# Patient Record
Sex: Female | Born: 1985 | Race: White | Hispanic: No | Marital: Married | Smoking: Never smoker
Health system: Southern US, Community
[De-identification: ages and names within clinical notes are randomized; demographics above are authoritative.]

## PROBLEM LIST (undated history)

## (undated) DIAGNOSIS — R112 Nausea with vomiting, unspecified: Secondary | ICD-10-CM

## (undated) DIAGNOSIS — C439 Malignant melanoma of skin, unspecified: Secondary | ICD-10-CM

## (undated) DIAGNOSIS — Z800 Family history of malignant neoplasm of digestive organs: Secondary | ICD-10-CM

## (undated) DIAGNOSIS — Z801 Family history of malignant neoplasm of trachea, bronchus and lung: Secondary | ICD-10-CM

## (undated) DIAGNOSIS — C50919 Malignant neoplasm of unspecified site of unspecified female breast: Secondary | ICD-10-CM

## (undated) DIAGNOSIS — Z1371 Encounter for nonprocreative screening for genetic disease carrier status: Secondary | ICD-10-CM

## (undated) DIAGNOSIS — Z9889 Other specified postprocedural states: Secondary | ICD-10-CM

## (undated) DIAGNOSIS — Z803 Family history of malignant neoplasm of breast: Secondary | ICD-10-CM

## (undated) HISTORY — PX: WISDOM TOOTH EXTRACTION: SHX21

## (undated) HISTORY — DX: Family history of malignant neoplasm of breast: Z80.3

## (undated) HISTORY — DX: Family history of malignant neoplasm of trachea, bronchus and lung: Z80.1

## (undated) HISTORY — DX: Family history of malignant neoplasm of digestive organs: Z80.0

---
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
## 2006-02-25 ENCOUNTER — Other Ambulatory Visit: Admission: RE | Admit: 2006-02-25 | Discharge: 2006-02-25 | Payer: Self-pay | Admitting: Family Medicine

## 2006-03-11 ENCOUNTER — Ambulatory Visit (HOSPITAL_COMMUNITY): Admission: RE | Admit: 2006-03-11 | Discharge: 2006-03-11 | Payer: Self-pay | Admitting: Family Medicine

## 2007-01-13 HISTORY — PX: OTHER SURGICAL HISTORY: SHX169

## 2007-10-14 DIAGNOSIS — C439 Malignant melanoma of skin, unspecified: Secondary | ICD-10-CM

## 2007-10-14 HISTORY — DX: Malignant melanoma of skin, unspecified: C43.9

## 2010-01-30 ENCOUNTER — Ambulatory Visit
Admission: RE | Admit: 2010-01-30 | Discharge: 2010-01-30 | Payer: Self-pay | Source: Home / Self Care | Attending: Sports Medicine | Admitting: Sports Medicine

## 2010-01-30 DIAGNOSIS — M629 Disorder of muscle, unspecified: Secondary | ICD-10-CM | POA: Insufficient documentation

## 2010-01-30 DIAGNOSIS — M25569 Pain in unspecified knee: Secondary | ICD-10-CM | POA: Insufficient documentation

## 2010-01-30 DIAGNOSIS — M2170 Unequal limb length (acquired), unspecified site: Secondary | ICD-10-CM | POA: Insufficient documentation

## 2010-01-30 DIAGNOSIS — R269 Unspecified abnormalities of gait and mobility: Secondary | ICD-10-CM | POA: Insufficient documentation

## 2010-02-13 NOTE — Assessment & Plan Note (Signed)
Summary: 3:15APP,NP,KNEE/NECK PAIN,MC   Vital Signs:  Patient profile:   25 year old female Height:      66 inches Weight:      133 pounds BMI:     21.54 Pulse rate:   64 / minute BP sitting:   100 / 68  (left arm)  Vitals Entered By: Rochele Pages RN (January 30, 2010 3:30 PM) CC: lt lateral knee pain, rt side neck pain   CC:  lt lateral knee pain and rt side neck pain.  History of Present Illness: R knee pain started during a run on Dec 23. Pain started after normal 3 miles. Ran a total of 4.6 miles. Pain for a few days afterwards. Rested for about 3 1/2 weeks. Tried to run again this past Sat but pain returned.   Normally runs 2-3 times/week, about 3 miles each time. Been a runner for about 2 years. No previous injuries.  Pain starts when she gets further into runs.  Able to get through Zumba class without significant pain. Weight bearing on R side causes much pain.  Meds: Relafen, Voltoren gel--No difference X-Rays: Normal  Preventive Screening-Counseling & Management  Alcohol-Tobacco     Smoking Status: quit  Social History: Smoking Status:  quit  Physical Exam  General:  Well-developed,well-nourished,in no acute distress; alert,appropriate and cooperative throughout examination Msk:  No gross abnormalities L Leg length=88.2cm R Leg length=90cm Mild TTP of R lateral knee joint R Leg Anterior Drawer test with laxity Mild R lateral knee joint effusion Negative patellar grind Negative patellofemoral tenderness Additional Exam:  When assessing patient while walking and jogging, mild L sided pelvic tilt was appreciated.   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) consistent with her findings and we can actually feel some edema under distal ITB today over femoral prominence  will use stretches and  exercises however big change seems to be leg length  Problem # 3:  UNEQUAL LEG LENGTH (ICD-736.81) will add lift to left this does improve and eliminate some of trendelenburg during gait since she gets RT neck and TRAP spasm I think she needs to use lift in reg shoes I did not see scoliosis or abnorm neck exam  Problem # 4:  ABNORMALITY OF GAIT (ICD-781.2) overall she has good runnign form however, leg length difference is enough to throw off gait to lead to trendelenburg and prob causing ITB sxs  may be candidate for custom orthotics going forward  reck 4 wks  Patient Instructions: 1)  Perform at least 2 of the stretching exercises 3 reps of 30 seconds each at least once a day. 2)  Perform the leg exercises 3 sets of 15 reps at least once a day. 3)  Return to running by starting off with 2 mile walk/runs, then gradually increase after 2 weeks. 4)  Briefly apply ice to affected area if experiencing pain after running or other exercises. 5)  Follow up in 1 month for re-evaluation.   Orders Added: 1)  New Patient Level III [36644]

## 2010-03-03 ENCOUNTER — Ambulatory Visit: Payer: Self-pay | Admitting: Sports Medicine

## 2010-03-19 ENCOUNTER — Encounter: Payer: Self-pay | Admitting: Sports Medicine

## 2010-03-19 ENCOUNTER — Ambulatory Visit (INDEPENDENT_AMBULATORY_CARE_PROVIDER_SITE_OTHER): Payer: 59 | Admitting: Sports Medicine

## 2010-03-19 DIAGNOSIS — M2170 Unequal limb length (acquired), unspecified site: Secondary | ICD-10-CM

## 2010-03-19 DIAGNOSIS — R269 Unspecified abnormalities of gait and mobility: Secondary | ICD-10-CM

## 2010-03-19 DIAGNOSIS — M629 Disorder of muscle, unspecified: Secondary | ICD-10-CM

## 2010-03-25 NOTE — Assessment & Plan Note (Signed)
Summary: F/U [facility]   Vital Signs:  Patient profile:   25 year old female Pulse rate:   67 / minute BP sitting:   119 / 82  (left arm)  Vitals Entered By: Rochele Pages RN (March 19, 2010 8:56 AM)  CC:  R knee pain.  History of Present Illness: [by Christin Gethers MSIV]  Ms. Name is here today for a F/U appointment for her R knee pain caused by ITBS and leg length discrepancy .  She reports that the shoe insert she recieved last visit has helped significantly. She has slowly started running again. Last week ran 3 miles and pushed herself past the point of pain and her ITBS flared up again. Patient denies experiencing pain during Zumba. She started taking fish oil and thinks it's been helping with her knee pain. The patient has been following the exercises and stretches that she received the last visit. Patient has decreased running to only 2 times a week. On occasion she uses ice after running, she does not take any antinflammatory medicine for the pain.  Problems Prior to Update: 1)  Abnormality of Gait  (ICD-781.2) 2)  Unequal Leg Length  (ICD-736.81) 3)  Itbs, Right Knee  (ICD-728.89) 4)  Knee Pain, Right  (ICD-719.46)  Review of Systems       per HPI  Physical Exam  General:  alert, normal appearance, healthy-appearing, and cooperative to examination.  alert, normal appearance, healthy-appearing, and cooperative to examination.   Msk:  Right Knee: Normal to inspection with no erythema or effusion or obvious bony abnormalities. Palpation normal with no warmth or joint line tenderness. ROM normal in flexion and extension. Ligaments with solid consistent endpoints including ACL & LCL. Negative Mcmurray's and provocative meniscal tests. Hip abduction 5/5. Leg length discrepancy: left leg is shorter than right leg.  note the edema noted at ITB on last visit has resolved  Extremities:  running gait shows overall good form when we correct for leg length.  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 Custom orthotics are being made today to help correct leg length discrepancy and should also help reduce her IT band pain Advised to continue with exercise and stretching program.  Orders: Garment,belt,sleeve or other covering ,elastic or similar stretch (Z6109) [employer], each unit (U0454)  Problem # 2:  UNEQUAL LEG LENGTH (ICD-736.81)  Reports significant improvement since her last visit. She is having custom orthotics made today to help correct for unequal leg length Observed running form with lift: form was good, with appropriate shifting of hips. Patient was fitted for a : standard, cushioned, semi-rigid orthotic. The orthotic was heated and afterward the patient stood on the orthotic blank positioned on the orthotic stand. The patient was positioned in subtalar neutral position and 10 degrees of ankle dorsiflexion in a weight bearing stance. After completion of molding, a stable base was applied to the orthotic blank. The blank was ground to a stable position for weight bearing. Size: 8 blue swirl Base: EVA on rt, 2 layers of EVA on lt to correct leg length difference Posting: none Additional orthotic padding: none  note that gait after orthotic prep looks very neutral and the inserts fell comfortable to patient  reck 2 to 3 months pending response  Orders: [employer], each unit (U9811)  Problem # 3:  ABNORMALITY OF  GAIT (ICD-781.2)  Orders: [employer], each unit [phone])   corrects well with lift for leg length difference with no trendelenburg change noted after this  Patient Instructions: 1)  Continue performing at least 2 of the stretching exercises 3 reps of 30 seconds each at least once a day. 2)  Continue performing the leg exercises 3 sets of 15 reps at least once a  day. 3)  Briefly apply ice to affected area if experiencing pain after running or other exercises. 4)  Wear orthotics in running shoes when exercising.   Orders Added: 1)  Garment,belt,sleeve or other covering ,elastic or similar stretch [A4466] 2)  Est. Patient Level IV [98119] 3)  Or[employer]otic [employer], each unit [L3002]

## 2011-08-10 ENCOUNTER — Ambulatory Visit (HOSPITAL_COMMUNITY)
Admission: RE | Admit: 2011-08-10 | Discharge: 2011-08-10 | Disposition: A | Discharge status: Home / Self Care | Payer: 59 | Source: Ambulatory Visit | Attending: Family Medicine | Admitting: Family Medicine

## 2011-08-10 DIAGNOSIS — M542 Cervicalgia: Secondary | ICD-10-CM | POA: Insufficient documentation

## 2011-08-10 DIAGNOSIS — IMO0001 Reserved for inherently not codable concepts without codable children: Secondary | ICD-10-CM | POA: Insufficient documentation

## 2011-08-10 DIAGNOSIS — M6281 Muscle weakness (generalized): Secondary | ICD-10-CM | POA: Insufficient documentation

## 2011-08-10 DIAGNOSIS — M7918 Myalgia, other site: Secondary | ICD-10-CM | POA: Insufficient documentation

## 2011-08-10 NOTE — Evaluation (Signed)
Physical Therapy Evaluation  Patient Details  Name: Stephanie [name] MRN: [id number] Date of Birth: [birth year]  Today's Date: 08/10/2011 Time: 1705-1750 PT Time Calculation (min): 45 min Charges: 1 eval, 10' Manual  Visit#: 1  of 8   Re-eval: 09/09/11 Assessment Diagnosis: Neck Pain Next MD Visit: Dr. Neita Carp - unscheduled Prior Therapy: None - chiropractic    Past Medical History: R shoulder melanoma (2009) Past Surgical History: R shoulder melanoma removal (2009)  Subjective Symptoms/Limitations Symptoms: Neck Pain, melenoma to L shoulder 2009 Pertinent History: Pt is referred to PT for chronic neck pain. She reports that when she had her melenoma removed in 2009 and was favoring her RUE and holding her R shoulder close to her body in order to keep stiches from ripping.  She has recieved help from a chiropractor without relief and has had 3 x-rays w/o significant findings. Her c/co's: difficulty turning her neck to talk to people, has multiple flare ups that makes her nausea, attacks last 1-2 days.  She reports increased anxiety over the past few years and has had increased headaches over the last month. She has recently changed jobs in her company which she reports has also increase Special Tests: - compression, - distraction.  Pt reports decreased pain with distraction technique, but does not report any radicular symptoms.  Pain Assessment Currently in Pain?: Yes Pain Score:   3 (Range: 3-10/10.  Worse with attacks.) Pain Type: Chronic pain Pain Onset: More than a month ago  Prior Functioning  Prior Function Driving: Yes Vocation: Full time employment Vocation Requirements: Full time working at the [employer] on the side.  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  Enjoys being outside.   Cognition/Observation Observation/Other Assessments Observations: Is aware of  posture during exam and continue to self correct Other Assessments: Decreaed levator scapula and UT muscle length  Sensation/Coordination/Flexibility/Functional Tests Coordination Gross Motor Movements are Fluid and Coordinated: No Coordination and Movement Description: Impaired coordination to R MT and LT region w/increased activiation of R UT  Assessment Cervical AROM Cervical Flexion: WNL Cervical Extension: WNL - no pain today, has increased pain during flare ups Cervical - Right Side Bend: 15 cm Cervical - Left Side Bend: 18 cm - increased stiffness Cervical - Right Rotation: WNL - no pain Cervical - Left Rotation: WNL - no pain Cervical Strength Overall Cervical Strength Comments: capital flexion 3+/5 Cervical Flexion: 3+/5 Cervical Extension: 4/5 Cervical - Right Side Bend: 4/5 Cervical - Left Side Bend: 3+/5 Cervical - Right Rotation: 4/5 Cervical - Left Rotation: 4/5 Palpation Palpation: Decreased PA mobility to C2, C4-C5 SP.  Increased fascial restrcitions to R and L suboccipital region, R scalenes and cervical paraspinal musculature.      Posture: No significant limitations   Exercise/Treatments Stretches Upper Trapezius Stretch: 1 rep;30 seconds Levator Stretch: 1 rep;30 seconds Seated Exercises Neck Retraction: 5 reps Supine Exercises Other Supine Exercise: Diaphragmatic breathing with manual techniques.   Manual Therapy Manual Therapy: Other (comment) Joint Mobilization: Grade II PA to C2, C4-5 Spinous process to decrease pain and improve mobility.  Myofascial Release: suboccipital release; R levator scapula and insertion of SCM.  Other Manual Therapy: Cervical traction   Physical Therapy Assessment and Plan PT Assessment and Plan Clinical Impression Statement: Pt is a 26 year old female referred to PT for chronic neck pain.  Pt reports decreased pain at end of session today.  Demonstrate independence and  appropriate coordination with chin tucks.  Pt will  benefit from skilled therapeutic intervention in order to improve on the following deficits: Pain;Increased muscle spasms;Increased fascial restricitons;Decreased strength;Decreased range of motion;Impaired flexibility;Decreased coordination Rehab Potential: Good PT Frequency: Min 2X/week PT Duration: 4 weeks PT Treatment/Interventions: Therapeutic activities;Modalities;Therapeutic exercise;Neuromuscular re-education;Patient/family education;Manual techniques (Traction modalities only.  Hx of cancer) PT Plan: Hx of cancer ( no electrical modalities).  Start with UBE, prone shoulder exercises, x-v, w backs, theraband, manual techniques,  attempt cervical traction if pt responded well today.     Goals Home Exercise Program Pt will Perform Home Exercise Program: Independently PT Goal: Perform Home Exercise Program - Progress: Goal set today PT Short Term Goals Time to Complete Short Term Goals: 4 weeks PT Short Term Goal 1: Pt will report pain less than 2/10 for 75% of her day for improved QOL. PT Short Term Goal 2: Pt will improve cervical and shoulder strength to [facility] in order to continue with hunting and work out activities without an increase in pain.  PT Short Term Goal 3: Pt will improve cervical SP PA mobility to decrease risk of pain.  PT Short Term Goal 4: Pt will improve cervical musculature muscle length/flexibility in order to have decreased pain with cervical rotation and L lateral side bending.  PT Short Term Goal 5: Pt will report headaches less than 1x/week.   Problem List Patient Active Problem List  Diagnosis  . Neck pain  . Musculoskeletal pain   PT - End of Session Activity Tolerance: Patient tolerated treatment well PT Plan of Care PT Home Exercise Plan: see scanned document.  PT Patient Instructions: Discess importance of HEP and diphragmatic breathing to decrease anxiety. Discussed changing computer position to have a slight downward gaze.  Consulted and Agree with  Plan of Care: Patient  Annett Fabian, PT 08/10/2011, 6:17 PM  Physician Documentation Your signature is required to indicate approval of the treatment plan as stated above.  Please sign and either send electronically or make a copy of this report for your files and return this physician signed original.   Please mark one 1.__approve of plan  2. ___approve of plan with the following conditions.   ______________________________                                                          _____________________ Physician Signature                                                                                                             Date

## 2011-08-12 ENCOUNTER — Inpatient Hospital Stay (HOSPITAL_COMMUNITY)
Admission: RE | Admit: 2011-08-12 | Discharge: 2011-08-12 | Payer: 59 | Source: Ambulatory Visit | Attending: Physical Therapy | Admitting: Physical Therapy

## 2011-08-12 NOTE — Progress Notes (Signed)
Physical Therapy Treatment Patient Details  Name: Stephanie [name] MRN: [id number] Date of Birth: [birth year]  Today's Date: 08/12/2011 Time: 0865-7846 PT Time Calculation (min): 38 min Visit#: 2  of 8   Re-eval: 09/09/11 Charges:  There ex 20', traction 12', MHP X 1    Subjective: Symptoms/Limitations Symptoms: Pt. states last treatment helped.  States she has been compliant with her exercises.  Currently 3/10 pain today. Pain Assessment Currently in Pain?: Yes Pain Score:   3 Pain Location: Neck  Precautions/Restrictions  Precautions Precautions: Other (comment) Precaution Comments: History of melanoma  Exercise/Treatments Machines for Strengthening UBE (Upper Arm Bike): 4'@ 1.0 Theraband Exercises Scapula Retraction: 10 reps;Green Shoulder Extension: 10 reps;Green Rows: 10 reps;Green Seated Exercises Neck Retraction: 10 reps X to V: 10 reps W Back: 10 reps   Modalities Modalities: Moist Heat;Traction Moist Heat Therapy Number Minutes Moist Heat: 12 Minutes Moist Heat Location:  (cervical) Traction Type of Traction: Cervical Min (lbs): n/a Max (lbs): 15 Hold Time: static hold for 12' Rest Time: n/a Time: 91'  Physical Therapy Assessment and Plan PT Assessment and Plan Clinical Impression Statement: Added UBE, postural tband exercises, W-backs and X-V all with good form/stabilization.  Began cervical traction today due to good results from manual last visit.  Pt. reported decreased pain at end of session today.   PT Plan: Progress to prone exercises; Assess pain next visit. No electrical modalities due to hx of cancer.     Problem List Patient Active Problem List  Diagnosis  . KNEE PAIN, RIGHT  . ITBS, RIGHT KNEE  . UNEQUAL LEG LENGTH  . ABNORMALITY OF GAIT  . Neck pain  . Musculoskeletal pain    PT - End of Session Activity Tolerance: Patient tolerated treatment well General Behavior During Session: [facility] for tasks performed Cognition: [facility] for tasks  performed   Lurena Nida, PTA/CLT 08/12/2011, 5:49 PM

## 2011-08-18 ENCOUNTER — Ambulatory Visit (HOSPITAL_COMMUNITY)
Admission: RE | Admit: 2011-08-18 | Discharge: 2011-08-18 | Disposition: A | Discharge status: Home / Self Care | Payer: 59 | Source: Ambulatory Visit | Attending: Family Medicine | Admitting: Family Medicine

## 2011-08-18 DIAGNOSIS — M542 Cervicalgia: Secondary | ICD-10-CM | POA: Insufficient documentation

## 2011-08-18 DIAGNOSIS — M6281 Muscle weakness (generalized): Secondary | ICD-10-CM | POA: Insufficient documentation

## 2011-08-18 DIAGNOSIS — IMO0001 Reserved for inherently not codable concepts without codable children: Secondary | ICD-10-CM | POA: Insufficient documentation

## 2011-08-18 NOTE — Progress Notes (Signed)
Physical Therapy Treatment Patient Details  Name: Stephanie [name] MRN: [id number] Date of Birth: [birth year]  Today's Date: 08/18/2011 Time: 4098-1191 PT Time Calculation (min): 46 min Visit#: 3  of 8   Re-eval: 09/09/11 Charges:  therex 22', traction 15', MHP X 1   Subjective: Symptoms/Limitations Symptoms: Pt. states the traction helped.  Currently without pain.  States she has a little discomfort later in the day.  States she got up potatoes without increased pain and has continued her regular zumba classes.   Exercise/Treatments Machines for Strengthening UBE (Upper Arm Bike): 4'@ 1.0 Theraband Exercises Scapula Retraction: 10 reps;Green Shoulder Extension: 10 reps;Green Rows: 10 reps;Green Seated Exercises Neck Retraction: 10 reps X to V: 10 reps W Back: 10 reps Prone Exercises Shoulder Extension: 10 reps Rows: 10 reps Upper Extremity Flexion with Stabilization: 10 reps;Limitations UE Flexion with Stabilization Limitations: alternating   Modalities Modalities: Moist Heat;Traction Moist Heat Therapy Number Minutes Moist Heat: 15 Minutes Traction Type of Traction: Cervical Min (lbs): n/a Max (lbs): 18 Hold Time: static hold for 15' Rest Time: n/a Time: 37'  Physical Therapy Assessment and Plan PT Assessment and Plan Clinical Impression Statement: Added prone stab exercises all with good form/stab.  Pt. continuing to improve with overall decreased pain.  Increased max pull of traction to 18# today with good results.  No pain at end of session. PT Plan: continue per POC.  No electrical modalities due to hx of cancer.     Problem List Patient Active Problem List  Diagnosis  . KNEE PAIN, RIGHT  . ITBS, RIGHT KNEE  . UNEQUAL LEG LENGTH  . ABNORMALITY OF GAIT  . Neck pain  . Musculoskeletal pain    Lurena Nida, PTA/CLT 08/18/2011, 4:56 PM

## 2011-08-20 ENCOUNTER — Ambulatory Visit (HOSPITAL_COMMUNITY)
Admission: RE | Admit: 2011-08-20 | Discharge: 2011-08-20 | Disposition: A | Discharge status: Home / Self Care | Payer: 59 | Source: Ambulatory Visit | Attending: Physical Therapy | Admitting: Physical Therapy

## 2011-08-20 NOTE — Progress Notes (Signed)
Physical Therapy Treatment Patient Details  Name: Stephanie [name] MRN: [id number] Date of Birth: [birth year]  Today's Date: 08/20/2011 Time: 4098-1191 PT Time Calculation (min): 55 min Visit#: 4  of 8   Re-eval: 09/09/11 Charges:  therex 14', massage 10', cervical traction with MHP X 1     Subjective: Symptoms/Limitations Symptoms: Pt. states her neck is hurting today, states she took a longer run (3.5 miles) and has been doing alot of exercise past couple of days.  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  Pain Assessment Currently in Pain?: Yes Pain Score:   4 Pain Location: Neck Pain Orientation: Right   Exercise/Treatments Machines for Strengthening UBE (Upper Arm Bike): 4'@ 1.0 Theraband Exercises Scapula Retraction: 15 reps;Green Shoulder Extension: 15 reps;Green Rows: 15 reps;Green Prone Exercises Shoulder Extension: 10 reps Rows: 10 reps Upper Extremity Flexion with Stabilization: 10 reps;Limitations UE Flexion with Stabilization Limitations: alternating   Modalities Manual Therapy: Massage Massage: To R upper trap, seated  Moist Heat Therapy Number Minutes Moist Heat: 15 Minutes Moist Heat Location:  (cervical with traction) Traction Type of Traction: Cervical Min (lbs): n/a Max (lbs): 18 Hold Time: static hold for 15' Rest Time: n/a Time: 15  Physical Therapy Assessment and Plan PT Assessment and Plan Clinical Impression Statement: Pt. with large spasm palpated/resolved with STM in R upper trap mm.  Trigger point radiated pain into anterior chest area.  No pain at end of session.  PT Plan: Continue per POC. Progress to planks/weights with prone.  No electrical modalities due to hx of cancer     Problem List Patient Active Problem List  Diagnosis  . KNEE PAIN, RIGHT  . ITBS, RIGHT KNEE  . UNEQUAL LEG LENGTH  . ABNORMALITY OF GAIT  . Neck pain  . Musculoskeletal pain    PT - End of Session Activity Tolerance: Patient tolerated treatment  well General Behavior During Session: [facility] for tasks performed Cognition: [facility] for tasks performed   Stephanie [name], PTA/CLT 08/20/2011, 5:35 PM

## 2011-08-24 ENCOUNTER — Ambulatory Visit (HOSPITAL_COMMUNITY)
Admission: RE | Admit: 2011-08-24 | Discharge: 2011-08-24 | Disposition: A | Discharge status: Home / Self Care | Payer: 59 | Source: Ambulatory Visit | Attending: Family Medicine | Admitting: Family Medicine

## 2011-08-24 NOTE — Progress Notes (Signed)
Physical Therapy Treatment Patient Details  Name: Stephanie [name] MRN: [id number] Date of Birth: 1986/01/02  Today's Date: 08/24/2011 Time: 1478-2956 PT Time Calculation (min): 44 min Charges: 10' Manual, 10' Self Care, 24' TE Visit#: 5  of 8   Re-eval: 09/09/11  Subjective: Symptoms/Limitations Symptoms: I am feeling much better than the other day.  She worked it out well.  I have been to work a little this morning and I am feeling good.  My biggest problem is holding my neck to the R and talking for awhile.  Pain Assessment Currently in Pain?: No/denies  Exercise/Treatments Machines for Strengthening UBE (Upper Arm Bike): 6' @ 3.0 Theraband Exercises Scapula Retraction: 15 reps;Blue Shoulder Extension: 20 reps;Blue Rows: 20 reps;Blue Shoulder External Rotation: 10 reps;Blue (B UE) Horizontal ADduction: 10 reps;Blue (B UE) Horizontal ABduction: 10 reps;Blue (B UE) Standing Exercises Thumb Tacks: 10x  Other Standing Exercises: 4 way scap stabalization x10 Seated Exercises X to V: 15 reps W Back: 15 reps  Manual Therapy Manual Therapy: Joint mobilization Joint Mobilization: Grade II-III PA to C4-5 Spinous process to decrease pain and improve mobility Other Manual Therapy: MET to correct C4 R rotation w/100% correction w/STM after to decrease pain  Physical Therapy Assessment and Plan PT Assessment and Plan Clinical Impression Statement: Added exercises to improve shoulder/scapula stabilzation while working on proper posture.  Educated pt on 2 different towel roll techniques to help with her posture.  Pt had improved spinal alignment after manual techniques today.  PT Plan: Continue per POC. Progress to planks/weights with prone.  No electrical modalities due to hx of cancer    Goals    Problem List Patient Active Problem List  Diagnosis  . KNEE PAIN, RIGHT  . ITBS, RIGHT KNEE  . UNEQUAL LEG LENGTH  . ABNORMALITY OF GAIT  . Neck pain  . Musculoskeletal pain    PT -  End of Session Activity Tolerance: Patient tolerated treatment well General Behavior During Session: [facility] for tasks performed Cognition: [facility] for tasks performed PT Plan of Care PT Home Exercise Plan: See updated report PT Patient Instructions: Educated on towel rolls to improve posture by the end of the day.  Consulted and Agree with Plan of Care: Patient  GP    Stephanie [name] 08/24/2011, 9:51 AM

## 2011-08-26 ENCOUNTER — Ambulatory Visit (HOSPITAL_COMMUNITY)
Admission: RE | Admit: 2011-08-26 | Discharge: 2011-08-26 | Disposition: A | Discharge status: Home / Self Care | Payer: 59 | Source: Ambulatory Visit | Attending: Family Medicine | Admitting: Family Medicine

## 2011-08-26 NOTE — Progress Notes (Signed)
Physical Therapy Treatment Patient Details  Name: Stephanie [name] MRN: [id number] Date of Birth: 11/04/1985  Today's Date: 08/26/2011 Time: 4098-1191 PT Time Calculation (min): 32 min Charges: 98' TE Visit#: 6  of 8   Re-eval: 09/09/11    Subjective: Symptoms/Limitations Symptoms: I can tell I am getting better.  I was not as achy after zumba as I have been.  I can tell my neck and shoulders are getting stronger.  Pain Assessment Currently in Pain?: No/denies  Precautions/Restrictions   Hx of Cancer  Exercise/Treatments Machines for Strengthening UBE (Upper Arm Bike): 6' @ 3.0 Standing Exercises Thumb Tacks: 15x Other Standing Exercises: 4 way scap stabalization x12 Sidelying Exercises Other Sidelying Exercise: Planks 2x10 sec holds w/VC's and TC's for proper cervical alignment Prone Exercises Shoulder Extension: 15 reps (palms up) Rows: 15 reps;Weights Rows Weights (lbs): 3 Upper Extremity Flexion with Stabilization: 10 reps UE Flexion with Stabilization Limitations: bilateral Plank: w/alteration arm lifts (elbow remained bent) 2x10 Other Prone Exercise: POE: R and L rotation x10; D1 pattern bilateral x10, SA x10    Physical Therapy Assessment and Plan PT Assessment and Plan Clinical Impression Statement: Pt able to tolerate all new exercises without increased pain and with requiring min-mod cueing for proper cervical posture.  Did not require manual treatment.   PT Plan: Continue to progress strength.  No electrical modalities due to hx of cancer    Goals    Problem List Patient Active Problem List  Diagnosis  . KNEE PAIN, RIGHT  . ITBS, RIGHT KNEE  . UNEQUAL LEG LENGTH  . ABNORMALITY OF GAIT  . Neck pain  . Musculoskeletal pain    PT - End of Session Activity Tolerance: Patient tolerated treatment well General Behavior During Session: [facility] for tasks performed Cognition: [facility] for tasks performed PT Plan of Care PT Home Exercise Plan: See updated report PT  Patient Instructions: Educated on towel rolls to improve posture by the end of the day.  Consulted and Agree with Plan of Care: Patient  Annett Fabian, PT 08/26/2011, 5:48 PM

## 2011-08-31 ENCOUNTER — Ambulatory Visit (HOSPITAL_COMMUNITY)
Admission: RE | Admit: 2011-08-31 | Discharge: 2011-08-31 | Disposition: A | Discharge status: Home / Self Care | Payer: 59 | Source: Ambulatory Visit | Attending: Family Medicine | Admitting: Family Medicine

## 2011-08-31 NOTE — Evaluation (Addendum)
Physical Therapy RE-Evaluation  Patient Details  Name: Stephanie [name] MRN: [id number] Date of Birth: [birth year]  Today's Date: 08/31/2011 Time: 4098-1191 PT Time Calculation (min): 40 min Charges: 1 ROM, 1 MMT, 25' TE, 10' Manual Visit#: 7  of 8   Re-eval: 09/30/11 Assessment Diagnosis: Neck Pain Next MD Visit: Dr. Neita Carp - unscheduled  Subjective Symptoms/Limitations Symptoms: This is the longest I have gone between flare ups and I am not having as much achiness as I used to.  I can tell it is gettting stronger.   Precautions/Restrictions  Precautions Precaution Comments: History of melanoma  Assessment Cervical AROM Cervical Flexion: WNL Cervical Extension: WNL - no pain today, has increased pain during flare ups Cervical - Right Side Bend: WNL - no pain Cervical - Left Side Bend: WNL - no pain Cervical - Right Rotation: WNL - no pain Cervical - Left Rotation: WNL - no pain Cervical Strength Overall Cervical Strength Comments: capital flexion 5/5 Cervical Flexion: 5/5 (3+/5) Cervical Extension: 5/5 (4/5) Cervical - Right Side Bend: 5/5 (4/5) Cervical - Left Side Bend: 5/5 (3+/5) Cervical - Right Rotation: 5/5 (4/5) Cervical - Left Rotation: 5/5 (4/5) Palpation Palpation: Continues to have moderate spasms to B UT and middle scalenes   Exercise/Treatments Machines for Strengthening UBE (Upper Arm Bike): 6' @ 3.0 Theraband Exercises Other Theraband Exercises: D1 and D2 patterns blue t-band BUE x10 each Prone Exercises Shoulder Extension: 10 reps (on grn ball) Rows: 10 reps (on grn ball) Upper Extremity Flexion with Stabilization: 10 reps (on grn ball) UE Flexion with Stabilization Limitations: shoulde abduction x10 on grn ball Plank: Planks1x5 w/ elbow elevation onto airex Other Prone Exercise: Push up on grn ball x10  Manual Therapy Manual Therapy: Joint mobilization Joint Mobilization: Grade II-III PA to C4-5 Spinous process to decrease pain and improve mobility    Myofascial Release: to R and L UT and scalene region to decrease spasms  Physical Therapy Assessment and Plan PT Assessment and Plan Clinical Impression Statement: Re-eval complete.  Will keep pt chart open until 09/30/10.  Stephanie [name] has attended 7 OP PT visits for chronic neck pain with the following findings: improved percieved functional abiliyt, decreased soreness and achyness (by report), increased cervical strength, improved posture, recieved education on proper posture and corrections during work and activiites.   PT Frequency: Min 2X/week PT Duration: 4 weeks PT Plan: Pt will continue on her own and will call back if she needs to continue with therapy.      Goals Home Exercise Program Pt will Perform Home Exercise Program: Independently PT Goal: Perform Home Exercise Program - Progress: Met PT Short Term Goals Time to Complete Short Term Goals: 4 weeks PT Short Term Goal 1: Pt will report pain less than 2/10 for 75% of her day for improved QOL. PT Short Term Goal 1 - Progress: Met PT Short Term Goal 2: Pt will improve cervical and shoulder strength to [facility] in order to continue with hunting and work out activities without an increase in pain.  PT Short Term Goal 2 - Progress: Met PT Short Term Goal 3: Pt will improve cervical SP PA mobility to decrease risk of pain.  PT Short Term Goal 3 - Progress: Met PT Short Term Goal 4: Pt will improve cervical musculature muscle length/flexibility in order to have decreased pain with cervical rotation and L lateral side bending.  PT Short Term Goal 4 - Progress: Met PT Short Term Goal 5: Pt will report headaches less than 1x/week.  PT Short Term Goal 5 - Progress: Met  Problem List Patient Active Problem List  Diagnosis  . KNEE PAIN, RIGHT  . ITBS, RIGHT KNEE  . UNEQUAL LEG LENGTH  . ABNORMALITY OF GAIT  . Neck pain  . Musculoskeletal pain    PT Plan of Care PT Patient Instructions: Educated on importance of posture with all  activties.  Consulted and Agree with Plan of Care: Patient  Annett Fabian, PT 08/31/2011, 12:22 PM  Physician Documentation Your signature is required to indicate approval of the treatment plan as stated above.  Please sign and either send electronically or make a copy of this report for your files and return this physician signed original.   Please mark one 1.__approve of plan  2. ___approve of plan with the following conditions.   ______________________________                                                          _____________________ Physician Signature                                                                                                             Date

## 2014-03-26 ENCOUNTER — Ambulatory Visit (HOSPITAL_COMMUNITY): Payer: BLUE CROSS/BLUE SHIELD | Attending: Family Medicine | Admitting: Physical Therapy

## 2014-03-26 ENCOUNTER — Encounter (HOSPITAL_COMMUNITY): Payer: Self-pay | Admitting: Physical Therapy

## 2014-03-26 DIAGNOSIS — R29898 Other symptoms and signs involving the musculoskeletal system: Secondary | ICD-10-CM

## 2014-03-26 DIAGNOSIS — M5382 Other specified dorsopathies, cervical region: Secondary | ICD-10-CM

## 2014-03-26 DIAGNOSIS — M542 Cervicalgia: Secondary | ICD-10-CM

## 2014-03-26 DIAGNOSIS — M6281 Muscle weakness (generalized): Secondary | ICD-10-CM | POA: Diagnosis not present

## 2014-03-26 NOTE — Patient Instructions (Signed)
SCAPULA: Retraction   Hold cane with both hands. Pinch shoulder blades together. Do not shrug shoulders. Hold _3__ seconds. Use__0_ lb weight on cane.  AROM: Neck Flexion   Bend head forward. Hold __0__ seconds. Repeat __10__ times per set. Do __1__ sets per session. Do __2AROM: Lateral Neck Flexion   Slowly tilt head toward one shoulder, then the other. Hold each position __0__ seconds. Repeat _10___ times per set. Do __1__ sets per session. Do _2AROM: Neck Rotation   Turn head slowly to look over one shoulder, then the other. Hold each position _0___ seconds. Repeat __10__ times per set. Do __1__ sets per session. Do __2__ sessions per day.  Chin Protraction / Retraction   Slide head forward keeping chin level. Slide head back, pulling chin in. Hold each position _0__ seconds. Repeat _10__ times. Do _2__ sessions per day.  Copyright  VHI. All rights reserved.

## 2014-03-26 NOTE — Therapy (Signed)
[facility] [address] [place], [place], [address] Phone: [phone]   Fax:  [phone]  Physical Therapy Evaluation  Patient Details  Name: Stephanie [name] MRN: [id number] Date of Birth: 27-Nov-1985 Referring Provider:  Caryl Bis, MD  Encounter Date: 03/26/2014      PT End of Session - 03/26/14 1604    Visit Number 1   Number of Visits 12   Date for PT Re-Evaluation 04/16/14   Authorization Type BCBS   Authorization Time Period 03/26/14 to 05/26/14   Activity Tolerance Patient tolerated treatment well   Behavior During Therapy [facility] for tasks assessed/performed      No past medical history on file.  No past surgical history on file.  There were no vitals filed for this visit.  Visit Diagnosis:  Neck pain - Plan: PT plan of care cert/re-cert  Weakness of shoulder - Plan: PT plan of care cert/re-cert  Weakness of neck - Plan: PT plan of care cert/re-cert      Subjective Assessment - 03/26/14 1528    Symptoms Patient usually has 5/10; anything overhead cuases neck strain; past deep tissue has caused problems. Aches on a regular day; when patient has a bad day, much more intense. Has to almost "lift head up" to relieve pain.    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 The day the stitches were taken out, the pain started that evening and patient reported to ER.  Patient has had therapy for neck before with this clinic; has also tried chiropractors but did NOT enjoy their treatment. During flare-ups, patient would go and try to find good chiro.    Currently in Pain? Yes   Pain Score 3   best as it gets   Pain Location Neck   Aggravating Factors  being in awkward position, overhead work   Pain Relieving Factors putting traction on neck; deep tissue massage            [facility] PT Assessment - 03/26/14 0001     Assessment   Medical Diagnosis Neck pain   Onset Date 11/26/07   Prior Therapy Has recieved therapy for this problem at this clinic before   Balance Screen   Has the patient fallen in the past 6 months No   Has the patient had a decrease in activity level because of a fear of falling?  No  but more mindful of certain activities   Is the patient reluctant to leave their home because of a fear of falling?  No   Observation/Other Assessments   Focus on Therapeutic Outcomes (FOTO)  37% limited   Sensation   Additional Comments No numbness/tingling in bilateral upper extremities   Posture/Postural Control   Posture Comments Good posture during eval, however patient states that she does have a tendency for poor posture at work and at gym   AROM   Right Shoulder Flexion 160 Degrees   Right Shoulder ABduction 170 Degrees   Left Shoulder Flexion 163 Degrees   Left Shoulder ABduction 175 Degrees   Cervical Flexion 68   Cervical Extension 50   Cervical - Right Side Bend 34   Cervical - Left Side Bend 35   Cervical - Right Rotation 62   Cervical - Left Rotation 50   Strength   Right Shoulder Flexion 4/5   Right Shoulder ABduction 4/5   Right  Shoulder Internal Rotation 4/5   Right Shoulder External Rotation 4/5   Left Shoulder Flexion 4/5   Left Shoulder ABduction 4/5   Left Shoulder Internal Rotation 4/5   Left Shoulder External Rotation 4/5   Palpation   Palpation C3-C5 tender to palpation; overall cervical spine feels very rigid with P-A mobilizations and feels to have reduced curve. Significant muscle tightness noted in upper trap muscles and to some extent cervical extensors.                            PT Education - 03/26/14 1532    Education provided Yes   Education Details Prognosis, HEP, PT plan of care moving forward, demonstrated and corrected for good form at gym; also cautioned patient to reduce reps/weight at gym if increased pain during or after  exercises even with corrected form   Person(s) Educated Patient   Methods Explanation   Comprehension Verbalized understanding;Returned demonstration          PT Short Term Goals - 03/26/14 1606    PT SHORT TERM GOAL #1   Title Patient will demonstrate good postural awareness at work and during functional exercises in order to reduce postural dysfunction and related neck pain   Time 3   Period Weeks   Status New   PT SHORT TERM GOAL #2   Title Patient will demonstrate full range of motion through all cervical planes in order to reduce pain and improve function during tasks such as driving   Time 3   Period Weeks   Status New   PT SHORT TERM GOAL #3   Title Patient will demonstrate reduced muscle spasms as evidenced by reduced need for manual mobilization of muscle spasms in cervical spine and upper traps   Time 3   Period Weeks   Status New           PT Long Term Goals - 03/26/14 1607    PT LONG TERM GOAL #1   Title Patient will consistently and correctly perform appropriate HEP in order to assist in managing condition and reducing overall pain   Time 6   Period Weeks   Status New   PT LONG TERM GOAL #2   Title Patient will demonstrate at least 4+/5 strengh in cervical and shoulder musculature as well as shoulder girdle stabilizers in order to promote stablity within general region   Time 6   Period Weeks   Status New   PT LONG TERM GOAL #3   Title Patient will be able to perform full workout at the gym without neck pain and with good form throughout in order to reduce stress and strain on cervical area during functional tasks   Time 6   Period Weeks   Status New   PT LONG TERM GOAL #4   Title Patient will report the ability to perform a full day of functional work tasks with good posture and neck pain 0/10 throughout, good self-monitoring strategies for postural adjustments   Time 6   Period Weeks   Status New               Plan - 03/26/14 1605     Clinical Impression Statement Patient presents with ongoing neck pain with origin in November of 2009, which appeared overnight after suture removal from her shoulder. Patient has received therapy for this condition in the past as well as chiropractic treatments; however patient reports that she was fairly  unhappy with chiropractic treatments. Patient presents on this date with reduced cervical range of motion, increased cervical pain, reduced joint mobility in cervical region, impaired posture, and reduced ability to participate in functional tasks. She will benefit from skilled PT services in order to address these deficits and also so taht she may reach an optimal level of function.    Pt will benefit from skilled therapeutic intervention in order to improve on the following deficits Increased muscle spasms;Improper body mechanics;Decreased strength;Postural dysfunction;Decreased range of motion;Pain;Decreased coordination;Decreased safety awareness   Rehab Potential Good   PT Frequency 2x / week   PT Duration 6 weeks   PT Treatment/Interventions ADLs/Self Care Home Management;Traction;Neuromuscular re-education;Ultrasound;Functional mobility training;Therapeutic activities;Electrical Stimulation;Therapeutic exercise;Manual techniques   PT Next Visit Plan Shoulder and cervical stabilization; manual therapy as appropriate; work on form for exercises in gym   PT [place] given   Consulted and Agree with Plan of Care Patient         Problem List Patient Active Problem List   Diagnosis Date Noted  . Neck pain 08/10/2011  . Musculoskeletal pain 08/10/2011  . KNEE PAIN, RIGHT 01/30/2010  . 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 UNEQUAL LEG LENGTH 01/30/2010  . ABNORMALITY OF GAIT 01/30/2010   Deniece Ree PT, DPT [facility] [address] [place], [place], [address] Phone: [phone]   Fax:  [phone]

## 2014-03-28 ENCOUNTER — Ambulatory Visit (HOSPITAL_COMMUNITY): Payer: BLUE CROSS/BLUE SHIELD

## 2014-03-28 DIAGNOSIS — M542 Cervicalgia: Secondary | ICD-10-CM

## 2014-03-28 DIAGNOSIS — M5382 Other specified dorsopathies, cervical region: Secondary | ICD-10-CM

## 2014-03-28 DIAGNOSIS — R29898 Other symptoms and signs involving the musculoskeletal system: Secondary | ICD-10-CM

## 2014-03-28 NOTE — Patient Instructions (Signed)
Flexibility: Upper Trapezius Stretch   Gently grasp right side of head while reaching behind back with other hand. Tilt head away until a gentle stretch is felt. Hold 30 seconds. Repeat 3 times per set. Do 3  sets per session. Do 1-2 sessions per day.  http://orth.exer.us/341   Copyright  VHI. All rights reserved.    Levator Scapula Stretch, Sitting   Sit, one hand tucked under hip on side to be stretched, other hand over top of head. Turn head toward other side and look down. Use hand on head to gently stretch neck in that position. Hold 30 seconds. Repeat 3  times per session. Do 1-2  sessions per day.  Copyright  VHI. All rights reserved.

## 2014-03-28 NOTE — Therapy (Signed)
[facility] [place], [place], [address] Phone: [phone]   Fax:  [phone]  Physical Therapy Treatment  Patient Details  Name: Stephanie [name] MRN: [id number] Date of Birth: [birth year] Referring Provider:  Caryl Bis, MD  Encounter Date: 03/28/2014      PT End of Session - 03/28/14 1107    Visit Number 2   Number of Visits 12   Date for PT Re-Evaluation 04/16/14   Authorization Type BCBS   Authorization Time Period 03/26/14 to 05/26/14   PT Start Time 1102   PT Stop Time 1153   PT Time Calculation (min) 51 min   Activity Tolerance Patient tolerated treatment well   Behavior During Therapy [facility] for tasks assessed/performed      No past medical history on file.  No past surgical history on file.  There were no vitals filed for this visit.  Visit Diagnosis:  Neck pain  Weakness of shoulder  Weakness of neck      Subjective Assessment - 03/28/14 1105    Symptoms Pt stated compliance with HEP without questions.  No reports of pain today just stiffness   Currently in Pain? No/denies            [facility] PT Assessment - 03/28/14 0001    Assessment   Medical Diagnosis Neck pain   Onset Date 11/26/07   Next MD Visit [facility] Masse Kamariah 2016   Prior Therapy Has recieved therapy for this problem at this clinic before                   [facility] Adult PT Treatment/Exercise - 03/28/14 0001    Exercises   Exercises Neck   Neck Exercises: Theraband   Scapula Retraction 10 reps;Green   Shoulder Extension 10 reps;Green   Rows 10 reps;Green   Neck Exercises: Standing   Other Standing Exercises 3D dowel rod pendelum   Neck Exercises: Seated   Neck Retraction 10 reps;5 secs   Other Seated Exercise 3D cervical and thoracic excursion 10x each   Manual Therapy   Manual Therapy Massage   Massage In supine: Manual position release for Bil Upper traps f/b STM to posterior cervical region, upper traps and  levator scapula, gentle suboccipital release and gentre manual traction   Neck Exercises: Stretches   Upper Trapezius Stretch 3 reps;30 seconds   Upper Trapezius Stretch Limitations Bil   Levator Stretch 3 reps;30 seconds                PT Education - 03/28/14 1208    Education provided Yes   Education Details Importance of proper posture; proper desk alignment with desk and keyboard, encouraged to drink water to reduce headache following manual   Person(s) Educated Patient   Methods Explanation   Comprehension Verbalized understanding;Returned demonstration          PT Short Term Goals - 03/28/14 1254    PT SHORT TERM GOAL #1   Title Patient will demonstrate good postural awareness at work and during functional exercises in order to reduce postural dysfunction and related neck pain   Status On-going   PT SHORT TERM GOAL #2   Title Patient will demonstrate full range of motion through all cervical planes in order to reduce pain and improve function during tasks such as driving   Status On-going   PT SHORT TERM GOAL #3   Title Patient will demonstrate reduced muscle spasms as evidenced by reduced need for  manual mobilization of muscle spasms in cervical spine and upper traps   Status On-going           PT Long Term Goals - 03/28/14 1255    PT LONG TERM GOAL #1   Title Patient will consistently and correctly perform appropriate HEP in order to assist in managing condition and reducing overall pain   PT LONG TERM GOAL #2   Title Patient will demonstrate at least 4+/5 strengh in cervical and shoulder musculature as well as shoulder girdle stabilizers in order to promote stablity within general region   PT LONG TERM GOAL #3   Title Patient will be able to perform full workout at the gym without neck pain and with good form throughout in order to reduce stress and strain on cervical area during functional tasks   PT LONG TERM GOAL #4   Title Patient will report the  ability to perform a full day of functional work tasks with good posture and neck pain 0/10 throughout, good self-monitoring strategies for postural adjustments               Plan - 03/28/14 1224    Clinical Impression Statement Began PT session with cerival and thoracic mobilty exercises to reduce stiffness.  Progressed to postural strengthening exercises with min cueing for form and to reduce forward head.  Ended session with manual position release to Bil Upper Trapezius to reduce spasms and improve cervical AROM as well as manual STM to upper traps, levator scapua and posterior cervical region.  Pt encouiraged to stay hydrated to reduce risk of headache following manual.   PT Next Visit Plan Shoulder and cervical stabilization; manual therapy as appropriate; work on form for exercises in gym        Problem List Patient Active Problem List   Diagnosis Date Noted  . Neck pain 08/10/2011  . Musculoskeletal pain 08/10/2011  . KNEE PAIN, RIGHT 01/30/2010  . 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 UNEQUAL LEG LENGTH 01/30/2010  . ABNORMALITY OF GAIT 01/30/2010   Ihor Austin, [place]  Aldona Lento 03/28/2014, 12:56 PM  [facility] [address] [place], [place], [address] Phone: [phone]   Fax:  [phone]

## 2014-04-04 ENCOUNTER — Ambulatory Visit (HOSPITAL_COMMUNITY): Payer: BLUE CROSS/BLUE SHIELD

## 2014-04-04 DIAGNOSIS — M5382 Other specified dorsopathies, cervical region: Secondary | ICD-10-CM

## 2014-04-04 DIAGNOSIS — M542 Cervicalgia: Secondary | ICD-10-CM | POA: Diagnosis not present

## 2014-04-04 DIAGNOSIS — R29898 Other symptoms and signs involving the musculoskeletal system: Secondary | ICD-10-CM

## 2014-04-04 NOTE — Therapy (Signed)
[facility] [address] [place], [place], [address] Phone: [phone]   Fax:  [phone]  Physical Therapy Treatment  Patient Details  Name: Stephanie [name] MRN: [id number] Date of Birth: 07/30/1985 Referring Provider:  Caryl Bis, MD  Encounter Date: 04/04/2014      PT End of Session - 04/04/14 1101    Visit Number 3   Number of Visits 12   Date for PT Re-Evaluation 04/16/14   Authorization Type BCBS   Authorization Time Period 03/26/14 to 05/26/14   PT Start Time 1057   PT Stop Time 1153   PT Time Calculation (min) 56 min   Activity Tolerance Patient tolerated treatment well   Behavior During Therapy [facility] for tasks assessed/performed      No past medical history on file.  No past surgical history on file.  There were no vitals filed for this visit.  Visit Diagnosis:  Neck pain  Weakness of shoulder  Weakness of neck      Subjective Assessment - 04/04/14 1058    Symptoms Pt stated she is pain free today, no longer c/o dull acheness.  Has adjusted desk and reports more awareness with posture during stressful days at work.     Currently in Pain? No/denies                       [facility] Adult PT Treatment/Exercise - 04/04/14 1109    Exercises   Exercises Neck   Neck Exercises: Theraband   Scapula Retraction 15 reps;Green   Scapula Retraction Limitations HEP   Shoulder Extension 15 reps;Green   Shoulder Extension Limitations HEP   Rows 15 reps;Green   Rows Limitations HEP   Neck Exercises: Seated   Neck Retraction 10 reps;5 secs   W Back 10 reps   Other Seated Exercise 3D cervical and thoracic excursion 10x each   Manual Therapy   Manual Therapy Massage   Massage In supine: Manual position release for Bil Upper traps and Scalenes f/b STM to posterior cervical region, upper traps and levator scapula, gentle suboccipital release and gentre manual traction   Neck Exercises: Stretches   Upper Trapezius  Stretch 3 reps;30 seconds   Upper Trapezius Stretch Limitations Bil   Levator Stretch 3 reps;30 seconds                  PT Short Term Goals - 04/04/14 1101    PT SHORT TERM GOAL #1   Title Patient will demonstrate good postural awareness at work and during functional exercises in order to reduce postural dysfunction and related neck pain   Status On-going   PT SHORT TERM GOAL #2   Title Patient will demonstrate full range of motion through all cervical planes in order to reduce pain and improve function during tasks such as driving   Status On-going   PT SHORT TERM GOAL #3   Title Patient will demonstrate reduced muscle spasms as evidenced by reduced need for manual mobilization of muscle spasms in cervical spine and upper traps   Status On-going           PT Long Term Goals - 04/04/14 1103    PT LONG TERM GOAL #1   Title Patient will consistently and correctly perform appropriate HEP in order to assist in managing condition and reducing overall pain   Status On-going   PT LONG TERM GOAL #2   Title Patient will demonstrate at least 4+/5 strengh  in cervical and shoulder musculature as well as shoulder girdle stabilizers in order to promote stablity within general region   Status On-going   PT LONG TERM GOAL #3   Title Patient will be able to perform full workout at the gym without neck pain and with good form throughout in order to reduce stress and strain on cervical area during functional tasks   PT LONG TERM GOAL #4   Title Patient will report the ability to perform a full day of functional work tasks with good posture and neck pain 0/10 throughout, good self-monitoring strategies for postural adjustments               Plan - 04/04/14 1127    Clinical Impression Statement Pt demonstrated improved awareness of proper posture with no cueing required to relax upper traps through session and able to demonstrate appropriate form and technique with all exercises.   Pt given neck stretches and theraband with printout to add to HEP.  Added wback for scapular strengthening and standing posture exercises with noted visible UE fatigue due to weakness.  Ended session with manual position release to BIl Upper trap and scalenes (Lt tighter than Rt) to reduce spasms and improvie AROM.  No reports of pain through session.  Pt encouraged to drink extra water to reduce risk of headaches following manual.     PT Next Visit Plan Shoulder and cervical stabilization; manual therapy as appropriate; work on form for exercises in gym;  Next session continue with cervical mobiltuiy, stretches with reduced reps and progress UE strengthening with UBE, cybex rows, and prone exercises.          Problem List Patient Active Problem List   Diagnosis Date Noted  . Neck pain 08/10/2011  . Musculoskeletal pain 08/10/2011  . KNEE PAIN, RIGHT 01/30/2010  . 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 UNEQUAL LEG LENGTH 01/30/2010  . ABNORMALITY OF GAIT 01/30/2010   Ihor Austin, [place]  Aldona Lento 04/04/2014, 12:09 PM  [employer] [place], [place], [address] Phone: [phone]   Fax:  [phone]

## 2014-04-06 ENCOUNTER — Ambulatory Visit (HOSPITAL_COMMUNITY): Payer: BLUE CROSS/BLUE SHIELD | Admitting: Physical Therapy

## 2014-04-06 DIAGNOSIS — M542 Cervicalgia: Secondary | ICD-10-CM | POA: Diagnosis not present

## 2014-04-06 DIAGNOSIS — M5382 Other specified dorsopathies, cervical region: Secondary | ICD-10-CM

## 2014-04-06 DIAGNOSIS — R29898 Other symptoms and signs involving the musculoskeletal system: Secondary | ICD-10-CM

## 2014-04-06 NOTE — Therapy (Signed)
[facility] [address] [place], [place], [address] Phone: [phone]   Fax:  [phone]  Physical Therapy Treatment  Patient Details  Name: Stephanie [name] MRN: [id number] Date of Birth: 03/15/1985 Referring Provider:  Caryl Bis, MD  Encounter Date: 04/06/2014      PT End of Session - 04/06/14 1152    Visit Number 4   Number of Visits 12   Date for PT Re-Evaluation 04/16/14   Authorization Type BCBS   Authorization Time Period 03/26/14 to 05/26/14   PT Start Time 1100   PT Stop Time 1145   PT Time Calculation (min) 45 min   Activity Tolerance Patient tolerated treatment well   Behavior During Therapy [facility] for tasks assessed/performed      No past medical history on file.  No past surgical history on file.  There were no vitals filed for this visit.  Visit Diagnosis:  Neck pain  Weakness of shoulder  Weakness of neck      Subjective Assessment - 04/06/14 1115    Symptoms Pain free today, doing very well overall. States she is doing much better with holding good posture at work. Surprised that therapy has been able to reduce pain so quickly.    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 The day the stitches were taken out, the pain started that evening and patient reported to ER.  Patient has had therapy for neck before with this clinic; has also tried chiropractors but did NOT enjoy their treatment. During flare-ups, patient would go and try to find good chiro.                        [facility] Adult PT Treatment/Exercise - 04/06/14 0001    Neck Exercises: Theraband   Shoulder Extension 10 reps   Shoulder Extension Limitations 2# against gravity, good neck mechanics   Rows 10 reps   Rows Limitations 2# against gravity   Neck Exercises: Standing   Wall Push Ups 10 reps   Wall Push Ups  Limitations cues for form to maintain neutral neck   Other Standing Exercises 3D thoracic and cervical excursions 1x10   Other Standing Exercises Shoulder flexion/protraction (in supine)/ ABD with neck retractions during exercise to improve biomechanics/neck stability during active exercise   Neck Exercises: Seated   Other Seated Exercise Shoulder retractions 1x20 with green TB   Manual Therapy   Manual Therapy Myofascial release   Myofascial Release To bilateral upper trap; suboccipital release to cervical extensors   Neck Exercises: Stretches   Upper Trapezius Stretch 4 reps;30 seconds   Upper Trapezius Stretch Limitations bilateral   Corner Stretch 3 reps;30 seconds   Other Neck Stretches Attempted Lat stretch however patient experienced numbness and tingling in hands so activity terminated                PT Education - 04/06/14 1152    Education provided Yes   Education Details Education regarding importance of proper posture during dynamic activities; patient gave great return demonstration   Person(s) Educated Patient   Methods Explanation   Comprehension Verbalized understanding;Returned demonstration          PT Short Term Goals - 04/04/14 1101    PT SHORT TERM GOAL #1   Title Patient will demonstrate good postural awareness at work and during functional exercises  in order to reduce postural dysfunction and related neck pain   Status On-going   PT SHORT TERM GOAL #2   Title Patient will demonstrate full range of motion through all cervical planes in order to reduce pain and improve function during tasks such as driving   Status On-going   PT SHORT TERM GOAL #3   Title Patient will demonstrate reduced muscle spasms as evidenced by reduced need for manual mobilization of muscle spasms in cervical spine and upper traps   Status On-going           PT Long Term Goals - 04/04/14 1103    PT LONG TERM GOAL #1   Title Patient will consistently and correctly perform  appropriate HEP in order to assist in managing condition and reducing overall pain   Status On-going   PT LONG TERM GOAL #2   Title Patient will demonstrate at least 4+/5 strengh in cervical and shoulder musculature as well as shoulder girdle stabilizers in order to promote stablity within general region   Status On-going   PT LONG TERM GOAL #3   Title Patient will be able to perform full workout at the gym without neck pain and with good form throughout in order to reduce stress and strain on cervical area during functional tasks   PT LONG TERM GOAL #4   Title Patient will report the ability to perform a full day of functional work tasks with good posture and neck pain 0/10 throughout, good self-monitoring strategies for postural adjustments               Plan - 04/06/14 1153    Clinical Impression Statement Patient demonstrated the ability to perform dynamic upper extremity and shoulder stabilizations exercises with weights while maintaining good alignment of cervical spine and posture. Introduced dynamic exercise today in combination with cervical retraction/protraction with good results. Responded well to myofascial release to bilateral upper traps and suboccipital release. Patient progressing very well overall.    Pt will benefit from skilled therapeutic intervention in order to improve on the following deficits Increased muscle spasms;Improper body mechanics;Decreased strength;Postural dysfunction;Decreased range of motion;Pain;Decreased coordination;Decreased safety awareness   Rehab Potential Good   PT Frequency 2x / week   PT Duration 6 weeks   PT Treatment/Interventions ADLs/Self Care Home Management;Traction;Neuromuscular re-education;Ultrasound;Functional mobility training;Therapeutic activities;Electrical Stimulation;Therapeutic exercise;Manual techniques   PT Next Visit Plan Shoulder and cervical stabilization; manual therapy as appropriate; work on form for exercises in gym;   Next session continue with cervical mobiltuiy, stretches with reduced reps and progress UE strengthening with UBE, cybex rows, and prone exercises.     PT Home Exercise Plan given   Consulted and Agree with Plan of Care Patient        Problem List Patient Active Problem List   Diagnosis Date Noted  . Neck pain 08/10/2011  . Musculoskeletal pain 08/10/2011  . KNEE PAIN, RIGHT 01/30/2010  . 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 UNEQUAL LEG LENGTH 01/30/2010  . ABNORMALITY OF GAIT 01/30/2010    Deniece Ree PT, DPT [facility] [address] [place], [place], [address] Phone: [phone]   Fax:  [phone]

## 2014-04-09 NOTE — Addendum Note (Signed)
Addended by: Hunt Oris on: 04/09/2014 08:14 AM   Modules accepted: Orders

## 2014-04-10 ENCOUNTER — Ambulatory Visit (HOSPITAL_COMMUNITY): Payer: BLUE CROSS/BLUE SHIELD | Admitting: Physical Therapy

## 2014-04-10 DIAGNOSIS — M542 Cervicalgia: Secondary | ICD-10-CM

## 2014-04-10 DIAGNOSIS — M5382 Other specified dorsopathies, cervical region: Secondary | ICD-10-CM

## 2014-04-10 DIAGNOSIS — R29898 Other symptoms and signs involving the musculoskeletal system: Secondary | ICD-10-CM

## 2014-04-10 NOTE — Therapy (Signed)
[facility] [place], [place], [address] Phone: [phone]   Fax:  [phone]  Physical Therapy Treatment  Patient Details  Name: Stephanie [name] MRN: [id number] Date of Birth: 08/23/1985 Referring Provider:  Caryl Bis, MD  Encounter Date: 04/10/2014      PT End of Session - 04/10/14 1543    Visit Number 5   Number of Visits 12   Date for PT Re-Evaluation 04/16/14   Authorization Type BCBS   Authorization Time Period 03/26/14 to 05/26/14   PT Start Time 1432   PT Stop Time 1518   PT Time Calculation (min) 46 min   Activity Tolerance Patient tolerated treatment well   Behavior During Therapy [facility] for tasks assessed/performed      No past medical history on file.  No past surgical history on file.  There were no vitals filed for this visit.  Visit Diagnosis:  Neck pain  Weakness of shoulder  Weakness of neck      Subjective Assessment - 04/10/14 1433    Symptoms Patient remains pain free today, continues to do well overall; states she is continuing to do well with postural awareness. Patient says that she did hear/feel some crunching in her neck during her workout yesterday but no pain with the noises.    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 The day the stitches were taken out, the pain started that evening and patient reported to ER.  Patient has had therapy for neck before with this clinic; has also tried chiropractors but did NOT enjoy their treatment. During flare-ups, patient would go and try to find good chiro.    Currently in Pain? No/denies            [facility] PT Assessment - 04/10/14 0001    Posture/Postural Control   Posture Comments ROOS Test negative                    [facility] Adult PT Treatment/Exercise - 04/10/14 0001    Neck Exercises: Theraband   Other Theraband  Exercises Overhead press/shoulder flexion/abduction/shrugs/rows with 3# weight 1x15, focus on good neurtral neck positioning, cervical retraction/protrustions    Neck Exercises: Standing   Other Standing Exercises 3D thoracic and cervical excursions 1x15   Other Standing Exercises Forward planks on elbows 3x30 seconds/side planks 3x20 seconds each side for improved shoulder girdle stability. All planks done off of low mat table.    Neck Exercises: Stretches   Upper Trapezius Stretch 3 reps;30 seconds   Upper Trapezius Stretch Limitations bilateral   Corner Stretch 3 reps;30 seconds   Other Neck Stretches Attempted lat stretch again                 PT Education - 04/10/14 1543    Education provided Yes   Education Details Education regarding progress towards PT goals; proper posturing during sitting on couch and finding proper pillow to avoid neck dysfunction in sleep   Person(s) Educated Patient   Methods Explanation   Comprehension Verbalized understanding          PT Short Term Goals - 04/10/14 1503    PT SHORT TERM GOAL #1   Title Patient will demonstrate good postural awareness at work and during functional exercises in order to reduce postural dysfunction and related neck pain   Baseline 3/29- patient states she is  able to much more quickly notice and adjust her posture at work/during driving/exercising; states she still has difficulty aligning neck during homework and finding good position during sleep   Time 3   Period Weeks   Status Partially Met   PT SHORT TERM GOAL #2   Title Patient will demonstrate full range of motion through all cervical planes in order to reduce pain and improve function during tasks such as driving   Time 3   Period Weeks   Status On-going   PT SHORT TERM GOAL #3   Title Patient will demonstrate reduced muscle spasms as evidenced by reduced need for manual mobilization of muscle spasms in cervical spine and upper traps   Baseline 3/29-  continuing to treat muscle tightness during PT sessions; however patient reports reduced spasms throughout her day   Time 3   Period Weeks   Status Achieved           PT Long Term Goals - 04/10/14 1506    PT LONG TERM GOAL #1   Title Patient will consistently and correctly perform appropriate HEP in order to assist in managing condition and reducing overall pain   Time 6   Period Weeks   Status On-going   PT LONG TERM GOAL #2   Title Patient will demonstrate at least 4+/5 strengh in cervical and shoulder musculature as well as shoulder girdle stabilizers in order to promote stablity within general region   Time 6   Period Weeks   Status On-going   PT LONG TERM GOAL #3   Title Patient will be able to perform full workout at the gym without neck pain and with good form throughout in order to reduce stress and strain on cervical area during functional tasks   Baseline 3/29- patient states this is going well but still has discomfort with plyometrics (burpees, etc). Overall going much better, able to adjust weight during exercise to reduce strain on neck.    Time 6   Period Weeks   Status On-going   PT LONG TERM GOAL #4   Title Patient will report the ability to perform a full day of functional work tasks with good posture and neck pain 0/10 throughout, good self-monitoring strategies for postural adjustments   Time 6   Period Weeks   Status Achieved               Plan - 04/10/14 1545    Clinical Impression Statement Continued dynamic exercise progression for cervical spine and shoulder girdle. Introduced forward and side planks on elbows, which patient responded well to. However patient does continue to show continued weakness and quick fatigue of general shoulder musculature. Patient also states that she has difficulty in maintaining good neck posture during pylometric exercises as well as when doing homework while sitting on her couch.  Continues to progress well overall.     Pt will benefit from skilled therapeutic intervention in order to improve on the following deficits Increased muscle spasms;Improper body mechanics;Decreased strength;Postural dysfunction;Decreased range of motion;Pain;Decreased coordination;Decreased safety awareness   Rehab Potential Good   PT Frequency 2x / week   PT Duration 6 weeks   PT Treatment/Interventions ADLs/Self Care Home Management;Traction;Neuromuscular re-education;Ultrasound;Functional mobility training;Therapeutic activities;Electrical Stimulation;Therapeutic exercise;Manual techniques   PT Next Visit Plan Shoulder and cervical stabilization inluding planks; manual therapy as appropriate; work on form for exercises in gym;  Next session continue with cervical mobility, stretches with reduced reps and progress UE strengthening with UBE, cybex rows, and prone  exercises.     PT Home Exercise Plan given   Consulted and Agree with Plan of Care Patient        Problem List Patient Active Problem List   Diagnosis Date Noted  . Neck pain 08/10/2011  . Musculoskeletal pain 08/10/2011  . KNEE PAIN, RIGHT 01/30/2010  . 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 UNEQUAL LEG LENGTH 01/30/2010  . ABNORMALITY OF GAIT 01/30/2010    Deniece Ree PT, DPT [facility] [address] [place], [place], [address] Phone: [phone]   Fax:  [phone]

## 2014-04-13 ENCOUNTER — Ambulatory Visit (HOSPITAL_COMMUNITY): Payer: BLUE CROSS/BLUE SHIELD | Attending: Family Medicine | Admitting: Physical Therapy

## 2014-04-13 DIAGNOSIS — M542 Cervicalgia: Secondary | ICD-10-CM | POA: Diagnosis not present

## 2014-04-13 DIAGNOSIS — M5382 Other specified dorsopathies, cervical region: Secondary | ICD-10-CM | POA: Diagnosis not present

## 2014-04-13 DIAGNOSIS — R29898 Other symptoms and signs involving the musculoskeletal system: Secondary | ICD-10-CM

## 2014-04-13 DIAGNOSIS — M6281 Muscle weakness (generalized): Secondary | ICD-10-CM | POA: Diagnosis not present

## 2014-04-13 NOTE — Therapy (Signed)
[place], [place], [address] Phone: [phone]   Fax:  [phone]  Physical Therapy Treatment  Patient Details  Name: Stephanie [name] MRN: [id number] Date of Birth: 1985/05/16 Referring Provider:  Caryl Bis, MD  Encounter Date: 04/13/2014      PT End of Session - 04/13/14 1611    Visit Number 6   Number of Visits 12   Date for PT Re-Evaluation 04/16/14   Authorization Type BCBS   Authorization Time Period 03/26/14 to 05/26/14   PT Start Time 1101   PT Stop Time 1142   PT Time Calculation (min) 41 min   Activity Tolerance Patient tolerated treatment well   Behavior During Therapy [facility] for tasks assessed/performed      No past medical history on file.  No past surgical history on file.  There were no vitals filed for this visit.  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 The day the stitches were taken out, the pain started that evening and patient reported to ER.  Patient has had therapy for neck before with this clinic; has also tried chiropractors but did NOT enjoy their treatment. During flare-ups, patient would go and try to find good chiro.    Currently in Pain? No/denies                       [facility] Adult PT Treatment/Exercise - 04/13/14 0001    Neck Exercises: Theraband   Shoulder Extension 10 reps   Shoulder Extension Limitations Tricep pushups from 18 inch box   Other Theraband Exercises Pushups 1x10 on 18 inch box   Other Theraband Exercises Shoulder flexion/abduction/overhead press/rows 1x15 with 4#, cues for keeping neck in proper alignment. Punching drill including  straights, upper cuts, and hooks with 4# weights, cues for proper neck alignment 1x10 each   Neck Exercises: Standing   Other Standing Exercises 3D cervical excursions 1x15 each way   Neck Exercises: Prone   Other Prone Exercise Planks on elbows 4x45 seconds forward, 4x45 seconds each side, reverse plank 4x20 seconds   Neck Exercises: Stretches   Upper Trapezius Stretch 3 reps;30 seconds   Upper Trapezius Stretch Limitations bilateral                PT Education - 04/13/14 1609    Education provided Yes   Education Details Education regarding keeping neck in neutral position during more whole body/dynamic tasks such as reverse planks and tricep pushups; education regarding good progress with skilled PT services and possible upcoming discharge   Person(s) Educated Patient   Methods Explanation   Comprehension Verbalized understanding;Need further instruction          PT Short Term Goals - 04/10/14 1503    PT SHORT TERM GOAL #1   Title Patient will demonstrate good postural awareness at work and during functional exercises in order to reduce postural dysfunction and related neck pain   Baseline 3/29- patient states she is able to much more quickly notice and adjust her posture at work/during driving/exercising; states she still has difficulty aligning neck during homework and finding good  position during sleep   Time 3   Period Weeks   Status Partially Met   PT SHORT TERM GOAL #2   Title Patient will demonstrate full range of motion through all cervical planes in order to reduce pain and improve function during tasks such as driving   Time 3   Period Weeks   Status On-going   PT SHORT TERM GOAL #3   Title Patient will demonstrate reduced muscle spasms as evidenced by reduced need for manual mobilization of muscle spasms in cervical spine and upper traps   Baseline 3/29- continuing to treat muscle tightness during PT sessions; however patient reports reduced spasms throughout  her day   Time 3   Period Weeks   Status Achieved           PT Long Term Goals - 04/10/14 1506    PT LONG TERM GOAL #1   Title Patient will consistently and correctly perform appropriate HEP in order to assist in managing condition and reducing overall pain   Time 6   Period Weeks   Status On-going   PT LONG TERM GOAL #2   Title Patient will demonstrate at least 4+/5 strengh in cervical and shoulder musculature as well as shoulder girdle stabilizers in order to promote stablity within general region   Time 6   Period Weeks   Status On-going   PT LONG TERM GOAL #3   Title Patient will be able to perform full workout at the gym without neck pain and with good form throughout in order to reduce stress and strain on cervical area during functional tasks   Baseline 3/29- patient states this is going well but still has discomfort with plyometrics (burpees, etc). Overall going much better, able to adjust weight during exercise to reduce strain on neck.    Time 6   Period Weeks   Status On-going   PT LONG TERM GOAL #4   Title Patient will report the ability to perform a full day of functional work tasks with good posture and neck pain 0/10 throughout, good self-monitoring strategies for postural adjustments   Time 6   Period Weeks   Status Achieved               Plan - 04/13/14 1611    Clinical Impression Statement Patient continues to tolerate planks and overall advanced shoulder/cervical stabilization program very well; however she did demonstrate some difficulty in maintaining neutral positioning of cervical spine during tasks such as box pushups and tricep pushups. However patient making excellent progress overall and will likely be ready for discharge after 1-2 more sessions to continue addresssing shoulder girdle/cervical stabilization, maintaining correct cervical posture during challenging dynamic tasks, and for myofascial release techniques.    Pt will benefit from  skilled therapeutic intervention in order to improve on the following deficits Increased muscle spasms;Improper body mechanics;Decreased strength;Postural dysfunction;Decreased range of motion;Pain;Decreased coordination;Decreased safety awareness   Rehab Potential Good   PT Frequency 2x / week   PT Duration 6 weeks   PT Treatment/Interventions ADLs/Self Care Home Management;Traction;Neuromuscular re-education;Ultrasound;Functional mobility training;Therapeutic activities;Electrical Stimulation;Therapeutic exercise;Manual techniques   PT Next Visit Plan Shoulder and cervical stabilization inluding planks; manual therapy as appropriate; work on form for exercises in gym. Continue dynamic functional exercises including variations of plank, shoudler stabilization, introduce pylometrics that are focused on shoulder girdle.    PT Home Exercise Plan given   Consulted and Agree with Plan of Care Patient        Problem  List Patient Active Problem List   Diagnosis Date Noted  . Neck pain 08/10/2011  . Musculoskeletal pain 08/10/2011  . KNEE PAIN, RIGHT 01/30/2010  . 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 UNEQUAL LEG LENGTH 01/30/2010  . ABNORMALITY OF GAIT 01/30/2010   Deniece Ree PT, DPT [facility] [address] [place], [place], [address] Phone: [phone]   Fax:  [phone]

## 2014-04-17 ENCOUNTER — Encounter (HOSPITAL_COMMUNITY): Payer: Self-pay | Admitting: Physical Therapy

## 2014-04-18 ENCOUNTER — Ambulatory Visit (HOSPITAL_COMMUNITY): Payer: BLUE CROSS/BLUE SHIELD | Admitting: Physical Therapy

## 2014-04-18 DIAGNOSIS — R29898 Other symptoms and signs involving the musculoskeletal system: Secondary | ICD-10-CM

## 2014-04-18 DIAGNOSIS — M542 Cervicalgia: Secondary | ICD-10-CM

## 2014-04-18 DIAGNOSIS — M5382 Other specified dorsopathies, cervical region: Secondary | ICD-10-CM

## 2014-04-18 NOTE — Therapy (Signed)
[facility] [address] [place], [place], [address] Phone: [phone]   Fax:  [phone]  Physical Therapy Treatment  Patient Details  Name: Stephanie [name] MRN: [id number] Date of Birth: 02/25/1985 Referring Provider:  Caryl Bis, MD  Encounter Date: 04/18/2014      PT End of Session - 04/18/14 1918    Visit Number 7   Number of Visits 12   Date for PT Re-Evaluation 04/16/14   Authorization Type BCBS   Authorization Time Period 03/26/14 to 05/26/14   PT Start Time 1301   PT Stop Time 1345   PT Time Calculation (min) 44 min   Activity Tolerance Patient tolerated treatment well   Behavior During Therapy [facility] for tasks assessed/performed      No past medical history on file.  No past surgical history on file.  There were no vitals filed for this visit.  Visit Diagnosis:  Neck pain  Weakness of shoulder  Weakness of neck      Subjective Assessment - 04/18/14 1910    Subjective Patient states only mild neck soreness. no pain notes soreness attributed ot increased difficulty in exercises.    Currently in Pain? No/denies                       [facility] Adult PT Treatment/Exercise - 04/18/14 0001    Neck Exercises: Standing   Other Standing Exercises 3D cervical excursion 10x 3 seconds each way   Other Standing Exercises overhead dumbbell matrix 5x each with 3lb weight; 3 way ball slam with yellow ball 5x, reverse golfer squat yellow ball toss 10x.CHair dips 10x; 4 way pick up and reach 5lb dumbbel 10x each   Neck Exercises: Prone   Other Prone Exercise UE ground matrix on high table 5x each   Other Prone Exercise push up matrix 3x at low table.   Manual Therapy   Manual Therapy Myofascial release   Myofascial Release To bilateral upper trap; suboccipital release to cervical extensors   Neck Exercises: Stretches   Chest Stretch Limitations   Chest Stretch Limitations high Pec stretch 10x 3 seconds.                  PT Education - 04/18/14 1918    Education provided Yes   Education Details HEP progressed: added overhead dumbbell matrix and Upper extremity ground matrix to HEP four shoudler and neck stabilization.   Person(s) Educated Patient   Methods Explanation;Demonstration;Handout   Comprehension Verbalized understanding;Returned demonstration          PT Short Term Goals - 04/10/14 1503    PT SHORT TERM GOAL #1   Title Patient will demonstrate good postural awareness at work and during functional exercises in order to reduce postural dysfunction and related neck pain   Baseline 3/29- patient states she is able to much more quickly notice and adjust her posture at work/during driving/exercising; states she still has difficulty aligning neck during homework and finding good position during sleep   Time 3   Period Weeks   Status Partially Met   PT SHORT TERM GOAL #2   Title Patient will demonstrate full range of motion through all cervical planes in order to reduce pain and improve function during tasks such as driving   Time 3   Period Weeks   Status On-going   PT SHORT TERM GOAL #3   Title Patient will demonstrate reduced muscle spasms as evidenced by reduced  need for manual mobilization of muscle spasms in cervical spine and upper traps   Baseline 3/29- continuing to treat muscle tightness during PT sessions; however patient reports reduced spasms throughout her day   Time 3   Period Weeks   Status Achieved           PT Long Term Goals - 04/10/14 1506    PT LONG TERM GOAL #1   Title Patient will consistently and correctly perform appropriate HEP in order to assist in managing condition and reducing overall pain   Time 6   Period Weeks   Status On-going   PT LONG TERM GOAL #2   Title Patient will demonstrate at least 4+/5 strengh in cervical and shoulder musculature as well as shoulder girdle stabilizers in order to promote stablity within general region    Time 6   Period Weeks   Status On-going   PT LONG TERM GOAL #3   Title Patient will be able to perform full workout at the gym without neck pain and with good form throughout in order to reduce stress and strain on cervical area during functional tasks   Baseline 3/29- patient states this is going well but still has discomfort with plyometrics (burpees, etc). Overall going much better, able to adjust weight during exercise to reduce strain on neck.    Time 6   Period Weeks   Status On-going   PT LONG TERM GOAL #4   Title Patient will report the ability to perform a full day of functional work tasks with good posture and neck pain 0/10 throughout, good self-monitoring strategies for postural adjustments   Time 6   Period Weeks   Status Achieved               Plan - 04/18/14 1919    Clinical Impression Statement Patient displays improving UE, scapular, and neck stabilizationthough still has limited strength as indicatign by bilateral UE shaking during overhead lifting with patient noting all directions of overhead press and UE ground matrix felt weak. Patiet able to follwo directions well and is independent with HEP. recommend continuing PT 1x a week for 4 more weekswith focus on progressing HEP and strengtheing of bilateral shoulders.    PT Next Visit Plan Continue UE cound matrix, Prgress 4 way pick up and reach to 10lb dumbbells, progress overhead dumbbell matrix to 5lb, continue to progress scapular and neck stabilization, attempt cervical spine matrix with 5lb dowel.         Problem List Patient Active Problem List   Diagnosis Date Noted  . Neck pain 08/10/2011  . Musculoskeletal pain 08/10/2011  . KNEE PAIN, RIGHT 01/30/2010  . 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 UNEQUAL LEG LENGTH 01/30/2010  . ABNORMALITY OF GAIT 01/30/2010   Devona Konig PT DPT [place], [place], [address] Phone:  [phone]   Fax:  [phone]

## 2014-04-19 ENCOUNTER — Encounter (HOSPITAL_COMMUNITY): Payer: Self-pay | Admitting: Physical Therapy

## 2014-04-25 ENCOUNTER — Ambulatory Visit (HOSPITAL_COMMUNITY): Payer: BLUE CROSS/BLUE SHIELD | Admitting: Physical Therapy

## 2014-04-25 DIAGNOSIS — M5382 Other specified dorsopathies, cervical region: Secondary | ICD-10-CM

## 2014-04-25 DIAGNOSIS — M542 Cervicalgia: Secondary | ICD-10-CM | POA: Diagnosis not present

## 2014-04-25 DIAGNOSIS — R29898 Other symptoms and signs involving the musculoskeletal system: Secondary | ICD-10-CM

## 2014-04-25 NOTE — Therapy (Signed)
[facility] [address] [place], [place], [address] Phone: [phone]   Fax:  [phone]  Physical Therapy Treatment  Patient Details  Name: Stephanie [name] MRN: [id number] Date of Birth: [birth year] Referring Provider:  Caryl Bis, MD  Encounter Date: 04/25/2014      PT End of Session - 04/25/14 1606    Visit Number 8   Number of Visits 12   Date for PT Re-Evaluation 04/25/14   Authorization Type BCBS   PT Start Time 1522   PT Stop Time 1600   PT Time Calculation (min) 38 min   Activity Tolerance Patient tolerated treatment well   Behavior During Therapy [facility] for tasks assessed/performed      No past medical history on file.  No past surgical history on file.  There were no vitals filed for this visit.  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 The day the stitches were taken out, the pain started that evening and patient reported to ER.  Patient has had therapy for neck before with this clinic; has also tried chiropractors but did NOT enjoy their treatment. During flare-ups, patient would go and try to find good chiro.    Currently in Pain? No/denies            [facility] PT Assessment - 04/25/14 0001    Observation/Other Assessments   Focus on Therapeutic Outcomes (FOTO)  28% limited    AROM   Right Shoulder Flexion 172 Degrees   Right Shoulder ABduction 178 Degrees   Left Shoulder Flexion 170 Degrees   Left Shoulder ABduction 178 Degrees   Cervical Flexion 88   Cervical Extension 60   Cervical - Right Side Bend 48   Cervical - Left Side Bend 46   Cervical - Right Rotation 90   Cervical - Left Rotation 87   Strength   Right Shoulder Flexion 4+/5   Right Shoulder ABduction 4+/5   Right Shoulder Internal Rotation 5/5   Right Shoulder External Rotation 5/5   Left Shoulder Flexion 4+/5   Left Shoulder ABduction 4+/5   Left Shoulder Internal Rotation 5/5   Left Shoulder External Rotation 5/5                   [facility] Adult PT Treatment/Exercise - 04/25/14 0001    Neck Exercises: Prone   Other Prone Exercise Planks forward with bilateral upper extremities 60 seconds; unilateral side planks 60 seconds; unilateral forward planks 2x20 seconds    Other Prone Exercise Punching matrix with 4# weights 1x10                PT Education - 04/25/14 1606    Education provided Yes   Education Details DC today; postural awareness at home and in the workplace    Person(s) Educated Patient   Methods Explanation   Comprehension Verbalized understanding          PT Short Term Goals - 04/25/14 1610    PT SHORT TERM GOAL #1   Title Patient will demonstrate good postural awareness at work and during functional  exercises in order to reduce postural dysfunction and related neck pain   Baseline 4/13-  patient reports that she is much more aware of posture and how to fix it during static and dynamic situations    Time 3   Period Weeks   Status Achieved   PT SHORT TERM GOAL #2   Title Patient will demonstrate full range of motion through all cervical planes in order to reduce pain and improve function during tasks such as driving   Time 3   Period Weeks   Status Achieved   PT SHORT TERM GOAL #3   Title Patient will demonstrate reduced muscle spasms as evidenced by reduced need for manual mobilization of muscle spasms in cervical spine and upper traps   Time 3   Period Weeks   Status Achieved           PT Long Term Goals - 04/25/14 1610    PT LONG TERM GOAL #1   Title Patient will consistently and correctly perform  appropriate HEP in order to assist in managing condition and reducing overall pain   Time 6   Period Weeks   Status Achieved   PT LONG TERM GOAL #2   Title Patient will demonstrate at least 4+/5 strengh in cervical and shoulder musculature as well as shoulder girdle stabilizers in order to promote stablity within general region   Time 6   Period Weeks   Status Achieved   PT LONG TERM GOAL #3   Title Patient will be able to perform full workout at the gym without neck pain and with good form throughout in order to reduce stress and strain on cervical area during functional tasks   Baseline 4/13- patient able to find neutral position for challenging tasks and activities    Time 6   Period Weeks   Status Achieved   PT LONG TERM GOAL #4   Title Patient will report the ability to perform a full day of functional work tasks with good posture and neck pain 0/10 throughout, good self-monitoring strategies for postural adjustments   Time 6   Period Weeks   Status Achieved               Plan - 04/25/14 1607    Clinical Impression Statement Discharge assessment completed today. Patient demonstrates terrific improvements in cervical and shoulder range of motion as well as in shoulder strength and postural awareness/ability to correct posture at this time. Patient is aware of and able of performing all corrective tasks to adjust her posture as needed during static and dynamic activities; she is also able to correctly identify and perform exercises to continue to strengthen her shoulder and neck. Patient remains pain free and has met all functional goals at this time.    Pt will benefit from skilled therapeutic intervention in order to improve on the following deficits Increased muscle spasms;Improper body mechanics;Decreased strength;Postural dysfunction;Decreased range of motion;Pain;Decreased coordination;Decreased safety awareness   Rehab Potential Good   PT Frequency 2x / week   PT Duration  6 weeks   PT Treatment/Interventions ADLs/Self Care Home Management;Traction;Neuromuscular re-education;Ultrasound;Functional mobility training;Therapeutic activities;Electrical Stimulation;Therapeutic exercise;Manual techniques   PT Next Visit Plan Discharge today    PT Home Exercise Plan given   Consulted and Agree with Plan of Care Patient        Problem List Patient Active Problem List   Diagnosis Date Noted  . Neck pain 08/10/2011  . Musculoskeletal pain 08/10/2011  . KNEE PAIN, RIGHT 01/30/2010  .  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 UNEQUAL LEG LENGTH 01/30/2010  . ABNORMALITY OF GAIT 01/30/2010    Deniece Ree PT, DPT [facility] [address] [place], [place], [address] Phone: [phone]   Fax:  [phone]   PHYSICAL THERAPY DISCHARGE SUMMARY  Visits from Start of Care: 8  Current functional level related to goals / functional outcomes: Patient has met all functional goals at this time    Remaining deficits: Patient does continue to display some weakness and reduced muscle mass in shoulder and in cervical region; however she has been consistently pain free and is aware of correct exercises and postural adjustment techniques to assist her in managing her condition.    Education / Equipment: Education regarding upper extremity, core, and postural exercises.  Plan: Patient agrees to discharge.  Patient goals were met. Patient is being discharged due to meeting the stated rehab goals.  ?????        Deniece Ree PT, DPT [phone]

## 2015-04-18 DIAGNOSIS — M542 Cervicalgia: Secondary | ICD-10-CM | POA: Diagnosis not present

## 2015-06-20 DIAGNOSIS — Z6822 Body mass index (BMI) 22.0-22.9, adult: Secondary | ICD-10-CM | POA: Diagnosis not present

## 2015-06-20 DIAGNOSIS — J0101 Acute recurrent maxillary sinusitis: Secondary | ICD-10-CM | POA: Diagnosis not present

## 2015-09-06 DIAGNOSIS — L723 Sebaceous cyst: Secondary | ICD-10-CM | POA: Diagnosis not present

## 2015-09-06 DIAGNOSIS — D239 Other benign neoplasm of skin, unspecified: Secondary | ICD-10-CM | POA: Diagnosis not present

## 2015-09-23 DIAGNOSIS — Z1151 Encounter for screening for human papillomavirus (HPV): Secondary | ICD-10-CM | POA: Diagnosis not present

## 2015-09-23 DIAGNOSIS — Z6823 Body mass index (BMI) 23.0-23.9, adult: Secondary | ICD-10-CM | POA: Diagnosis not present

## 2015-09-23 DIAGNOSIS — Z01419 Encounter for gynecological examination (general) (routine) without abnormal findings: Secondary | ICD-10-CM | POA: Diagnosis not present

## 2015-12-03 DIAGNOSIS — L720 Epidermal cyst: Secondary | ICD-10-CM | POA: Diagnosis not present

## 2016-07-30 ENCOUNTER — Ambulatory Visit (INDEPENDENT_AMBULATORY_CARE_PROVIDER_SITE_OTHER): Payer: BLUE CROSS/BLUE SHIELD | Admitting: Sports Medicine

## 2016-07-30 VITALS — BP 100/66 | Ht 66.0 in | Wt 139.0 lb

## 2016-07-30 DIAGNOSIS — M25552 Pain in left hip: Secondary | ICD-10-CM

## 2016-07-30 NOTE — Progress Notes (Signed)
   Subjective:    Patient ID: Stephanie [name], female    DOB: Apr 24, 1985, 31 y.o.   MRN: [id number]  HPI cc: left hip pain  Patient presents today with left hip pain that started around March. She first noticed this when she exited the car with her knee bent and leg abducted. Since then it has continued intermittently, not every single day. She notices it with getting out of car and when doing butterfly stretches or sitting [place] style. She denies any injury. This is not interfering with her work outs.  PMH- leg length discrepancy, ITBS of right knee  Review of Systems- no numbness/tingling down leg or weakness     Objective:   Physical Exam  Gen: well nourished well developed in NAD BP 100/66   Ht 5\' 6"  (1.676 m)   Wt 139 lb (63 kg)   BMI 22.44 kg/m   Left hip: no swelling, non-tender to palpation.  Full ROM on rotation Full ROM  in flexion and extension of hip . + FABER test. To left with pain and limited motion  Weakness with hip abducted and extended.  Glut med testing was strong No TTP at grt trochanter Neurovascularly in tact.      Assessment & Plan:   Left hip pain- secondary to Gluteus maximus strain at intersection with gluteus medius -patient given stretches and exercises to do over next 4 weeks -instructed to follow up if pain persists or worsens, would consider US imaging at that time -patient verbalized agreement and understanding of plan  Lucila Maine, DO PGY-2, [facility] 07/30/2016 4:44 PM   I observed and examined the patient with the resident and agree with assessment and plan.  Note reviewed and modified by me. Stefanie Libel, MD

## 2016-07-30 NOTE — Patient Instructions (Signed)
It was great seeing you today! It looks like you've strained your gluteus medius and piriformis. Here are some exercises and stretches to do.  Do 15 of these for 3 sets: Leg abduction (laying on your side, keep legs even then move left behind the right) Leg extensions (laying on your side) Leg swings (standing- add ankle weights eventually) Backward step ups  Stretch hip muscles by: crossing leg while sitting up and leaning forward, crossing leg while laying on back and pulling up to chest with other leg, or swinging left leg over right while laying down and pulling hip over.   Piriformis Syndrome Rehab Ask your health care provider which exercises are safe for you. Do exercises exactly as told by your health care provider and adjust them as directed. It is normal to feel mild stretching, pulling, tightness, or discomfort as you do these exercises, but you should stop right away if you feel sudden pain or your pain gets worse.Do not begin these exercises until told by your health care provider. Stretching and range of motion exercises These exercises warm up your muscles and joints and improve the movement and flexibility of your hip and pelvis. These exercises also help to relieve pain, numbness, and tingling. Exercise A: Hip rotators  1. Lie on your back on a firm surface. 2. Pull your left / right knee toward your same shoulder with your left / right hand until your knee is pointing toward the ceiling. Hold your left / right ankle with your other hand. 3. Keeping your knee steady, gently pull your left / right ankle toward your other shoulder until you feel a stretch in your buttocks. 4. Hold this position for __________ seconds. Repeat __________ times. Complete this stretch __________ times a day. Exercise B: Hip extensors 1. Lie on your back on a firm surface. Both of your legs should be straight. 2. Pull your left / right knee to your chest. Hold your leg in this position by holding  onto the back of your thigh or the front of your knee. 3. Hold this position for __________ seconds. 4. Slowly return to the starting position. Repeat __________ times. Complete this stretch __________ times a day. Strengthening exercises These exercises build strength and endurance in your hip and thigh muscles. Endurance is the ability to use your muscles for a long time, even after they get tired. Exercise C: Straight leg raises ( hip abductors) 1. Lie on your side with your left / right leg in the top position. Lie so your head, shoulder, knee, and hip line up. Bend your bottom knee to help you balance. 2. Lift your top leg up 4-6 inches (10-15 cm), keeping your toes pointed straight ahead. 3. Hold this position for __________ seconds. 4. Slowly lower your leg to the starting position. Let your muscles relax completely. Repeat __________ times. Complete this exercise__________ times a day. Exercise D: Hip abductors and rotators, quadruped  1. Get on your hands and knees on a firm, lightly padded surface. Your hands should be directly below your shoulders, and your knees should be directly below your hips. 2. Lift your left / right knee out to the side. Keep your knee bent. Do not twist your body. 3. Hold this position for __________ seconds. 4. Slowly lower your leg. Repeat __________ times. Complete this exercise__________ times a day. Exercise E: Straight leg raises ( hip extensors) 1. Lie on your abdomen on a bed or a firm surface with a pillow under your hips.  2. Squeeze your buttock muscles and lift your left / right thigh off the bed. Do not let your back arch. 3. Hold this position for __________ seconds. 4. Slowly return to the starting position. Let your muscles relax completely before doing another repetition. Repeat __________ times. Complete this exercise__________ times a day. This information is not intended to replace advice given to you by your health care provider.  Make sure you discuss any questions you have with your health care provider. Document Released: 12/29/2004 Document Revised: 09/03/2015 Document Reviewed: 12/11/2014 Elsevier Interactive Patient Education  [employer].

## 2016-10-01 DIAGNOSIS — Z6823 Body mass index (BMI) 23.0-23.9, adult: Secondary | ICD-10-CM | POA: Diagnosis not present

## 2016-10-01 DIAGNOSIS — Z01419 Encounter for gynecological examination (general) (routine) without abnormal findings: Secondary | ICD-10-CM | POA: Diagnosis not present

## 2016-10-09 DIAGNOSIS — Z1329 Encounter for screening for other suspected endocrine disorder: Secondary | ICD-10-CM | POA: Diagnosis not present

## 2016-10-09 DIAGNOSIS — Z13220 Encounter for screening for lipoid disorders: Secondary | ICD-10-CM | POA: Diagnosis not present

## 2016-10-09 DIAGNOSIS — Z0000 Encounter for general adult medical examination without abnormal findings: Secondary | ICD-10-CM | POA: Diagnosis not present

## 2016-10-09 DIAGNOSIS — Z130 Encounter for screening for diseases of the blood and blood-forming organs and certain disorders involving the immune mechanism: Secondary | ICD-10-CM | POA: Diagnosis not present

## 2016-11-05 DIAGNOSIS — J019 Acute sinusitis, unspecified: Secondary | ICD-10-CM | POA: Diagnosis not present

## 2016-11-05 DIAGNOSIS — Z6823 Body mass index (BMI) 23.0-23.9, adult: Secondary | ICD-10-CM | POA: Diagnosis not present

## 2016-11-05 DIAGNOSIS — R05 Cough: Secondary | ICD-10-CM | POA: Diagnosis not present

## 2016-12-25 DIAGNOSIS — L709 Acne, unspecified: Secondary | ICD-10-CM | POA: Diagnosis not present

## 2016-12-25 DIAGNOSIS — D229 Melanocytic nevi, unspecified: Secondary | ICD-10-CM | POA: Diagnosis not present

## 2016-12-25 DIAGNOSIS — Z85820 Personal history of malignant melanoma of skin: Secondary | ICD-10-CM | POA: Diagnosis not present

## 2017-01-16 DIAGNOSIS — R221 Localized swelling, mass and lump, neck: Secondary | ICD-10-CM | POA: Diagnosis not present

## 2017-01-16 DIAGNOSIS — Z6822 Body mass index (BMI) 22.0-22.9, adult: Secondary | ICD-10-CM | POA: Diagnosis not present

## 2017-01-16 DIAGNOSIS — J00 Acute nasopharyngitis [common cold]: Secondary | ICD-10-CM | POA: Diagnosis not present

## 2017-01-22 DIAGNOSIS — Z85820 Personal history of malignant melanoma of skin: Secondary | ICD-10-CM | POA: Diagnosis not present

## 2017-01-22 DIAGNOSIS — R590 Localized enlarged lymph nodes: Secondary | ICD-10-CM | POA: Diagnosis not present

## 2017-01-22 DIAGNOSIS — R221 Localized swelling, mass and lump, neck: Secondary | ICD-10-CM | POA: Diagnosis not present

## 2017-01-26 DIAGNOSIS — L720 Epidermal cyst: Secondary | ICD-10-CM | POA: Diagnosis not present

## 2017-02-22 ENCOUNTER — Ambulatory Visit (INDEPENDENT_AMBULATORY_CARE_PROVIDER_SITE_OTHER): Payer: BLUE CROSS/BLUE SHIELD | Admitting: Otolaryngology

## 2017-02-22 DIAGNOSIS — R590 Localized enlarged lymph nodes: Secondary | ICD-10-CM

## 2017-03-22 ENCOUNTER — Ambulatory Visit (INDEPENDENT_AMBULATORY_CARE_PROVIDER_SITE_OTHER): Payer: BLUE CROSS/BLUE SHIELD | Admitting: Otolaryngology

## 2017-03-22 DIAGNOSIS — R590 Localized enlarged lymph nodes: Secondary | ICD-10-CM

## 2017-10-06 DIAGNOSIS — Z130 Encounter for screening for diseases of the blood and blood-forming organs and certain disorders involving the immune mechanism: Secondary | ICD-10-CM | POA: Diagnosis not present

## 2017-10-06 DIAGNOSIS — Z131 Encounter for screening for diabetes mellitus: Secondary | ICD-10-CM | POA: Diagnosis not present

## 2017-10-06 DIAGNOSIS — Z01419 Encounter for gynecological examination (general) (routine) without abnormal findings: Secondary | ICD-10-CM | POA: Diagnosis not present

## 2017-10-06 DIAGNOSIS — Z6823 Body mass index (BMI) 23.0-23.9, adult: Secondary | ICD-10-CM | POA: Diagnosis not present

## 2017-10-06 DIAGNOSIS — Z1329 Encounter for screening for other suspected endocrine disorder: Secondary | ICD-10-CM | POA: Diagnosis not present

## 2017-10-06 DIAGNOSIS — Z0000 Encounter for general adult medical examination without abnormal findings: Secondary | ICD-10-CM | POA: Diagnosis not present

## 2017-10-06 DIAGNOSIS — Z13220 Encounter for screening for lipoid disorders: Secondary | ICD-10-CM | POA: Diagnosis not present

## 2017-11-16 DIAGNOSIS — J0101 Acute recurrent maxillary sinusitis: Secondary | ICD-10-CM | POA: Diagnosis not present

## 2017-11-16 DIAGNOSIS — R05 Cough: Secondary | ICD-10-CM | POA: Diagnosis not present

## 2018-10-10 DIAGNOSIS — Z1231 Encounter for screening mammogram for malignant neoplasm of breast: Secondary | ICD-10-CM | POA: Diagnosis not present

## 2018-10-10 DIAGNOSIS — Z1329 Encounter for screening for other suspected endocrine disorder: Secondary | ICD-10-CM | POA: Diagnosis not present

## 2018-10-10 DIAGNOSIS — Z1151 Encounter for screening for human papillomavirus (HPV): Secondary | ICD-10-CM | POA: Diagnosis not present

## 2018-10-10 DIAGNOSIS — Z0000 Encounter for general adult medical examination without abnormal findings: Secondary | ICD-10-CM | POA: Diagnosis not present

## 2018-10-10 DIAGNOSIS — Z01419 Encounter for gynecological examination (general) (routine) without abnormal findings: Secondary | ICD-10-CM | POA: Diagnosis not present

## 2018-10-10 DIAGNOSIS — Z130 Encounter for screening for diseases of the blood and blood-forming organs and certain disorders involving the immune mechanism: Secondary | ICD-10-CM | POA: Diagnosis not present

## 2018-10-10 DIAGNOSIS — Z131 Encounter for screening for diabetes mellitus: Secondary | ICD-10-CM | POA: Diagnosis not present

## 2018-10-10 DIAGNOSIS — Z6824 Body mass index (BMI) 24.0-24.9, adult: Secondary | ICD-10-CM | POA: Diagnosis not present

## 2018-10-10 DIAGNOSIS — Z13220 Encounter for screening for lipoid disorders: Secondary | ICD-10-CM | POA: Diagnosis not present

## 2018-10-11 ENCOUNTER — Other Ambulatory Visit: Payer: Self-pay | Admitting: Obstetrics & Gynecology

## 2018-10-11 DIAGNOSIS — R921 Mammographic calcification found on diagnostic imaging of breast: Secondary | ICD-10-CM

## 2018-10-18 ENCOUNTER — Ambulatory Visit
Admission: RE | Admit: 2018-10-18 | Discharge: 2018-10-18 | Disposition: A | Discharge status: Home / Self Care | Payer: BLUE CROSS/BLUE SHIELD | Source: Ambulatory Visit | Attending: Obstetrics & Gynecology | Admitting: Obstetrics & Gynecology

## 2018-10-18 ENCOUNTER — Ambulatory Visit: Payer: BLUE CROSS/BLUE SHIELD

## 2018-10-18 ENCOUNTER — Other Ambulatory Visit: Payer: Self-pay

## 2018-10-18 ENCOUNTER — Other Ambulatory Visit: Payer: Self-pay | Admitting: Obstetrics & Gynecology

## 2018-10-18 DIAGNOSIS — R921 Mammographic calcification found on diagnostic imaging of breast: Secondary | ICD-10-CM

## 2018-10-18 DIAGNOSIS — R920 Mammographic microcalcification found on diagnostic imaging of breast: Secondary | ICD-10-CM | POA: Diagnosis not present

## 2018-10-20 ENCOUNTER — Ambulatory Visit
Admission: RE | Admit: 2018-10-20 | Discharge: 2018-10-20 | Disposition: A | Discharge status: Home / Self Care | Payer: BC Managed Care – PPO | Source: Ambulatory Visit | Attending: Obstetrics & Gynecology | Admitting: Obstetrics & Gynecology

## 2018-10-20 ENCOUNTER — Other Ambulatory Visit: Payer: Self-pay

## 2018-10-20 DIAGNOSIS — D0512 Intraductal carcinoma in situ of left breast: Secondary | ICD-10-CM | POA: Diagnosis not present

## 2018-10-20 DIAGNOSIS — C50412 Malignant neoplasm of upper-outer quadrant of left female breast: Secondary | ICD-10-CM | POA: Insufficient documentation

## 2018-10-20 DIAGNOSIS — Z170 Estrogen receptor positive status [ER+]: Secondary | ICD-10-CM | POA: Insufficient documentation

## 2018-10-20 DIAGNOSIS — R921 Mammographic calcification found on diagnostic imaging of breast: Secondary | ICD-10-CM

## 2018-10-20 DIAGNOSIS — C50419 Malignant neoplasm of upper-outer quadrant of unspecified female breast: Secondary | ICD-10-CM | POA: Insufficient documentation

## 2018-10-25 DIAGNOSIS — C50919 Malignant neoplasm of unspecified site of unspecified female breast: Secondary | ICD-10-CM | POA: Diagnosis not present

## 2018-10-26 ENCOUNTER — Encounter: Payer: Self-pay | Admitting: Adult Health

## 2018-10-31 DIAGNOSIS — Z803 Family history of malignant neoplasm of breast: Secondary | ICD-10-CM | POA: Diagnosis not present

## 2018-10-31 DIAGNOSIS — D0512 Intraductal carcinoma in situ of left breast: Secondary | ICD-10-CM | POA: Diagnosis not present

## 2018-11-02 ENCOUNTER — Other Ambulatory Visit: Payer: Self-pay | Admitting: Surgery

## 2018-11-02 ENCOUNTER — Ambulatory Visit
Admission: RE | Admit: 2018-11-02 | Discharge: 2018-11-02 | Disposition: A | Discharge status: Home / Self Care | Payer: BC Managed Care – PPO | Source: Ambulatory Visit | Attending: Surgery | Admitting: Surgery

## 2018-11-02 DIAGNOSIS — N6489 Other specified disorders of breast: Secondary | ICD-10-CM | POA: Diagnosis not present

## 2018-11-02 DIAGNOSIS — D493 Neoplasm of unspecified behavior of breast: Secondary | ICD-10-CM

## 2018-11-02 MED ORDER — GADOBUTROL 1 MMOL/ML IV SOLN
7.0000 mL | Freq: Once | INTRAVENOUS | Status: AC | PRN
  Administered 2018-11-02: 7 mL via INTRAVENOUS

## 2018-11-03 ENCOUNTER — Encounter: Payer: Self-pay | Admitting: Radiation Oncology

## 2018-11-03 DIAGNOSIS — Z1379 Encounter for other screening for genetic and chromosomal anomalies: Secondary | ICD-10-CM | POA: Insufficient documentation

## 2018-11-03 DIAGNOSIS — Z803 Family history of malignant neoplasm of breast: Secondary | ICD-10-CM | POA: Diagnosis not present

## 2018-11-03 DIAGNOSIS — D0512 Intraductal carcinoma in situ of left breast: Secondary | ICD-10-CM | POA: Diagnosis not present

## 2018-11-03 NOTE — Progress Notes (Signed)
Error, duplicate

## 2018-11-08 ENCOUNTER — Telehealth: Payer: Self-pay | Admitting: Oncology

## 2018-11-08 ENCOUNTER — Other Ambulatory Visit: Payer: Self-pay | Admitting: Oncology

## 2018-11-08 NOTE — Telephone Encounter (Signed)
Spoke with patient re 10/28 f/u with LC/GM. Date/time/LC per navigator. Per Navigator patient will see LC/GM 1st and then see Dr. Isidore Moos.

## 2018-11-09 ENCOUNTER — Inpatient Hospital Stay: Payer: BC Managed Care – PPO | Attending: Adult Health | Admitting: Adult Health

## 2018-11-09 ENCOUNTER — Encounter: Payer: Self-pay | Admitting: Radiation Oncology

## 2018-11-09 ENCOUNTER — Encounter: Payer: Self-pay | Admitting: Adult Health

## 2018-11-09 ENCOUNTER — Other Ambulatory Visit: Payer: Self-pay

## 2018-11-09 ENCOUNTER — Other Ambulatory Visit: Payer: Self-pay | Admitting: Radiation Oncology

## 2018-11-09 ENCOUNTER — Telehealth: Payer: Self-pay | Admitting: Genetic Counselor

## 2018-11-09 ENCOUNTER — Ambulatory Visit
Admission: RE | Admit: 2018-11-09 | Discharge: 2018-11-09 | Disposition: A | Discharge status: Home / Self Care | Payer: BC Managed Care – PPO | Source: Ambulatory Visit | Attending: Radiation Oncology | Admitting: Radiation Oncology

## 2018-11-09 VITALS — BP 102/68 | HR 73 | Temp 98.5°F | Resp 18 | Ht 66.0 in

## 2018-11-09 DIAGNOSIS — Z9013 Acquired absence of bilateral breasts and nipples: Secondary | ICD-10-CM | POA: Insufficient documentation

## 2018-11-09 DIAGNOSIS — Z800 Family history of malignant neoplasm of digestive organs: Secondary | ICD-10-CM | POA: Diagnosis not present

## 2018-11-09 DIAGNOSIS — C50412 Malignant neoplasm of upper-outer quadrant of left female breast: Secondary | ICD-10-CM

## 2018-11-09 DIAGNOSIS — D0512 Intraductal carcinoma in situ of left breast: Secondary | ICD-10-CM | POA: Diagnosis not present

## 2018-11-09 DIAGNOSIS — Z85820 Personal history of malignant melanoma of skin: Secondary | ICD-10-CM | POA: Insufficient documentation

## 2018-11-09 DIAGNOSIS — Z803 Family history of malignant neoplasm of breast: Secondary | ICD-10-CM | POA: Insufficient documentation

## 2018-11-09 DIAGNOSIS — Z170 Estrogen receptor positive status [ER+]: Secondary | ICD-10-CM

## 2018-11-09 NOTE — Progress Notes (Signed)
Location of Breast Cancer: Left Breast  Histology per Pathology Report:  10/20/18 Diagnosis Breast, left, needle core biopsy, tissue with calcifications - DUCTAL CARCINOMA IN SITU WITH CALCIFICATIONS AND NECROSIS.  Receptor Status: ER(60%), PR (NEG)  Did patient present with symptoms or was this found on screening mammography?: It was found on a screening mammogram.   Past/Anticipated interventions by surgeon, if any: 10/31/18 Dr. Malachi Paradise  Past/Anticipated interventions by medical oncology, if any:  11/09/18 Dr. Jana Hakim  Lymphedema issues, if any:  N/A  Pain issues, if any:  She denies.   SAFETY ISSUES:  Prior radiation? No  Pacemaker/ICD? No  Possible current pregnancy? She is taking a estrogen free birthcontrol (SLYND)  Is the patient on methotrexate? No  Current Complaints / other details:   She has a strong family history of breast cancer. 3 first degree relatives on both paternal and maternal sides having breast cancer in their 27's and 60's.   VS: 98.5 temp, BP 102/68, HR 73, Resp 18, O2 saturation 100% RA  Wt Readings from Last 3 Encounters:  11/09/18 148 lb 3.2 oz (67.2 kg)  07/30/16 139 lb (63 kg)  01/30/10 133 lb (60.3 kg)       Jaxx Huish, Stephani Police, RN 11/09/2018,3:42 PM

## 2018-11-09 NOTE — Progress Notes (Addendum)
[facility]  [phone] [phone]     ID: Stephanie [name] DOB: [birth year]  MR#: [id number]  MBB#:[id number]  Patient Care Team: Manon Hilding, MD as PCP - General (Family Medicine) Scot Dock, Stephanie [name] OTHER MD:  CHIEF COMPLAINT: new DCIS  CURRENT TREATMENT: pending surgery  HISTORY OF CURRENT ILLNESS:  Patient underwent routine baseline screening mammogram in early October that showed calcifications spanning 2cm in the upper outer left breast.  She was recommended stereotactic biopsy which was performed on 10/24/2018 and showed DCIS ER positive, PR negative.    The patient's subsequent history is as detailed below.  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  She is doing well today, and is here today for consultation and recommendations regarding her DCIS.  She has met with Dr. Brantley Stage along with Dr. Iran Planas.  She has completed a lot of research on her own about DCIS, and is now seeking medical advice on how best to proceed.    REVIEW OF SYSTEMS: Stephanie [name] is feeling very well today.  Though she is anxious about her new breast cancer diagnosis, she has no other health issues.  She is without fever or chills.  She has no cough, shortness of breath, chest pain, or palpitations.  She has normal bowel/bladder with no nausea, vomiting, or any other issues.  A detailed ROS was otherwise non contributory.  Stephanie [name] exercises regularly. She enjoys fitness classes and hunting and fishing with her husband.    PAST MEDICAL HISTORY: Past Medical History:  Diagnosis Date   Melanoma ([place])     PAST SURGICAL HISTORY: Past Surgical History:  Procedure Laterality Date   melanoma surgery     WISDOM TOOTH EXTRACTION      FAMILY HISTORY Family History  Problem Relation Age of Onset   Breast cancer Maternal Aunt    Breast cancer Maternal Grandmother    Breast cancer Paternal Grandmother    Lung  cancer Paternal Grandmother    Rectal cancer Paternal Grandmother    Hypertension Mother    Heart disease Father    Lung cancer Paternal Aunt    Lung cancer Paternal Grandfather     GYNECOLOGIC HISTORY:  Patient's last menstrual period was 10/11/2018. Menarche: 33 years old Age at first live birth: no children GX P 0 Contraceptive Loestrin, OCP for 12-13 years, now on Slynd Hysterectomy? no Salpingo-oophorectomy?no    SOCIAL HISTORY: Stephanie [name] is married and lives with her husband in [place], [place].  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 Stephanie [name] enjoys exercising, and running.  She also enjoys hunting and fishing with her husband along with spending time with her nieces and nephews.  Stephanie [name] does not plan on childbearing in the future.     ADVANCED DIRECTIVES:    HEALTH MAINTENANCE: Social History   Tobacco Use   Smoking status: Never Smoker   Smokeless tobacco: Never Used  Substance Use Topics   Alcohol use: Yes    Comment: occasional   Drug use: Never     Colonoscopy:never  PAP: 09/2018  Bone density: never   No Known Allergies  Current Outpatient Medications  Medication Sig Dispense Refill   MECLIZINE HCL PO Take by mouth as needed. Vertigo     SLYND 4 MG TABS      No current facility-administered medications for this visit.     OBJECTIVE:  Vitals:   11/09/18 1413  BP:  102/68  Pulse: 73  Resp: 18  Temp: 98.5 F (36.9 C)  SpO2: 100%     Body mass index is 22.44 kg/m.   Wt Readings from Last 3 Encounters:  07/30/16 139 lb (63 kg)  01/30/10 133 lb (60.3 kg)  ECOG FS:0 - Asymptomatic GENERAL: Patient is a well appearing female in no acute distress HEENT:  Sclerae anicteric.  Oropharynx clear and moist. No ulcerations or evidence of oropharyngeal candidiasis. Neck is supple.  NODES:  No cervical, supraclavicular, or axillary lymphadenopathy palpated.  BREAST EXAM:  Unable to palpate left  breast nodule/mass, left breast without any other abnormality, right breast is benign LUNGS:  Clear to auscultation bilaterally.  No wheezes or rhonchi. HEART:  Regular rate and rhythm. No murmur appreciated. ABDOMEN:  Soft, nontender.  Positive, normoactive bowel sounds. No organomegaly palpated. MSK:  No focal spinal tenderness to palpation. Full range of motion bilaterally in the upper extremities. EXTREMITIES:  No peripheral edema.   SKIN:  Clear with no obvious rashes or skin changes. No nail dyscrasia. NEURO:  Nonfocal. Well oriented.  Appropriate affect.     LAB RESULTS:  CMP  No results found for: NA, K, CL, CO2, GLUCOSE, BUN, CREATININE, CALCIUM, PROT, ALBUMIN, AST, ALT, ALKPHOS, BILITOT, GFRNONAA, GFRAA  No results found for: TOTALPROTELP, ALBUMINELP, A1GS, A2GS, BETS, BETA2SER, GAMS, MSPIKE, SPEI  No results found for: KPAFRELGTCHN, LAMBDASER, KAPLAMBRATIO  No results found for: WBC, NEUTROABS, HGB, HCT, MCV, PLT    Chemistry   No results found for: NA, K, CL, CO2, BUN, CREATININE, GLU No results found for: CALCIUM, ALKPHOS, AST, ALT, BILITOT     No results found for: LABCA2  No components found for: [id number]  No results for input(s): INR in the last 168 hours.  No results found for: LABCA2  No results found for: IDH686  No results found for: HUO372  No results found for: BMS111  No results found for: CA2729  No components found for: HGQUANT  No results found for: CEA1 / No results found for: CEA1   No results found for: AFPTUMOR  No results found for: CHROMOGRNA  No results found for: PSA1  No visits with results within 3 Day(s) from this visit.  Latest known visit with results is:  No results found for any previous visit.    (this displays the last labs from the last 3 days)  No results found for: TOTALPROTELP, ALBUMINELP, A1GS, A2GS, BETS, BETA2SER, GAMS, MSPIKE, SPEI (this displays SPEP labs)  No results found for: KPAFRELGTCHN,  LAMBDASER, KAPLAMBRATIO (kappa/lambda light chains)  No results found for: HGBA, HGBA2QUANT, HGBFQUANT, HGBSQUAN (Hemoglobinopathy evaluation)   No results found for: LDH  No results found for: IRON, TIBC, IRONPCTSAT (Iron and TIBC)  No results found for: FERRITIN  Urinalysis No results found for: COLORURINE, APPEARANCEUR, LABSPEC, PHURINE, GLUCOSEU, HGBUR, BILIRUBINUR, KETONESUR, PROTEINUR, UROBILINOGEN, NITRITE, LEUKOCYTESUR   STUDIES: Mr Breast Bilateral W [place] Cad  Result Date: 11/03/2018 CLINICAL DATA:  New dx dcis on lt, recent bx, seen on mammo, fam hx mat grandmother age 16, Paternal gm And maternal aunt age 35's, Hx of estrogen for 13 years LABS:  No labs drawn at time of imaging. EXAM: BILATERAL BREAST MRI WITH AND WITHOUT CONTRAST TECHNIQUE: Multiplanar, multisequence MR images of both breasts were obtained prior to and following the intravenous administration of 7 ml of Gadavist Three-dimensional MR images were rendered by post-processing of the original MR data on an independent workstation. The three-dimensional MR images were interpreted,  and findings are reported in the following complete MRI report for this study. Three dimensional images were evaluated at the independent DynaCad workstation COMPARISON:  Previous exam(s). FINDINGS: Breast composition: c. Heterogeneous fibroglandular tissue. Background parenchymal enhancement: Moderate. Right breast: No mass or abnormal enhancement. Left breast: Clumped non mass enhancement is seen in the left breast, upper outer quadrant, surrounding the post biopsy clip artifact, demonstrating mixed, predominantly wash-in and plateau, but some washout trauma type kinetics. The area measures 2.6 x 2.3 x 2.5 cm. There are no other areas of abnormal left breast enhancement. Lymph nodes: No abnormal appearing lymph nodes. Ancillary findings:  None. IMPRESSION: 1. Biopsy-proven left breast DCIS reflected by clumped non mass enhancement in  the upper outer left breast surrounding the post biopsy marker clip artifact, spanning 2.6 x 2.3 x 2.5 cm. 2. No other evidence of left breast malignancy. 3. No evidence of right breast malignancy. RECOMMENDATION: 1. Treatment as planned for the known left breast carcinoma. BI-RADS CATEGORY  6: Known biopsy-proven malignancy. Electronically Signed   By: Lajean Manes M.D.   On: 11/03/2018 10:20   Mm Digital Diagnostic Unilat L  Result Date: 10/18/2018 CLINICAL DATA:  Recall from screening mammogram for calcifications in the left breast. EXAM: DIGITAL DIAGNOSTIC LEFT MAMMOGRAM COMPARISON:  Previous exam(s). ACR Breast Density Category c: The breast tissue is heterogeneously dense, which may obscure small masses. FINDINGS: Grouped fine pleomorphic microcalcifications in the upper outer left breast at middle to posterior depth span 2.0 cm. IMPRESSION: Grouped microcalcifications in the left breast are suspicious for malignancy. RECOMMENDATION: Recommend stereotactic guided biopsy. I have discussed the findings and recommendations with the patient. If applicable, a reminder letter will be sent to the patient regarding the next appointment. BI-RADS CATEGORY  4: Suspicious. Electronically Signed   By: Zerita Boers M.D.   On: 10/18/2018 10:10   Mm Clip Placement Left  Result Date: 10/20/2018 CLINICAL DATA:  Post stereotactic core needle biopsy of left breast calcifications. EXAM: DIAGNOSTIC LEFT MAMMOGRAM POST STEREOTACTIC BIOPSY COMPARISON:  Previous exam(s). FINDINGS: Mammographic images were obtained following stereotactic guided biopsy of the left breast. The coil shaped biopsy marking clip is 2 cm medial to the biopsy cavity and residual calcifications. IMPRESSION: 2 cm medial displacement of the coil shaped biopsy marking clip post stereotactic core needle biopsy of left breast upper outer quadrant calcifications. Final Assessment: Post Procedure Mammograms for Marker Placement Electronically Signed   By:  Fidela Salisbury M.D.   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 This was found to be concordant by Dr. Fidela Salisbury. Pathology results were discussed with the patient by telephone. The patient reported doing well after the biopsy with tenderness at the site. Post biopsy instructions and care were reviewed and questions were answered. The patient was encouraged to call [facility] for any additional concerns. Per patient's request, surgical consultation has been arranged with Dr. Erroll Luna at [facility] on October 31, 2018. Pathology results reported by Stacie Acres, RN on 10/21/2018. Electronically Signed   By: Fidela Salisbury M.D.   On: 10/21/2018 15:43   Result Date: 10/21/2018 CLINICAL DATA:  Left breast upper outer quadrant calcifications. EXAM: LEFT BREAST STEREOTACTIC CORE NEEDLE BIOPSY COMPARISON:  Previous exams. FINDINGS:  The patient and I discussed the procedure of stereotactic-guided biopsy including benefits and alternatives. We discussed the high likelihood of a successful procedure. We discussed the risks of the procedure including infection, bleeding, tissue injury, clip migration, and inadequate sampling. Informed written consent was given. The usual time out protocol was performed immediately prior to the procedure. Using sterile technique and 1% Lidocaine as local anesthetic, under stereotactic guidance, a 9 gauge vacuum assisted device was used to perform core needle biopsy of calcifications in the upper-outer quadrant of the left breast. Initially, superior approach was chosen, however during positioning the breast slipped posteriorly and no successful sample was able to be  obtained. Subsequently, the group of calcifications was approached from lateral approach, and sampled successfully. Specimen radiograph was performed showing calcifications. Specimens with calcifications are identified for pathology. Lesion quadrant: Upper outer quadrant At the conclusion of the procedure, coil shaped tissue marker clip was deployed into the biopsy cavity. Follow-up 2-view mammogram was performed and dictated separately. IMPRESSION: Stereotactic-guided biopsy of left breast. No apparent complications. Given the extent of calcifications of approximately 2 cm., MRI of the breast without and with contrast may be considered to determine the true extent of disease. Electronically Signed: By: Fidela Salisbury M.D. On: 10/20/2018 12:41    ASSESSMENT: 33 y.o. woman with newly diagnosed left breast DCIS, ER positive biopsied on 10/24/2018   (1) Bilateral Breast MRI on 11/02/2018 shows biopsy proven left breast DCIS spanning 2.6 x 2.3 x 2.5 cm, no other left breast malignancy, no evidence of right breast malignancy  (2) Genetic testing reportedly negative (awaiting copy from [employer])  (3) Surgery   (4) Adjuvant radiation if appropriate  (4) Anti-estrogen therapy with Tamoxifen if appropriate  PLAN:    Stephanie [name] met with Dr. Jana Hakim who reviewed Astra's DCIS and her options for treatment of the DCIS.  He discussed her risk of recurrence, and the difference between DCIS and invasive breast cancer.  He reviewed the different options of surgery, when radiation is and isn't indicated, and also when Tamoxifen is and isn't indicated.  He also discussed with her a general overview of reconstruction options.    Stephanie [name] will consider our discussion as she moves forward with her options.  I have reached out to Dr. Brantley Stage and Dr. Para Skeans offices to help facilitate get her surgical codes as suggested by our breast navigators.    We will see Stephanie [name] back in 04/2019 after she has her surgery, or  sooner if needed.  She was recommended to continue with the appropriate pandemic precautions. Stephanie [name] has a good understanding of the overall plan. She agrees with it. She knows the goal of treatment in her case is cure. She will call with any problems that may develop before her next visit here.  Stephanie Bihari, Stephanie [name]   11/09/2018 2:35 PM Medical Oncology and Hematology [facility] [address] [place], [place] [address] Tel. [phone]    Fax. [phone]  ADDENDUM: Stephanie [name] is a 49 y/o [place], [place] woman with newly diagnosed DCIS. Stephanie [name] understands that in noninvasive ductal carcinoma, also called ductal carcinoma in situ ("DCIS") the breast cancer cells remain trapped in the ducts were they started. They cannot travel to a vital organ. For that reason these cancers in themselves are not life-threatening.  If the whole breast is removed then all the ducts are removed and since the cancer cells are trapped in the ducts, the cure rate with mastectomy for noninvasive breast cancer is approximately 99%. Nevertheless  we recommend lumpectomy, because there is no survival advantage to mastectomy and because the cosmetic result is generally superior with breast conservation.  In estrogen receptor positive cancers like Stephanie [name], anti-estrogens can also be considered. They will further reduce the risk of recurrence by one half. In addition anti-estrogens will lower the risk of a new breast cancer developing in either breast, also by one half. That risk approaches 1% per year. Anti-estrogens reduce it to 1/2%.  Stephanie [name] is also scheduled for genetics testing 11/15/2018. In patients who carry a deleterious mutation [for example in a  BRCA gene], the risk of a new breast cancer developing in the future may be sufficiently great that the patient may choose bilateral mastectomies. However if she wishes to keep her breasts in that situation it is safe to do so. That would require intensified screening,  which generally means not only yearly mammography but a yearly breast MRI as well. Of course, if there is a deleterious mutation bilateral oophorectomy would be necessary as there is no standard screening protocol for ovarian cancer.  Stephanie [name] is exploring the possibility of bilateral mastectomies with a clear understanding there is no difference in survival. The idea of intensified screening for many decades is unattractive to her, quite aside from the expense. We discussed the different types of mastectomy, including nipple-sparing surgery, and the different types of reconstruction. We also discussed insurance issues she continues to explore.  Tentatively she will return to see me in a few months by which time she should be done with local treatment and can consider anti-estrogens if appropriate. I will be glad to meet with her before then if that would be helpful to her decision-making.

## 2018-11-09 NOTE — Telephone Encounter (Signed)
R/s appt per 10/28 sch message - pt is aware of appt date and time.   

## 2018-11-09 NOTE — Progress Notes (Signed)
Radiation Oncology         [phone]) [phone] ________________________________  Initial outpatient Consultation  Name: Stephanie [name] MRN: [id number]  Date: 11/09/2018  DOB: 11-10-1985  EQ:ASTMHD, Silvestre Moment, MD  Erroll Luna, MD   REFERRING PHYSICIAN: Erroll Luna, MD  DIAGNOSIS:    ICD-10-CM   1. Ductal carcinoma in situ (DCIS) of left breast  D05.12    Stage 0 Left Breast UOQ DCIS, ER+ / PR-, Grade 2  CHIEF COMPLAINT: Here to discuss management of left breast DCIS  HISTORY OF PRESENT ILLNESS::Stephanie [name] is a 33 y.o. female who presented with breast abnormality on the following imaging: screening mammogram on the date of 10/10/2018.  Symptoms, if any, at that time, were: none.   Diagnostic mammogram on 10/18/2018 revealed grouped microcalcifications spanning 2 cm in the upper-outer left breast.   Biopsy on date of 10/20/2018 showed DCIS with calcifications and necrosis.  ER status: positive; PR status negative; Grade 2.  MRI confirms 2.6cm area of  abnormality in this area of the left breast only, amenable to breast conservation.  No abnormal nodes or signs of contralateral cancer.  Family history is significant, hence early screening performed - see FH below. She does have many aunts, however, that did not have breast cancer. She reports OB GYN obtained genetic testing and it was negative. She'd like to discuss future risk of cancer w/ counselor in genetics.  She has a h/o melanoma. She does not desire to bear children.  She has met with plastic surgery and med onc. Deciding between lumpectomy and b/l mastectomy.  She is in her USOH.  PREVIOUS RADIATION THERAPY: No  PAST MEDICAL HISTORY:  has a past medical history of Melanoma ([place]).    PAST SURGICAL HISTORY: Past Surgical History:  Procedure Laterality Date   melanoma surgery Right 2009   upper right arm   WISDOM TOOTH EXTRACTION      FAMILY HISTORY: family history includes Breast cancer in her maternal aunt, maternal  grandmother, and paternal grandmother; Heart disease in her father; Hypertension in her mother; Lung cancer in her paternal aunt, paternal grandfather, and paternal grandmother; Rectal cancer in her paternal grandmother.  SOCIAL HISTORY:  reports that she has never smoked. She has never used smokeless tobacco. She reports current alcohol use. She reports that she does not use drugs.  ALLERGIES: Patient has no known allergies.  MEDICATIONS:  Current Outpatient Medications  Medication Sig Dispense Refill   MECLIZINE HCL PO Take by mouth as needed. Vertigo     SLYND 4 MG TABS      No current facility-administered medications for this encounter.     REVIEW OF SYSTEMS: As above   PHYSICAL EXAM:  weight is 148 lb 3.2 oz (67.2 kg).   General: Alert and oriented, in no acute distress  Breast exam deferred  LABORATORY DATA:  No results found for: WBC, HGB, HCT, MCV, PLT CMP  No results found for: NA, K, CL, CO2, GLUCOSE, BUN, CREATININE, CALCIUM, PROT, ALBUMIN, AST, ALT, ALKPHOS, BILITOT, GFRNONAA, GFRAA       RADIOGRAPHY: Mr Breast Bilateral W Wo Contrast Inc Cad  Result Date: 11/03/2018 CLINICAL DATA:  New dx dcis on lt, recent bx, seen on mammo, fam hx mat grandmother age 19, Paternal gm And maternal aunt age 13's, Hx of estrogen for 13 years LABS:  No labs drawn at time of imaging. EXAM: BILATERAL BREAST MRI WITH AND WITHOUT CONTRAST TECHNIQUE: Multiplanar, multisequence MR images of both breasts were obtained  prior to and following the intravenous administration of 7 ml of Gadavist Three-dimensional MR images were rendered by post-processing of the original MR data on an independent workstation. The three-dimensional MR images were interpreted, and findings are reported in the following complete MRI report for this study. Three dimensional images were evaluated at the independent DynaCad workstation COMPARISON:  Previous exam(s). FINDINGS: Breast composition: c. Heterogeneous  fibroglandular tissue. Background parenchymal enhancement: Moderate. Right breast: No mass or abnormal enhancement. Left breast: Clumped non mass enhancement is seen in the left breast, upper outer quadrant, surrounding the post biopsy clip artifact, demonstrating mixed, predominantly wash-in and plateau, but some washout trauma type kinetics. The area measures 2.6 x 2.3 x 2.5 cm. There are no other areas of abnormal left breast enhancement. Lymph nodes: No abnormal appearing lymph nodes. Ancillary findings:  None. IMPRESSION: 1. Biopsy-proven left breast DCIS reflected by clumped non mass enhancement in the upper outer left breast surrounding the post biopsy marker clip artifact, spanning 2.6 x 2.3 x 2.5 cm. 2. No other evidence of left breast malignancy. 3. No evidence of right breast malignancy. RECOMMENDATION: 1. Treatment as planned for the known left breast carcinoma. BI-RADS CATEGORY  6: Known biopsy-proven malignancy. Electronically Signed   By: Lajean Manes M.D.   On: 11/03/2018 10:20   Mm Digital Diagnostic Unilat L  Result Date: 10/18/2018 CLINICAL DATA:  Recall from screening mammogram for calcifications in the left breast. EXAM: DIGITAL DIAGNOSTIC LEFT MAMMOGRAM COMPARISON:  Previous exam(s). ACR Breast Density Category c: The breast tissue is heterogeneously dense, which may obscure small masses. FINDINGS: Grouped fine pleomorphic microcalcifications in the upper outer left breast at middle to posterior depth span 2.0 cm. IMPRESSION: Grouped microcalcifications in the left breast are suspicious for malignancy. RECOMMENDATION: Recommend stereotactic guided biopsy. I have discussed the findings and recommendations with the patient. If applicable, a reminder letter will be sent to the patient regarding the next appointment. BI-RADS CATEGORY  4: Suspicious. Electronically Signed   By: Zerita Boers M.D.   On: 10/18/2018 10:10   Mm Clip Placement Left  Result Date: 10/20/2018 CLINICAL DATA:  Post  stereotactic core needle biopsy of left breast calcifications. EXAM: DIAGNOSTIC LEFT MAMMOGRAM POST STEREOTACTIC BIOPSY COMPARISON:  Previous exam(s). FINDINGS: Mammographic images were obtained following stereotactic guided biopsy of the left breast. The coil shaped biopsy marking clip is 2 cm medial to the biopsy cavity and residual calcifications. IMPRESSION: 2 cm medial displacement of the coil shaped biopsy marking clip post stereotactic core needle biopsy of left breast upper outer quadrant calcifications. Final Assessment: Post Procedure Mammograms for Marker Placement Electronically Signed   By: Fidela Salisbury M.D.   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 This was found to be concordant by Dr. Fidela Salisbury. Pathology results were discussed with the patient by telephone. The patient reported doing well after the biopsy with tenderness at the site. Post biopsy instructions and care were reviewed and questions were answered. The patient was encouraged to call [facility] for any additional concerns. Per patient's request, surgical consultation has been arranged with Dr. Erroll Luna at [facility] on October 31, 2018. Pathology results reported by Stacie Acres, RN on 10/21/2018. Electronically Signed   By: Thomas Hoff  Dimitrova M.D.   On: 10/21/2018 15:43   Result Date: 10/21/2018 CLINICAL DATA:  Left breast upper outer quadrant calcifications. EXAM: LEFT BREAST STEREOTACTIC CORE NEEDLE BIOPSY COMPARISON:  Previous exams. FINDINGS: The patient and I discussed the procedure of stereotactic-guided biopsy including benefits and alternatives. We discussed the high likelihood of a successful procedure.  We discussed the risks of the procedure including infection, bleeding, tissue injury, clip migration, and inadequate sampling. Informed written consent was given. The usual time out protocol was performed immediately prior to the procedure. Using sterile technique and 1% Lidocaine as local anesthetic, under stereotactic guidance, a 9 gauge vacuum assisted device was used to perform core needle biopsy of calcifications in the upper-outer quadrant of the left breast. Initially, superior approach was chosen, however during positioning the breast slipped posteriorly and no successful sample was able to be obtained. Subsequently, the group of calcifications was approached from lateral approach, and sampled successfully. Specimen radiograph was performed showing calcifications. Specimens with calcifications are identified for pathology. Lesion quadrant: Upper outer quadrant At the conclusion of the procedure, coil shaped tissue marker clip was deployed into the biopsy cavity. Follow-up 2-view mammogram was performed and dictated separately. IMPRESSION: Stereotactic-guided biopsy of left breast. No apparent complications. Given the extent of calcifications of approximately 2 cm., MRI of the breast without and with contrast may be considered to determine the true extent of disease. Electronically Signed: By: Fidela Salisbury M.D. On: 10/20/2018 12:41      IMPRESSION/PLAN: Left Breast DCIS   I will refer her to genetic counseling as she would like to understand her risk of other breast cancers/ DCIS later in life and also screening recommendations based on her history and family history. Note outside testing is complete, and negative per patient.  She understands it is very unlikely she will need RT if she undergoes mastectomy.  She understands that antiestrogens and about 6 weeks of RT would be recommended if she pursues breast conserving surgery.  We spoke about acute effects including skin irritation and  fatigue as well as much less common late effects including internal organ injury or irritation. Secondary malignancy is very rare. We spoke about the latest technology that is used to minimize the risk of late effects for patients undergoing radiotherapy to the breast or chest wall. No guarantees of treatment were given.   I will await her referral back to me for postoperative follow-up and eventual CT simulation/treatment planning if indicated.  She appreciated our discussion and I wished her the very best.  I spent 45 minutes  face to face with the patient and more than 50% of that time was spent in counseling and/or coordination of care.   __________________________________________   Eppie Gibson, MD   This document serves as a record of services personally performed by Eppie Gibson, MD. It was created on her behalf by Wilburn Mylar, a trained medical scribe. The creation of this record is based on the scribe's personal observations and the provider's statements to them. This document has been checked and approved by the attending provider.

## 2018-11-10 ENCOUNTER — Telehealth: Payer: Self-pay | Admitting: Oncology

## 2018-11-10 ENCOUNTER — Encounter: Payer: Self-pay | Admitting: Radiation Oncology

## 2018-11-10 ENCOUNTER — Telehealth: Payer: Self-pay | Admitting: *Deleted

## 2018-11-10 DIAGNOSIS — D0512 Intraductal carcinoma in situ of left breast: Secondary | ICD-10-CM | POA: Insufficient documentation

## 2018-11-10 NOTE — Telephone Encounter (Signed)
I left a message regarding 4/5

## 2018-11-10 NOTE — Telephone Encounter (Signed)
Called patient to inform of genetics appt. for 11-15-18 @ 9 am with genetics rep. Clint Guy, lvm for a return call

## 2018-11-11 ENCOUNTER — Ambulatory Visit: Payer: Self-pay | Admitting: Surgery

## 2018-11-11 DIAGNOSIS — D0512 Intraductal carcinoma in situ of left breast: Secondary | ICD-10-CM | POA: Diagnosis not present

## 2018-11-11 DIAGNOSIS — Z803 Family history of malignant neoplasm of breast: Secondary | ICD-10-CM | POA: Diagnosis not present

## 2018-11-11 NOTE — H&P (Signed)
Stephanie [name] Documented: 11/11/2018 12:01 PM Location: [facility] Patient #: [id number] DOB: [birth year] Married / Language: English / Race: White Female  History of Present Illness Marcello Moores A. Keagan Brislin MD; 11/11/2018 12:41 PM) Patient words: Patient returns for follow-up of her left breast DCIS. Magnetic resonance imaging showed the area to be about 2-1/2 cm in maximal diameter. There is no other abnormality noted. She has been thinking extensively about bilateral nipple preserving mastectomy with reconstruction. She still not decided that she is leaning that way. We had extensive discussion of the pros and cons of this to include nipple loss, flap loss, and long-term expectations about being insensate and other potential complications. We discussed breast conserving treatment as well in the long-term implications of tamoxifen use and she is chemoprevention and radiation therapy. She is still undecided but is leaning toward bilateral mastectomy with the right being prophylactic given her elevated risk of breast cancer being well over 25-30% if not higher given her young age first-degree relative with breast cancer. Her genetics are not yet back.  The patient is a 33 year old female.   Allergies (Tanisha A. Owens Shark, [place]; 11/11/2018 12:01 PM) No Known Allergies [10/31/2018]: Allergies Reconciled  Medication History (Tanisha A. Owens Shark, [place]; 11/11/2018 12:01 PM) Slynd (4MG  Tablet, Oral) Active. Multiple Vitamins/Iron (1 (one) Oral) Active. Antivert (12.5MG  Tablet, Oral) Active. Medications Reconciled    Vitals (Tanisha A. Brown RMA; 11/11/2018 12:01 PM) 11/11/2018 12:01 PM Weight: 149.4 lb Height: 66in Body Surface Area: 1.77 m Body Mass Index: 24.11 kg/m  Temp.: 97.70F  Pulse: 93 (Regular)  BP: 128/84 (Sitting, Left Arm, Standard)        Physical Exam (Taheera Thomann A. Anthonio Mizzell MD; 11/11/2018 12:42 PM)  General Note: Atraumatic. Pleasant  Breast Note:  Not reexamined today  Neurologic Neurologic evaluation reveals -alert and oriented x 3 with no impairment of recent or remote memory. Mental Status-Normal.    Assessment & Plan (Reed Dady A. Dayna Alia MD; 11/11/2018 12:43 PM)  BREAST NEOPLASM, TIS (DCIS), LEFT (D05.12) Impression: Spent 30 minutes discussing treatment options in counseling today. She is still undecided but leaning toward bilateral nipple preserving mastectomy. With a long conversation about this and compared this to breast conserving treatment options as well. She will let us know her where she wants to do but is leaning toward bilateral nipple preserving mastectomy with the right being prophylactic to reduce her lifetime risk of up to 40% of developing breast cancer. Discussed treatment options for breast cancer to include breast conservation vs mastectomy with reconstruction. Pt has decided on mastectomy. Risk include bleeding, infection, flap necrosis, pain, numbness, recurrence, hematoma, other surgery needs. Pt understands and agrees to proceed. Risk of sentinel lymph node mapping include bleeding, infection, lymphedema, shoulder pain. stiffness, dye allergy. cosmetic deformity , blood clots, death, need for more surgery. Pt agres to proceed.  Current Plans Pt Education - [facility] Mastectomy [facility]CI Pt Education - ABC (After Breast Cancer) Class Info: discussed with patient and provided information. Pt Education - [facility] Breast Cancer Information Given - [employer] "Breast Journey" Package  FAMILY HISTORY OF BREAST CANCER IN FIRST DEGREE RELATIVE (Z80.3)

## 2018-11-14 ENCOUNTER — Telehealth: Payer: Self-pay | Admitting: Genetic Counselor

## 2018-11-14 ENCOUNTER — Other Ambulatory Visit: Payer: Self-pay | Admitting: Oncology

## 2018-11-14 NOTE — Telephone Encounter (Signed)
Opened by accident

## 2018-11-14 NOTE — Telephone Encounter (Signed)
Called patient regarding upcoming Webex appointment, per patient's request will be a virtual visit. E-mail has been sent.

## 2018-11-15 ENCOUNTER — Inpatient Hospital Stay: Payer: BC Managed Care – PPO | Attending: Genetic Counselor | Admitting: Genetic Counselor

## 2018-11-15 ENCOUNTER — Other Ambulatory Visit: Payer: BC Managed Care – PPO

## 2018-11-15 ENCOUNTER — Encounter: Payer: Self-pay | Admitting: Genetic Counselor

## 2018-11-15 DIAGNOSIS — Z801 Family history of malignant neoplasm of trachea, bronchus and lung: Secondary | ICD-10-CM | POA: Insufficient documentation

## 2018-11-15 DIAGNOSIS — Z800 Family history of malignant neoplasm of digestive organs: Secondary | ICD-10-CM | POA: Insufficient documentation

## 2018-11-15 DIAGNOSIS — C50412 Malignant neoplasm of upper-outer quadrant of left female breast: Secondary | ICD-10-CM

## 2018-11-15 DIAGNOSIS — Z1379 Encounter for other screening for genetic and chromosomal anomalies: Secondary | ICD-10-CM

## 2018-11-15 DIAGNOSIS — Z803 Family history of malignant neoplasm of breast: Secondary | ICD-10-CM

## 2018-11-15 DIAGNOSIS — Z170 Estrogen receptor positive status [ER+]: Secondary | ICD-10-CM

## 2018-11-15 NOTE — Progress Notes (Signed)
REFERRING PROVIDER: Eppie Gibson, MD [address],  [place] [address]  PRIMARY PROVIDER:  Manon Hilding, MD  PRIMARY REASON FOR VISIT:  1. Genetic testing   2. Malignant neoplasm of upper-outer quadrant of left breast in female, estrogen receptor positive ([place])   3. Family history of breast cancer   4. Family history of rectal cancer   5. Family history of lung cancer     I connected with Stephanie [name] on 11/15/2018 at 9:00 am EDT by [employer] video conference and verified that I am speaking with the correct person using two identifiers.   Patient location: home Provider location: office  HISTORY OF PRESENT ILLNESS:   Stephanie [name], a 33 y.o. female, was seen for a [facility] cancer genetics consultation at the request of Dr. Isidore Moos due to a personal history of negative genetic testing.  Stephanie [name] presents to clinic today to discuss the possibility of a hereditary predisposition to cancer, genetic testing, and to further clarify her future cancer risks, as well as potential cancer risks for family members.   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 The treatment plan includes surgery. Stephanie [name] was also diagnosed with melanoma on her upper arm in 2009 that was surgically removed.  Stephanie [name] recently had genetic testing completed that was ordered by Dr. Benjie Karvonen at [facility]. These results and recommendations are discussed in more detail below.   CANCER HISTORY:  Oncology History  Malignant neoplasm of upper-outer quadrant of left breast in female, estrogen receptor positive ([place])  10/20/2018 Initial Diagnosis   Malignant neoplasm of upper-outer quadrant of left breast in female, estrogen receptor positive ([place])   10/20/2018 Cancer Staging   Staging form: Breast, AJCC 8th Edition - Clinical stage from 10/20/2018: Stage 0 (cTis (DCIS), cN0, cM0, ER+, PR-) - Signed by Gardenia Phlegm, NP on 10/26/2018   11/03/2018 Genetic Testing   Negative genetic testing:  No pathogenic variants detected on the [facility] Multi-Cancer panel, ordered by Dr. Benjie Karvonen at [facility]. The report date is 11/03/2018.  The Multi-Cancer Panel offered by [facility] includes sequencing and/or deletion duplication testing of the following 84 genes: AIP, ALK, APC, ATM, AXIN2,BAP1,  BARD1, BLM, BMPR1A, BRCA1, BRCA2, BRIP1, CASR, CDC73, CDH1, CDK4, CDKN1B, CDKN1C, CDKN2A (p14ARF), CDKN2A (p16INK4a), CEBPA, CHEK2, CTNNA1, DICER1, DIS3L2, EGFR (c.2369C>T, p.Thr790Met variant only), EPCAM (Deletion/duplication testing only), FH, FLCN, GATA2, GPC3, GREM1 (Promoter region deletion/duplication testing only), HOXB13 (c.251G>A, p.Gly84Glu), HRAS, KIT, MAX, MEN1, MET, MITF (c.952G>A, p.Glu318Lys variant only), MLH1, MSH2, MSH3, MSH6, MUTYH, NBN, NF1, NF2, NTHL1, PALB2, PDGFRA, PHOX2B, PMS2, POLD1, POLE, POT1, PRKAR1A, PTCH1, PTEN, RAD50, RAD51C, RAD51D, RB1, RECQL4, RET, RUNX1, SDHAF2, SDHA (sequence changes only), SDHB, SDHC, SDHD, SMAD4, SMARCA4, SMARCB1, SMARCE1, STK11, SUFU, TERC, TERT, TMEM127, TP53, TSC1, TSC2, VHL, WRN and WT1.      RISK FACTORS:  Menarche was at age 13.  No live births.  OCP use for approximately 12-13 years.  Ovaries intact: yes.  Hysterectomy: no.  Menopausal status: premenopausal.  HRT use: 0 years. Colonoscopy: no. Mammogram within the last year: yes. Number of breast biopsies: 1. Up to date with pelvic exams: yes. Any excessive radiation exposure in the past: no  Past Medical History:  Diagnosis Date   Family history of breast cancer    Family history of lung cancer    Family history of rectal cancer    Melanoma ([place])     Past  Surgical History:  Procedure Laterality Date   melanoma surgery Right 2009   upper right arm   WISDOM TOOTH EXTRACTION      Social History   Socioeconomic History   Marital status: Married    Spouse name: Not on file   Number of children: Not on file   Years of education:  Not on file   Highest education level: Not on file  Occupational History   Not on file  Social Needs   Financial resource strain: Not on file   Food insecurity    Worry: Not on file    Inability: Not on file   Transportation needs    Medical: No    Non-medical: No  Tobacco Use   Smoking status: Never Smoker   Smokeless tobacco: Never Used  Substance and Sexual Activity   Alcohol use: Yes    Comment: occasional   Drug use: Never   Sexual activity: Yes  Lifestyle   Physical activity    Days per week: Not on file    Minutes per session: Not on file   Stress: Not on file  Relationships   Social connections    Talks on phone: Not on file    Gets together: Not on file    Attends religious service: Not on file    Active member of club or organization: Not on file    Attends meetings of clubs or organizations: Not on file    Relationship status: Not on file  Other Topics Concern   Not on file  Social History Narrative   Not on file     FAMILY HISTORY:  We obtained a detailed, 4-generation family history.  Significant diagnoses are listed below: Family History  Problem Relation Age of Onset   Breast cancer Maternal Aunt        diagnosed 68s   Breast cancer Maternal Grandmother        diagnosed 56s   Breast cancer Paternal Grandmother        diagnosed 53s   Lung cancer Paternal Grandmother        thought to be breast cancer mets   Rectal cancer Paternal Grandmother        diagnosed 42s   Hypertension Mother    Heart disease Father    Lung cancer Paternal Aunt        diagnosed late 75s   Lung cancer Paternal Grandfather        diagnosed 57s    Stephanie [name] does not have children. She has two full sisters and one half brother, as well as nieces and nephews, although none of these individuals have had cancer.   Stephanie [name]'s mother is currently 92 and has not had cancer. She has five maternal aunts and five maternal uncles. One aunt had breast cancer  diagnosed in her 67s. Her maternal grandmother also had breast cancer diagnosed in her 105s. There are no other known diagnoses of cancer on the maternal side of the family.  Stephanie [name]'s father is currently 3 and has not had cancer. She has three paternal aunts and three paternal uncles, some of whom are half-siblings to her father. One aunt was diagnosed with lung cancer in her late 24s and was a smoker on and off. Her paternal grandmother had breast cancer diagnosed in her 2s and rectal cancer diagnosed in her 5s, with identified metastasis to the lungs that is believed to be from the breast cancer. Her paternal grandfather had lung cancer diagnosed in  his 44s. There are no other known diagnoses of cancer on the paternal side of the family.  Stephanie [name] is aware of previous family history of genetic testing for hereditary cancer risks. Her mother and sisters are also having genetic testing completed, although they have not received their results yet. Stephanie [name] is unsure of her ancestry. There is no reported Ashkenazi Jewish ancestry. There is no known consanguinity.  DISCUSSION:  In general, most cancer is not inherited, but instead is sporadic or familial. Sporadic cancers occur by chance and typically happen at older ages (>50 years) as this type of cancer is caused by genetic changes acquired during an individuals lifetime. Some families have more cancers than would be expected by chance; however, the ages or types of cancer are not consistent with a known genetic mutation or known genetic mutations have been ruled out. This type of familial cancer is thought to be due to a combination of multiple genetic, environmental, hormonal, and lifestyle factors. While this combination of factors likely increases the risk of cancer, the exact source of this risk is not currently identifiable or testable.   Approximately 5-10% of breast cancer is hereditary, meaning that it is due to a pathogenic variant in a single  gene that is passed down from generation to generation.  Most cases of hereditary breast cancer are associated with the BRCA1 and BRCA2 genes.  There are other genes that can be associated with hereditary breast cancer syndromes.  These include ATM, CHEK2, PALB2, etc.  We discussed that identifying a hereditary cancer syndrome is beneficial for several reasons including knowing about other cancer risks, identifying potential screening and risk-reduction options that may be appropriate, and to understand if other family members could be at risk for cancer and allow them to undergo genetic testing.   GENETIC TEST RESULTS: Genetic testing reported out on 11/03/2018 through the [facility] Multi-Cancer panel found no pathogenic variants. The Multi-Cancer Panel offered by [facility] includes sequencing and/or deletion duplication testing of the following 84 genes: AIP, ALK, APC, ATM, AXIN2,BAP1,  BARD1, BLM, BMPR1A, BRCA1, BRCA2, BRIP1, CASR, CDC73, CDH1, CDK4, CDKN1B, CDKN1C, CDKN2A (p14ARF), CDKN2A (p16INK4a), CEBPA, CHEK2, CTNNA1, DICER1, DIS3L2, EGFR (c.2369C>T, p.Thr790Met variant only), EPCAM (Deletion/duplication testing only), FH, FLCN, GATA2, GPC3, GREM1 (Promoter region deletion/duplication testing only), HOXB13 (c.251G>A, p.Gly84Glu), HRAS, KIT, MAX, MEN1, MET, MITF (c.952G>A, p.Glu318Lys variant only), MLH1, MSH2, MSH3, MSH6, MUTYH, NBN, NF1, NF2, NTHL1, PALB2, PDGFRA, PHOX2B, PMS2, POLD1, POLE, POT1, PRKAR1A, PTCH1, PTEN, RAD50, RAD51C, RAD51D, RB1, RECQL4, RET, RUNX1, SDHAF2, SDHA (sequence changes only), SDHB, SDHC, SDHD, SMAD4, SMARCA4, SMARCB1, SMARCE1, STK11, SUFU, TERC, TERT, TMEM127, TP53, TSC1, TSC2, VHL, WRN and WT1.   A portion of the result report is included below for reference.    We discussed with Stephanie [name] that because current genetic testing is not perfect, it is possible there may be a gene mutation in one of these genes that current testing cannot detect, but that chance is small.  We also  discussed, that there could be another gene that has not yet been discovered, or that we have not yet tested, that is responsible for the cancer diagnoses in the family. It is also possible there is a hereditary cause for the cancer in the family that Stephanie [name] did not inherit and therefore was not identified in her testing.  Therefore, it is important to remain in touch with cancer genetics in the future so that we can continue to offer Stephanie [name] the most up to date  genetic testing.   ADDITIONAL GENETIC TESTING:  We discussed with Stephanie [name] that her genetic testing was fairly extensive.  If there are genes identified to increase cancer risk that can be analyzed in the future, we would be happy to discuss and coordinate this testing at that time.    CANCER MANAGEMENT & SCREENING RECOMMENDATIONS: Stephanie [name]'s test result is considered negative (normal).  This means that we have not identified a hereditary cause for her personal and family history of cancer at this time.  While reassuring, this does not definitively rule out a hereditary predisposition to cancer.  It is still possible that there could be genetic mutations that are undetectable by current technology.  There could be genetic mutations in genes that have not been tested or identified to increase cancer risk.  Additionally, because of her young age of diagnosis and because there is a family history of breast cancer, she may still be at an increased risk for a second breast cancer, despite her negative results.  Therefore, it is recommended she continue to follow the cancer management and screening guidelines provided by her oncology and primary healthcare providers.    RECOMMENDATIONS FOR FAMILY MEMBERS:  Individuals in this family might be at some increased risk of developing cancer, over the general population risk, simply due to the family history of cancer.  We recommended women in this family have a yearly mammogram beginning at age 58, or 78 years  younger than the earliest onset of cancer, an annual clinical breast exam, and perform monthly breast self-exams. Women in this family should also have a gynecological exam as recommended by their primary provider. All family members should have a colonoscopy by age 38.  It is also possible there is a hereditary cause for the cancer in Stephanie [name]'s family that she did not inherit and therefore was not identified in her.  Her sisters and mother are already in the process of genetic testing.  We discussed that, given Ms. Kjos's negative results, there is a lower likelihood that a hereditary breast cancer syndrome is identified in these individuals, but that it is still possible.   FOLLOW-UP: Lastly, we discussed with Ms. Wierzba that cancer genetics is a rapidly advancing field and it is possible that new genetic tests will be appropriate for her and/or her family members in the future. We encouraged her to remain in contact with cancer genetics on an annual basis so we can update her personal and family histories and let her know of advances in cancer genetics that may benefit this family.   Our contact number was provided. Ms. Wauters's questions were answered to her satisfaction, and she knows she is welcome to call us at anytime with additional questions or concerns.    Clint Guy, MS, [facility] Certified Genetic Counselor [email or web address] Phone: [phone]  The patient was seen for a total of 50 minutes in face-to-face genetic counseling.  This patient was discussed with Drs. Magrinat, Lindi Adie and/or Burr Medico who agrees with the above.    _______________________________________________________________________ For Office Staff:  Number of people involved in session: 1 Was an Intern/ student involved with case: no

## 2018-11-16 ENCOUNTER — Ambulatory Visit: Payer: BC Managed Care – PPO | Admitting: Oncology

## 2018-11-16 ENCOUNTER — Encounter: Payer: Self-pay | Admitting: Genetic Counselor

## 2018-11-16 ENCOUNTER — Other Ambulatory Visit: Payer: BC Managed Care – PPO

## 2018-11-16 ENCOUNTER — Encounter: Payer: Self-pay | Admitting: *Deleted

## 2018-11-23 ENCOUNTER — Other Ambulatory Visit: Payer: BC Managed Care – PPO

## 2018-11-25 ENCOUNTER — Telehealth: Payer: Self-pay | Admitting: Oncology

## 2018-11-25 NOTE — Telephone Encounter (Signed)
Scheduled apt per 11/12 sch message - unable to reach pt . Left message with new appt date and time

## 2019-01-02 DIAGNOSIS — Z803 Family history of malignant neoplasm of breast: Secondary | ICD-10-CM | POA: Diagnosis not present

## 2019-01-02 DIAGNOSIS — D0512 Intraductal carcinoma in situ of left breast: Secondary | ICD-10-CM | POA: Diagnosis not present

## 2019-01-02 NOTE — H&P (Signed)
Subjective:     Patient ID: Stephanie [name] is a 33 y.o. female.  HPI  Here for follow up discussion breast reconstruction prior to planned bilateral mastectomies.. Presented following screening MMG with calcifications left breast. Diagnostic MG showed left breast UOQ calcifications spanning 2 cm. Biopsy demonstrated intermediate grade DCIS, ER+/PR-.  MRI showed biopsy-proven left breast DCIS reflected by clumped NME in the upper outer left breast spanning 2.6 x 2.3 x 2.5 cm.  Genetics negative.  Multiple members of family with breast ca and desires bilateral mastectomies.  Current  36 B, happy with this. Wt up 5-7 lb over last year, states current is her highest weight.  PMH significant for melanoma- right arm, followed by [facility] yearly for 10 years.  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 Lives with husband, no children.  Review of Systems Remainder 12 point review negative    Objective:   Physical Exam  Cardiovascular: Normal rate, regular rhythm and normal heart sounds.  Pulmonary/Chest: Effort normal and breath sounds normal.  Lymphadenopathy:    She has no axillary adenopathy.  Skin:  + Fitzpatrick 2  Physical Exam  Cardiovascular: Normal rate, regular rhythm and normal heart sounds.   Pulmonary/Chest Effort normal and breath sounds normal.   Skin  + Fitzpatrick 2  no palpable masses No ptosis bilateral SN to nipple R 25 L 25 cm BW R 17 L 17 cm CW 12 cm Nipple to IMF R 7 L 7 cm    Assessment:     Left breast ca DCIS ER+ Family history breast ca    Plan:     Plan bilateral nipple sparing mastectomies with immediate expander ADM reconstruction.  Discussed use of acellular dermis in reconstruction, cadaveric source, incorporation over several weeks, risk that if has seroma or infection can act as additional nidus for infection if not incorporated.Reviewed this is an off label use of acellular  dermis.  Reviewed incisions, drains, OR length, hospital stay and recovery/post op limitations. Discussed process of expansion and implant based risks including rupture, MRI surveillance for silicone implants, infection requiring surgery or removal, contracture. Reviewedmy preference forstaged reconstruction with expander. This does mean additional surgery for implant placement.  Reviewedreconstructionwill be asensate and not stimulate. Reviewed risks mastectomy flap necrosis requiring additional surgery.   Reviewedprepectoral vs sub pectoral reconstruction. Discussed with patient and benefit of this is no animation deformity, may be less pain. Risk may be more visible rippling over upper poles, greater need of ADM. Reviewed pre pectoral would require larger amount acellular dermis, more drains. Discussed any type reconstruction also risks long term displacement implant and visible rippling. If prepectoral counseled I would recommend she be comfortable with silicone implants as more options that have less rippling. Sheagrees to prepectoral placement.  Discussed risk COVID infectionthrough this elective surgery. Patient will receive COVID testing prior to surgery. Discussed even if patient receivesa negative test result, the tests in some cases may fail to detect the virus or patient maycontract COVID after the test.COVID 19 infectionbefore/during/aftersurgery may result in lead to a higher chance of complication and death.  Additional risks seroma, hematoma, damage to adjacent structures, asymmetry, DVT/PE, cardiopulmonary complications, unacceptable cosmetic result reviewed.  Rx for [facility] [place] given.

## 2019-01-03 ENCOUNTER — Other Ambulatory Visit: Payer: Self-pay

## 2019-01-03 ENCOUNTER — Encounter (HOSPITAL_BASED_OUTPATIENT_CLINIC_OR_DEPARTMENT_OTHER): Payer: Self-pay | Admitting: Surgery

## 2019-01-12 ENCOUNTER — Encounter (HOSPITAL_BASED_OUTPATIENT_CLINIC_OR_DEPARTMENT_OTHER)
Admission: RE | Admit: 2019-01-12 | Discharge: 2019-01-12 | Disposition: A | Discharge status: Home / Self Care | Payer: BC Managed Care – PPO | Source: Ambulatory Visit | Attending: Surgery | Admitting: Surgery

## 2019-01-12 ENCOUNTER — Other Ambulatory Visit: Payer: Self-pay

## 2019-01-12 DIAGNOSIS — D0512 Intraductal carcinoma in situ of left breast: Secondary | ICD-10-CM | POA: Diagnosis not present

## 2019-01-12 LAB — COMPREHENSIVE METABOLIC PANEL
ALT: 24 U/L (ref 0–44)
AST: 23 U/L (ref 15–41)
Albumin: 4.1 g/dL (ref 3.5–5.0)
Alkaline Phosphatase: 98 U/L (ref 38–126)
Anion gap: 6 (ref 5–15)
BUN: 12 mg/dL (ref 6–20)
CO2: 25 mmol/L (ref 22–32)
Calcium: 9.2 mg/dL (ref 8.9–10.3)
Chloride: 109 mmol/L (ref 98–111)
Creatinine, Ser: 1.09 mg/dL — ABNORMAL HIGH (ref 0.44–1.00)
GFR calc Af Amer: >60 mL/min (ref >60)
GFR calc non Af Amer: >60 mL/min (ref >60)
Glucose, Bld: 85 mg/dL (ref 70–99)
Potassium: 4.8 mmol/L (ref 3.5–5.1)
Sodium: 140 mmol/L (ref 135–145)
Total Bilirubin: 0.6 mg/dL (ref 0.3–1.2)
Total Protein: 6.8 g/dL (ref 6.5–8.1)

## 2019-01-12 LAB — CBC WITH DIFFERENTIAL/PLATELET
Abs Immature Granulocytes: 0.03 10*3/uL (ref 0.00–0.07)
Basophils Absolute: 0.1 10*3/uL (ref 0.0–0.1)
Basophils Relative: 1 %
Eosinophils Absolute: 0.1 10*3/uL (ref 0.0–0.5)
Eosinophils Relative: 1 %
HCT: 41.2 % (ref 36.0–46.0)
Hemoglobin: 13 g/dL (ref 12.0–15.0)
Immature Granulocytes: 0 %
Lymphocytes Relative: 25 %
Lymphs Abs: 2.1 10*3/uL (ref 0.7–4.0)
MCH: 25.7 pg — ABNORMAL LOW (ref 26.0–34.0)
MCHC: 31.6 g/dL (ref 30.0–36.0)
MCV: 81.6 fL (ref 80.0–100.0)
Monocytes Absolute: 0.4 10*3/uL (ref 0.1–1.0)
Monocytes Relative: 5 %
Neutro Abs: 5.7 10*3/uL (ref 1.7–7.7)
Neutrophils Relative %: 68 %
Platelets: 263 10*3/uL (ref 150–400)
RBC: 5.05 MIL/uL (ref 3.87–5.11)
RDW: 13.6 % (ref 11.5–15.5)
WBC: 8.5 10*3/uL (ref 4.0–10.5)
nRBC: 0 % (ref 0.0–0.2)

## 2019-01-12 LAB — POCT PREGNANCY, URINE: Preg Test, Ur: NEGATIVE

## 2019-01-12 NOTE — Progress Notes (Signed)

## 2019-01-14 ENCOUNTER — Other Ambulatory Visit (HOSPITAL_COMMUNITY)
Admission: RE | Admit: 2019-01-14 | Discharge: 2019-01-14 | Disposition: A | Discharge status: Home / Self Care | Payer: BC Managed Care – PPO | Source: Ambulatory Visit | Attending: Surgery | Admitting: Surgery

## 2019-01-14 DIAGNOSIS — Z01812 Encounter for preprocedural laboratory examination: Secondary | ICD-10-CM | POA: Insufficient documentation

## 2019-01-14 DIAGNOSIS — Z20822 Contact with and (suspected) exposure to covid-19: Secondary | ICD-10-CM | POA: Insufficient documentation

## 2019-01-14 LAB — SARS CORONAVIRUS 2 (TAT 6-24 HRS): SARS Coronavirus 2: NEGATIVE

## 2019-01-16 ENCOUNTER — Encounter (HOSPITAL_BASED_OUTPATIENT_CLINIC_OR_DEPARTMENT_OTHER): Payer: Self-pay | Admitting: Surgery

## 2019-01-16 NOTE — Anesthesia Preprocedure Evaluation (Addendum)
Anesthesia Evaluation  Patient identified by MRN, date of birth, ID band Patient awake    Reviewed: Allergy & Precautions, NPO status , Patient's Chart, lab work & pertinent test results  History of Anesthesia Complications (+) PONV and history of anesthetic complications  Airway Mallampati: I       Dental no notable dental hx. (+) Teeth Intact   Pulmonary neg pulmonary ROS,    Pulmonary exam normal breath sounds clear to auscultation       Cardiovascular Normal cardiovascular exam Rhythm:Regular Rate:Normal     Neuro/Psych negative neurological ROS  negative psych ROS   GI/Hepatic negative GI ROS, Neg liver ROS,   Endo/Other  negative endocrine ROS  Renal/GU negative Renal ROS  negative genitourinary   Musculoskeletal negative musculoskeletal ROS (+)   Abdominal Normal abdominal exam  (+)   Peds  Hematology negative hematology ROS (+)   Anesthesia Other Findings   Reproductive/Obstetrics negative OB ROS                            Anesthesia Physical Anesthesia Plan  ASA: II  Anesthesia Plan: General   Post-op Pain Management:  Regional for Post-op pain   Induction: Intravenous  PONV Risk Score and Plan: 4 or greater and Ondansetron, Dexamethasone, Midazolam, Promethazine and Scopolamine patch - Pre-op  Airway Management Planned: LMA and Oral ETT  Additional Equipment: None  Intra-op Plan:   Post-operative Plan: Extubation in OR  Informed Consent: I have reviewed the patients History and Physical, chart, labs and discussed the procedure including the risks, benefits and alternatives for the proposed anesthesia with the patient or authorized representative who has indicated his/her understanding and acceptance.     Dental advisory given  Plan Discussed with: CRNA  Anesthesia Plan Comments:        Anesthesia Quick Evaluation

## 2019-01-17 ENCOUNTER — Ambulatory Visit (HOSPITAL_BASED_OUTPATIENT_CLINIC_OR_DEPARTMENT_OTHER)
Admission: RE | Admit: 2019-01-17 | Discharge: 2019-01-18 | Disposition: A | Discharge status: Home / Self Care | Payer: BC Managed Care – PPO | Attending: Plastic Surgery | Admitting: Plastic Surgery

## 2019-01-17 ENCOUNTER — Encounter (HOSPITAL_BASED_OUTPATIENT_CLINIC_OR_DEPARTMENT_OTHER)
Admission: RE | Disposition: A | Discharge status: Home / Self Care | Payer: Self-pay | Source: Home / Self Care | Attending: Plastic Surgery

## 2019-01-17 ENCOUNTER — Ambulatory Visit (HOSPITAL_BASED_OUTPATIENT_CLINIC_OR_DEPARTMENT_OTHER): Payer: BC Managed Care – PPO | Admitting: Anesthesiology

## 2019-01-17 ENCOUNTER — Encounter (HOSPITAL_BASED_OUTPATIENT_CLINIC_OR_DEPARTMENT_OTHER): Payer: Self-pay | Admitting: Surgery

## 2019-01-17 ENCOUNTER — Encounter (HOSPITAL_COMMUNITY)
Admission: RE | Admit: 2019-01-17 | Discharge: 2019-01-17 | Disposition: A | Discharge status: Home / Self Care | Payer: BC Managed Care – PPO | Source: Ambulatory Visit | Attending: Surgery | Admitting: Surgery

## 2019-01-17 ENCOUNTER — Other Ambulatory Visit: Payer: Self-pay

## 2019-01-17 DIAGNOSIS — Z79899 Other long term (current) drug therapy: Secondary | ICD-10-CM | POA: Diagnosis not present

## 2019-01-17 DIAGNOSIS — Z793 Long term (current) use of hormonal contraceptives: Secondary | ICD-10-CM | POA: Insufficient documentation

## 2019-01-17 DIAGNOSIS — Z803 Family history of malignant neoplasm of breast: Secondary | ICD-10-CM | POA: Insufficient documentation

## 2019-01-17 DIAGNOSIS — D0512 Intraductal carcinoma in situ of left breast: Secondary | ICD-10-CM

## 2019-01-17 DIAGNOSIS — Z85820 Personal history of malignant melanoma of skin: Secondary | ICD-10-CM | POA: Diagnosis not present

## 2019-01-17 DIAGNOSIS — C50412 Malignant neoplasm of upper-outer quadrant of left female breast: Secondary | ICD-10-CM | POA: Diagnosis not present

## 2019-01-17 DIAGNOSIS — C50912 Malignant neoplasm of unspecified site of left female breast: Secondary | ICD-10-CM | POA: Diagnosis not present

## 2019-01-17 DIAGNOSIS — Z170 Estrogen receptor positive status [ER+]: Secondary | ICD-10-CM | POA: Insufficient documentation

## 2019-01-17 DIAGNOSIS — Z853 Personal history of malignant neoplasm of breast: Secondary | ICD-10-CM | POA: Diagnosis not present

## 2019-01-17 DIAGNOSIS — G8918 Other acute postprocedural pain: Secondary | ICD-10-CM | POA: Diagnosis not present

## 2019-01-17 DIAGNOSIS — N6081 Other benign mammary dysplasias of right breast: Secondary | ICD-10-CM | POA: Diagnosis not present

## 2019-01-17 HISTORY — PX: BREAST RECONSTRUCTION WITH PLACEMENT OF TISSUE EXPANDER AND ALLODERM: SHX6805

## 2019-01-17 HISTORY — PX: NIPPLE SPARING MASTECTOMY WITH SENTINEL LYMPH NODE BIOPSY: SHX6826

## 2019-01-17 HISTORY — DX: Nausea with vomiting, unspecified: R11.2

## 2019-01-17 HISTORY — DX: Malignant neoplasm of unspecified site of unspecified female breast: C50.919

## 2019-01-17 HISTORY — DX: Other specified postprocedural states: Z98.890

## 2019-01-17 SURGERY — NIPPLE SPARING MASTECTOMY WITH SENTINEL LYMPH NODE BIOPSY
Anesthesia: General | Site: Breast | Laterality: Bilateral

## 2019-01-17 MED ORDER — CLONIDINE HCL (ANALGESIA) 100 MCG/ML EP SOLN: EPIDURAL | Status: DC | PRN

## 2019-01-17 MED ORDER — DEXAMETHASONE SODIUM PHOSPHATE 4 MG/ML IJ SOLN
INTRAMUSCULAR | Status: DC | PRN
  Administered 2019-01-17: 10 mg via INTRAVENOUS

## 2019-01-17 MED ORDER — DEXTROSE 5 % IV SOLN
3.0000 g | INTRAVENOUS | Status: AC
  Administered 2019-01-17: 2 g via INTRAVENOUS

## 2019-01-17 MED ORDER — SODIUM CHLORIDE 0.9 % IV SOLN
INTRAVENOUS | Status: DC | PRN
  Administered 2019-01-17: 1000 mL

## 2019-01-17 MED ORDER — FENTANYL CITRATE (PF) 100 MCG/2ML IJ SOLN
INTRAMUSCULAR | Status: AC
  Filled 2019-01-17: qty 2

## 2019-01-17 MED ORDER — ONDANSETRON HCL 4 MG/2ML IJ SOLN
INTRAMUSCULAR | Status: DC | PRN
  Administered 2019-01-17: 4 mg via INTRAVENOUS

## 2019-01-17 MED ORDER — KCL IN DEXTROSE-NACL 20-5-0.45 MEQ/L-%-% IV SOLN
INTRAVENOUS | Status: DC
  Filled 2019-01-17: qty 1000

## 2019-01-17 MED ORDER — GABAPENTIN 300 MG PO CAPS
300.0000 mg | ORAL_CAPSULE | ORAL | Status: AC
  Administered 2019-01-17: 07:00:00 300 mg via ORAL

## 2019-01-17 MED ORDER — ROCURONIUM BROMIDE 100 MG/10ML IV SOLN
INTRAVENOUS | Status: DC | PRN
  Administered 2019-01-17: 60 mg via INTRAVENOUS

## 2019-01-17 MED ORDER — OXYCODONE HCL 5 MG PO TABS
5.0000 mg | ORAL_TABLET | ORAL | Status: DC | PRN
  Administered 2019-01-17: 15:00:00 5 mg via ORAL
  Filled 2019-01-17: qty 1

## 2019-01-17 MED ORDER — CLONIDINE HCL (ANALGESIA) 100 MCG/ML EP SOLN
EPIDURAL | Status: DC | PRN
  Administered 2019-01-17 (×2): 100 ug

## 2019-01-17 MED ORDER — SULFAMETHOXAZOLE-TRIMETHOPRIM 800-160 MG PO TABS: 1.0000 | ORAL_TABLET | Freq: Two times a day (BID) | ORAL | 0 refills | Status: DC

## 2019-01-17 MED ORDER — LACTATED RINGERS IV SOLN: INTRAVENOUS | Status: DC

## 2019-01-17 MED ORDER — BUPIVACAINE-EPINEPHRINE (PF) 0.25% -1:200000 IJ SOLN
INTRAMUSCULAR | Status: DC | PRN
  Administered 2019-01-17 (×12): 5 mL via PERINEURAL

## 2019-01-17 MED ORDER — LIDOCAINE 2% (20 MG/ML) 5 ML SYRINGE
INTRAMUSCULAR | Status: AC
  Filled 2019-01-17: qty 5

## 2019-01-17 MED ORDER — BUPIVACAINE HCL (PF) 0.25 % IJ SOLN
INTRAMUSCULAR | Status: AC
  Filled 2019-01-17: qty 30

## 2019-01-17 MED ORDER — ONDANSETRON HCL 4 MG/2ML IJ SOLN
INTRAMUSCULAR | Status: AC
  Filled 2019-01-17: qty 2

## 2019-01-17 MED ORDER — SCOPOLAMINE 1 MG/3DAYS TD PT72
MEDICATED_PATCH | TRANSDERMAL | Status: DC | PRN
  Administered 2019-01-17: 1 via TRANSDERMAL

## 2019-01-17 MED ORDER — ESCITALOPRAM OXALATE 5 MG PO TABS
5.0000 mg | ORAL_TABLET | Freq: Every day | ORAL | Status: DC
  Administered 2019-01-17: 05:00:00 5 mg via ORAL

## 2019-01-17 MED ORDER — PROPOFOL 10 MG/ML IV BOLUS
INTRAVENOUS | Status: DC | PRN
  Administered 2019-01-17: 200 mg via INTRAVENOUS

## 2019-01-17 MED ORDER — MIDAZOLAM HCL 2 MG/2ML IJ SOLN
INTRAMUSCULAR | Status: AC
  Filled 2019-01-17: qty 2

## 2019-01-17 MED ORDER — LIDOCAINE HCL (CARDIAC) PF 100 MG/5ML IV SOSY
PREFILLED_SYRINGE | INTRAVENOUS | Status: DC | PRN
  Administered 2019-01-17: 50 mg via INTRAVENOUS

## 2019-01-17 MED ORDER — KETOROLAC TROMETHAMINE 30 MG/ML IJ SOLN
30.0000 mg | Freq: Once | INTRAMUSCULAR | Status: AC | PRN
  Administered 2019-01-17: 13:00:00 30 mg via INTRAVENOUS

## 2019-01-17 MED ORDER — HYDROMORPHONE HCL 1 MG/ML IJ SOLN
0.2500 mg | INTRAMUSCULAR | Status: DC | PRN
  Administered 2019-01-17: 13:00:00 0.25 mg via INTRAVENOUS

## 2019-01-17 MED ORDER — SODIUM CHLORIDE (PF) 0.9 % IJ SOLN
INTRAMUSCULAR | Status: AC
  Filled 2019-01-17: qty 10

## 2019-01-17 MED ORDER — OXYCODONE HCL 5 MG PO TABS: 5.0000 mg | ORAL_TABLET | ORAL | 0 refills | Status: DC | PRN

## 2019-01-17 MED ORDER — METHOCARBAMOL 500 MG PO TABS: 500.0000 mg | ORAL_TABLET | Freq: Three times a day (TID) | ORAL | 0 refills | Status: DC | PRN

## 2019-01-17 MED ORDER — TECHNETIUM TC 99M SULFUR COLLOID FILTERED
1.0000 | Freq: Once | INTRAVENOUS | Status: AC | PRN
  Administered 2019-01-17: 1 via INTRADERMAL

## 2019-01-17 MED ORDER — METHYLENE BLUE 0.5 % INJ SOLN
INTRAVENOUS | Status: AC
  Filled 2019-01-17: qty 10

## 2019-01-17 MED ORDER — PHENYLEPHRINE HCL-NACL 10-0.9 MG/250ML-% IV SOLN
INTRAVENOUS | Status: DC | PRN
  Administered 2019-01-17: 50 ug/min via INTRAVENOUS

## 2019-01-17 MED ORDER — SCOPOLAMINE 1 MG/3DAYS TD PT72
MEDICATED_PATCH | TRANSDERMAL | Status: AC
  Filled 2019-01-17: qty 1

## 2019-01-17 MED ORDER — CHLORHEXIDINE GLUCONATE CLOTH 2 % EX PADS: 6.0000 | MEDICATED_PAD | Freq: Once | CUTANEOUS | Status: DC

## 2019-01-17 MED ORDER — MIDAZOLAM HCL 2 MG/2ML IJ SOLN
1.0000 mg | INTRAMUSCULAR | Status: DC | PRN
  Administered 2019-01-17: 08:00:00 4 mg via INTRAVENOUS

## 2019-01-17 MED ORDER — HYDROMORPHONE HCL 1 MG/ML IJ SOLN
INTRAMUSCULAR | Status: AC
  Filled 2019-01-17: qty 0.5

## 2019-01-17 MED ORDER — HYDROMORPHONE HCL 1 MG/ML IJ SOLN: 0.5000 mg | INTRAMUSCULAR | Status: DC | PRN

## 2019-01-17 MED ORDER — KETOROLAC TROMETHAMINE 30 MG/ML IJ SOLN
INTRAMUSCULAR | Status: AC
  Filled 2019-01-17: qty 1

## 2019-01-17 MED ORDER — CEFAZOLIN SODIUM-DEXTROSE 2-4 GM/100ML-% IV SOLN
INTRAVENOUS | Status: AC
  Filled 2019-01-17: qty 100

## 2019-01-17 MED ORDER — KETOROLAC TROMETHAMINE 30 MG/ML IJ SOLN
30.0000 mg | Freq: Three times a day (TID) | INTRAMUSCULAR | Status: DC
  Administered 2019-01-17 – 2019-01-18 (×2): 30 mg via INTRAVENOUS
  Filled 2019-01-17 (×2): qty 1

## 2019-01-17 MED ORDER — ONDANSETRON 4 MG PO TBDP: 4.0000 mg | ORAL_TABLET | Freq: Four times a day (QID) | ORAL | Status: DC | PRN

## 2019-01-17 MED ORDER — METHOCARBAMOL 500 MG PO TABS
500.0000 mg | ORAL_TABLET | Freq: Three times a day (TID) | ORAL | Status: DC | PRN
  Administered 2019-01-17 – 2019-01-18 (×3): 500 mg via ORAL
  Filled 2019-01-17 (×3): qty 1

## 2019-01-17 MED ORDER — CEFAZOLIN SODIUM-DEXTROSE 1-4 GM/50ML-% IV SOLN
1.0000 g | Freq: Three times a day (TID) | INTRAVENOUS | Status: AC
  Administered 2019-01-17 – 2019-01-18 (×3): 1 g via INTRAVENOUS
  Filled 2019-01-17 (×4): qty 50

## 2019-01-17 MED ORDER — PROMETHAZINE HCL 25 MG/ML IJ SOLN: 6.2500 mg | INTRAMUSCULAR | Status: DC | PRN

## 2019-01-17 MED ORDER — MECLIZINE HCL 12.5 MG PO TABS: 12.5000 mg | ORAL_TABLET | Freq: Three times a day (TID) | ORAL | Status: DC | PRN

## 2019-01-17 MED ORDER — POVIDONE-IODINE 10 % EX SOLN
CUTANEOUS | Status: DC | PRN
  Administered 2019-01-17: 1 via TOPICAL

## 2019-01-17 MED ORDER — PHENYLEPHRINE HCL (PRESSORS) 10 MG/ML IV SOLN
INTRAVENOUS | Status: AC
  Filled 2019-01-17: qty 1

## 2019-01-17 MED ORDER — MEPERIDINE HCL 25 MG/ML IJ SOLN: 6.2500 mg | INTRAMUSCULAR | Status: DC | PRN

## 2019-01-17 MED ORDER — ACETAMINOPHEN 500 MG PO TABS
ORAL_TABLET | ORAL | Status: AC
  Filled 2019-01-17: qty 2

## 2019-01-17 MED ORDER — ENOXAPARIN SODIUM 40 MG/0.4ML ~~LOC~~ SOLN: 40.0000 mg | SUBCUTANEOUS | Status: DC

## 2019-01-17 MED ORDER — GABAPENTIN 300 MG PO CAPS
ORAL_CAPSULE | ORAL | Status: AC
  Filled 2019-01-17: qty 1

## 2019-01-17 MED ORDER — SUGAMMADEX SODIUM 200 MG/2ML IV SOLN
INTRAVENOUS | Status: DC | PRN
  Administered 2019-01-17: 200 mg via INTRAVENOUS

## 2019-01-17 MED ORDER — ACETAMINOPHEN 500 MG PO TABS
1000.0000 mg | ORAL_TABLET | ORAL | Status: AC
  Administered 2019-01-17: 07:00:00 1000 mg via ORAL

## 2019-01-17 MED ORDER — DEXAMETHASONE SODIUM PHOSPHATE 10 MG/ML IJ SOLN
INTRAMUSCULAR | Status: AC
  Filled 2019-01-17: qty 1

## 2019-01-17 MED ORDER — ONDANSETRON HCL 4 MG/2ML IJ SOLN: 4.0000 mg | Freq: Four times a day (QID) | INTRAMUSCULAR | Status: DC | PRN

## 2019-01-17 MED ORDER — FENTANYL CITRATE (PF) 100 MCG/2ML IJ SOLN
50.0000 ug | INTRAMUSCULAR | Status: AC | PRN
  Administered 2019-01-17 (×2): 50 ug via INTRAVENOUS
  Administered 2019-01-17: 08:00:00 200 ug via INTRAVENOUS
  Administered 2019-01-17: 25 ug via INTRAVENOUS

## 2019-01-17 MED ORDER — PROPOFOL 500 MG/50ML IV EMUL
INTRAVENOUS | Status: AC
  Filled 2019-01-17: qty 50

## 2019-01-17 SURGICAL SUPPLY — 82 items
ALLOGRAFT PERF 16X20 1.6+/-0.4 (Tissue) ×2 IMPLANT
APPLIER CLIP 9.375 MED OPEN (MISCELLANEOUS) ×2
BAG DECANTER FOR FLEXI CONT (MISCELLANEOUS) ×2 IMPLANT
BENZOIN TINCTURE PRP APPL 2/3 (GAUZE/BANDAGES/DRESSINGS) IMPLANT
BINDER BREAST LRG (GAUZE/BANDAGES/DRESSINGS) ×1 IMPLANT
BINDER BREAST MEDIUM (GAUZE/BANDAGES/DRESSINGS) IMPLANT
BINDER BREAST XLRG (GAUZE/BANDAGES/DRESSINGS) IMPLANT
BLADE SURG 10 STRL SS (BLADE) ×3 IMPLANT
BLADE SURG 15 STRL LF DISP TIS (BLADE) ×1 IMPLANT
BLADE SURG 15 STRL SS (BLADE) ×1
BNDG GAUZE ELAST 4 BULKY (GAUZE/BANDAGES/DRESSINGS) ×2 IMPLANT
CANISTER SUCT 1200ML W/VALVE (MISCELLANEOUS) ×3 IMPLANT
CATH ROBINSON RED A/P 12FR (CATHETERS) ×1 IMPLANT
CHLORAPREP W/TINT 26 (MISCELLANEOUS) ×3 IMPLANT
CLIP APPLIE 9.375 MED OPEN (MISCELLANEOUS) ×1 IMPLANT
COVER BACK TABLE REUSABLE LG (DRAPES) ×2 IMPLANT
COVER MAYO STAND REUSABLE (DRAPES) ×3 IMPLANT
COVER PROBE W GEL 5X96 (DRAPES) ×2 IMPLANT
DERMABOND ADVANCED (GAUZE/BANDAGES/DRESSINGS) ×2
DERMABOND ADVANCED .7 DNX12 (GAUZE/BANDAGES/DRESSINGS) ×2 IMPLANT
DRAIN CHANNEL 15F RND FF W/TCR (WOUND CARE) ×2 IMPLANT
DRAIN CHANNEL 19F RND (DRAIN) ×3 IMPLANT
DRAPE HALF SHEET 70X43 (DRAPES) ×3 IMPLANT
DRAPE TOP ARMCOVERS (MISCELLANEOUS) ×2 IMPLANT
DRAPE U-SHAPE 76X120 STRL (DRAPES) ×2 IMPLANT
DRAPE UTILITY XL STRL (DRAPES) ×3 IMPLANT
DRSG PAD ABDOMINAL 8X10 ST (GAUZE/BANDAGES/DRESSINGS) ×5 IMPLANT
DRSG TEGADERM 2-3/8X2-3/4 SM (GAUZE/BANDAGES/DRESSINGS) IMPLANT
DRSG TEGADERM 4X10 (GAUZE/BANDAGES/DRESSINGS) ×6 IMPLANT
ELECT BLADE 4.0 EZ CLEAN MEGAD (MISCELLANEOUS) ×2
ELECT BLADE 6.5 EXT (BLADE) ×1 IMPLANT
ELECT REM PT RETURN 9FT ADLT (ELECTROSURGICAL) ×2
ELECTRODE BLDE 4.0 EZ CLN MEGD (MISCELLANEOUS) ×1 IMPLANT
ELECTRODE REM PT RTRN 9FT ADLT (ELECTROSURGICAL) ×1 IMPLANT
EVACUATOR SILICONE 100CC (DRAIN) ×5 IMPLANT
EXPANDER TISSUE FORTE 300CC (Breast) IMPLANT
GAUZE SPONGE 4X4 12PLY STRL LF (GAUZE/BANDAGES/DRESSINGS) IMPLANT
GLOVE BIO SURGEON STRL SZ 6 (GLOVE) ×8 IMPLANT
GLOVE BIO SURGEON STRL SZ7 (GLOVE) ×6 IMPLANT
GLOVE BIOGEL PI IND STRL 7.5 (GLOVE) IMPLANT
GLOVE BIOGEL PI IND STRL 8 (GLOVE) ×1 IMPLANT
GLOVE BIOGEL PI INDICATOR 7.5 (GLOVE) ×2
GLOVE BIOGEL PI INDICATOR 8 (GLOVE) ×1
GLOVE ECLIPSE 8.0 STRL XLNG CF (GLOVE) ×2 IMPLANT
GOWN STRL REUS W/ TWL LRG LVL3 (GOWN DISPOSABLE) ×2 IMPLANT
GOWN STRL REUS W/TWL LRG LVL3 (GOWN DISPOSABLE) ×3
LIGHT WAVEGUIDE WIDE FLAT (MISCELLANEOUS) ×1 IMPLANT
MARKER SKIN DUAL TIP RULER LAB (MISCELLANEOUS) IMPLANT
NDL HYPO 25X1 1.5 SAFETY (NEEDLE) ×2 IMPLANT
NEEDLE HYPO 25X1 1.5 SAFETY (NEEDLE) ×2 IMPLANT
NS IRRIG 1000ML POUR BTL (IV SOLUTION) ×1 IMPLANT
PACK BASIN DAY SURGERY FS (CUSTOM PROCEDURE TRAY) ×2 IMPLANT
PENCIL SMOKE EVACUATOR (MISCELLANEOUS) ×2 IMPLANT
PIN SAFETY STERILE (MISCELLANEOUS) ×2 IMPLANT
SLEEVE SCD COMPRESS KNEE MED (MISCELLANEOUS) ×2 IMPLANT
SPONGE LAP 18X18 RF (DISPOSABLE) ×5 IMPLANT
STAPLER VISISTAT 35W (STAPLE) ×2 IMPLANT
STRIP CLOSURE SKIN 1/2X4 (GAUZE/BANDAGES/DRESSINGS) IMPLANT
SUT CHROMIC 4 0 PS 2 18 (SUTURE) ×5 IMPLANT
SUT ETHILON 2 0 FS 18 (SUTURE) ×3 IMPLANT
SUT MNCRL AB 4-0 PS2 18 (SUTURE) ×4 IMPLANT
SUT PDS AB 2-0 CT2 27 (SUTURE) IMPLANT
SUT SILK 2 0 SH (SUTURE) ×1 IMPLANT
SUT VIC AB 3-0 54X BRD REEL (SUTURE) ×1 IMPLANT
SUT VIC AB 3-0 BRD 54 (SUTURE)
SUT VIC AB 3-0 SH 27 (SUTURE) ×2
SUT VIC AB 3-0 SH 27X BRD (SUTURE) IMPLANT
SUT VICRYL 0 CT-2 (SUTURE) ×4 IMPLANT
SUT VICRYL 3-0 CR8 SH (SUTURE) ×3 IMPLANT
SUT VICRYL 4-0 PS2 18IN ABS (SUTURE) ×2 IMPLANT
SUT VLOC 180 0 24IN GS25 (SUTURE) ×2 IMPLANT
SYR 10ML LL (SYRINGE) ×2 IMPLANT
SYR 50ML LL SCALE MARK (SYRINGE) ×2 IMPLANT
SYR BULB IRRIGATION 50ML (SYRINGE) ×3 IMPLANT
SYR CONTROL 10ML LL (SYRINGE) ×3 IMPLANT
TAPE MEASURE VINYL STERILE (MISCELLANEOUS) IMPLANT
TISSUE EXPANDER FORTE 300CC (Breast) ×4 IMPLANT
TOWEL GREEN STERILE FF (TOWEL DISPOSABLE) ×4 IMPLANT
TRAY DSU PREP LF (CUSTOM PROCEDURE TRAY) ×2 IMPLANT
TUBE CONNECTING 20X1/4 (TUBING) ×2 IMPLANT
UNDERPAD 30X36 HEAVY ABSORB (UNDERPADS AND DIAPERS) ×4 IMPLANT
YANKAUER SUCT BULB TIP NO VENT (SUCTIONS) ×2 IMPLANT

## 2019-01-17 NOTE — Anesthesia Postprocedure Evaluation (Signed)
Anesthesia Post Note  Patient: Stephanie [name]  Procedure(s) Performed: BILATERAL NIPPLE SPARING MASTECTOMIES WITH LEFT SENTINEL LYMPH NODE MAPPING (Bilateral Breast) BILATERAL BREAST RECONSTRUCTION WITH PLACEMENT OF TISSUE EXPANDER AND ALLODERM (Bilateral Breast)     Patient location during evaluation: PACU Anesthesia Type: General Level of consciousness: sedated and awake Pain management: pain level controlled Vital Signs Assessment: post-procedure vital signs reviewed and stable Respiratory status: spontaneous breathing Cardiovascular status: stable Postop Assessment: no apparent nausea or vomiting Anesthetic complications: no    Last Vitals:  Vitals:   01/17/19 1230 01/17/19 1245  BP: (!) 97/53 (!) 97/54  Pulse: 78 86  Resp: 16 17  Temp:    SpO2: 99% 99%    Last Pain:  Vitals:   01/17/19 1245  TempSrc:   PainSc: 4    Pain Goal: Patients Stated Pain Goal: 4 (01/17/19 1245)                 Huston Foley

## 2019-01-17 NOTE — Anesthesia Procedure Notes (Signed)
Anesthesia Regional Block: Pectoralis block   Pre-Anesthetic Checklist: ,, timeout performed, Correct Patient, Correct Site, Correct Laterality, Correct Procedure, Correct Position, site marked, Risks and benefits discussed,  Surgical consent,  Pre-op evaluation,  At surgeon's request and post-op pain management  Laterality: Right and N/A  Prep: chloraprep       Needles:  Injection technique: Single-shot  Needle Type: Echogenic Stimulator Needle     Needle Length: 10cm  Needle Gauge: 21   Needle insertion depth: 3.5 cm   Additional Needles:   Procedures:,,,, ultrasound used (permanent image in chart),,,,  Narrative:  Start time: 01/17/2019 7:15 AM End time: 01/17/2019 7:30 AM Injection made incrementally with aspirations every 5 mL.  Performed by: Personally  Anesthesiologist: Lyn Hollingshead, MD

## 2019-01-17 NOTE — Progress Notes (Signed)
Assisted Dr. Jillyn Hidden with right, left, pectoralis block. Side rails up, monitors on throughout procedure. See vital signs in flow sheet. Tolerated Procedure well.

## 2019-01-17 NOTE — H&P (Signed)
Stephanie [name] Documented: 11/11/2018 12:01 PM Location: [facility] Patient #: [id number] DOB: Aug 20, 1985 Married / Language: English / Race: White Female  History of Present Illness Stephanie Moores A. 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 Magnetic resonance imaging showed the area to be about 2-1/2 cm in maximal diameter. There is no other abnormality noted. She has been thinking extensively about bilateral nipple preserving mastectomy with reconstruction. She still not decided that she is leaning that way. We had extensive discussion of the pros and cons of this to include nipple loss, flap loss, and long-term expectations about being insensate and other potential complications. We discussed breast conserving treatment as well in the long-term implications of tamoxifen use and she is chemoprevention and radiation therapy. She is still undecided but is leaning toward bilateral mastectomy with the right being prophylactic given her elevated risk of breast cancer being well over 25-30% if not higher given her young age first-degree relative with breast cancer. Her genetics are not yet back.  The patient is a 34 year old female.   Allergies (Stephanie [name], Stephanie [name]; 11/11/2018 12:01 PM) No Known Allergies [10/31/2018]: Allergies Reconciled  Medication History (Stephanie [name], [place]; 11/11/2018 12:01 PM) Slynd (4MG  Tablet, Oral) Active. Multiple Vitamins/Iron (1 (one) Oral) Active. Antivert (12.5MG  Tablet, Oral) Active. Medications Reconciled    Vitals (Stephanie [name] RMA; 11/11/2018 12:01 PM) 11/11/2018 12:01 PM Weight: 149.4 lb Height: 66in Body Surface Area: 1.77 m Body Mass Index: 24.11 kg/m  Temp.: 97.53F  Pulse: 93 (Regular)  BP: 128/84 (Sitting, Left Arm, Standard)        Physical Exam (Stephanie Orona A. Crystol Walpole MD; 11/11/2018 12:42 PM)  General Note: Atraumatic.  Pleasant  Breast Note: Not reexamined today  Neurologic Neurologic evaluation reveals -alert and oriented x 3 with no impairment of recent or remote memory. Mental Status-Normal.    Assessment & Plan (Stephanie Schue A. Khani Paino MD; 11/11/2018 12:43 PM)  BREAST NEOPLASM, TIS (DCIS), LEFT (D05.12) Impression: Spent 30 minutes discussing treatment options in counseling today. Pt opted for  bilateral nipple preserving mastectomy. With a long conversation about this and compared this to breast conserving treatment options as well. She will let us know her where she wants to do but is leaning toward bilateral nipple preserving mastectomy with the right being prophylactic to reduce her lifetime risk of up to 40% of developing breast cancer. Discussed treatment options for breast cancer to include breast conservation vs mastectomy with reconstruction. Pt has decided on mastectomy. Risk include bleeding, nipple loss/ loss of sensation  infection, flap necrosis, pain, numbness, recurrence, hematoma, other surgery needs. Pt understands and agrees to proceed. Risk of sentinel lymph node mapping include bleeding, infection, lymphedema, shoulder pain. stiffness, dye allergy. cosmetic deformity , blood clots, death, need for more surgery. Pt agres to proceed.  Current Plans Pt Education - [facility] Mastectomy HCI Pt Education - ABC (After Breast Cancer) Class Info: discussed with patient and provided information. Pt Education - [facility] Breast Cancer Information Given - [facility] "Breast Journey" Package  FAMILY HISTORY OF BREAST CANCER IN FIRST DEGREE RELATIVE (Z80.3)

## 2019-01-17 NOTE — Transfer of Care (Signed)
Immediate Anesthesia Transfer of Care Note  Patient: Stephanie [name]  Procedure(s) Performed: BILATERAL NIPPLE SPARING MASTECTOMIES WITH LEFT SENTINEL LYMPH NODE MAPPING (Bilateral Breast) BILATERAL BREAST RECONSTRUCTION WITH PLACEMENT OF TISSUE EXPANDER AND ALLODERM (Bilateral Breast)  Patient Location: PACU  Anesthesia Type:GA combined with regional for post-op pain  Level of Consciousness: drowsy and patient cooperative  Airway & Oxygen Therapy: Patient Spontanous Breathing and Patient connected to face mask oxygen  Post-op Assessment: Report given to RN and Post -op Vital signs reviewed and stable  Post vital signs: Reviewed and stable  Last Vitals:  Vitals Value Taken Time  BP    Temp    Pulse 69 01/17/19 1146  Resp 20 01/17/19 1145  SpO2 100 % 01/17/19 1146  Vitals shown include unvalidated device data.  Last Pain:  Vitals:   01/17/19 0643  TempSrc: Oral  PainSc: 0-No pain         Complications: No apparent anesthesia complications

## 2019-01-17 NOTE — Interval H&P Note (Signed)
History and Physical Interval Note:  01/17/2019 7:17 AM  Stephanie [name]  has presented today for surgery, with the diagnosis of LEFT DCIS, FAMILY HISTORY BREAST CANCER.  The various methods of treatment have been discussed with the patient and family. After consideration of risks, benefits and other options for treatment, the patient has consented to  Procedure(s): BILATERAL NIPPLE SPARING MASTECTOMIES WITH LEFT SENTINEL LYMPH NODE MAPPING (Bilateral) BREAST RECONSTRUCTION WITH PLACEMENT OF TISSUE EXPANDER AND ALLODERM (Bilateral) as a surgical intervention.  The patient's history has been reviewed, patient examined, no change in status, stable for surgery.  I have reviewed the patient's chart and labs.  Questions were answered to the patient's satisfaction.     [place]

## 2019-01-17 NOTE — OR Nursing (Signed)
In and Out cath performed using sterile technique prior to extubation. 258ml clear yellow urine collected.

## 2019-01-17 NOTE — Anesthesia Procedure Notes (Signed)
Anesthesia Regional Block: Pectoralis block   Pre-Anesthetic Checklist: ,, timeout performed, Correct Patient, Correct Site, Correct Laterality, Correct Procedure, Correct Position, site marked, Risks and benefits discussed,  Surgical consent,  Pre-op evaluation,  At surgeon's request and post-op pain management  Laterality: Left and N/A  Prep: chloraprep       Needles:  Injection technique: Single-shot  Needle Type: Echogenic Stimulator Needle     Needle Length: 10cm  Needle Gauge: 21   Needle insertion depth: 3 cm   Additional Needles:   Procedures:,,,, ultrasound used (permanent image in chart),,,,  Narrative:  Start time: 01/17/2019 7:00 AM End time: 01/17/2019 7:10 AM Injection made incrementally with aspirations every 5 mL.  Performed by: Personally  Anesthesiologist: Lyn Hollingshead, MD

## 2019-01-17 NOTE — Interval H&P Note (Signed)
History and Physical Interval Note:  01/17/2019 7:03 AM  Stephanie [name]  has presented today for surgery, with the diagnosis of LEFT DCIS, FAMILY HISTORY BREAST CANCER.  The various methods of treatment have been discussed with the patient and family. After consideration of risks, benefits and other options for treatment, the patient has consented to  Procedure(s): BILATERAL NIPPLE SPARING MASTECTOMIES WITH LEFT SENTINEL LYMPH NODE MAPPING (Bilateral) BREAST RECONSTRUCTION WITH PLACEMENT OF TISSUE EXPANDER AND ALLODERM (Bilateral) as a surgical intervention.  The patient's history has been reviewed, patient examined, no change in status, stable for surgery.  I have reviewed the patient's chart and labs.  Questions were answered to the patient's satisfaction.     Arnoldo Hooker Najmo Pardue

## 2019-01-17 NOTE — Progress Notes (Signed)
Nuc med injections completed. Patient tolerated well.   

## 2019-01-17 NOTE — Op Note (Signed)
Preoperative diagnosis: Left breast DCIS with high risk of developing breast cancer over her lifetime over 20%  Postoperative diagnosis: Same  Procedure: Bilateral nipple sparing mastectomy with left axillary sentinel lymph node mapping  Surgeon: Erroll Luna, MD  Assistant:Dr Thimmappa MD     Anesthesia: General with bilateral pectoral block  EBL: Minimal  Specimen: Bilateral breast tissue oriented to pathology and bilateral nipple biopsy frozen section sent to pathology and 1 left axillary sentinel node  Drains: Per plastic surgery  Indications for procedure: The patient is a 34 year old female found to have left breast DCIS.  She opted for bilateral mastectomy with nipple preservation due to high risk of developing breast cancer over her lifetime of over 20% and left breast DCIS.  She opted for bilateral reconstructions and has been seen by plastic surgery.The surgical and non surgical options have been discussed with the patient.  Risks of surgery include bleeding,  Infection,  Flap necrosis,  Tissue loss,  Chronic pain, death, Numbness,  And the need for additional procedures.  Reconstruction options also have been discussed with the patient as well.  The patient agrees to proceed.   Description of procedure: The patient was met in the holding area.  She underwent bilateral pectoral block and injection of technetium sulfur colloid into her left breast.  Questions were answered.  She was then taken back to the operating.  She is placed supine upon the OR table.  After induction of general anesthesia, both breasts were prepped and draped in a sterile fashion and timeout was performed.  The left side was performed first.  A small incision was made left axilla using help the neoprobe.  Hotspot identified in a single hot sentinel node identified as a level 1 axillary lymph node and this was removed.  Background counts approached 0.  This was closed with 3-0 Vicryl and 4-0 Monocryl.  An  inframammary incision was then made.  History is done in the inframammary fold.  Dissection was carried down until the pectoralis major fascia was encountered.  I then dissected posterior to the breast tissue to elevate the breast off the pectoralis major muscle up to the clavicle, to the lateral attachments and then to the midline.  We then mobilized the skin off the anterior portion of the breast using cautery and sharp dissection.  A nipple biopsy was removed the backside nipple and sent off.  We then continued our dissection along the superior part of the flap using cautery and sharp dissection with care taken not to injure the skin.  Once all breast tissue was mobilized it was removed from the superior attachments and removed.  It was oriented with ink and sent to pathology.  A dry dressing was placed and hemostasis was achieved.  Next, the right side was done.  In a similar fashion inframammary fold incision was made.  Dissection was carried down to the fascia of the pectoralis major muscles identified.  The posterior part of the breast was dissected off that up to the clavicle, to the lateral attachments and then medial to the sternum.  The skin was dissected in a similar fashion off the anterior portion of the breast with care taken not to injure the skin.  A right nipple biopsy was performed in a similar fashion and passed off the field.  This was mobilized completely from the undersurface of the skin.  The breast was then amputated from the superior attachments.  It was oriented with ink and passed off the field.  Hemostasis was achieved with cautery.  All counts were correct.  At this portion of the case, plastic surgery took over for reconstruction.  Please see their note for details.  EBL at this point was under 20 cc.  All counts were correct.

## 2019-01-17 NOTE — Anesthesia Procedure Notes (Signed)
Procedure Name: Intubation Performed by: Terrance Mass, CRNA Pre-anesthesia Checklist: Patient identified, Emergency Drugs available, Suction available and Patient being monitored Patient Re-evaluated:Patient Re-evaluated prior to induction Oxygen Delivery Method: Circle system utilized Preoxygenation: Pre-oxygenation with 100% oxygen Induction Type: IV induction Ventilation: Mask ventilation without difficulty Laryngoscope Size: Miller and 2 Tube type: Oral Tube size: 7.0 mm Number of attempts: 1 Airway Equipment and Method: Stylet Placement Confirmation: ETT inserted through vocal cords under direct vision,  positive ETCO2 and breath sounds checked- equal and bilateral Secured at: 22 cm Tube secured with: Tape Dental Injury: Teeth and Oropharynx as per pre-operative assessment

## 2019-01-17 NOTE — Op Note (Signed)
Operative Note   DATE OF OPERATION: 1.5.21  LOCATION: [facility]-outpatient  SURGICAL DIVISION: Plastic Surgery  PREOPERATIVE DIAGNOSES:  1. DCIS left breast  POSTOPERATIVE DIAGNOSES:  same  PROCEDURE:  1. Bilateral breast reconstruction with tissue expanders 2. Acellular dermis (Alloderm) for breast reconstruction 600 cm2  SURGEON: Irene Limbo MD MBA  ASSISTANT: none  ANESTHESIA:  General.   EBL: 50 ml for entire procedure  COMPLICATIONS: None immediate.   INDICATIONS FOR PROCEDURE:  The patient, Stephanie [name], is a 34 y.o. female born on 1985-05-24, is here for bilateral immediate prepectoral expander based reconstruction following bilateral nipple sparing mastectomies.   FINDINGS: Natrelle 133S FV-11 -T 300 ml tissue expanders placed bilateral, initial fill volume 220 ml air bilateral. RIGHT SN [id number] LEFT SN [id number]  DESCRIPTION OF PROCEDURE:  The patient was marked with the patient in the preoperative area to mark sternal notch, chest midline, anterior axillary lines and inframammary folds.The patient's operative site was prepped and draped in a sterile fashion. A time out was performed and all information was confirmed to be correct. I assisted in mastectomies with exposure and retraction. Following completion of mastectomies, reconstruction began onleftside.  The cavity was irrigated with saline followed by saline solution containing Ancef, gentamicin, and bacitracin. Hemostasis was ensured.A 19 Fr drain was placed in subcutaneous position laterally and a 15 Fr drain placed along inframammary fold. Each secured to skin with 2-0 nylon. Cavity irrigated with Betadinesaline solution. The tissue expanders were prepared on back table prior in insertion. The expander was filled with air.Perforated acellular dermiswasdraped over anterior surface expander. The ADM was then secured to itself over posterior surface of expanderwith 4-0 chromic. Redundant folds  acellular dermis excised so that the ADM layflat without folds over air filled expander.The expander was secured tofascia over lateral sternal borderwith a 0 vicryl. Thelateral tab wasalso secured to pectoralis muscle with 0-vicryl. The ADM was secured to pectoralis muscle and chest wall along inferior border at inframammary foldwith 0 V-lock suture.Laterally the mastectomy flap over posterior axillary line was advanced anteriorly and the subcutaneous tissue and superficial fascia was secured to pectoralisand serratusmuscleswith 0-vicryl. Skin closure completedwith 3-0 vicryl in fascial layer and 4-0 vicryl in dermis. Skin closure completed with 4-0 monocryl subcuticular and tissue adhesive.  I then directed my attention to rightchest where similar irrigation and drain placement completed. The prepared expander with ADM secured over anterior surface was placed inleftchest and tabs secured to chest wall and pectoralis muscle with 0- vicryl suture. The acellular dermis at inframammary fold was secured to chest wall with 0 V-lock suture.Laterally the mastectomy flap over posterior axillary line was advanced anteriorly and the subcutaneous tissue and superficial fascia was secured to pectoralisand serratusmuscleswith 0-vicryl. Skin closure completedwith 3-0 vicryl in fascial layer and 4-0 vicryl in dermis. Skin closure completed with 4-0 monocryl subcuticular and tissue adhesive.Patient brought to sitting position.The mastectomy flaps were redrapedso that NAC was symmetric from sternal notch and chest midline. Patient returned to supine position.Tegaderm dressings applied followed bydry dressing,breast binder.  The patient was allowed to wake from anesthesia, extubated and taken to the recovery room in satisfactory condition.   SPECIMENS: none  DRAINS: 15 and 19 Fr JP in right and left breast reconstruction  Irene Limbo, MD [facility] Plastic & Reconstructive Surgery

## 2019-01-18 ENCOUNTER — Encounter: Payer: Self-pay | Admitting: *Deleted

## 2019-01-18 DIAGNOSIS — Z793 Long term (current) use of hormonal contraceptives: Secondary | ICD-10-CM | POA: Diagnosis not present

## 2019-01-18 DIAGNOSIS — Z170 Estrogen receptor positive status [ER+]: Secondary | ICD-10-CM | POA: Diagnosis not present

## 2019-01-18 DIAGNOSIS — Z803 Family history of malignant neoplasm of breast: Secondary | ICD-10-CM | POA: Diagnosis not present

## 2019-01-18 DIAGNOSIS — C50412 Malignant neoplasm of upper-outer quadrant of left female breast: Secondary | ICD-10-CM | POA: Diagnosis not present

## 2019-01-18 DIAGNOSIS — Z85820 Personal history of malignant melanoma of skin: Secondary | ICD-10-CM | POA: Diagnosis not present

## 2019-01-18 DIAGNOSIS — Z79899 Other long term (current) drug therapy: Secondary | ICD-10-CM | POA: Diagnosis not present

## 2019-01-18 DIAGNOSIS — C50912 Malignant neoplasm of unspecified site of left female breast: Secondary | ICD-10-CM | POA: Diagnosis not present

## 2019-01-18 DIAGNOSIS — D0512 Intraductal carcinoma in situ of left breast: Secondary | ICD-10-CM | POA: Diagnosis not present

## 2019-01-18 MED ORDER — TRAMADOL HCL 50 MG PO TABS: 50.0000 mg | ORAL_TABLET | Freq: Four times a day (QID) | ORAL | 0 refills | Status: DC | PRN

## 2019-01-18 NOTE — Discharge Instructions (Signed)

## 2019-01-18 NOTE — Discharge Summary (Signed)
Physician Discharge Summary  Patient ID: Stephanie [name] MRN: [id number] DOB/AGE: 36-Apr-1987 36 y.o.  Admit date: 01/17/2019 Discharge date: 01/18/2019  Admission Diagnoses: DCIS left breast  Discharge Diagnoses:  Active Problems:   Ductal carcinoma in situ (DCIS) of left breast   Discharged Condition: stable  Hospital Course: Post operatively patient did well and tolerated diet, ambulation with minimal assist. Pain controlled though oxycodone not effective and plan to try Tramadol for home use.  Treatments: surgery: bilateral nipple sparing mastectomies left SLN bilateral breast reconstruction with tissue expanders acellular dermis 1.5.21  Discharge Exam: Blood pressure (!) 89/52, pulse 68, temperature 98.6 F (37 C), resp. rate 18, height 5\' 6"  (1.676 m), weight 66.3 kg, last menstrual period 01/03/2019, SpO2 100 %. Incision/Wound: chest soft incisions dry intact Tegaderms in place drains serosanguinous  Disposition: Discharge disposition: 01-Home or Self Care       Discharge Instructions    Call MD for:  redness, tenderness, or signs of infection (pain, swelling, bleeding, redness, odor or green/yellow discharge around incision site)   Complete by: As directed    Call MD for:  temperature >100.5   Complete by: As directed    Discharge instructions   Complete by: As directed    Ok to remove dressings and shower am 1.7.20. Soap and water ok, pat Tegaderms dry. Do not remove Tegaderms. No creams or ointments over incisions. Do not let drains dangle in shower, attach to lanyard or similar.Strip and record drains twice daily and bring log to clinic visit.  Breast binder or soft compression bra all other times.  Ok to raise arms above shoulders for bathing and dressing.  No house yard work or exercise until cleared by MD.   Recommend ibuprofen with meals as directed to aid with pain control. Ice to chest for comfort only. Recommend Miralax or Dulcolax as needed for constipation.    Driving Restrictions   Complete by: As directed    No driving for 2 weeks then no driving if taking narcotics   Lifting restrictions   Complete by: As directed    No lifting > 5 lbs until cleared by MD   Resume previous diet   Complete by: As directed      Allergies as of 01/18/2019   No Known Allergies     Medication List    TAKE these medications   cetirizine 10 MG tablet Commonly known as: ZYRTEC Take 10 mg by mouth daily.   escitalopram 5 MG tablet Commonly known as: LEXAPRO Take 5 mg by mouth daily.   MECLIZINE HCL PO Take by mouth as needed. Vertigo   methocarbamol 500 MG tablet Commonly known as: Robaxin Take 1 tablet (500 mg total) by mouth every 8 (eight) hours as needed for muscle spasms.   multivitamin capsule Take 1 capsule by mouth daily.   Slynd 4 MG Tabs Generic drug: Drospirenone   sulfamethoxazole-trimethoprim 800-160 MG tablet Commonly known as: BACTRIM DS Take 1 tablet by mouth 2 (two) times daily.   traMADol 50 MG tablet Commonly known as: Ultram Take 1 tablet (50 mg total) by mouth every 6 (six) hours as needed.      Follow-up Information    Irene Limbo, MD In 1 week.   Specialty: Plastic Surgery Why: as scheduled Contact information: [address] [place] [address] [phone]        Erroll Luna, MD. Schedule an appointment as soon as possible for a visit in 2 weeks.   Specialty:  General Surgery Contact information: [address] [place] [address] [phone]           Signed: Irene Limbo 01/18/2019, 7:41 AM

## 2019-01-23 ENCOUNTER — Encounter: Payer: Self-pay | Admitting: *Deleted

## 2019-01-23 LAB — SURGICAL PATHOLOGY

## 2019-02-12 NOTE — Progress Notes (Signed)
[facility]  [phone] [phone]     ID: Stephanie [name] DOB: 08/03/1985  MR#: [id number]  TKZ#:[id number]  Patient Care Team: Manon Hilding, MD as PCP - General (Family Medicine) Erroll Luna, MD as Consulting Physician (General Surgery) Irene Limbo, MD as Consulting Physician (Plastic Surgery) Raedyn Wenke, Virgie Dad, MD as Consulting Physician (Oncology) Eppie Gibson, MD as Consulting Physician (Radiation Oncology) Rockwell Germany, RN as Oncology Nurse Navigator Mauro Kaufmann, RN as Oncology Nurse Navigator Azucena Fallen, MD as Consulting Physician (Obstetrics and Gynecology) Chauncey Cruel, MD OTHER MD:  CHIEF COMPLAINT: 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  CURRENT TREATMENT: Observation   INTERVAL HISTORY: Stephanie [name] returns today for follow up of her microinvasive breast cancer.   Since her last visit, she met with genetics via WebEx on 11/15/2018 to discuss the results of her prior testing on 10/25/2018 through [employer], which were negative.  She opted to proceed with bilateral mastectomies on 01/17/2019 under Dr. Brantley Stage with immediate reconstruction under Dr. Iran Planas. Pathology from the procedure ([id number]) showed:  1. Right Breast  - benign breast tissue 2. 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 Lymph Nodes, left axillary  - negative for carcinoma (0/4)   REVIEW OF SYSTEMS: Stephanie [name] did well with her surgery.  She cannot for drains for 1 week and then 2 drains an additional week.  She did not have problems with dehiscence erythema or unusual pain or fever.  She just went off the methocarbamol and she has been off the tramadol for some time.  She is walking for exercise.  She has not yet started physical therapy. She is on a progesterone only  contraceptive and she does not find it as comfortable or helpful as her prior estrogen/progesterone OCP.  She is concerned because of the microinvasive disease found in the pathology report and she had many questions regarding that.  Aside from these issues a detailed review of systems today was stable   HISTORY OF CURRENT ILLNESS: From the original intake note:  She underwent routine baseline screening mammogram in early October that showed calcifications spanning 2cm in the upper outer left breast.  She underwent stereotactic biopsy performed on 10/24/2018 and this showed ductal carcinoma in situ, estrogen receptor positive, progesterone receptor negative.  The patient's subsequent history is as detailed below.   PAST MEDICAL HISTORY: Past Medical History:  Diagnosis Date  . Breast cancer ([facility])   . Family history of breast cancer   . Family history of lung cancer   . Family history of rectal cancer   . Melanoma ([facility] [place])   . PONV (postoperative nausea and vomiting)     PAST SURGICAL HISTORY: Past Surgical History:  Procedure Laterality Date  . BREAST RECONSTRUCTION WITH PLACEMENT OF TISSUE EXPANDER AND ALLODERM Bilateral 01/17/2019   Procedure: BILATERAL BREAST RECONSTRUCTION WITH PLACEMENT OF TISSUE EXPANDER AND ALLODERM;  Surgeon: Irene Limbo, MD;  Location: [facility];  Service: Plastics;  Laterality: Bilateral;  . melanoma surgery Right 2009   upper right arm  . NIPPLE SPARING MASTECTOMY WITH SENTINEL LYMPH NODE BIOPSY Bilateral 01/17/2019   Procedure: BILATERAL NIPPLE SPARING MASTECTOMIES WITH LEFT SENTINEL LYMPH NODE MAPPING;  Surgeon: Erroll Luna, MD;  Location: [facility];  Service: General;  Laterality: Bilateral;  . WISDOM TOOTH EXTRACTION  FAMILY HISTORY Family History  Problem Relation Age of Onset  . Breast cancer Maternal Aunt        diagnosed 99s  . Breast cancer Maternal Grandmother        diagnosed 8s  . Breast cancer  Paternal Grandmother        diagnosed 27s  . Lung cancer Paternal Grandmother        thought to be breast cancer mets  . Rectal cancer Paternal Grandmother        diagnosed 21s  . Hypertension Mother   . Heart disease Father   . Lung cancer Paternal Aunt        diagnosed late 85s  . Lung cancer Paternal Grandfather        diagnosed 26s    GYNECOLOGIC HISTORY:  No LMP recorded. Menarche: 34 years old GX P 0 Contraceptive Loestrin, OCP for 12-13 years, now on Slynd Hysterectomy? no Salpingo-oophorectomy?no   SOCIAL HISTORY: (updated 10/2018) Stephanie [name] is married and lives with her husband Eland in [place], [place].  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 [place] enjoys exercising, and running.  She also enjoys hunting and fishing with her husband along with spending time with her nieces and nephews.  Stephanie [name] does not plan on childbearing in the future.    ADVANCED DIRECTIVES: In the absence of any documentation to the contrary, the patient's spouse is their HCPOA.    HEALTH MAINTENANCE: Social History   Tobacco Use  . Smoking status: Never Smoker  . Smokeless tobacco: Never Used  Substance Use Topics  . Alcohol use: Yes    Comment: occasional  . Drug use: Never     Colonoscopy: n/a (age)  PAP: 09/2018  Bone density: n/a (age)   No Known Allergies  Current Outpatient Medications  Medication Sig Dispense Refill  . cetirizine (ZYRTEC) 10 MG tablet Take 10 mg by mouth daily.     Marland Kitchen escitalopram (LEXAPRO) 5 MG tablet Take 5 mg by mouth daily.    . MECLIZINE HCL PO Take by mouth as needed. Vertigo    . methocarbamol (ROBAXIN) 500 MG tablet Take 1 tablet (500 mg total) by mouth every 8 (eight) hours as needed for muscle spasms. 30 tablet 0  . Multiple Vitamin (MULTIVITAMIN) capsule Take 1 capsule by mouth daily.    Marland Kitchen SLYND 4 MG TABS     . sulfamethoxazole-trimethoprim (BACTRIM DS) 800-160 MG tablet Take 1 tablet by mouth 2  (two) times daily. 12 tablet 0  . traMADol (ULTRAM) 50 MG tablet Take 1 tablet (50 mg total) by mouth every 6 (six) hours as needed. 20 tablet 0   No current facility-administered medications for this visit.    OBJECTIVE: Stephanie [name] in no acute distress  Vitals:   02/13/19 1135  BP: 111/63  Pulse: 68  Resp: 18  Temp: 98.4 F (36.9 C)  SpO2: 100%     Body mass index is 24.13 kg/m.   Wt Readings from Last 3 Encounters:  02/13/19 149 lb 8 oz (67.8 kg)  01/17/19 146 lb 2.6 oz (66.3 kg)  11/09/18 148 lb 3.2 oz (67.2 kg)  ECOG FS:1 - Symptomatic but completely ambulatory  Sclerae unicteric, EOMs intact Wearing a mask No cervical or supraclavicular adenopathy Lungs no rales or rhonchi Heart regular rate and rhythm Abd soft, nontender, positive bowel sounds MSK no focal spinal tenderness, no upper extremity lymphedema Neuro: nonfocal, well oriented, appropriate affect Breasts: Status post bilateral  mastectomies with bilateral expanders in place.  There is good symmetry, the right breast being slightly smaller than the left at this point.  There is some rippling of the expanders.  There is no dehiscence, erythema, or swelling.  Both axillae are benign.   LAB RESULTS:  CMP     Component Value Date/Time   NA 140 01/12/2019 0930   K 4.8 01/12/2019 0930   CL 109 01/12/2019 0930   CO2 25 01/12/2019 0930   GLUCOSE 85 01/12/2019 0930   BUN 12 01/12/2019 0930   CREATININE 1.09 (H) 01/12/2019 0930   CALCIUM 9.2 01/12/2019 0930   PROT 6.8 01/12/2019 0930   ALBUMIN 4.1 01/12/2019 0930   AST 23 01/12/2019 0930   ALT 24 01/12/2019 0930   ALKPHOS 98 01/12/2019 0930   BILITOT 0.6 01/12/2019 0930   GFRNONAA >60 01/12/2019 0930   GFRAA >60 01/12/2019 0930    No results found for: TOTALPROTELP, ALBUMINELP, A1GS, A2GS, BETS, BETA2SER, GAMS, MSPIKE, SPEI  No results found for: Nils Pyle, [facility]  Lab Results  Component Value Date   WBC 8.5 01/12/2019    NEUTROABS 5.7 01/12/2019   HGB 13.0 01/12/2019   HCT 41.2 01/12/2019   MCV 81.6 01/12/2019   PLT 263 01/12/2019      Chemistry      Component Value Date/Time   NA 140 01/12/2019 0930   K 4.8 01/12/2019 0930   CL 109 01/12/2019 0930   CO2 25 01/12/2019 0930   BUN 12 01/12/2019 0930   CREATININE 1.09 (H) 01/12/2019 0930      Component Value Date/Time   CALCIUM 9.2 01/12/2019 0930   ALKPHOS 98 01/12/2019 0930   AST 23 01/12/2019 0930   ALT 24 01/12/2019 0930   BILITOT 0.6 01/12/2019 0930      No results found for: LABCA2  No components found for: [id number]  No results for input(s): INR in the last 168 hours.  No results found for: LABCA2  No results found for: YQM578  No results found for: ION629  No results found for: BMW413  No results found for: CA2729  No components found for: HGQUANT  No results found for: CEA1 / No results found for: CEA1   No results found for: AFPTUMOR  No results found for: CHROMOGRNA  No results found for: HGBA, HGBA2QUANT, HGBFQUANT, HGBSQUAN (Hemoglobinopathy evaluation)   No results found for: LDH  No results found for: IRON, TIBC, IRONPCTSAT (Iron and TIBC)  No results found for: FERRITIN  Urinalysis No results found for: COLORURINE, APPEARANCEUR, LABSPEC, PHURINE, GLUCOSEU, HGBUR, BILIRUBINUR, KETONESUR, PROTEINUR, UROBILINOGEN, NITRITE, LEUKOCYTESUR   STUDIES: NM Sentinel Node Inj-No Rpt (Breast)  Result Date: 01/17/2019 Sulfur colloid was injected by the nuclear medicine technologist for melanoma sentinel node.    ASSESSMENT: 34 y.o. [name] with newly diagnosed left breast DCIS, ER positive biopsied on 10/24/2018   (1) Bilateral Breast MRI on 11/02/2018 shows biopsy proven left breast DCIS spanning 2.6 x 2.3 x 2.5 cm, no other left breast malignancy, no evidence of right breast malignancy  (2) Genetic testing 11/03/2018 through the  Multi-Cancer Panel offered by [facility] found no deleterious mutations in AIP, ALK,  APC, ATM, AXIN2,BAP1,  BARD1, BLM, BMPR1A, BRCA1, BRCA2, BRIP1, CASR, CDC73, CDH1, CDK4, CDKN1B, CDKN1C, CDKN2A (p14ARF), CDKN2A (p16INK4a), CEBPA, CHEK2, CTNNA1, DICER1, DIS3L2, EGFR (c.2369C>T, p.Thr790Met variant only), EPCAM (Deletion/duplication testing only), FH, FLCN, GATA2, GPC3, GREM1 (Promoter region deletion/duplication testing only), HOXB13 (c.251G>A, p.Gly84Glu), HRAS, KIT, MAX, MEN1, MET, MITF (c.952G>A, p.Glu318Lys variant only),  MLH1, MSH2, MSH3, MSH6, MUTYH, NBN, NF1, NF2, NTHL1, PALB2, PDGFRA, PHOX2B, PMS2, POLD1, POLE, POT1, PRKAR1A, PTCH1, PTEN, RAD50, RAD51C, RAD51D, RB1, RECQL4, RET, RUNX1, SDHAF2, SDHA (sequence changes only), SDHB, SDHC, SDHD, SMAD4, SMARCA4, SMARCB1, SMARCE1, STK11, SUFU, TERC, TERT, TMEM127, TP53, TSC1, TSC2, VHL, WRN and WT1.    (3) status post bilateral nipple sparing mastectomies with left sentinel lymph node sampling, showing  (a) on the right, no evidence of malignancy  (b) on the left, pT66m pN0, stage IA invasive carcinoma, grade 2, with negative margins   (i) four left axillary sentinel nodes removed   (ii) ductal carcinoma in situ, grade 3, also present, with negative margins   (iii) insufficient invasive tissue for prognostic panel evaluation  (4) Adjuvant radiation not indicated  (4) Anti-estrogen optional: patient opted against   PLAN: Stephanie [name] did very well with her surgery and is continuing to recover uneventfully.  She hopes to get the definitive implants placed relatively soon and then be able to return to full activity without restrictions.  She has not yet been contacted by physical therapy and I have gone ahead and placed that referral again to make sure that happens.  We discussed the fact that she had 4 lymph nodes removed from the left armpit.  She should always use the right arm for blood draw, blood pressure, etc.  We then went into her pathology results in detail.  She understands the right breast was benign.  In the left breast  mostly what we found was ductal carcinoma in situ.  All the margins were clear and she understands the cure rate for ductal carcinoma in situ with mastectomy approaches 100%.  She did have a microinvasive focus which was less than a millimeter.  We reviewed NCCN guidelines for tumors 5 mm or less (this would be 5-10 times the size of her microinvasion).  In those cases the risk of spread outside the breast is so small that antiestrogens are optional.  I cannot believe her risk of spread outside the breast of this <1 mm deposit is greater than 1 or at most 2%.  If she took tamoxifen for 5 years then the benefit would be 1% risk reduction or less.  Incidentally this is similar to her risk of local recurrence.  She understands we do have some data for tamoxifen at 5 mg daily instead of 20.  We reviewed the possible side effects toxicities and complications of this agent.  She is very clear that she would prefer not to take tamoxifen unless it is absolutely necessary and it certainly is not necessary in her case.  Accordingly the plan is for observation only.  She is welcome to go back to her estrogen/progesterone contraceptives if she wishes.  She is also considering a levonorgestrel IUD.  I am comfortable with all those choices.  She understands also she and her husband decide to have children there is no contraindication to that from a breast cancer point of view  I am making her a return appointment in 1 year just to make sure there are no residual questions.  At that point very likely she will be discharged from follow-up  She knows to call for any other issue that may develop before then.  Total encounter time 35 minutes.*Sarajane JewsC. Balbina Depace, MD  02/13/2019 12:09 PM Medical Oncology and H[facility]ology [facility] [place] [id number]. [phone]   Fax. [phone]  IPat Kocher am  acting as scribe for Dr. Sarajane Jews C. Jaziel Bennett.  I, Lurline Del MD, have reviewed the above documentation for accuracy and completeness, and I agree with the above.    *Total Encounter Time as defined by the Centers for Medicare and Medicaid Services includes, in addition to the face-to-face time of a patient visit (documented in the note above) non-face-to-face time: obtaining and reviewing outside history, ordering and reviewing medications, tests or procedures, care coordination (communications with other health care professionals or caregivers) and documentation in the medical record.

## 2019-02-13 ENCOUNTER — Encounter: Payer: Self-pay | Admitting: *Deleted

## 2019-02-13 ENCOUNTER — Other Ambulatory Visit: Payer: Self-pay

## 2019-02-13 ENCOUNTER — Inpatient Hospital Stay: Payer: BC Managed Care – PPO | Attending: Genetic Counselor | Admitting: Oncology

## 2019-02-13 VITALS — BP 111/63 | HR 68 | Temp 98.4°F | Resp 18 | Ht 66.0 in | Wt 149.5 lb

## 2019-02-13 DIAGNOSIS — D0512 Intraductal carcinoma in situ of left breast: Secondary | ICD-10-CM | POA: Diagnosis not present

## 2019-02-13 DIAGNOSIS — Z801 Family history of malignant neoplasm of trachea, bronchus and lung: Secondary | ICD-10-CM | POA: Insufficient documentation

## 2019-02-13 DIAGNOSIS — C50412 Malignant neoplasm of upper-outer quadrant of left female breast: Secondary | ICD-10-CM | POA: Diagnosis not present

## 2019-02-13 DIAGNOSIS — Z170 Estrogen receptor positive status [ER+]: Secondary | ICD-10-CM | POA: Diagnosis not present

## 2019-02-13 DIAGNOSIS — Z803 Family history of malignant neoplasm of breast: Secondary | ICD-10-CM | POA: Diagnosis not present

## 2019-02-13 DIAGNOSIS — Z79899 Other long term (current) drug therapy: Secondary | ICD-10-CM | POA: Diagnosis not present

## 2019-02-13 DIAGNOSIS — Z8249 Family history of ischemic heart disease and other diseases of the circulatory system: Secondary | ICD-10-CM | POA: Insufficient documentation

## 2019-02-13 DIAGNOSIS — Z800 Family history of malignant neoplasm of digestive organs: Secondary | ICD-10-CM | POA: Diagnosis not present

## 2019-02-13 DIAGNOSIS — Z9013 Acquired absence of bilateral breasts and nipples: Secondary | ICD-10-CM | POA: Diagnosis not present

## 2019-02-13 DIAGNOSIS — Z85820 Personal history of malignant melanoma of skin: Secondary | ICD-10-CM | POA: Diagnosis not present

## 2019-02-14 ENCOUNTER — Encounter: Payer: Self-pay | Admitting: Physical Therapy

## 2019-02-14 ENCOUNTER — Ambulatory Visit: Payer: BC Managed Care – PPO | Attending: Oncology | Admitting: Physical Therapy

## 2019-02-14 ENCOUNTER — Telehealth: Payer: Self-pay | Admitting: Oncology

## 2019-02-14 DIAGNOSIS — M6281 Muscle weakness (generalized): Secondary | ICD-10-CM | POA: Diagnosis not present

## 2019-02-14 DIAGNOSIS — M25611 Stiffness of right shoulder, not elsewhere classified: Secondary | ICD-10-CM

## 2019-02-14 DIAGNOSIS — M25612 Stiffness of left shoulder, not elsewhere classified: Secondary | ICD-10-CM

## 2019-02-14 DIAGNOSIS — Z483 Aftercare following surgery for neoplasm: Secondary | ICD-10-CM | POA: Diagnosis not present

## 2019-02-14 NOTE — Therapy (Signed)
[place], [place], [address] Phone: [phone]   Fax:  [phone]  Physical Therapy Evaluation  Patient Details  Name: Stephanie [name] MRN: [id number] Date of Birth: February 16, 1985 Referring Provider (PT): Dr. Vinie Sill   Encounter Date: 02/14/2019  PT End of Session - 02/14/19 1544    Visit Number  1    Number of Visits  9    Date for PT Re-Evaluation  03/14/19    PT Start Time  1503    PT Stop Time  1545    PT Time Calculation (min)  42 min    Activity Tolerance  Patient tolerated treatment well    Behavior During Therapy  [facility] for tasks assessed/performed       Past Medical History:  Diagnosis Date  . Breast cancer ([place])   . Family history of breast cancer   . Family history of lung cancer   . Family history of rectal cancer   . Melanoma ([place])   . PONV (postoperative nausea and vomiting)     Past Surgical History:  Procedure Laterality Date  . BREAST RECONSTRUCTION WITH PLACEMENT OF TISSUE EXPANDER AND ALLODERM Bilateral 01/17/2019   Procedure: BILATERAL BREAST RECONSTRUCTION WITH PLACEMENT OF TISSUE EXPANDER AND ALLODERM;  Surgeon: Irene Limbo, MD;  Location: [facility];  Service: Plastics;  Laterality: Bilateral;  . melanoma surgery Right 2009   upper right arm  . NIPPLE SPARING MASTECTOMY WITH SENTINEL LYMPH NODE BIOPSY Bilateral 01/17/2019   Procedure: BILATERAL NIPPLE SPARING MASTECTOMIES WITH LEFT SENTINEL LYMPH NODE MAPPING;  Surgeon: Erroll Luna, MD;  Location: [facility];  Service: General;  Laterality: Bilateral;  . WISDOM TOOTH EXTRACTION      There were no vitals filed for this visit.   Subjective Assessment - 02/14/19 1504    Subjective  My left side is more tight than the right. The side is where they removed lymph nodes. It is hard to shave my armpits because it is hard to get my arm up.    Pertinent History  L breast cancer, DCIS and ER+,  bilateral mastectomy 01/17/19, immediate reconstruction, SLNB 4 nodes all negative, melanoma in upper right arm in 2009 stage 2    Patient Stated Goals  get range of motion back, ease back in to work outs and exercise and knowing what is ok to do    Currently in Pain?  No/denies         [facility] PT Assessment - 02/14/19 0001      Assessment   Medical Diagnosis  left breast cancer    Referring Provider (PT)  Dr. Vinie Sill    Onset Date/Surgical Date  01/17/19    Hand Dominance  Right    Prior Therapy  none      Precautions   Precautions  Other (comment)    Precaution Comments  at risk of lymphedema      Restrictions   Weight Bearing Restrictions  No      Balance Screen   Has the patient fallen in the past 6 months  No    Has the patient had a decrease in activity level because of a fear of falling?   No    Is the patient reluctant to leave their home because of a fear of falling?   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    Leisure  exercises 4x/wk - running, high intensity interval training, weight training, fitness classes- each day works out for about an hour      Cognition   Overall Cognitive Status  Within Functional Limits for tasks assessed      Observation/Other Assessments   Observations  scars intact over incisions- healing well      ROM / Strength   AROM / PROM / Strength  AROM      AROM   AROM Assessment Site  Shoulder    Right/Left Shoulder  Right;Left    Right Shoulder Flexion  145 Degrees    Right Shoulder ABduction  156 Degrees    Right Shoulder Internal Rotation  50 Degrees    Right Shoulder  External Rotation  90 Degrees    Left Shoulder Flexion  140 Degrees    Left Shoulder ABduction  140 Degrees    Left Shoulder Internal Rotation  64 Degrees    Left Shoulder External Rotation  90 Degrees        LYMPHEDEMA/ONCOLOGY QUESTIONNAIRE - 02/14/19 1519      Type   Cancer Type  left breast cancer      Surgeries   Mastectomy Date  01/17/19    Sentinel Lymph Node Biopsy Date  01/17/19    Number Lymph Nodes Removed  4   all negative     Treatment   Active Chemotherapy Treatment  No    Past Chemotherapy Treatment  No    Active Radiation Treatment  No    Past Radiation Treatment  No    Current Hormone Treatment  No    Past Hormone Therapy  No      What other symptoms do you have   Are you Having Heaviness or Tightness  Yes    Are you having Pain  Yes    Are you having pitting edema  No    Is it Hard or Difficult finding clothes that fit  No    Do you have infections  No    Is there Decreased scar mobility  Yes      Lymphedema Assessments   Lymphedema Assessments  Upper extremities      Right Upper Extremity Lymphedema   15 cm Proximal to Olecranon Process  31 cm    Olecranon Process  24.1 cm    15 cm Proximal to Ulnar Styloid Process  24 cm    Just Proximal to Ulnar Styloid Process  14.5 cm    Across Hand at [employer]  18.5 cm    At [place] of 2nd Digit  5.6 cm      Left Upper Extremity Lymphedema   15 cm Proximal to Olecranon Process  31.7 cm    Olecranon Process  25 cm    15 cm Proximal to Ulnar Styloid Process  23.5 cm    Just Proximal to Ulnar Styloid Process  14.5 cm    Across Hand at [employer]  17.9 cm    At [place] of 2nd Digit  5.5 cm          Quick Dash - 02/14/19  0001    Open a tight or new jar  Mild difficulty    Do heavy household chores (wash walls, wash floors)  Moderate difficulty    Carry a shopping bag or briefcase  No difficulty    Wash your back  Mild difficulty    Use a knife to cut food  No difficulty    Recreational  activities in which you take some force or impact through your arm, shoulder, or hand (golf, hammering, tennis)  Severe difficulty    During the past week, to what extent has your arm, shoulder or hand problem interfered with your normal social activities with family, friends, neighbors, or groups?  Slightly    During the past week, to what extent has your arm, shoulder or hand problem limited your work or other regular daily activities  Modererately    Arm, shoulder, or hand pain.  Mild    Tingling (pins and needles) in your arm, shoulder, or hand  None    Difficulty Sleeping  Mild difficulty    DASH Score  27.27 %        Objective measurements completed on examination: See above findings.      [facility] Adult PT Treatment/Exercise - 02/14/19 0001      Exercises   Exercises  Shoulder      Shoulder Exercises: Supine   Flexion  AAROM;Both;Other (comment)   2 reps with dowel with 5 sec holds- pt returned demo   ABduction  AAROM;Left   2 reps with dowel, 5 sec holds, pt returned demo            PT Education - 02/14/19 1544    Education Details  lymphedema risk reduction practices, breast post op exercises, supine dowel flexion and abduction    Person(s) Educated  Patient    Methods  Handout;Explanation;Demonstration    Comprehension  Verbalized understanding;Returned demonstration          PT Long Term Goals - 02/14/19 1550      PT LONG TERM GOAL #1   Title  Pt will demonstrate 160 degrees of bilateral shoulder flexion to allow her to reach up in to high cabinets at home.    Baseline  R 145, L 156    Time  4    Period  Weeks    Status  New    Target Date  03/14/19      PT LONG TERM GOAL #2   Title  Pt will demonstrate 160 degrees of bilateral shoulder abduction to allow her to reach out to the side.    Baseline  R 140, L 140    Time  4    Period  Weeks    Status  New    Target Date  03/14/19      PT LONG TERM GOAL #3   Title  Pt will be independent in a home  exercise program for continued strengtheing and stretching.    Time  4    Period  Weeks    Status  New    Target Date  03/14/19      PT LONG TERM GOAL #4   Title  Pt will receive appropriate prophylactic sleeve and gauntlet to wear when she flies in March to decrease risk of lymphedema.    Time  4    Period  Weeks    Status  New    Target Date  03/14/19      PT LONG TERM GOAL #5   Title  Pt will be able to sleep comfortably at night and be able to go from lying down to sitting without increase pain.    Time  4    Period  Weeks    Status  New    Target Date  03/14/19             Plan - 02/14/19 1545    Clinical Impression Statement  Pt presents to PT with decreased bilateral shoulder ROM after undergoing bilateral mastectomy with L SLNB for treatment of left breast cancer. She does not require chemo or radiation. She has expanders in place. She is having trouble reaching in high cabinets and also shaving under left arm due to limited range of motion. Her scars are healing and she does have spasms when she goes from lying down to sitting up. Pt would benefit from skilled PT services to improve bilateral shoulder ROM, improve scar mobility, and progress pt with a home exercise program so she can get back to exercising at home without increasing her risk of developing lymphedema.    Examination-Activity Limitations  Reach Overhead;Carry;Lift    Examination-Participation Restrictions  Cleaning;Meal Prep    Stability/Clinical Decision Making  Stable/Uncomplicated    Clinical Decision Making  Low    Rehab Potential  Excellent    PT Frequency  2x / week    PT Duration  4 weeks    PT Treatment/Interventions  ADLs/Self Care Home Management;Therapeutic exercise;Therapeutic activities;Manual techniques;Passive range of motion;Scar mobilization;Taping    PT Next Visit Plan  pulleys, ball, PROM to bilateral shoulders, can do scar mobs when they are healed, supine scap once ROM is full, check  for signed Rx and send to a special place    PT Home Exercise Plan  supine dowel shoulder, post op breast    Recommended Other Services  sent script to [facility] for prophylactic sleeve and glove    Consulted and Agree with Plan of Care  Patient       Patient will benefit from skilled therapeutic intervention in order to improve the following deficits and impairments:  Pain, Postural dysfunction, Impaired UE functional use, Increased fascial restricitons, Decreased strength, Decreased knowledge of precautions, Decreased range of motion, Increased muscle spasms  Visit Diagnosis: Stiffness of left shoulder, not elsewhere classified  Stiffness of right shoulder, not elsewhere classified  Aftercare following surgery for neoplasm  Muscle weakness (generalized)     Problem List Patient Active Problem List   Diagnosis Date Noted  . Family history of breast cancer   . Family history of rectal cancer   . Family history of lung cancer   . 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 Genetic testing 11/03/2018  . Malignant neoplasm of upper-outer quadrant of left breast in female, estrogen receptor positive ([place]) 10/20/2018  . Hip pain, acute, left 07/30/2016  . Neck pain 08/10/2011  . Musculoskeletal pain 08/10/2011  . KNEE PAIN, RIGHT 01/30/2010  . 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 UNEQUAL LEG LENGTH 01/30/2010  . ABNORMALITY OF GAIT 01/30/2010    Allyson Sabal [facility] 02/14/2019, 3:53 PM  [place], [place], [address] Phone: [phone]   Fax:  [phone]  Name: Stephanie [name] MRN: [id number] Date of Birth: 1985/05/08  Allyson Sabal Ballinger, PT 02/14/19 3:54 PM

## 2019-02-14 NOTE — Telephone Encounter (Signed)
I talk with patient regarding schedule  

## 2019-02-14 NOTE — Patient Instructions (Signed)
Shoulder: Flexion (Supine)    With hands shoulder width apart, slowly lower dowel to floor behind head. Do not let elbows bend. Keep back flat. Hold 5-30____ seconds. Repeat __10__ times. Do _2___ sessions per day. CAUTION: Stretch slowly and gently.  Copyright  VHI. All rights reserved.  Shoulder: Abduction (Supine)    With right arm flat on floor, hold dowel in palm. Slowly move arm up to side of head by pushing with opposite arm. Do not let elbow bend. Repeat on opposite side. Hold _5-30___ seconds. Repeat __10__ times. Do __2__ sessions per day. CAUTION: Stretch slowly and gently.  Copyright  VHI. All rights reserved.

## 2019-02-17 ENCOUNTER — Encounter: Payer: Self-pay | Admitting: Rehabilitation

## 2019-02-17 ENCOUNTER — Ambulatory Visit: Payer: BC Managed Care – PPO | Admitting: Rehabilitation

## 2019-02-17 ENCOUNTER — Other Ambulatory Visit: Payer: Self-pay

## 2019-02-17 DIAGNOSIS — M6281 Muscle weakness (generalized): Secondary | ICD-10-CM

## 2019-02-17 DIAGNOSIS — Z483 Aftercare following surgery for neoplasm: Secondary | ICD-10-CM | POA: Diagnosis not present

## 2019-02-17 DIAGNOSIS — M25612 Stiffness of left shoulder, not elsewhere classified: Secondary | ICD-10-CM

## 2019-02-17 DIAGNOSIS — M25611 Stiffness of right shoulder, not elsewhere classified: Secondary | ICD-10-CM | POA: Diagnosis not present

## 2019-02-17 NOTE — Therapy (Signed)
[place], [place], [address] Phone: [phone]   Fax:  [phone]  Physical Therapy Treatment  Patient Details  Name: Stephanie [name] MRN: [id number] Date of Birth: 1985-03-07 Referring Provider (PT): Dr. Vinie Sill   Encounter Date: 02/17/2019  PT End of Session - 02/17/19 1154    Visit Number  2    Number of Visits  9    Date for PT Re-Evaluation  03/14/19    PT Start Time  1102    PT Stop Time  1145    PT Time Calculation (min)  43 min    Activity Tolerance  Patient tolerated treatment well    Behavior During Therapy  [facility] for tasks assessed/performed       Past Medical History:  Diagnosis Date  . Breast cancer ([place])   . Family history of breast cancer   . Family history of lung cancer   . Family history of rectal cancer   . Melanoma ([place])   . PONV (postoperative nausea and vomiting)     Past Surgical History:  Procedure Laterality Date  . BREAST RECONSTRUCTION WITH PLACEMENT OF TISSUE EXPANDER AND ALLODERM Bilateral 01/17/2019   Procedure: BILATERAL BREAST RECONSTRUCTION WITH PLACEMENT OF TISSUE EXPANDER AND ALLODERM;  Surgeon: Irene Limbo, MD;  Location: [facility];  Service: Plastics;  Laterality: Bilateral;  . melanoma surgery Right 2009   upper right arm  . NIPPLE SPARING MASTECTOMY WITH SENTINEL LYMPH NODE BIOPSY Bilateral 01/17/2019   Procedure: BILATERAL NIPPLE SPARING MASTECTOMIES WITH LEFT SENTINEL LYMPH NODE MAPPING;  Surgeon: Erroll Luna, MD;  Location: [facility];  Service: General;  Laterality: Bilateral;  . WISDOM TOOTH EXTRACTION      There were no vitals filed for this visit.  Subjective Assessment - 02/17/19 1104    Subjective  I am doing ok. Stretches were fine    Pertinent History  L breast cancer, DCIS and ER+, bilateral mastectomy 01/17/19, immediate reconstruction, SLNB 4 nodes all negative, melanoma in upper right arm in 2009 stage  2    Patient Stated Goals  get range of motion back, ease back in to work outs and exercise and knowing what is ok to do    Currently in Pain?  No/denies                       [facility] Adult PT Treatment/Exercise - 02/17/19 0001      Exercises   Exercises  Other Exercises    Other Exercises   supine diaphragmatic breathing x 10 with pt exhibiting good form      Shoulder Exercises: Supine   Protraction  AROM;Both;10 reps    Protraction Limitations  pro/ret    Flexion  AAROM;Both;Other (comment)    Other Supine Exercises  behind the head stretch 5" x 5    Other Supine Exercises  attempted bil shoulder elevation and depression but with some increased spasm in the right chest near the incision      Shoulder Exercises: Pulleys   Flexion  2 minutes    Flexion Limitations  with instruction for initial performance     ABduction  2 minutes    ABduction Limitations  with instruction      Shoulder Exercises: Therapy Ball   Flexion  Both;5 reps    Flexion Limitations  instruction for initial performance      Manual Therapy   Manual Therapy  Passive ROM    Passive  ROM  to bil shoulders; flexion, abduction, ER, and D2 to tolerance                  PT Long Term Goals - 02/14/19 1550      PT LONG TERM GOAL #1   Title  Pt will demonstrate 160 degrees of bilateral shoulder flexion to allow her to reach up in to high cabinets at home.    Baseline  R 145, L 156    Time  4    Period  Weeks    Status  New    Target Date  03/14/19      PT LONG TERM GOAL #2   Title  Pt will demonstrate 160 degrees of bilateral shoulder abduction to allow her to reach out to the side.    Baseline  R 140, L 140    Time  4    Period  Weeks    Status  New    Target Date  03/14/19      PT LONG TERM GOAL #3   Title  Pt will be independent in a home exercise program for continued strengtheing and stretching.    Time  4    Period  Weeks    Status  New    Target Date  03/14/19       PT LONG TERM GOAL #4   Title  Pt will receive appropriate prophylactic sleeve and gauntlet to wear when she flies in March to decrease risk of lymphedema.    Time  4    Period  Weeks    Status  New    Target Date  03/14/19      PT LONG TERM GOAL #5   Title  Pt will be able to sleep comfortably at night and be able to go from lying down to sitting without increase pain.    Time  4    Period  Weeks    Status  New    Target Date  03/14/19            Plan - 02/17/19 1154    Clinical Impression Statement  Began treatment today with AAROM, postural, and PROM focus all tolerated well.  Pt with questions regarding scabbing at the left incision scab vs glue appearing to be scab and instructed not to pull it off.  Pt reports movement is already getting easier but still some sight apprehension with PROM on the left today.    PT Frequency  2x / week    PT Duration  4 weeks    PT Treatment/Interventions  ADLs/Self Care Home Management;Therapeutic exercise;Therapeutic activities;Manual techniques;Passive range of motion;Scar mobilization;Taping    PT Next Visit Plan  pulleys, ball, PROM to bilateral shoulders, can do scar mobs when they are healed, supine scap once ROM is full    PT Home Exercise Plan  supine dowel shoulder, post op breast    Recommended Other Services  pt given script and details for a special place       Patient will benefit from skilled therapeutic intervention in order to improve the following deficits and impairments:     Visit Diagnosis: Stiffness of left shoulder, not elsewhere classified  Stiffness of right shoulder, not elsewhere classified  Aftercare following surgery for neoplasm  Muscle weakness (generalized)     Problem List Patient Active Problem List   Diagnosis Date Noted  . Family history of breast cancer   . Family history of rectal cancer   .  Family history of lung cancer   . 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  Genetic testing 11/03/2018  . Malignant neoplasm of upper-outer quadrant of left breast in female, estrogen receptor positive ([place]) 10/20/2018  . Hip pain, acute, left 07/30/2016  . Neck pain 08/10/2011  . Musculoskeletal pain 08/10/2011  . KNEE PAIN, RIGHT 01/30/2010  . 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 UNEQUAL LEG LENGTH 01/30/2010  . ABNORMALITY OF GAIT 01/30/2010    Stark Bray 02/17/2019, 11:57 AM  [place], [place], [address] Phone: [phone]   Fax:  [phone]  Name: Stephanie [name] MRN: [id number] Date of Birth: Sep 17, 1985

## 2019-02-21 ENCOUNTER — Other Ambulatory Visit: Payer: Self-pay

## 2019-02-21 ENCOUNTER — Encounter: Payer: Self-pay | Admitting: Physical Therapy

## 2019-02-21 ENCOUNTER — Ambulatory Visit: Payer: BC Managed Care – PPO | Admitting: Physical Therapy

## 2019-02-21 DIAGNOSIS — Z483 Aftercare following surgery for neoplasm: Secondary | ICD-10-CM | POA: Diagnosis not present

## 2019-02-21 DIAGNOSIS — M25612 Stiffness of left shoulder, not elsewhere classified: Secondary | ICD-10-CM

## 2019-02-21 DIAGNOSIS — M6281 Muscle weakness (generalized): Secondary | ICD-10-CM

## 2019-02-21 DIAGNOSIS — M25611 Stiffness of right shoulder, not elsewhere classified: Secondary | ICD-10-CM | POA: Diagnosis not present

## 2019-02-21 NOTE — Therapy (Signed)
[place], [place], [address] Phone: [phone]   Fax:  [phone]  Physical Therapy Treatment  Patient Details  Name: Stephanie [name] MRN: [id number] Date of Birth: 05/02/1985 Referring Provider (PT): Dr. Vinie Sill   Encounter Date: 02/21/2019  PT End of Session - 02/21/19 1656    Visit Number  3    Number of Visits  9    Date for PT Re-Evaluation  03/14/19    PT Start Time  1602    PT Stop Time  1648    PT Time Calculation (min)  46 min    Activity Tolerance  Patient tolerated treatment well    Behavior During Therapy  [facility] for tasks assessed/performed       Past Medical History:  Diagnosis Date  . Breast cancer ([place])   . Family history of breast cancer   . Family history of lung cancer   . Family history of rectal cancer   . Melanoma ([place])   . PONV (postoperative nausea and vomiting)     Past Surgical History:  Procedure Laterality Date  . BREAST RECONSTRUCTION WITH PLACEMENT OF TISSUE EXPANDER AND ALLODERM Bilateral 01/17/2019   Procedure: BILATERAL BREAST RECONSTRUCTION WITH PLACEMENT OF TISSUE EXPANDER AND ALLODERM;  Surgeon: Irene Limbo, MD;  Location: [place];  Service: Plastics;  Laterality: Bilateral;  . melanoma surgery Right 2009   upper right arm  . NIPPLE SPARING MASTECTOMY WITH SENTINEL LYMPH NODE BIOPSY Bilateral 01/17/2019   Procedure: BILATERAL NIPPLE SPARING MASTECTOMIES WITH LEFT SENTINEL LYMPH NODE MAPPING;  Surgeon: Erroll Luna, MD;  Location: [place];  Service: General;  Laterality: Bilateral;  . WISDOM TOOTH EXTRACTION      There were no vitals filed for this visit.  Subjective Assessment - 02/21/19 1619    Subjective  I keep having that spasm that is in the middle of my chest and sightly towards the right.    Pertinent History  L breast cancer, DCIS and ER+, bilateral mastectomy 01/17/19, immediate reconstruction, SLNB 4 nodes  all negative, melanoma in upper right arm in 2009 stage 2    Patient Stated Goals  get range of motion back, ease back in to work outs and exercise and knowing what is ok to do    Currently in Pain?  Yes    Pain Score  6     Pain Location  Chest    Pain Orientation  Medial;Right    Pain Descriptors / Indicators  Cramping;Sharp    Pain Type  Surgical pain    Pain Onset  In the past 7 days         [facility] PT Assessment - 02/21/19 0001      AROM   Right Shoulder Flexion  155 Degrees    Right Shoulder ABduction  171 Degrees    Left Shoulder Flexion  159 Degrees    Left Shoulder ABduction  170 Degrees                   [facility] Adult PT Treatment/Exercise - 02/21/19 0001      Shoulder Exercises: Supine   Horizontal ABduction  Strengthening;Both;10 reps;Theraband   pt returned therapist demo   Theraband Level (Shoulder Horizontal ABduction)  Level 2 (Red)    External Rotation  Strengthening;Both;10 reps;Theraband   pt returned therapist demo   Theraband Level (Shoulder External Rotation)  Level 2 (Red)    Flexion  Strengthening;Both;10 reps;Theraband   narrow and  wide grip, pt returned therapist demo   Theraband Level (Shoulder Flexion)  Level 2 (Red)    Diagonals  Strengthening;Both;10 reps;Theraband   pt returned therapist demo   Theraband Level (Shoulder Diagonals)  Level 2 (Red)      Shoulder Exercises: Pulleys   Flexion  2 minutes    ABduction  2 minutes      Shoulder Exercises: Therapy Ball   Flexion  10 reps   with stretch at end range   ABduction  Both;10 reps   with stretch at end range     Manual Therapy   Passive ROM  to left shoulder mainly in to flexion where pt is still limited but also in to abduction                  PT Long Term Goals - 02/14/19 1550      PT LONG TERM GOAL #1   Title  Pt will demonstrate 160 degrees of bilateral shoulder flexion to allow her to reach up in to high cabinets at home.    Baseline  R 145, L 156     Time  4    Period  Weeks    Status  New    Target Date  03/14/19      PT LONG TERM GOAL #2   Title  Pt will demonstrate 160 degrees of bilateral shoulder abduction to allow her to reach out to the side.    Baseline  R 140, L 140    Time  4    Period  Weeks    Status  New    Target Date  03/14/19      PT LONG TERM GOAL #3   Title  Pt will be independent in a home exercise program for continued strengtheing and stretching.    Time  4    Period  Weeks    Status  New    Target Date  03/14/19      PT LONG TERM GOAL #4   Title  Pt will receive appropriate prophylactic sleeve and gauntlet to wear when she flies in March to decrease risk of lymphedema.    Time  4    Period  Weeks    Status  New    Target Date  03/14/19      PT LONG TERM GOAL #5   Title  Pt will be able to sleep comfortably at night and be able to go from lying down to sitting without increase pain.    Time  4    Period  Weeks    Status  New    Target Date  03/14/19            Plan - 02/21/19 1657    Clinical Impression Statement  Reassessed pt's ROM and her ROM has improved greatly since time of evaluation. She is still limited with left shoulder flexion but this has improved. Continued with AAROM exercises and added supine scapular strengthening exercises to pt's HEP and had her return demonstrate. Ended session with PROM to increase left shoulder flexion. Pt reports she has been having a spasm pain more often in her chest towards the middle slightly to the right that has worsened over the last 2 days. She did not have any pain with this while lying down but did when walking in to her appt. Encouraged pt to follow up with her doctor regarding this.    PT Frequency  2x / week  PT Duration  4 weeks    PT Treatment/Interventions  ADLs/Self Care Home Management;Therapeutic exercise;Therapeutic activities;Manual techniques;Passive range of motion;Scar mobilization;Taping    PT Next Visit Plan  assess indep with  supine scap and instruct in Strength ABC, scar mobs when healed    PT Home Exercise Plan  supine dowel shoulder, post op breast, supine scap    Consulted and Agree with Plan of Care  Patient       Patient will benefit from skilled therapeutic intervention in order to improve the following deficits and impairments:  Pain, Postural dysfunction, Impaired UE functional use, Increased fascial restricitons, Decreased strength, Decreased knowledge of precautions, Decreased range of motion, Increased muscle spasms  Visit Diagnosis: Stiffness of left shoulder, not elsewhere classified  Stiffness of right shoulder, not elsewhere classified  Aftercare following surgery for neoplasm  Muscle weakness (generalized)     Problem List Patient Active Problem List   Diagnosis Date Noted  . Family history of breast cancer   . Family history of rectal cancer   . Family history of lung cancer   . 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 Genetic testing 11/03/2018  . Malignant neoplasm of upper-outer quadrant of left breast in female, estrogen receptor positive ([place]) 10/20/2018  . Hip pain, acute, left 07/30/2016  . Neck pain 08/10/2011  . Musculoskeletal pain 08/10/2011  . KNEE PAIN, RIGHT 01/30/2010  . 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 UNEQUAL LEG LENGTH 01/30/2010  . ABNORMALITY OF GAIT 01/30/2010    Allyson Sabal [facility] 02/21/2019, 5:00 PM  [place], [place], [address] Phone: [phone]   Fax:  [phone]  Name: Stephanie [name] MRN: [id number] Date of Birth: [birth year]  Allyson Sabal [place], PT 02/21/19 5:00 PM

## 2019-02-21 NOTE — Patient Instructions (Signed)
Over Head Pull: Narrow and Wide Grip   Cancer Rehab [phone]   On back, knees bent, feet flat, band across thighs, elbows straight but relaxed. Pull hands apart (start). Keeping elbows straight, bring arms up and over head, hands toward floor. Keep pull steady on band. Hold momentarily. Return slowly, keeping pull steady, back to start. Then do same with a wider grip on the band (past shoulder width) Repeat _10__ times. Band color __red___   Side Pull: Double Arm   On back, knees bent, feet flat. Arms perpendicular to body, shoulder level, elbows straight but relaxed. Pull arms out to sides, elbows straight. Resistance band comes across collarbones, hands toward floor. Hold momentarily. Slowly return to starting position. Repeat _10__ times. Band color _red___   Sword   On back, knees bent, feet flat, left hand on left hip, right hand above left. Pull right arm DIAGONALLY (hip to shoulder) across chest. Bring right arm along head toward floor. Hold momentarily. Thumb is down near opposite hip and rotates upwards when by head. Slowly return to starting position. Repeat _10__ times. Do with left arm. Band color _red_____   Shoulder Rotation: Double Arm   On back, knees bent, feet flat, elbows tucked at sides, bent 90, hands palms up. Pull hands apart and down toward floor, keeping elbows near sides. Hold momentarily. Slowly return to starting position. Repeat _10__ times. Band color __red____

## 2019-02-23 ENCOUNTER — Ambulatory Visit: Payer: BC Managed Care – PPO | Admitting: Physical Therapy

## 2019-02-23 ENCOUNTER — Encounter: Payer: Self-pay | Admitting: Physical Therapy

## 2019-02-23 ENCOUNTER — Other Ambulatory Visit: Payer: Self-pay

## 2019-02-23 DIAGNOSIS — M6281 Muscle weakness (generalized): Secondary | ICD-10-CM

## 2019-02-23 DIAGNOSIS — M25611 Stiffness of right shoulder, not elsewhere classified: Secondary | ICD-10-CM | POA: Diagnosis not present

## 2019-02-23 DIAGNOSIS — Z483 Aftercare following surgery for neoplasm: Secondary | ICD-10-CM | POA: Diagnosis not present

## 2019-02-23 DIAGNOSIS — M25612 Stiffness of left shoulder, not elsewhere classified: Secondary | ICD-10-CM

## 2019-02-23 NOTE — Therapy (Signed)
[place], [place], [address] Phone: [phone]   Fax:  [phone]  Physical Therapy Treatment  Patient Details  Name: Stephanie [name] MRN: [id number] Date of Birth: 25-Apr-1985 Referring Provider (PT): Dr. Vinie Sill   Encounter Date: 02/23/2019  PT End of Session - 02/23/19 0947    Visit Number  4    Number of Visits  9    Date for PT Re-Evaluation  03/14/19    PT Start Time  0906    PT Stop Time  0945    PT Time Calculation (min)  39 min    Activity Tolerance  Patient tolerated treatment well    Behavior During Therapy  [facility] for tasks assessed/performed       Past Medical History:  Diagnosis Date  . Breast cancer ([place])   . Family history of breast cancer   . Family history of lung cancer   . Family history of rectal cancer   . Melanoma ([place])   . PONV (postoperative nausea and vomiting)     Past Surgical History:  Procedure Laterality Date  . BREAST RECONSTRUCTION WITH PLACEMENT OF TISSUE EXPANDER AND ALLODERM Bilateral 01/17/2019   Procedure: BILATERAL BREAST RECONSTRUCTION WITH PLACEMENT OF TISSUE EXPANDER AND ALLODERM;  Surgeon: Irene Limbo, MD;  Location: [place];  Service: Plastics;  Laterality: Bilateral;  . melanoma surgery Right 2009   upper right arm  . NIPPLE SPARING MASTECTOMY WITH SENTINEL LYMPH NODE BIOPSY Bilateral 01/17/2019   Procedure: BILATERAL NIPPLE SPARING MASTECTOMIES WITH LEFT SENTINEL LYMPH NODE MAPPING;  Surgeon: Erroll Luna, MD;  Location: [place];  Service: General;  Laterality: Bilateral;  . WISDOM TOOTH EXTRACTION      There were no vitals filed for this visit.  Subjective Assessment - 02/23/19 0908    Subjective  I had some soreness after last time from the stretching. It was not pain just soreness.    Pertinent History  L breast cancer, DCIS and ER+, bilateral mastectomy 01/17/19, immediate reconstruction, SLNB 4 nodes  all negative, melanoma in upper right arm in 2009 stage 2    Patient Stated Goals  get range of motion back, ease back in to work outs and exercise and knowing what is ok to do    Currently in Pain?  No/denies    Pain Score  0-No pain         [facility] PT Assessment - 02/23/19 0001      AROM   Right Shoulder Flexion  153 Degrees    Left Shoulder Flexion  158 Degrees                   [facility] Adult PT Treatment/Exercise - 02/23/19 0001      Exercises   Other Exercises   Instructed pt in entire Strength ABC program- had pt hold all stretches for 30 seconds and return demonstrate all exercises x 10 reps using 2lb dumbells. Educated pt on proper way to progress exercises. Pt required very little verbal cues to do exercises correctly.                   PT Long Term Goals - 02/14/19 1550      PT LONG TERM GOAL #1   Title  Pt will demonstrate 160 degrees of bilateral shoulder flexion to allow her to reach up in to high cabinets at home.    Baseline  R 145, L 156    Time  4    Period  Weeks    Status  New    Target Date  03/14/19      PT LONG TERM GOAL #2   Title  Pt will demonstrate 160 degrees of bilateral shoulder abduction to allow her to reach out to the side.    Baseline  R 140, L 140    Time  4    Period  Weeks    Status  New    Target Date  03/14/19      PT LONG TERM GOAL #3   Title  Pt will be independent in a home exercise program for continued strengtheing and stretching.    Time  4    Period  Weeks    Status  New    Target Date  03/14/19      PT LONG TERM GOAL #4   Title  Pt will receive appropriate prophylactic sleeve and gauntlet to wear when she flies in March to decrease risk of lymphedema.    Time  4    Period  Weeks    Status  New    Target Date  03/14/19      PT LONG TERM GOAL #5   Title  Pt will be able to sleep comfortably at night and be able to go from lying down to sitting without increase pain.    Time  4    Period  Weeks     Status  New    Target Date  03/14/19            Plan - 02/23/19 0947    Clinical Impression Statement  Instructed pt in entire Strength ABC program and had pt return demonstrate all stretches and exercises. Issued this as part of pt's HEP. She did have some soreness after last session in the left shoulder from stretching. Remeasured ROM at end of session today and it as similar to last visit. Did not do any PROM today due to soreness in left shoulder and pt had just completed entire STrength ABC program.    PT Frequency  2x / week    PT Duration  4 weeks    PT Treatment/Interventions  ADLs/Self Care Home Management;Therapeutic exercise;Therapeutic activities;Manual techniques;Passive range of motion;Scar mobilization;Taping    PT Next Visit Plan  assess indep with Strength ABC, scar mobs when healed, remeasure ROM and assess goals    PT Home Exercise Plan  supine dowel shoulder, post op breast, supine scap, Strength ABC    Consulted and Agree with Plan of Care  Patient       Patient will benefit from skilled therapeutic intervention in order to improve the following deficits and impairments:  Pain, Postural dysfunction, Impaired UE functional use, Increased fascial restricitons, Decreased strength, Decreased knowledge of precautions, Decreased range of motion, Increased muscle spasms  Visit Diagnosis: Stiffness of left shoulder, not elsewhere classified  Stiffness of right shoulder, not elsewhere classified  Aftercare following surgery for neoplasm  Muscle weakness (generalized)     Problem List Patient Active Problem List   Diagnosis Date Noted  . Family history of breast cancer   . Family history of rectal cancer   . Family history of lung cancer   . 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 Genetic testing 11/03/2018  . Malignant neoplasm of upper-outer quadrant of left breast in female, estrogen receptor positive ([place]) 10/20/2018  . Hip pain, acute,  left 07/30/2016  . Neck pain 08/10/2011  .  Musculoskeletal pain 08/10/2011  . KNEE PAIN, RIGHT 01/30/2010  . 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 UNEQUAL LEG LENGTH 01/30/2010  . ABNORMALITY OF GAIT 01/30/2010    Allyson Sabal [facility] 02/23/2019, 9:50 AM  [place], [place], [address] Phone: [phone]   Fax:  [phone]  Name: Stephanie [name] MRN: [id number] Date of Birth: May 29, 1985  Allyson Sabal [place], PT 02/23/19 9:50 AM

## 2019-02-28 ENCOUNTER — Encounter: Payer: Self-pay | Admitting: Physical Therapy

## 2019-02-28 ENCOUNTER — Other Ambulatory Visit: Payer: Self-pay

## 2019-02-28 ENCOUNTER — Ambulatory Visit: Payer: BC Managed Care – PPO | Admitting: Physical Therapy

## 2019-02-28 DIAGNOSIS — M6281 Muscle weakness (generalized): Secondary | ICD-10-CM | POA: Diagnosis not present

## 2019-02-28 DIAGNOSIS — Z483 Aftercare following surgery for neoplasm: Secondary | ICD-10-CM

## 2019-02-28 DIAGNOSIS — M25612 Stiffness of left shoulder, not elsewhere classified: Secondary | ICD-10-CM | POA: Diagnosis not present

## 2019-02-28 DIAGNOSIS — M25611 Stiffness of right shoulder, not elsewhere classified: Secondary | ICD-10-CM | POA: Diagnosis not present

## 2019-02-28 NOTE — Therapy (Signed)
[place], [place], [address] Phone: [phone]   Fax:  [phone]  Physical Therapy Treatment  Patient Details  Name: Stephanie [name] MRN: [id number] Date of Birth: Sep 13, 1985 Referring Provider (PT): Dr. Vinie Sill   Encounter Date: 02/28/2019  PT End of Session - 02/28/19 0852    Visit Number  5    Number of Visits  9    Date for PT Re-Evaluation  03/14/19    PT Start Time  0805    PT Stop Time  0845    PT Time Calculation (min)  40 min    Activity Tolerance  Patient tolerated treatment well    Behavior During Therapy  [facility] for tasks assessed/performed       Past Medical History:  Diagnosis Date  . Breast cancer ([place])   . Family history of breast cancer   . Family history of lung cancer   . Family history of rectal cancer   . Melanoma ([place])   . PONV (postoperative nausea and vomiting)     Past Surgical History:  Procedure Laterality Date  . BREAST RECONSTRUCTION WITH PLACEMENT OF TISSUE EXPANDER AND ALLODERM Bilateral 01/17/2019   Procedure: BILATERAL BREAST RECONSTRUCTION WITH PLACEMENT OF TISSUE EXPANDER AND ALLODERM;  Surgeon: Irene Limbo, MD;  Location: [place];  Service: Plastics;  Laterality: Bilateral;  . melanoma surgery Right 2009   upper right arm  . NIPPLE SPARING MASTECTOMY WITH SENTINEL LYMPH NODE BIOPSY Bilateral 01/17/2019   Procedure: BILATERAL NIPPLE SPARING MASTECTOMIES WITH LEFT SENTINEL LYMPH NODE MAPPING;  Surgeon: Erroll Luna, MD;  Location: [place];  Service: General;  Laterality: Bilateral;  . WISDOM TOOTH EXTRACTION      There were no vitals filed for this visit.  Subjective Assessment - 02/28/19 0806    Subjective  I did good with the new exercises. I have not really been very sore.    Pertinent History  L breast cancer, DCIS and ER+, bilateral mastectomy 01/17/19, immediate reconstruction, SLNB 4 nodes all negative,  melanoma in upper right arm in 2009 stage 2    Patient Stated Goals  get range of motion back, ease back in to work outs and exercise and knowing what is ok to do    Currently in Pain?  No/denies    Pain Score  0-No pain                       [facility] Adult PT Treatment/Exercise - 02/28/19 0001      Manual Therapy   Manual Therapy  Soft tissue mobilization;Myofascial release    Soft tissue mobilization  instructed pt throughout: along bilateral mastectomy scars and left SLNB scar- lateral edges of scars demonstrated more scar tissue but this eased with STM and pt felt less pulling in these areas at end range shoulder motion    Myofascial Release  to bilateral mastectomy scars                  PT Long Term Goals - 02/28/19 0806      PT LONG TERM GOAL #1   Title  Pt will demonstrate 160 degrees of bilateral shoulder flexion to allow her to reach up in to high cabinets at home.    Baseline  R 145, L 156, 02/28/19- R 166, L 165    Time  4    Period  Weeks    Status  Achieved  PT LONG TERM GOAL #2   Title  Pt will demonstrate 160 degrees of bilateral shoulder abduction to allow her to reach out to the side.    Baseline  R 140, L 140, 02/28/19- R 176 L 176    Time  4    Period  Weeks    Status  Achieved      PT LONG TERM GOAL #3   Title  Pt will be independent in a home exercise program for continued strengtheing and stretching.    Baseline  02/28/19- pt is independent in her home exercise program    Status  Achieved      PT LONG TERM GOAL #4   Title  Pt will receive appropriate prophylactic sleeve and gauntlet to wear when she flies in March to decrease risk of lymphedema.    Baseline  02/28/19- sleeve and glove has been ordered    Time  4    Period  Weeks    Status  Achieved      PT LONG TERM GOAL #5   Title  Pt will be able to sleep comfortably at night and be able to go from lying down to sitting without increase pain.    Baseline  02/28/19- pt is  able to do this without pain now, pt is able to sleep better now    Time  4    Period  Weeks    Status  Achieved            Plan - 02/28/19 0853    Clinical Impression Statement  Assessed pt's progress towards goals in therapy. Pt has now met all goals for therapy. Spent the session today working on scar mobilization and instructing pt in this so she can continue this at home. She reports independence at end of session. She is also independent with her home exercise program and has regained full bilateral shoulder ROM. She will be discharged from skilled PT services at this time.    PT Frequency  2x / week    PT Duration  4 weeks    PT Treatment/Interventions  ADLs/Self Care Home Management;Therapeutic exercise;Therapeutic activities;Manual techniques;Passive range of motion;Scar mobilization;Taping    PT Next Visit Plan  d/c this visit    PT Home Exercise Plan  supine dowel shoulder, post op breast, supine scap, Strength ABC    Consulted and Agree with Plan of Care  Patient       Patient will benefit from skilled therapeutic intervention in order to improve the following deficits and impairments:  Pain, Postural dysfunction, Impaired UE functional use, Increased fascial restricitons, Decreased strength, Decreased knowledge of precautions, Decreased range of motion, Increased muscle spasms  Visit Diagnosis: Aftercare following surgery for neoplasm     Problem List Patient Active Problem List   Diagnosis Date Noted  . Family history of breast cancer   . Family history of rectal cancer   . Family history of lung cancer   . 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 Genetic testing 11/03/2018  . Malignant neoplasm of upper-outer quadrant of left breast in female, estrogen receptor positive ([place]) 10/20/2018  . Hip pain, acute, left 07/30/2016  . Neck pain 08/10/2011  . Musculoskeletal pain 08/10/2011  . KNEE PAIN, RIGHT 01/30/2010  . ITBS, RIGHT KNEE  01/30/2010  . UNEQUAL LEG LENGTH 01/30/2010  . ABNORMALITY OF GAIT 01/30/2010    Allyson Sabal [facility] 02/28/2019, 8:57 AM  [place]  [place], [place], [address] Phone: [phone]   Fax:  [phone]  Name: Arisbeth Mahari Vankirk MRN: [id number] Date of Birth: Mar 25, 1985  Allyson Sabal [place], PT 02/28/19 8:57 AM  PHYSICAL THERAPY DISCHARGE SUMMARY  Visits from Start of Care: 5  Current functional level related to goals / functional outcomes: All goals met   Remaining deficits: None   Education / Equipment: HEP  Plan: Patient agrees to discharge.  Patient goals were met. Patient is being discharged due to meeting the stated rehab goals.  ?????    Allyson Sabal [place], [place] 02/28/19 8:57 AM

## 2019-03-01 ENCOUNTER — Ambulatory Visit: Payer: BC Managed Care – PPO

## 2019-03-02 ENCOUNTER — Encounter: Payer: BC Managed Care – PPO | Admitting: Physical Therapy

## 2019-03-07 ENCOUNTER — Other Ambulatory Visit: Payer: Self-pay

## 2019-03-07 ENCOUNTER — Ambulatory Visit: Payer: BC Managed Care – PPO | Attending: Internal Medicine

## 2019-03-07 DIAGNOSIS — Z20822 Contact with and (suspected) exposure to covid-19: Secondary | ICD-10-CM | POA: Diagnosis not present

## 2019-03-08 LAB — NOVEL CORONAVIRUS, NAA: SARS-CoV-2, NAA: NOT DETECTED

## 2019-03-14 ENCOUNTER — Other Ambulatory Visit: Payer: Self-pay | Admitting: Physician Assistant

## 2019-03-14 DIAGNOSIS — Z85820 Personal history of malignant melanoma of skin: Secondary | ICD-10-CM | POA: Diagnosis not present

## 2019-03-14 DIAGNOSIS — D229 Melanocytic nevi, unspecified: Secondary | ICD-10-CM

## 2019-03-14 DIAGNOSIS — D485 Neoplasm of uncertain behavior of skin: Secondary | ICD-10-CM | POA: Diagnosis not present

## 2019-03-14 HISTORY — DX: Melanocytic nevi, unspecified: D22.9

## 2019-03-22 DIAGNOSIS — C50919 Malignant neoplasm of unspecified site of unspecified female breast: Secondary | ICD-10-CM | POA: Diagnosis not present

## 2019-04-06 ENCOUNTER — Telehealth: Payer: Self-pay | Admitting: Physician Assistant

## 2019-04-06 NOTE — Telephone Encounter (Signed)
I called patient to R/S her W/S on June 3rd because Vida Roller will be out of office. She informed me that she just wanted to cancel the appointment because her plastic surgeon is going to do the w/s during a upcoming procedure. She seen her plastic surgeon on 3/10 [facility] plastics) their notes are in chart review. FYI

## 2019-04-17 ENCOUNTER — Ambulatory Visit: Payer: BC Managed Care – PPO | Admitting: Oncology

## 2019-05-10 DIAGNOSIS — D0512 Intraductal carcinoma in situ of left breast: Secondary | ICD-10-CM | POA: Diagnosis not present

## 2019-05-10 DIAGNOSIS — Z803 Family history of malignant neoplasm of breast: Secondary | ICD-10-CM | POA: Diagnosis not present

## 2019-05-10 DIAGNOSIS — Z9013 Acquired absence of bilateral breasts and nipples: Secondary | ICD-10-CM | POA: Diagnosis not present

## 2019-05-10 NOTE — H&P (Signed)
Subjective:     Patient ID: Stephanie [name] is a 34 y.o. female.  HPI  3.5 months post op. Scheduled for next stage breast reconstruction end May 2021. 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   Presented following screening MMG with calcifications left breast. Diagnostic MG showed left breast UOQ calcifications spanning 2 cm. Biopsy demonstrated intermediate grade DCIS, ER+/PR-.  MRI showed biopsy-proven left breast DCIS reflected by clumped NME in the upper outer left breast spanning 2.6 x 2.3 x 2.5 cm.  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  Genetics negative. Multiple members of family with breast ca.  Prior 36 B, happy with this. Right mastectomy 365 g Left 437 g  PMH significant for melanoma- right arm, followed by [facility] yearly for 10 years.  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 Lives with husband, no children.  Review of Systems     Objective:   Physical Exam  Cardiovascular: Normal rate, regular rhythm and normal heart sounds.  Pulmonary/Chest: Effort normal and breath sounds normal.  Abdominal: Soft.   Chest:  Expanded bilateral bilateral chest rippling superior pole SN to nipple R 24.5 L 24cm Nipple to IMF R 7 L 6 cm CW 14  Left chest lateral to IMF scar 5 mm   Assessment:     Left breast ca DCIS ER+ Family history breast ca S/p bilateral NSM, left SLN prepectoral TE/ADM (Alloderm) reconstruction    Plan:     Plan removal bilateral TE, placement implants, lipofilling chest, excision left chest skin lesion.  Reviewed saline vs silicone, shaped vs round. HCG or capacity filled silicone implants may offer reduced risk visible rippling. Reviewed MRI or USsurveillance for rupture with silicone implants. Reviewed  examples for 4th generation, capacity filled 4th generation, and HCG implants vs saline implants. Reviewed risks AP flipping that may be more noticeable with 5th generation implants, may require surgery to correct.Reviewed textured vs smooth, former associated with ALCL risk.Reviewed size in part guided by width chest, cannot assure her cup size. Plan silicone round, capacity filled.  Reviewed possible fat grafting. Reviewed purpose of this to thicken flaps limit visible ripplingand aid in contour. Reviewed donor site pain, need for compression, variable take graft, fat necrosis that presents as masses and may require work up. Desires to proceed with this. Plan abdomen donor site- plan flanks and possible supra and infraumbilical abdomen. Recommend she purchase Spanx type garment for post op use.  Additional risks including but not limited to bleeding hematoma seroma asymmetry damage to deeper structures need for additional surgery blood clots in legs or lungs reviewed.  Plan OP surgery, no drains anticipated.  Plans to receive COVID vaccine following surgery. Reviewed at this time she still will be scheduled for COVID test prior to surgery.  Completed ASPS implant placement consent with checklist.   Rx for Bactrim Robaxin and tramadol given.  Natrelle 133S FV-11 -T 300 ml tissue expanders placed bilateral,  fill volume 300 ml saline bilateral

## 2019-05-23 ENCOUNTER — Other Ambulatory Visit: Payer: Self-pay

## 2019-05-26 ENCOUNTER — Other Ambulatory Visit: Payer: Self-pay

## 2019-05-26 ENCOUNTER — Encounter (HOSPITAL_BASED_OUTPATIENT_CLINIC_OR_DEPARTMENT_OTHER): Payer: Self-pay | Admitting: Plastic Surgery

## 2019-05-30 ENCOUNTER — Other Ambulatory Visit (HOSPITAL_COMMUNITY)
Admission: RE | Admit: 2019-05-30 | Discharge: 2019-05-30 | Disposition: A | Discharge status: Home / Self Care | Payer: BC Managed Care – PPO | Source: Ambulatory Visit | Attending: Plastic Surgery | Admitting: Plastic Surgery

## 2019-05-30 DIAGNOSIS — Z20822 Contact with and (suspected) exposure to covid-19: Secondary | ICD-10-CM | POA: Insufficient documentation

## 2019-05-30 DIAGNOSIS — Z01812 Encounter for preprocedural laboratory examination: Secondary | ICD-10-CM | POA: Insufficient documentation

## 2019-05-30 LAB — SARS CORONAVIRUS 2 (TAT 6-24 HRS): SARS Coronavirus 2: NEGATIVE

## 2019-05-30 NOTE — Progress Notes (Signed)

## 2019-06-02 ENCOUNTER — Ambulatory Visit (HOSPITAL_BASED_OUTPATIENT_CLINIC_OR_DEPARTMENT_OTHER)
Admission: RE | Admit: 2019-06-02 | Discharge: 2019-06-02 | Disposition: A | Discharge status: Home / Self Care | Payer: BC Managed Care – PPO | Attending: Plastic Surgery | Admitting: Plastic Surgery

## 2019-06-02 ENCOUNTER — Encounter (HOSPITAL_BASED_OUTPATIENT_CLINIC_OR_DEPARTMENT_OTHER)
Admission: RE | Disposition: A | Discharge status: Home / Self Care | Payer: Self-pay | Source: Home / Self Care | Attending: Plastic Surgery

## 2019-06-02 ENCOUNTER — Encounter (HOSPITAL_BASED_OUTPATIENT_CLINIC_OR_DEPARTMENT_OTHER): Payer: Self-pay | Admitting: Plastic Surgery

## 2019-06-02 ENCOUNTER — Ambulatory Visit (HOSPITAL_BASED_OUTPATIENT_CLINIC_OR_DEPARTMENT_OTHER): Payer: BC Managed Care – PPO | Admitting: Anesthesiology

## 2019-06-02 ENCOUNTER — Other Ambulatory Visit: Payer: Self-pay

## 2019-06-02 DIAGNOSIS — Z803 Family history of malignant neoplasm of breast: Secondary | ICD-10-CM | POA: Insufficient documentation

## 2019-06-02 DIAGNOSIS — Z85820 Personal history of malignant melanoma of skin: Secondary | ICD-10-CM | POA: Insufficient documentation

## 2019-06-02 DIAGNOSIS — Z9011 Acquired absence of right breast and nipple: Secondary | ICD-10-CM | POA: Diagnosis not present

## 2019-06-02 DIAGNOSIS — D229 Melanocytic nevi, unspecified: Secondary | ICD-10-CM | POA: Insufficient documentation

## 2019-06-02 DIAGNOSIS — Z421 Encounter for breast reconstruction following mastectomy: Secondary | ICD-10-CM | POA: Insufficient documentation

## 2019-06-02 DIAGNOSIS — D225 Melanocytic nevi of trunk: Secondary | ICD-10-CM | POA: Diagnosis not present

## 2019-06-02 DIAGNOSIS — Z853 Personal history of malignant neoplasm of breast: Secondary | ICD-10-CM | POA: Diagnosis not present

## 2019-06-02 DIAGNOSIS — D235 Other benign neoplasm of skin of trunk: Secondary | ICD-10-CM | POA: Diagnosis not present

## 2019-06-02 DIAGNOSIS — Z9013 Acquired absence of bilateral breasts and nipples: Secondary | ICD-10-CM | POA: Diagnosis not present

## 2019-06-02 DIAGNOSIS — L905 Scar conditions and fibrosis of skin: Secondary | ICD-10-CM | POA: Diagnosis not present

## 2019-06-02 HISTORY — PX: LIPOSUCTION WITH LIPOFILLING: SHX6436

## 2019-06-02 HISTORY — PX: LESION REMOVAL: SHX5196

## 2019-06-02 HISTORY — PX: REMOVAL OF BILATERAL TISSUE EXPANDERS WITH PLACEMENT OF BILATERAL BREAST IMPLANTS: SHX6431

## 2019-06-02 LAB — POCT PREGNANCY, URINE: Preg Test, Ur: NEGATIVE

## 2019-06-02 SURGERY — REMOVAL, TISSUE EXPANDER, BREAST, BILATERAL, WITH BILATERAL IMPLANT IMPLANT INSERTION
Anesthesia: General | Site: Chest | Laterality: Left

## 2019-06-02 MED ORDER — MEPERIDINE HCL 25 MG/ML IJ SOLN: 6.2500 mg | INTRAMUSCULAR | Status: DC | PRN

## 2019-06-02 MED ORDER — PROPOFOL 500 MG/50ML IV EMUL
INTRAVENOUS | Status: AC
  Filled 2019-06-02: qty 50

## 2019-06-02 MED ORDER — ROCURONIUM BROMIDE 100 MG/10ML IV SOLN
INTRAVENOUS | Status: DC | PRN
Start: 2019-06-02 — End: 2019-06-02
  Administered 2019-06-02: 50 mg via INTRAVENOUS
  Administered 2019-06-02: 20 mg via INTRAVENOUS

## 2019-06-02 MED ORDER — FENTANYL CITRATE (PF) 100 MCG/2ML IJ SOLN
INTRAMUSCULAR | Status: AC
  Filled 2019-06-02: qty 2

## 2019-06-02 MED ORDER — SCOPOLAMINE 1 MG/3DAYS TD PT72
1.0000 | MEDICATED_PATCH | TRANSDERMAL | Status: DC
  Administered 2019-06-02: 1.5 mg via TRANSDERMAL

## 2019-06-02 MED ORDER — HYDROMORPHONE HCL 1 MG/ML IJ SOLN: 0.2500 mg | INTRAMUSCULAR | Status: DC | PRN

## 2019-06-02 MED ORDER — CEFAZOLIN SODIUM-DEXTROSE 2-4 GM/100ML-% IV SOLN
2.0000 g | INTRAVENOUS | Status: AC
  Administered 2019-06-02: 2 g via INTRAVENOUS

## 2019-06-02 MED ORDER — FENTANYL CITRATE (PF) 100 MCG/2ML IJ SOLN
INTRAMUSCULAR | Status: DC | PRN
  Administered 2019-06-02 (×3): 25 ug via INTRAVENOUS
  Administered 2019-06-02: 100 ug via INTRAVENOUS

## 2019-06-02 MED ORDER — DEXAMETHASONE SODIUM PHOSPHATE 4 MG/ML IJ SOLN
INTRAMUSCULAR | Status: DC | PRN
  Administered 2019-06-02: 10 mg via INTRAVENOUS

## 2019-06-02 MED ORDER — METOPROLOL TARTRATE 5 MG/5ML IV SOLN
INTRAVENOUS | Status: AC
  Filled 2019-06-02: qty 5

## 2019-06-02 MED ORDER — SODIUM BICARBONATE 4 % IV SOLN
INTRAVENOUS | Status: DC | PRN
  Administered 2019-06-02: 400 mL via INTRAMUSCULAR

## 2019-06-02 MED ORDER — EPHEDRINE 5 MG/ML INJ
INTRAVENOUS | Status: AC
  Filled 2019-06-02: qty 10

## 2019-06-02 MED ORDER — SODIUM CHLORIDE 0.9 % IV SOLN
INTRAVENOUS | Status: DC | PRN
  Administered 2019-06-02: 1000 mL

## 2019-06-02 MED ORDER — GLYCOPYRROLATE 0.2 MG/ML IJ SOLN
INTRAMUSCULAR | Status: DC | PRN
  Administered 2019-06-02: .1 mg via INTRAVENOUS

## 2019-06-02 MED ORDER — SCOPOLAMINE 1 MG/3DAYS TD PT72
MEDICATED_PATCH | TRANSDERMAL | Status: AC
  Filled 2019-06-02: qty 1

## 2019-06-02 MED ORDER — PHENYLEPHRINE HCL-NACL 10-0.9 MG/250ML-% IV SOLN
INTRAVENOUS | Status: DC | PRN
Start: 2019-06-02 — End: 2019-06-02
  Administered 2019-06-02: 25 ug/min via INTRAVENOUS

## 2019-06-02 MED ORDER — GABAPENTIN 300 MG PO CAPS
300.0000 mg | ORAL_CAPSULE | ORAL | Status: AC
  Administered 2019-06-02: 300 mg via ORAL

## 2019-06-02 MED ORDER — SCOPOLAMINE 1 MG/3DAYS TD PT72
MEDICATED_PATCH | TRANSDERMAL | Status: DC | PRN
  Administered 2019-06-02: 1 via TRANSDERMAL

## 2019-06-02 MED ORDER — EPHEDRINE SULFATE 50 MG/ML IJ SOLN
INTRAMUSCULAR | Status: DC | PRN
  Administered 2019-06-02: 5 mg via INTRAVENOUS

## 2019-06-02 MED ORDER — CELECOXIB 200 MG PO CAPS
ORAL_CAPSULE | ORAL | Status: AC
  Filled 2019-06-02: qty 1

## 2019-06-02 MED ORDER — PHENYLEPHRINE 40 MCG/ML (10ML) SYRINGE FOR IV PUSH (FOR BLOOD PRESSURE SUPPORT)
PREFILLED_SYRINGE | INTRAVENOUS | Status: AC
  Filled 2019-06-02: qty 10

## 2019-06-02 MED ORDER — LACTATED RINGERS IV SOLN: INTRAVENOUS | Status: DC

## 2019-06-02 MED ORDER — LIDOCAINE HCL (CARDIAC) PF 100 MG/5ML IV SOSY
PREFILLED_SYRINGE | INTRAVENOUS | Status: DC | PRN
  Administered 2019-06-02: 75 mg via INTRAVENOUS

## 2019-06-02 MED ORDER — MIDAZOLAM HCL 2 MG/2ML IJ SOLN
INTRAMUSCULAR | Status: AC
  Filled 2019-06-02: qty 2

## 2019-06-02 MED ORDER — MIDAZOLAM HCL 5 MG/5ML IJ SOLN
INTRAMUSCULAR | Status: DC | PRN
  Administered 2019-06-02: 2 mg via INTRAVENOUS

## 2019-06-02 MED ORDER — ACETAMINOPHEN 500 MG PO TABS
1000.0000 mg | ORAL_TABLET | ORAL | Status: AC
  Administered 2019-06-02: 1000 mg via ORAL

## 2019-06-02 MED ORDER — GABAPENTIN 300 MG PO CAPS
ORAL_CAPSULE | ORAL | Status: AC
  Filled 2019-06-02: qty 1

## 2019-06-02 MED ORDER — PHENYLEPHRINE HCL (PRESSORS) 10 MG/ML IV SOLN
INTRAVENOUS | Status: AC
  Filled 2019-06-02: qty 1

## 2019-06-02 MED ORDER — SUGAMMADEX SODIUM 200 MG/2ML IV SOLN
INTRAVENOUS | Status: DC | PRN
Start: 2019-06-02 — End: 2019-06-02
  Administered 2019-06-02: 140 mg via INTRAVENOUS

## 2019-06-02 MED ORDER — PROPOFOL 500 MG/50ML IV EMUL
INTRAVENOUS | Status: DC | PRN
  Administered 2019-06-02: 25 ug/kg/min via INTRAVENOUS

## 2019-06-02 MED ORDER — ACETAMINOPHEN 500 MG PO TABS
ORAL_TABLET | ORAL | Status: AC
  Filled 2019-06-02: qty 2

## 2019-06-02 MED ORDER — ONDANSETRON HCL 4 MG/2ML IJ SOLN
INTRAMUSCULAR | Status: DC | PRN
  Administered 2019-06-02: 4 mg via INTRAVENOUS

## 2019-06-02 MED ORDER — MIDAZOLAM HCL 2 MG/2ML IJ SOLN: 1.0000 mg | INTRAMUSCULAR | Status: DC | PRN

## 2019-06-02 MED ORDER — FENTANYL CITRATE (PF) 100 MCG/2ML IJ SOLN: 50.0000 ug | INTRAMUSCULAR | Status: DC | PRN

## 2019-06-02 MED ORDER — PHENYLEPHRINE HCL (PRESSORS) 10 MG/ML IV SOLN
INTRAVENOUS | Status: DC | PRN
Start: 2019-06-02 — End: 2019-06-02
  Administered 2019-06-02 (×4): 80 ug via INTRAVENOUS

## 2019-06-02 MED ORDER — ESMOLOL HCL 100 MG/10ML IV SOLN
INTRAVENOUS | Status: AC
  Filled 2019-06-02: qty 10

## 2019-06-02 MED ORDER — CHLORHEXIDINE GLUCONATE CLOTH 2 % EX PADS: 6.0000 | MEDICATED_PAD | Freq: Once | CUTANEOUS | Status: DC

## 2019-06-02 MED ORDER — PROPOFOL 10 MG/ML IV BOLUS
INTRAVENOUS | Status: DC | PRN
  Administered 2019-06-02: 150 mg via INTRAVENOUS

## 2019-06-02 MED ORDER — PROMETHAZINE HCL 25 MG/ML IJ SOLN: 6.2500 mg | INTRAMUSCULAR | Status: DC | PRN

## 2019-06-02 MED ORDER — CEFAZOLIN SODIUM-DEXTROSE 2-4 GM/100ML-% IV SOLN
INTRAVENOUS | Status: AC
  Filled 2019-06-02: qty 100

## 2019-06-02 MED ORDER — GLYCOPYRROLATE PF 0.2 MG/ML IJ SOSY
PREFILLED_SYRINGE | INTRAMUSCULAR | Status: AC
  Filled 2019-06-02: qty 1

## 2019-06-02 MED ORDER — CELECOXIB 200 MG PO CAPS
200.0000 mg | ORAL_CAPSULE | ORAL | Status: AC
  Administered 2019-06-02: 200 mg via ORAL

## 2019-06-02 SURGICAL SUPPLY — 84 items
BAG DECANTER FOR FLEXI CONT (MISCELLANEOUS) ×5 IMPLANT
BINDER ABDOMINAL 10 UNV 27-48 (MISCELLANEOUS) ×5 IMPLANT
BINDER ABDOMINAL 12 SM 30-45 (SOFTGOODS) IMPLANT
BINDER BREAST 3XL (GAUZE/BANDAGES/DRESSINGS) IMPLANT
BINDER BREAST LRG (GAUZE/BANDAGES/DRESSINGS) IMPLANT
BINDER BREAST MEDIUM (GAUZE/BANDAGES/DRESSINGS) IMPLANT
BINDER BREAST XLRG (GAUZE/BANDAGES/DRESSINGS) ×5 IMPLANT
BINDER BREAST XXLRG (GAUZE/BANDAGES/DRESSINGS) IMPLANT
BLADE SURG 10 STRL SS (BLADE) ×15 IMPLANT
BLADE SURG 11 STRL SS (BLADE) ×10 IMPLANT
BNDG GAUZE ELAST 4 BULKY (GAUZE/BANDAGES/DRESSINGS) ×10 IMPLANT
CANISTER LIPO FAT HARVEST (MISCELLANEOUS) ×5 IMPLANT
CANISTER SUCT 1200ML W/VALVE (MISCELLANEOUS) ×5 IMPLANT
CHLORAPREP W/TINT 26 (MISCELLANEOUS) ×10 IMPLANT
COVER BACK TABLE 60X90IN (DRAPES) ×5 IMPLANT
COVER MAYO STAND STRL (DRAPES) ×10 IMPLANT
COVER WAND RF STERILE (DRAPES) IMPLANT
DECANTER SPIKE VIAL GLASS SM (MISCELLANEOUS) IMPLANT
DERMABOND ADVANCED (GAUZE/BANDAGES/DRESSINGS) ×4
DERMABOND ADVANCED .7 DNX12 (GAUZE/BANDAGES/DRESSINGS) ×6 IMPLANT
DRAIN CHANNEL 15F RND FF W/TCR (WOUND CARE) IMPLANT
DRAPE SURG 17X23 STRL (DRAPES) ×10 IMPLANT
DRAPE TOP ARMCOVERS (MISCELLANEOUS) ×5 IMPLANT
DRAPE U-SHAPE 76X120 STRL (DRAPES) ×5 IMPLANT
DRAPE UTILITY XL STRL (DRAPES) ×5 IMPLANT
DRSG PAD ABDOMINAL 8X10 ST (GAUZE/BANDAGES/DRESSINGS) ×10 IMPLANT
ELECT BLADE 4.0 EZ CLEAN MEGAD (MISCELLANEOUS) ×5
ELECT COATED BLADE 2.86 ST (ELECTRODE) ×5 IMPLANT
ELECT REM PT RETURN 9FT ADLT (ELECTROSURGICAL) ×5
ELECTRODE BLDE 4.0 EZ CLN MEGD (MISCELLANEOUS) ×3 IMPLANT
ELECTRODE REM PT RTRN 9FT ADLT (ELECTROSURGICAL) ×3 IMPLANT
EVACUATOR SILICONE 100CC (DRAIN) IMPLANT
EXTRACTOR CANIST REVOLVE STRL (CANNISTER) IMPLANT
GLOVE BIO SURGEON STRL SZ 6 (GLOVE) ×25 IMPLANT
GOWN STRL REUS W/ TWL LRG LVL3 (GOWN DISPOSABLE) ×6 IMPLANT
GOWN STRL REUS W/TWL LRG LVL3 (GOWN DISPOSABLE) ×10
IMPL BREAST P6: XFULL RND 445 (Breast) ×6 IMPLANT
IMPL BRST P6: XFULL RND 445CC (Breast) ×6 IMPLANT
IMPLANT BREAST GEL 445CC (Breast) ×10 IMPLANT
IV NS 500ML (IV SOLUTION)
IV NS 500ML BAXH (IV SOLUTION) IMPLANT
KIT FILL SYSTEM UNIVERSAL (SET/KITS/TRAYS/PACK) IMPLANT
LINER CANISTER 1000CC FLEX (MISCELLANEOUS) ×5 IMPLANT
MARKER SKIN DUAL TIP RULER LAB (MISCELLANEOUS) IMPLANT
NDL SAFETY ECLIPSE 18X1.5 (NEEDLE) ×3 IMPLANT
NEEDLE FILTER BLUNT 18X 1/2SAF (NEEDLE) ×2
NEEDLE FILTER BLUNT 18X1 1/2 (NEEDLE) ×3 IMPLANT
NEEDLE HYPO 18GX1.5 SHARP (NEEDLE) ×5
NEEDLE HYPO 25X1 1.5 SAFETY (NEEDLE) IMPLANT
NS IRRIG 1000ML POUR BTL (IV SOLUTION) IMPLANT
PAD ALCOHOL SWAB (MISCELLANEOUS) ×5 IMPLANT
PENCIL SMOKE EVACUATOR (MISCELLANEOUS) ×5 IMPLANT
PIN SAFETY STERILE (MISCELLANEOUS) IMPLANT
PUNCH BIOPSY DERMAL 4MM (MISCELLANEOUS) IMPLANT
SET BASIN DAY SURGERY F.S. (CUSTOM PROCEDURE TRAY) ×5 IMPLANT
SHEET MEDIUM DRAPE 40X70 STRL (DRAPES) ×5 IMPLANT
SIZER BREAST REUSE 445CC (SIZER) ×5
SIZER BRST REUSE 445CC (SIZER) ×3 IMPLANT
SLEEVE SCD COMPRESS KNEE MED (MISCELLANEOUS) ×5 IMPLANT
SPONGE LAP 18X18 RF (DISPOSABLE) ×15 IMPLANT
STAPLER VISISTAT 35W (STAPLE) ×5 IMPLANT
SUT ETHILON 2 0 FS 18 (SUTURE) IMPLANT
SUT MNCRL AB 4-0 PS2 18 (SUTURE) ×10 IMPLANT
SUT PDS AB 2-0 CT2 27 (SUTURE) IMPLANT
SUT VIC AB 3-0 PS1 18 (SUTURE)
SUT VIC AB 3-0 PS1 18XBRD (SUTURE) IMPLANT
SUT VIC AB 3-0 SH 27 (SUTURE) ×10
SUT VIC AB 3-0 SH 27X BRD (SUTURE) ×6 IMPLANT
SUT VICRYL 4-0 PS2 18IN ABS (SUTURE) ×10 IMPLANT
SYR 10ML LL (SYRINGE) ×30 IMPLANT
SYR 20ML LL LF (SYRINGE) IMPLANT
SYR 50ML LL SCALE MARK (SYRINGE) ×25 IMPLANT
SYR BULB IRRIG 60ML STRL (SYRINGE) ×10 IMPLANT
SYR CONTROL 10ML LL (SYRINGE) IMPLANT
SYR TB 1ML LL NO SAFETY (SYRINGE) ×5 IMPLANT
SYR TOOMEY IRRIG 70ML (MISCELLANEOUS) ×5
SYRINGE TOOMEY IRRIG 70ML (MISCELLANEOUS) ×3 IMPLANT
TOWEL GREEN STERILE FF (TOWEL DISPOSABLE) ×10 IMPLANT
TUBE CONNECTING 20'X1/4 (TUBING) ×3
TUBE CONNECTING 20X1/4 (TUBING) ×12 IMPLANT
TUBING INFILTRATION IT-10001 (TUBING) ×5 IMPLANT
TUBING SET GRADUATE ASPIR 12FT (MISCELLANEOUS) ×5 IMPLANT
UNDERPAD 30X36 HEAVY ABSORB (UNDERPADS AND DIAPERS) ×10 IMPLANT
YANKAUER SUCT BULB TIP NO VENT (SUCTIONS) ×5 IMPLANT

## 2019-06-02 NOTE — Anesthesia Preprocedure Evaluation (Addendum)
Anesthesia Evaluation  Patient identified by MRN, date of birth, ID band Patient awake    Reviewed: Allergy & Precautions, NPO status , Patient's Chart, lab work & pertinent test results  History of Anesthesia Complications (+) PONV and history of anesthetic complications  Airway Mallampati: I       Dental no notable dental hx. (+) Teeth Intact   Pulmonary neg pulmonary ROS,    Pulmonary exam normal breath sounds clear to auscultation       Cardiovascular negative cardio ROS Normal cardiovascular exam Rhythm:Regular Rate:Normal     Neuro/Psych negative neurological ROS  negative psych ROS   GI/Hepatic negative GI ROS, Neg liver ROS,   Endo/Other  negative endocrine ROS  Renal/GU negative Renal ROS  negative genitourinary   Musculoskeletal negative musculoskeletal ROS (+)   Abdominal Normal abdominal exam  (+)   Peds  Hematology negative hematology ROS (+)   Anesthesia Other Findings   Reproductive/Obstetrics negative OB ROS                            Anesthesia Physical  Anesthesia Plan  ASA: II  Anesthesia Plan: General   Post-op Pain Management:    Induction: Intravenous  PONV Risk Score and Plan: 4 or greater and Ondansetron, Dexamethasone, Midazolam, Droperidol and Treatment may vary due to age or medical condition  Airway Management Planned: Oral ETT  Additional Equipment: None  Intra-op Plan:   Post-operative Plan: Extubation in OR  Informed Consent: I have reviewed the patients History and Physical, chart, labs and discussed the procedure including the risks, benefits and alternatives for the proposed anesthesia with the patient or authorized representative who has indicated his/her understanding and acceptance.     Dental advisory given  Plan Discussed with: CRNA  Anesthesia Plan Comments:        Anesthesia Quick Evaluation

## 2019-06-02 NOTE — Interval H&P Note (Signed)
History and Physical Interval Note:  06/02/2019 6:54 AM  Stephanie [name]  has presented today for surgery, with the diagnosis of history breast ca, acquired absence breasts.  The various methods of treatment have been discussed with the patient and family. After consideration of risks, benefits and other options for treatment, the patient has consented to  Procedure(s): REMOVAL OF BILATERAL TISSUE EXPANDERS WITH PLACEMENT OF BILATERAL SILICONE BREAST IMPLANTS (Bilateral) LIPOFILLING FROM ABDOMEN TO BILATERAL CHEST (Bilateral) MINOR EXICISION OF LESION,LAYERED CLOSURE 1 CM (Left) as a surgical intervention.  The patient's history has been reviewed, patient examined, no change in status, stable for surgery.  I have reviewed the patient's chart and labs.  Questions were answered to the patient's satisfaction.     Arnoldo Hooker Shakeda Pearse

## 2019-06-02 NOTE — Discharge Instructions (Signed)
  Post Anesthesia Home Care Instructions  Activity: Get plenty of rest for the remainder of the day. A responsible individual must stay with you for 24 hours following the procedure.  For the next 24 hours, DO NOT: -Drive a car -Paediatric nurse -Drink alcoholic beverages -Take any medication unless instructed by your physician -Make any legal decisions or sign important papers.  Meals: Start with liquid foods such as gelatin or soup. Progress to regular foods as tolerated. Avoid greasy, spicy, heavy foods. If nausea and/or vomiting occur, drink only clear liquids until the nausea and/or vomiting subsides. Call your physician if vomiting continues.  Special Instructions/Symptoms: Your throat may feel dry or sore from the anesthesia or the breathing tube placed in your throat during surgery. If this causes discomfort, gargle with warm salt water. The discomfort should disappear within 24 hours.  If you had a scopolamine patch placed behind your ear for the management of post- operative nausea and/or vomiting:  1. The medication in the patch is effective for 72 hours, after which it should be removed.  Wrap patch in a tissue and discard in the trash. Wash hands thoroughly with soap and water. 2. You may remove the patch earlier than 72 hours if you experience unpleasant side effects which may include dry mouth, dizziness or visual disturbances. 3. Avoid touching the patch. Wash your hands with soap and water after contact with the patch.    Call your surgeon if you experience:   1.  Fever over 101.0. 2.  Inability to urinate. 3.  Nausea and/or vomiting. 4.  Extreme swelling or bruising at the surgical site. 5.  Continued bleeding from the incision. 6.  Increased pain, redness or drainage from the incision. 7.  Problems related to your pain medication. 8.  Any problems and/or concerns  *May have Tylenol at 12:30pm today 06/02/19 *May have Ibuprofen at 2:30pm today 06/02/19

## 2019-06-02 NOTE — Transfer of Care (Signed)
Immediate Anesthesia Transfer of Care Note  Patient: Stephanie [name]  Procedure(s) Performed: REMOVAL OF BILATERAL TISSUE EXPANDERS WITH PLACEMENT OF BILATERAL SILICONE BREAST IMPLANTS (Bilateral Breast) LIPOFILLING FROM ABDOMEN TO BILATERAL CHEST (Bilateral Abdomen) MINOR EXICISION OF LESION,LAYERED CLOSURE 1 CM (Left Chest)  Patient Location: PACU  Anesthesia Type:General  Level of Consciousness: awake and patient cooperative  Airway & Oxygen Therapy: Patient Spontanous Breathing and Patient connected to face mask oxygen  Post-op Assessment: Report given to RN and Post -op Vital signs reviewed and stable  Post vital signs: Reviewed and stable  Last Vitals:  Vitals Value Taken Time  BP    Temp    Pulse    Resp    SpO2      Last Pain:  Vitals:   06/02/19 0636  TempSrc: Oral  PainSc: 0-No pain         Complications: No apparent anesthesia complications

## 2019-06-02 NOTE — Anesthesia Procedure Notes (Signed)
Procedure Name: Intubation Date/Time: 06/02/2019 7:43 AM Performed by: Marrianne Mood, CRNA Pre-anesthesia Checklist: Patient identified, Emergency Drugs available, Suction available, Patient being monitored and Timeout performed Patient Re-evaluated:Patient Re-evaluated prior to induction Oxygen Delivery Method: Circle system utilized Preoxygenation: Pre-oxygenation with 100% oxygen Induction Type: IV induction Ventilation: Mask ventilation without difficulty Laryngoscope Size: Miller and 3 Grade View: Grade I Tube type: Oral Tube size: 7.0 mm Number of attempts: 1 Airway Equipment and Method: Stylet and Oral airway Placement Confirmation: ETT inserted through vocal cords under direct vision,  positive ETCO2 and breath sounds checked- equal and bilateral Secured at: 22 cm Tube secured with: Tape Dental Injury: Teeth and Oropharynx as per pre-operative assessment

## 2019-06-02 NOTE — Op Note (Signed)
Operative Note   DATE OF OPERATION: 5.21.21  LOCATION: [facility]-outpatient  SURGICAL DIVISION: Plastic Surgery  PREOPERATIVE DIAGNOSES:  1. History breast cancer 2. Acquired absence breasts 3. Dysplastic nevus left chest   POSTOPERATIVE DIAGNOSES:  same  PROCEDURE:  1. Removal bilateral chest tissue expanders and placement silicone implants 2. Lipofilling to bilateral chest total 150 ml 3. Excision benign lesion left chest 1 cm 4. Layered closure left chest 1.5 cm  SURGEON: Irene Limbo MD MBA  ASSISTANT: none  ANESTHESIA:  General.   EBL: 50 ml  COMPLICATIONS: None immediate.   INDICATIONS FOR PROCEDURE:  The patient, Stephanie [name], is a 34 y.o. female born on 1985/05/23, is here for staged breast reconstruction following nipple sparing mastectomies with immediate prepectoral expander acellular dermis reconstruction. Patient has a history of melanoma and recent shave biopsy with dysplastic nevus left chest. Plan excision of this lesion.   FINDINGS: Complete incorporation ADM bilateral noted. Natrelle Inspira Smooth Round Extra Projection 445 ml implants placed bilateral. REF SRX 445 RIGHT SN [id number] SN [id number]. 70 ml fat infiltrated over right chest, 80 ml fat infiltrated over left chest  DESCRIPTION OF PROCEDURE:  The patient's operative site was marked with the patient in the preoperative area to mark sternal notch, chest midline, anterior axillary lines.Supra and infraumbilcal abdomen, bilateral flanks marked for liposuction.The patientwas taken to the operating room. SCDs were placed and IV antibiotics were given. The patient's operative site was prepped and draped in a sterile fashion. A time out was performed and all information was confirmed to be correct.I began onleftside. Incision made through prior inframammary fold scar and carried through superficial fascia to acellular demis. ADM incised. Expander removed. Capsulotomy performed superiorly. Sizer  placed.  I then directed attention torightchest and implant cavity entered in similar manner. Expander removed and well incorporated ADM noted.Sizer placed and patient brought to upright sitting position. Natrelle Smooth Round Extra Projection 445 ml implants selected for bilateral placement.The patient was returned to supine position.  Sharp excision left chest lesion completed with margins diameter 1 cm. Layered closure completed with 4-0 monocryl in dermis and 4-0 monocryl subcuticular skin closure.  Stab incision made over bilateralabdomenand tumescent fluid infiltrated over supra and infraumbilical abdomen,bilateral flanks, total468ml tumescent infiltrated. Power assisted liposuction performed to endpoint symmetric contour and soft tissue thickness, total lipoaspirate300 ml. The fat was then washed and prepared by gravity for infiltration. Harvested fat was then infiltrated in subcutaneous plane throughout total envelope mastectomy flaps.  Each cavity irrigated with bacitracin, Ancef, gentamicinsolution.Hemostasis ensured.Each cavity then irrigated with Betadine saline solution. The implant was placed inrightchest andimplant orientation ensured. Closure completed with 3-0 vicryl to close superficial fascia and ADM over implant. 4-0 vicryl used to close dermis followed by 4-0 monocryl subcuticular. Implant placed in left chest cavity.Closure completedin similar fashion.Abdomen incisions approximated with simple 4-0 monocryl stitch.Tissue adhesive applied to chest incision. Dry dressing, followed by breastbinder and abdominal compression garment applied.  The patient was allowed to wake from anesthesia, extubated and taken to the recovery room in satisfactory condition.   SPECIMENS: left chest lesion  DRAINS: none

## 2019-06-02 NOTE — Anesthesia Postprocedure Evaluation (Signed)
Anesthesia Post Note  Patient: Stephanie [name]  Procedure(s) Performed: REMOVAL OF BILATERAL TISSUE EXPANDERS WITH PLACEMENT OF BILATERAL SILICONE BREAST IMPLANTS (Bilateral Breast) LIPOFILLING FROM ABDOMEN TO BILATERAL CHEST (Bilateral Abdomen) MINOR EXICISION OF LESION,LAYERED CLOSURE 1 CM (Left Chest)     Patient location during evaluation: PACU Anesthesia Type: General Level of consciousness: awake and alert Pain management: pain level controlled Vital Signs Assessment: post-procedure vital signs reviewed and stable Respiratory status: spontaneous breathing, nonlabored ventilation and respiratory function stable Cardiovascular status: blood pressure returned to baseline and stable Postop Assessment: no apparent nausea or vomiting Anesthetic complications: no    Last Vitals:  Vitals:   06/02/19 1030 06/02/19 1045  BP: 103/69   Pulse: 91 90  Resp: (!) 22 16  Temp:    SpO2: 100% 99%    Last Pain:  Vitals:   06/02/19 1030  TempSrc:   PainSc: 3                  Catalina Gravel

## 2019-06-04 ENCOUNTER — Emergency Department (HOSPITAL_COMMUNITY)
Admission: EM | Admit: 2019-06-04 | Discharge: 2019-06-04 | Disposition: A | Discharge status: Home / Self Care | Payer: BC Managed Care – PPO | Attending: Emergency Medicine | Admitting: Emergency Medicine

## 2019-06-04 ENCOUNTER — Emergency Department (HOSPITAL_COMMUNITY): Payer: BC Managed Care – PPO

## 2019-06-04 ENCOUNTER — Encounter (HOSPITAL_COMMUNITY): Payer: Self-pay

## 2019-06-04 ENCOUNTER — Other Ambulatory Visit: Payer: Self-pay

## 2019-06-04 DIAGNOSIS — G43909 Migraine, unspecified, not intractable, without status migrainosus: Secondary | ICD-10-CM | POA: Diagnosis not present

## 2019-06-04 DIAGNOSIS — R110 Nausea: Secondary | ICD-10-CM | POA: Diagnosis not present

## 2019-06-04 DIAGNOSIS — R519 Headache, unspecified: Secondary | ICD-10-CM

## 2019-06-04 DIAGNOSIS — Z853 Personal history of malignant neoplasm of breast: Secondary | ICD-10-CM | POA: Diagnosis not present

## 2019-06-04 DIAGNOSIS — Z9889 Other specified postprocedural states: Secondary | ICD-10-CM | POA: Insufficient documentation

## 2019-06-04 DIAGNOSIS — H53149 Visual discomfort, unspecified: Secondary | ICD-10-CM | POA: Insufficient documentation

## 2019-06-04 LAB — DIFFERENTIAL
Abs Immature Granulocytes: 0.04 10*3/uL (ref 0.00–0.07)
Basophils Absolute: 0.1 10*3/uL (ref 0.0–0.1)
Basophils Relative: 1 %
Eosinophils Absolute: 0.1 10*3/uL (ref 0.0–0.5)
Eosinophils Relative: 1 %
Immature Granulocytes: 0 %
Lymphocytes Relative: 37 %
Lymphs Abs: 3.7 10*3/uL (ref 0.7–4.0)
Monocytes Absolute: 0.6 10*3/uL (ref 0.1–1.0)
Monocytes Relative: 6 %
Neutro Abs: 5.4 10*3/uL (ref 1.7–7.7)
Neutrophils Relative %: 55 %

## 2019-06-04 LAB — I-STAT CHEM 8, ED
BUN: 16 mg/dL (ref 6–20)
Calcium, Ion: 1.11 mmol/L — ABNORMAL LOW (ref 1.15–1.40)
Chloride: 108 mmol/L (ref 98–111)
Creatinine, Ser: 1 mg/dL (ref 0.44–1.00)
Glucose, Bld: 88 mg/dL (ref 70–99)
HCT: 36 % (ref 36.0–46.0)
Hemoglobin: 12.2 g/dL (ref 12.0–15.0)
Potassium: 4 mmol/L (ref 3.5–5.1)
Sodium: 141 mmol/L (ref 135–145)
TCO2: 24 mmol/L (ref 22–32)

## 2019-06-04 LAB — I-STAT BETA HCG BLOOD, ED (MC, WL, AP ONLY): I-stat hCG, quantitative: <5 m[IU]/mL (ref <5)

## 2019-06-04 LAB — CBC
HCT: 37.4 % (ref 36.0–46.0)
Hemoglobin: 11.6 g/dL — ABNORMAL LOW (ref 12.0–15.0)
MCH: 25.8 pg — ABNORMAL LOW (ref 26.0–34.0)
MCHC: 31 g/dL (ref 30.0–36.0)
MCV: 83.3 fL (ref 80.0–100.0)
Platelets: 248 10*3/uL (ref 150–400)
RBC: 4.49 MIL/uL (ref 3.87–5.11)
RDW: 14.2 % (ref 11.5–15.5)
WBC: 9.9 10*3/uL (ref 4.0–10.5)
nRBC: 0 % (ref 0.0–0.2)

## 2019-06-04 LAB — COMPREHENSIVE METABOLIC PANEL
ALT: 11 U/L (ref 0–44)
AST: 19 U/L (ref 15–41)
Albumin: 3.8 g/dL (ref 3.5–5.0)
Alkaline Phosphatase: 41 U/L (ref 38–126)
Anion gap: 8 (ref 5–15)
BUN: 15 mg/dL (ref 6–20)
CO2: 22 mmol/L (ref 22–32)
Calcium: 8.6 mg/dL — ABNORMAL LOW (ref 8.9–10.3)
Chloride: 108 mmol/L (ref 98–111)
Creatinine, Ser: 1.1 mg/dL — ABNORMAL HIGH (ref 0.44–1.00)
GFR calc Af Amer: >60 mL/min (ref >60)
GFR calc non Af Amer: >60 mL/min (ref >60)
Glucose, Bld: 96 mg/dL (ref 70–99)
Potassium: 4 mmol/L (ref 3.5–5.1)
Sodium: 138 mmol/L (ref 135–145)
Total Bilirubin: 0.4 mg/dL (ref 0.3–1.2)
Total Protein: 6.4 g/dL — ABNORMAL LOW (ref 6.5–8.1)

## 2019-06-04 LAB — APTT: aPTT: 27 seconds (ref 24–36)

## 2019-06-04 LAB — PROTIME-INR
INR: 1 (ref 0.8–1.2)
Prothrombin Time: 13 seconds (ref 11.4–15.2)

## 2019-06-04 MED ORDER — KETOROLAC TROMETHAMINE 30 MG/ML IJ SOLN
30.0000 mg | Freq: Once | INTRAMUSCULAR | Status: AC
  Administered 2019-06-04: 30 mg via INTRAVENOUS
  Filled 2019-06-04: qty 1

## 2019-06-04 MED ORDER — MAGNESIUM SULFATE 2 GM/50ML IV SOLN
2.0000 g | Freq: Once | INTRAVENOUS | Status: AC
  Administered 2019-06-04: 2 g via INTRAVENOUS
  Filled 2019-06-04: qty 50

## 2019-06-04 MED ORDER — SODIUM CHLORIDE 0.9% FLUSH: 3.0000 mL | Freq: Once | INTRAVENOUS | Status: DC

## 2019-06-04 MED ORDER — SODIUM CHLORIDE 0.9 % IV BOLUS
1000.0000 mL | Freq: Once | INTRAVENOUS | Status: AC
  Administered 2019-06-04: 1000 mL via INTRAVENOUS

## 2019-06-04 MED ORDER — DIPHENHYDRAMINE HCL 50 MG/ML IJ SOLN
25.0000 mg | Freq: Once | INTRAMUSCULAR | Status: AC
  Administered 2019-06-04: 25 mg via INTRAVENOUS
  Filled 2019-06-04: qty 1

## 2019-06-04 MED ORDER — PROCHLORPERAZINE EDISYLATE 10 MG/2ML IJ SOLN
10.0000 mg | Freq: Once | INTRAMUSCULAR | Status: AC
  Administered 2019-06-04: 10 mg via INTRAVENOUS
  Filled 2019-06-04: qty 2

## 2019-06-04 NOTE — ED Provider Notes (Signed)
Pt reports headache has resolved and she want to go home.  Pt given discharge instructions on migraine headache. Follow up with primary care   Sidney Ace 06/04/19 0840    Veryl Speak, MD 06/04/19 1447

## 2019-06-04 NOTE — Discharge Instructions (Addendum)
Return if any problems.

## 2019-06-04 NOTE — ED Triage Notes (Signed)
Pt reports having breast reconstruction surgery on Friday for breast cancer. Has been having a migraine since 0030 tonight that came on suddenly, worse headache of her life, c/o of neck stiffness as well, photophobia, has a hx of migraines. Neuro intact bilaterally.

## 2019-06-04 NOTE — ED Notes (Signed)
Pt last took 2 aspirin this morning around 0030.

## 2019-06-04 NOTE — ED Provider Notes (Signed)
[facility] EMERGENCY DEPARTMENT Provider Note   CSN: [id number] Arrival date & time: 06/04/19  0125     History Chief Complaint  Patient presents with  . Migraine    Stephanie [name] is a 34 y.o. female with a hx of breast cancer, migraine presents to the Emergency Department complaining of acute, persistent, improving headache onset around 12:30 AM.  Patient reports she had surgery on Friday for breast reconstruction and liposuction.  Patient reports no complications with the surgery and was discharged in good condition.  She reports that on Saturday she had intermittent mild headache but no migraine features.  She alternated Tylenol and trended with adequate relief.  Patient reports she had sudden onset severe, worst headache of her life around 12:30 AM.  She reports she had associated nausea but no vomiting.  Triage RN documents neck stiffness however patient reports that she has noticed a small amount of soreness at the base of her skull which was present after waking from surgery.  She suspects this was secondary to positioning.  She denies stiffness, fevers, neck pain, decreased range of motion.  Patient denies vision changes, chest pain, shortness of breath, abdominal pain, vomiting, diarrhea, weakness, dizziness, syncope, numbness, tingling.   The history is provided by the patient and medical records. No language interpreter was used.       Past Medical History:  Diagnosis Date  . Breast cancer ([place])   . Family history of breast cancer   . Family history of lung cancer   . Family history of rectal cancer   . Melanoma ([place])   . PONV (postoperative nausea and vomiting)     Patient Active Problem List   Diagnosis Date Noted  . Family history of breast cancer   . Family history of rectal cancer   . Family history of lung cancer   . 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 Genetic testing 11/03/2018  . Malignant neoplasm of upper-outer  quadrant of left breast in female, estrogen receptor positive ([place]) 10/20/2018  . Hip pain, acute, left 07/30/2016  . Neck pain 08/10/2011  . Musculoskeletal pain 08/10/2011  . KNEE PAIN, RIGHT 01/30/2010  . 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 UNEQUAL LEG LENGTH 01/30/2010  . ABNORMALITY OF GAIT 01/30/2010    Past Surgical History:  Procedure Laterality Date  . BREAST RECONSTRUCTION WITH PLACEMENT OF TISSUE EXPANDER AND ALLODERM Bilateral 01/17/2019   Procedure: BILATERAL BREAST RECONSTRUCTION WITH PLACEMENT OF TISSUE EXPANDER AND ALLODERM;  Surgeon: Irene Limbo, MD;  Location: [facility];  Service: Plastics;  Laterality: Bilateral;  . melanoma surgery Right 2009   upper right arm  . NIPPLE SPARING MASTECTOMY WITH SENTINEL LYMPH NODE BIOPSY Bilateral 01/17/2019   Procedure: BILATERAL NIPPLE SPARING MASTECTOMIES WITH LEFT SENTINEL LYMPH NODE MAPPING;  Surgeon: Erroll Luna, MD;  Location: [facility];  Service: General;  Laterality: Bilateral;  . WISDOM TOOTH EXTRACTION       OB History   No obstetric history on file.     Family History  Problem Relation Age of Onset  . Breast cancer Maternal Aunt        diagnosed 80s  . Breast cancer Maternal Grandmother        diagnosed 71s  . Breast cancer Paternal Grandmother        diagnosed 43s  . Lung cancer Paternal Grandmother        thought to be breast cancer mets  . Rectal  cancer Paternal Grandmother        diagnosed 79s  . Hypertension Mother   . Heart disease Father   . Lung cancer Paternal Aunt        diagnosed late 70s  . Lung cancer Paternal Grandfather        diagnosed 79s    Social History   Tobacco Use  . Smoking status: Never Smoker  . Smokeless tobacco: Never Used  Substance Use Topics  . Alcohol use: Yes    Comment: occasional  . Drug use: Never    Home Medications Prior to Admission medications   Medication Sig Start Date End Date Taking? Authorizing Provider  cetirizine  (ZYRTEC) 10 MG tablet Take 10 mg by mouth daily.     Joline Salt, RN  escitalopram (LEXAPRO) 5 MG tablet Take 5 mg by mouth daily.    Joline Salt, RN  MECLIZINE HCL PO Take by mouth as needed. Vertigo    [provider]  Multiple Vitamin (MULTIVITAMIN) capsule Take 1 capsule by mouth daily.    Joline Salt, RN  SLYND 4 MG TABS  10/28/18   [provider]    Allergies    Oxycodone  Review of Systems   Review of Systems  Constitutional: Negative for appetite change, diaphoresis, fatigue, fever and unexpected weight change.  HENT: Negative for mouth sores.   Eyes: Positive for photophobia. Negative for visual disturbance.  Respiratory: Negative for cough, chest tightness, shortness of breath and wheezing.   Cardiovascular: Negative for chest pain.  Gastrointestinal: Positive for nausea. Negative for abdominal pain, constipation, diarrhea and vomiting.  Endocrine: Negative for polydipsia, polyphagia and polyuria.  Genitourinary: Negative for dysuria, frequency, hematuria and urgency.  Musculoskeletal: Negative for back pain and neck stiffness.  Skin: Negative for rash.  Allergic/Immunologic: Negative for immunocompromised state.  Neurological: Positive for headaches. Negative for syncope and light-headedness.  Hematological: Does not bruise/bleed easily.  Psychiatric/Behavioral: Negative for sleep disturbance. The patient is not nervous/anxious.     Physical Exam Updated Vital Signs BP 118/80 (BP Location: Right Arm)   Pulse 70   Temp 97.8 F (36.6 C) (Oral)   Resp 18   Ht 5\' 6"  (1.676 m)   Wt 70 kg   LMP  (LMP Unknown) Comment: birth control stops periods  SpO2 99%   BMI 24.91 kg/m   Physical Exam Vitals and nursing note reviewed.  Constitutional:      General: She is not in acute distress.    Appearance: She is well-developed. She is not diaphoretic.  HENT:     Head: Normocephalic and atraumatic.  Eyes:     General: No scleral icterus.     Conjunctiva/sclera: Conjunctivae normal.     Pupils: Pupils are equal, round, and reactive to light.     Comments: No horizontal, vertical or rotational nystagmus  Neck:     Comments: Full active and passive ROM without pain No midline or paraspinal tenderness No nuchal rigidity or meningeal signs Cardiovascular:     Rate and Rhythm: Normal rate and regular rhythm.  Pulmonary:     Effort: Pulmonary effort is normal. No respiratory distress.     Breath sounds: Normal breath sounds. No wheezing or rales.  Abdominal:     General: Bowel sounds are normal.     Palpations: Abdomen is soft.     Tenderness: There is no abdominal tenderness. There is no guarding or rebound.  Musculoskeletal:        General: Normal  range of motion.     Cervical back: Normal range of motion and neck supple.  Lymphadenopathy:     Cervical: No cervical adenopathy.  Skin:    General: Skin is warm and dry.     Findings: No rash.  Neurological:     Mental Status: She is alert and oriented to person, place, and time.     Cranial Nerves: No cranial nerve deficit.     Motor: No abnormal muscle tone.     Coordination: Coordination normal.     Comments: Mental Status:  Alert, oriented, thought content appropriate. Speech fluent without evidence of aphasia. Able to follow 2 step commands without difficulty.  Cranial Nerves:  II:  Peripheral visual fields grossly normal, pupils equal, round, reactive to light III,IV, VI: ptosis not present, extra-ocular motions intact bilaterally  V,VII: smile symmetric, facial light touch sensation equal VIII: hearing grossly normal bilaterally  IX,X: midline uvula rise  XI: bilateral shoulder shrug equal and strong XII: midline tongue extension  Motor:  5/5 in upper and lower extremities bilaterally including strong and equal grip strength and dorsiflexion/plantar flexion Sensory: light touch normal in all extremities.  Cerebellar: normal finger-to-nose with bilateral upper  extremities Gait: normal gait and balance CV: distal pulses palpable throughout   Psychiatric:        Behavior: Behavior normal.        Thought Content: Thought content normal.        Judgment: Judgment normal.     ED Results / Procedures / Treatments   Labs (all labs ordered are listed, but only abnormal results are displayed) Labs Reviewed  CBC - Abnormal; Notable for the following components:      Result Value   Hemoglobin 11.6 (*)    MCH 25.8 (*)    All other components within normal limits  COMPREHENSIVE METABOLIC PANEL - Abnormal; Notable for the following components:   Creatinine, Ser 1.10 (*)    Calcium 8.6 (*)    Total Protein 6.4 (*)    All other components within normal limits  I-STAT CHEM 8, ED - Abnormal; Notable for the following components:   Calcium, Ion 1.11 (*)    All other components within normal limits  PROTIME-INR  APTT  DIFFERENTIAL  CBG MONITORING, ED  I-STAT BETA HCG BLOOD, ED ([facility], [facility], [facility] ONLY)    Radiology CT HEAD WO CONTRAST  Result Date: 06/04/2019 CLINICAL DATA:  Initial evaluation for acute headache. EXAM: CT HEAD WITHOUT CONTRAST TECHNIQUE: Contiguous axial images were obtained from the base of the skull through the vertex without intravenous contrast. COMPARISON:  None available. FINDINGS: Brain: Cerebral volume within normal limits for patient age. No evidence for acute intracranial hemorrhage. No findings to suggest acute large vessel territory infarct. No mass lesion, midline shift, or mass effect. Ventricles are normal in size without evidence for hydrocephalus. No extra-axial fluid collection identified. Vascular: No hyperdense vessel identified. Skull: Scalp soft tissues demonstrate no acute abnormality. Calvarium intact. Sinuses/Orbits: Globes and orbital soft tissues within normal limits. Visualized paranasal sinuses are clear. No mastoid effusion. IMPRESSION: Normal head CT. No acute intracranial abnormality identified. Electronically  Signed   By: Jeannine Boga M.D.   On: 06/04/2019 03:23    Procedures Procedures (including critical care time)  Medications Ordered in ED Medications  sodium chloride flush (NS) 0.9 % injection 3 mL (has no administration in time range)  ketorolac (TORADOL) 30 MG/ML injection 30 mg (30 mg Intravenous Given 06/04/19 0631)  prochlorperazine (COMPAZINE) injection 10  mg (10 mg Intravenous Given 06/04/19 0631)  diphenhydrAMINE (BENADRYL) injection 25 mg (25 mg Intravenous Given 06/04/19 0631)  sodium chloride 0.9 % bolus 1,000 mL (1,000 mLs Intravenous New Bag/Given 06/04/19 0629)  magnesium sulfate IVPB 2 g 50 mL (0 g Intravenous Stopped 06/04/19 0644)    ED Course  I have reviewed the triage vital signs and the nursing notes.  Pertinent labs & imaging results that were available during my care of the patient were reviewed by me and considered in my medical decision making (see chart for details).    MDM Rules/Calculators/A&P                       Pt presents with acute onset headache.  She reports worst headache of her life that woke her from sleep.  CT scan within 6 hours shows no evidence of intracranial hemorrhage or lesions.  Labs are reassuring.  Mild anemia is noted at 11.6 down from 13 preop.  This would be expected.  Creatinine 1.1 today but appears baseline for patient.  Patient is without fever, tachycardia or hypotension.  Doubt subarachnoid hemorrhage, meningitis, dural venous thrombosis.  Will give migraine cocktail and reassess.  6:55 AM At shift change care was transferred to [facility] who will re-evaulate and determine disposition.    Final Clinical Impression(s) / ED Diagnoses Final diagnoses:  Bad headache  Nausea    Rx / DC Orders ED Discharge Orders    None       Emanuele Mcwhirter, Gwenlyn Perking 06/04/19 [place], Tila, MD 06/04/19 0725

## 2019-06-05 ENCOUNTER — Encounter: Payer: Self-pay | Admitting: *Deleted

## 2019-06-06 LAB — SURGICAL PATHOLOGY

## 2019-06-15 ENCOUNTER — Ambulatory Visit: Payer: BC Managed Care – PPO | Admitting: Physician Assistant

## 2019-06-15 DIAGNOSIS — H04123 Dry eye syndrome of bilateral lacrimal glands: Secondary | ICD-10-CM | POA: Diagnosis not present

## 2019-06-22 DIAGNOSIS — Z23 Encounter for immunization: Secondary | ICD-10-CM | POA: Diagnosis not present

## 2019-07-14 DIAGNOSIS — Z23 Encounter for immunization: Secondary | ICD-10-CM | POA: Diagnosis not present

## 2019-07-31 DIAGNOSIS — D0512 Intraductal carcinoma in situ of left breast: Secondary | ICD-10-CM | POA: Diagnosis not present

## 2019-08-09 DIAGNOSIS — F4323 Adjustment disorder with mixed anxiety and depressed mood: Secondary | ICD-10-CM | POA: Diagnosis not present

## 2019-10-17 DIAGNOSIS — Z01419 Encounter for gynecological examination (general) (routine) without abnormal findings: Secondary | ICD-10-CM | POA: Diagnosis not present

## 2019-10-17 DIAGNOSIS — Z0000 Encounter for general adult medical examination without abnormal findings: Secondary | ICD-10-CM | POA: Diagnosis not present

## 2019-10-17 DIAGNOSIS — Z6825 Body mass index (BMI) 25.0-25.9, adult: Secondary | ICD-10-CM | POA: Diagnosis not present

## 2019-10-17 DIAGNOSIS — Z1329 Encounter for screening for other suspected endocrine disorder: Secondary | ICD-10-CM | POA: Diagnosis not present

## 2019-10-17 DIAGNOSIS — Z131 Encounter for screening for diabetes mellitus: Secondary | ICD-10-CM | POA: Diagnosis not present

## 2019-10-17 DIAGNOSIS — Z130 Encounter for screening for diseases of the blood and blood-forming organs and certain disorders involving the immune mechanism: Secondary | ICD-10-CM | POA: Diagnosis not present

## 2019-10-17 DIAGNOSIS — Z1389 Encounter for screening for other disorder: Secondary | ICD-10-CM | POA: Diagnosis not present

## 2019-11-29 ENCOUNTER — Ambulatory Visit: Payer: BC Managed Care – PPO | Admitting: Physician Assistant

## 2020-02-06 ENCOUNTER — Encounter: Payer: Self-pay | Admitting: Dermatology

## 2020-02-06 ENCOUNTER — Ambulatory Visit: Payer: BC Managed Care – PPO | Admitting: Dermatology

## 2020-02-06 ENCOUNTER — Other Ambulatory Visit: Payer: Self-pay

## 2020-02-06 DIAGNOSIS — Z1283 Encounter for screening for malignant neoplasm of skin: Secondary | ICD-10-CM

## 2020-02-06 DIAGNOSIS — Z170 Estrogen receptor positive status [ER+]: Secondary | ICD-10-CM

## 2020-02-06 DIAGNOSIS — D225 Melanocytic nevi of trunk: Secondary | ICD-10-CM | POA: Diagnosis not present

## 2020-02-06 DIAGNOSIS — D1801 Hemangioma of skin and subcutaneous tissue: Secondary | ICD-10-CM

## 2020-02-06 DIAGNOSIS — Z85820 Personal history of malignant melanoma of skin: Secondary | ICD-10-CM | POA: Diagnosis not present

## 2020-02-06 DIAGNOSIS — D229 Melanocytic nevi, unspecified: Secondary | ICD-10-CM

## 2020-02-06 DIAGNOSIS — C50412 Malignant neoplasm of upper-outer quadrant of left female breast: Secondary | ICD-10-CM

## 2020-02-07 ENCOUNTER — Encounter: Payer: Self-pay | Admitting: Dermatology

## 2020-02-07 NOTE — Progress Notes (Signed)
   Follow-Up Visit   Subjective  Stephanie [name] is a 35 y.o. female who presents for the following: Annual Exam.  General skin examination Location:  Duration:  Quality: Without known change in pigmented lesions Associated Signs/Symptoms: Modifying Factors:  Severity:  Timing: Context:   Objective  Well appearing patient in no apparent distress; mood and affect are within normal limits. Objective  Left Breast, Right Arm: Clear scars today  Objective  Head to Toe: Head to toe exam done today. No signs of atypical moles, melanoma or non mole skin cancer. No recurrent pigmentation at sites of dysplastic moles chest.  Objective  Left Abdomen (side) - Lower, Left Side Superior: Clear scars today  Objective  Chest - Medial [facility]): Multiple 1 to 2 mm red dots especially left upper breast. No palpability.   A full examination was performed including scalp, head, eyes, ears, nose, lips, neck, chest, axillae, abdomen, back, buttocks, bilateral upper extremities, bilateral lower extremities, hands, feet, fingers, toes, fingernails, and toenails. All findings within normal limits unless otherwise noted below.   Assessment & Plan    Personal history of malignant melanoma of skin (2) Right Arm; Left Breast  Yearly skin check  Skin exam for malignant neoplasm Head to Toe  Yearly skin check plus encouraged to self examine twice annually. Continued ultraviolet protection.  Atypical mole (2) Left Abdomen (side) - Lower; Left Side Superior  Yearly skin check  Hemangioma of skin Chest - Medial ([place])  Benign okay to leave.  Malignant neoplasm of upper-outer quadrant of left breast in female, estrogen receptor positive ([facility])     I, Lavonna Monarch, MD, have reviewed all documentation for this visit.  The documentation on 02/07/20 for the exam, diagnosis, procedures, and orders are all accurate and complete.

## 2020-02-12 NOTE — Progress Notes (Signed)
[facility]  [phone] [phone]     ID: Stephanie [name] DOB: 1985/03/28  MR#: [id number]  ONG#:[id number]  Patient Care Team: Manon Hilding, MD as PCP - General (Family Medicine) Erroll Luna, MD as Consulting Physician (General Surgery) Irene Limbo, MD as Consulting Physician (Plastic Surgery) Quinlee Sciarra, Virgie Dad, MD as Consulting Physician (Oncology) Eppie Gibson, MD as Consulting Physician (Radiation Oncology) Rockwell Germany, RN as Oncology Nurse Navigator Mauro Kaufmann, RN as Oncology Nurse Navigator Azucena Fallen, MD as Consulting Physician (Obstetrics and Gynecology) Lavonna Monarch, MD as Consulting Physician (Dermatology) Azucena Fallen, MD as Consulting Physician (Obstetrics and Gynecology) Chauncey Cruel, MD OTHER MD:  CHIEF COMPLAINT: 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  CURRENT TREATMENT: Observation   INTERVAL HISTORY: Stephanie [name] returns today for follow up of her microinvasive breast cancer. She continues under observation.   Since her last visit, she underwent breast implant placement on 06/02/2019 under Dr. Iran Planas. Pathology of the left chest wall skin [phone]) showed: junctional and lentiginous dysplastic nevus, mild atypia, with scar, margins free.   REVIEW OF SYSTEMS: Stephanie [name] did very well with her breast surgery and is very pleased with the cosmetic result.  She has a little bit of discomfort associated with the surgery but nothing extraordinary.  She is back to work full-time and also exercising regularly.   COVID 19 VACCINATION STATUS: Status post Pfizer x2, no booster as of 02/13/2020; patient also had COVID-31 January 2020   HISTORY OF CURRENT ILLNESS: From the original intake note:  She underwent routine baseline screening mammogram in early October that showed calcifications spanning 2cm in the upper outer left breast.  She underwent stereotactic biopsy  performed on 10/24/2018 and this showed ductal carcinoma in situ, estrogen receptor positive, progesterone receptor negative.  The patient's subsequent history is as detailed below.   PAST MEDICAL HISTORY: Past Medical History:  Diagnosis Date  . Atypical mole 03/14/2019   mod-left side superior  . Breast cancer ([place])   . Family history of breast cancer   . Family history of lung cancer   . Family history of rectal cancer   . 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 PONV (postoperative nausea and vomiting)     PAST SURGICAL HISTORY: Past Surgical History:  Procedure Laterality Date  . BREAST RECONSTRUCTION WITH PLACEMENT OF TISSUE EXPANDER AND ALLODERM Bilateral 01/17/2019   Procedure: BILATERAL BREAST RECONSTRUCTION WITH PLACEMENT OF TISSUE EXPANDER AND ALLODERM;  Surgeon: Irene Limbo, MD;  Location: [facility];  Service: Plastics;  Laterality: Bilateral;  . LESION REMOVAL Left 06/02/2019   Procedure: MINOR EXICISION OF LESION,LAYERED CLOSURE 1 CM;  Surgeon: Irene Limbo, MD;  Location: [facility];  Service: Plastics;  Laterality: Left;  . LIPOSUCTION WITH LIPOFILLING Bilateral 06/02/2019   Procedure: LIPOFILLING FROM ABDOMEN TO BILATERAL CHEST;  Surgeon: Irene Limbo, MD;  Location: [facility];  Service: Plastics;  Laterality: Bilateral;  . melanoma surgery Right 2009   upper right arm  . NIPPLE SPARING MASTECTOMY WITH SENTINEL LYMPH NODE BIOPSY Bilateral 01/17/2019   Procedure: BILATERAL NIPPLE SPARING MASTECTOMIES WITH LEFT SENTINEL LYMPH NODE MAPPING;  Surgeon: Erroll Luna, MD;  Location: [facility];  Service: General;  Laterality: Bilateral;  . REMOVAL OF BILATERAL TISSUE EXPANDERS WITH PLACEMENT OF BILATERAL BREAST IMPLANTS Bilateral 06/02/2019   Procedure: REMOVAL OF BILATERAL TISSUE EXPANDERS WITH PLACEMENT OF BILATERAL SILICONE BREAST IMPLANTS;  Surgeon: Irene Limbo, MD;  Location: [facility];  Service: Plastics;  Laterality: Bilateral;  . WISDOM TOOTH EXTRACTION      FAMILY HISTORY Family History  Problem Relation Age of Onset  . Breast cancer Maternal Aunt        diagnosed 63s  . Breast cancer Maternal Grandmother        diagnosed 90s  . Breast cancer Paternal Grandmother        diagnosed 19s  . Lung cancer Paternal Grandmother        thought to be breast cancer mets  . Rectal cancer Paternal Grandmother        diagnosed 45s  . Hypertension Mother   . Heart disease Father   . Lung cancer Paternal Aunt        diagnosed late 22s  . Lung cancer Paternal Grandfather        diagnosed 82s    GYNECOLOGIC HISTORY:  No LMP recorded. (Menstrual status: Irregular Periods). Menarche: 35 years old [place] P 0 Contraceptive Loestrin, OCP for 12-13 years, now on Slynd Hysterectomy? no Salpingo-oophorectomy? no   SOCIAL HISTORY: (updated 10/2018) Stephanie [name] is married and lives with her husband Mulberry in [place], [place].  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 Stephanie [name] enjoys exercising, and running.  She also enjoys hunting and fishing with her husband along with spending time with her nieces and nephews.  Stephanie [name] does not plan on childbearing in the future.    ADVANCED DIRECTIVES: In the absence of any documentation to the contrary, the patient's spouse is their HCPOA.    HEALTH MAINTENANCE: Social History   Tobacco Use  . Smoking status: Never Smoker  . Smokeless tobacco: Never Used  Vaping Use  . Vaping Use: Never used  Substance Use Topics  . Alcohol use: Yes    Comment: occasional  . Drug use: Never     Colonoscopy: n/a (age)  PAP: 09/2018  Bone density: n/a (age)   Allergies  Allergen Reactions  . Oxycodone Nausea Only    Severe nausea    Current Outpatient Medications  Medication Sig Dispense Refill  . cetirizine (ZYRTEC) 10 MG tablet Take 10 mg by mouth daily.     Marland Kitchen  escitalopram (LEXAPRO) 5 MG tablet Take 5 mg by mouth daily. (Patient not taking: Reported on 02/06/2020)    . MECLIZINE HCL PO Take by mouth as needed. Vertigo    . Multiple Vitamin (MULTIVITAMIN) capsule Take 1 capsule by mouth daily.    Marland Kitchen SLYND 4 MG TABS      No current facility-administered medications for this visit.    OBJECTIVE: Young white woman who appears well  Vitals:   02/13/20 1129  BP: 118/67  Pulse: 72  Resp: 17  Temp: 97.9 F (36.6 C)  SpO2: 100%     Body mass index is 24.87 kg/m.   Wt Readings from Last 3 Encounters:  02/13/20 154 lb 1.6 oz (69.9 kg)  06/04/19 154 lb 5.2 oz (70 kg)  06/02/19 153 lb 10.6 oz (69.7 kg)  ECOG FS:1 - Symptomatic but completely ambulatory  Sclerae unicteric, EOMs intact Wearing a mask No cervical or supraclavicular adenopathy Lungs no rales or rhonchi Heart regular rate and rhythm Abd soft, nontender, positive bowel sounds MSK no focal spinal tenderness, no upper extremity lymphedema Neuro: nonfocal, well oriented, appropriate affect Breasts: Status post bilateral skin sparing mastectomies with bilateral silicone implant replacement.  The cosmetic result is excellent.  The scars are not  apparent.  There is very good symmetry.  There is no evidence of local recurrence.  Both axillae are benign.   LAB RESULTS:  CMP     Component Value Date/Time   NA 141 06/04/2019 0219   K 4.0 06/04/2019 0219   CL 108 06/04/2019 0219   CO2 22 06/04/2019 0201   GLUCOSE 88 06/04/2019 0219   BUN 16 06/04/2019 0219   CREATININE 1.00 06/04/2019 0219   CALCIUM 8.6 (L) 06/04/2019 0201   PROT 6.4 (L) 06/04/2019 0201   ALBUMIN 3.8 06/04/2019 0201   AST 19 06/04/2019 0201   ALT 11 06/04/2019 0201   ALKPHOS 41 06/04/2019 0201   BILITOT 0.4 06/04/2019 0201   GFRNONAA >60 06/04/2019 0201   GFRAA >60 06/04/2019 0201    No results found for: TOTALPROTELP, ALBUMINELP, A1GS, A2GS, BETS, BETA2SER, GAMS, MSPIKE, SPEI  No results found for:  Nils Pyle, [facility]  Lab Results  Component Value Date   WBC 9.9 06/04/2019   NEUTROABS 5.4 06/04/2019   HGB 12.2 06/04/2019   HCT 36.0 06/04/2019   MCV 83.3 06/04/2019   PLT 248 06/04/2019      Chemistry      Component Value Date/Time   NA 141 06/04/2019 0219   K 4.0 06/04/2019 0219   CL 108 06/04/2019 0219   CO2 22 06/04/2019 0201   BUN 16 06/04/2019 0219   CREATININE 1.00 06/04/2019 0219      Component Value Date/Time   CALCIUM 8.6 (L) 06/04/2019 0201   ALKPHOS 41 06/04/2019 0201   AST 19 06/04/2019 0201   ALT 11 06/04/2019 0201   BILITOT 0.4 06/04/2019 0201      No results found for: LABCA2  No components found for: YCXKGY185  No results for input(s): INR in the last 168 hours.  No results found for: LABCA2  No results found for: UDJ497  No results found for: WYO378  No results found for: HYI502  No results found for: CA2729  No components found for: HGQUANT  No results found for: CEA1 / No results found for: CEA1   No results found for: AFPTUMOR  No results found for: CHROMOGRNA  No results found for: HGBA, HGBA2QUANT, HGBFQUANT, HGBSQUAN (Hemoglobinopathy evaluation)   No results found for: LDH  No results found for: IRON, TIBC, IRONPCTSAT (Iron and TIBC)  No results found for: FERRITIN  Urinalysis No results found for: COLORURINE, APPEARANCEUR, LABSPEC, PHURINE, GLUCOSEU, HGBUR, BILIRUBINUR, KETONESUR, PROTEINUR, UROBILINOGEN, NITRITE, LEUKOCYTESUR   STUDIES: No results found.   ASSESSMENT: 35 y.o. woman with left breast ductal carcinoma in situ, estrogen receptor positive, initially biopsied 10/24/2018   (1) Bilateral Breast MRI on 11/02/2018 shows biopsy proven left breast DCIS spanning 2.6 x 2.3 x 2.5 cm, no other left breast malignancy, no evidence of right breast malignancy  (2) Genetic testing 11/03/2018 through the  Multi-Cancer Panel offered by [facility] found no deleterious mutations in AIP, ALK, APC, ATM,  AXIN2,BAP1,  BARD1, BLM, BMPR1A, BRCA1, BRCA2, BRIP1, CASR, CDC73, CDH1, CDK4, CDKN1B, CDKN1C, CDKN2A (p14ARF), CDKN2A (p16INK4a), CEBPA, CHEK2, CTNNA1, DICER1, DIS3L2, EGFR (c.2369C>T, p.Thr790Met variant only), EPCAM (Deletion/duplication testing only), FH, FLCN, GATA2, GPC3, GREM1 (Promoter region deletion/duplication testing only), HOXB13 (c.251G>A, p.Gly84Glu), HRAS, KIT, MAX, MEN1, MET, MITF (c.952G>A, p.Glu318Lys variant only), MLH1, MSH2, MSH3, MSH6, MUTYH, NBN, NF1, NF2, NTHL1, PALB2, PDGFRA, PHOX2B, PMS2, POLD1, POLE, POT1, PRKAR1A, PTCH1, PTEN, RAD50, RAD51C, RAD51D, RB1, RECQL4, RET, RUNX1, SDHAF2, SDHA (sequence changes only), SDHB, SDHC, SDHD, SMAD4, SMARCA4, SMARCB1, SMARCE1, STK11, SUFU, TERC, TERT,  TMEM127, TP53, TSC1, TSC2, VHL, WRN and WT1.    (3) status post bilateral nipple sparing mastectomies with left sentinel lymph node sampling, showing  (a) on the right, no evidence of malignancy  (b) on the left, pT1(mic) pN0, stage IA invasive carcinoma, grade 2, with negative margins   (i) four left axillary sentinel nodes removed   (ii) ductal carcinoma in situ, grade 3, also present, with negative margins   (iii) insufficient invasive tissue for prognostic panel evaluation  (4) Adjuvant radiation not indicated  (5) Anti-estrogen therapy considered: patient opted against  (6) status post definitive silicone implant placement 06/02/2019  Natrelle Inspira Smooth Round Extra Projection 445 ml implants placed bilateral. REF SRX 445 RIGHT SN [id number] LEFT SN [id number]. 70 ml fat infiltrated over right chest, 80 ml fat infiltrated over left chest  PLAN: Stephanie [name] did very well with her surgery and all she needs now is follow-up.  She does have her primary care physician whom she sees yearly and also sees Dr. Genia Harold her gynecologist once a year.  She will also be following up with Dr. Iran Planas regularly.  Accordingly I do not think we need to see her on a routine basis.  I offered her  participation in our survivorship but at this point she opts against as she is very satisfied with her other physicians care.  I gave her a copy of her data and of course I will be glad to see her at any point in the future if the need arises but as of now are making no further routine appointments for her here  Total encounter time 20 minutes.  Virgie Dad. Stephanie Monforte, MD  02/13/2020 12:04 PM Medical Oncology and Hematology [facility] [place], [place] [address] Tel. [phone]    Fax. [phone]   I, Wilburn Mylar, am acting as scribe for Dr. Virgie Dad. Stephanie [name].  I, Lurline Del MD, have reviewed the above documentation for accuracy and completeness, and I agree with the above.   *Total Encounter Time as defined by the Centers for Medicare and Medicaid Services includes, in addition to the face-to-face time of a patient visit (documented in the note above) non-face-to-face time: obtaining and reviewing outside history, ordering and reviewing medications, tests or procedures, care coordination (communications with other health care professionals or caregivers) and documentation in the medical record.

## 2020-02-13 ENCOUNTER — Inpatient Hospital Stay: Payer: BC Managed Care – PPO | Attending: Oncology | Admitting: Oncology

## 2020-02-13 ENCOUNTER — Other Ambulatory Visit: Payer: Self-pay

## 2020-02-13 VITALS — BP 118/67 | HR 72 | Temp 97.9°F | Resp 17 | Ht 66.0 in | Wt 154.1 lb

## 2020-02-13 DIAGNOSIS — D0512 Intraductal carcinoma in situ of left breast: Secondary | ICD-10-CM

## 2020-02-13 DIAGNOSIS — Z170 Estrogen receptor positive status [ER+]: Secondary | ICD-10-CM

## 2020-02-13 DIAGNOSIS — C50412 Malignant neoplasm of upper-outer quadrant of left female breast: Secondary | ICD-10-CM | POA: Diagnosis present

## 2020-02-15 ENCOUNTER — Telehealth: Payer: Self-pay | Admitting: Oncology

## 2020-02-15 NOTE — Telephone Encounter (Signed)
No 2/1 los. No changes made to pt's schedule  

## 2020-08-06 ENCOUNTER — Ambulatory Visit: Payer: BC Managed Care – PPO | Admitting: Physician Assistant

## 2020-08-06 ENCOUNTER — Other Ambulatory Visit: Payer: Self-pay

## 2020-08-06 ENCOUNTER — Encounter: Payer: Self-pay | Admitting: Physician Assistant

## 2020-08-06 DIAGNOSIS — Z86018 Personal history of other benign neoplasm: Secondary | ICD-10-CM

## 2020-08-06 DIAGNOSIS — L700 Acne vulgaris: Secondary | ICD-10-CM

## 2020-08-06 DIAGNOSIS — Z1283 Encounter for screening for malignant neoplasm of skin: Secondary | ICD-10-CM

## 2020-08-06 DIAGNOSIS — Z85820 Personal history of malignant melanoma of skin: Secondary | ICD-10-CM | POA: Diagnosis not present

## 2020-08-06 DIAGNOSIS — L821 Other seborrheic keratosis: Secondary | ICD-10-CM | POA: Diagnosis not present

## 2020-08-06 MED ORDER — WINLEVI 1 % EX CREA
1.0000 | TOPICAL_CREAM | Freq: Every evening | CUTANEOUS | 6 refills | Status: DC
Start: 2020-08-06 — End: 2021-11-19

## 2020-08-06 NOTE — Progress Notes (Signed)
   Follow-Up Visit   Subjective  Stephanie [name] is a 35 y.o. female who presents for the following: Follow-up (6 month check history of MM and atypical moles, left upper thigh new raised to touch lesion x months patient not worried about it but wants it checked ).   The following portions of the chart were reviewed this encounter and updated as appropriate:  Tobacco  Allergies  Meds  Problems  Med Hx  Surg Hx  Fam Hx      Objective  Well appearing patient in no apparent distress; mood and affect are within normal limits.  A full examination was performed including scalp, head, eyes, ears, nose, lips, neck, chest, axillae, abdomen, back, buttocks, bilateral upper extremities, bilateral lower extremities, hands, feet, fingers, toes, fingernails, and toenails. All findings within normal limits unless otherwise noted below.  Left Thigh - lateral Light brown plaque.   Head - Anterior (Face) Erythematous papules  with post inflammatory hyperpigmentatioin.    Assessment & Plan  Seborrheic keratosis Left Thigh - lateral  observe  Acne vulgaris Head - Anterior (Face)  Spironolactone potentially may be helpful. She will speak with her OB/Gyn.   Clascoterone (WINLEVI) 1 % CREA - Head - Anterior (Face) Apply 1 application topically at bedtime.    I, Rendy Lazard, PA-C, have reviewed all documentation's for this visit.  The documentation on 08/06/20 for the exam, diagnosis, procedures and orders are all accurate and complete.

## 2020-08-06 NOTE — Patient Instructions (Addendum)
Seborrheic Keratosis A seborrheic keratosis is a common, noncancerous (benign) skin growth. These growths are velvety, waxy, rough, tan, brown, or black spots that appear on the skin. These skin growths can be flat or raised, andscaly. What are the causes? The cause of this condition is not known. What increases the risk? You are more likely to develop this condition if you: Have a family history of seborrheic keratosis. Are 50 or older. Are pregnant. Have had estrogen replacement therapy. What are the signs or symptoms? Symptoms of this condition include growths on the face, chest, shoulders, back, or other areas. These growths: Are usually painless, but may become irritated and itchy. Can be yellow, brown, black, or other colors. Are slightly raised or have a flat surface. Are sometimes rough or wart-like in texture. Are often velvety or waxy on the surface. Are round or oval-shaped. Often occur in groups, but may occur as a single growth. How is this diagnosed? This condition is diagnosed with a medical history and physical exam. A sample of the growth may be tested (skin biopsy). You may need to see a skin specialist (dermatologist). How is this treated? Treatment is not usually needed for this condition, unless the growths are irritated or bleed often. You may also choose to have the growths removed if you do not like their appearance. Most commonly, these growths are treated with a procedure in which liquid nitrogen is applied to "freeze" off the growth (cryosurgery). They may also be burned off with electricity (electrocautery) or removed by scraping (curettage). Follow these instructions at home: Watch your growth for any changes. Keep all follow-up visits as told by your health care provider. This is important. Do not scratch or pick at the growth or growths. This can cause them to become irritated or infected. Contact a health care provider if: You suddenly have many new  growths. Your growth bleeds, itches, or hurts. Your growth suddenly becomes larger or changes color. Summary A seborrheic keratosis is a common, noncancerous (benign) skin growth. Treatment is not usually needed for this condition, unless the growths are irritated or bleed often. Watch your growth for any changes. Contact a health care provider if you suddenly have many new growths or your growth suddenly becomes larger or changes color. Keep all follow-up visits as told by your health care provider. This is important. This information is not intended to replace advice given to you by your health care provider. Make sure you discuss any questions you have with your healthcare provider. Document Revised: 05/13/2017 Document Reviewed: 05/13/2017 Elsevier Patient Education  [place].      Isotretinoin (Accutane)  You and your dermatologist have decided that Accutane (isotretinoin) is warranted for the treatment of your acne.  Accutane (isotretinoin) is an oral medication that is derived from vitamin A.  It was approved by the FDA in 1983 for the treatment of severe acne that has the potential to cause permanent scarring if untreated.  It has produced dramatic results in most patients with acne with few serious side effects.  Amnesteem, Claravis, and Sotret are generic versions of isotretinoin and your dermatologist recommends that you use the same brand each month.  A typical course of Accutane is approximately 20 weeks.  The following information is designed to give you a better understanding of your treatment with Accutane.      You will need to see your dermatologist on a monthly basis while on Accutane so that your dermatologist can monitor your progress, adjust  your dose if necessary, and evaluate your blood tests with you.  Blood tests will be done monthly during therapy, which usually lasts 20 weeks.  The blood tests check for any elevations in cholesterol and triglycerides.   Please note the following regarding side effects:  The most common side effect is excess dryness of the skin and occasionally the scalp.  The lips are especially affected.  Frequent moisturizing of the skin is necessary to prevent rashes and itching.  Your dermatologist will give you separate instructions on what moisturizers to use during your treatment with Accutane.  Dryness of the eyes can also occur and is especially noticeable if you wear contact lens.   More frequent lubrication of the eyes with a saline eye wetting solution is necessary alleviate the symptom of dry eyes.    Nail changes such as ingrown nails are occasionally reported effects.    Occasionally, while on Accutane, patients will experience headaches or muscle aches, usually at the onset of treatment.  The occasional headaches or muscle aches are temporary and can usually be relieved by taking Tylenol as per package directions.    Due to Accutane's tendency to sometimes elevate lipids such as cholesterol or triglycerides, you should be careful to avoid excess consumption of foods or alcoholic beverages that can raise your lipids, such as red meats, eggs, whole milk, dairy products, and alcohol.  If your lipids become elevated, you will be notified and given diet instructions or your dose of Accutane will be lowered, altered, or temporarily discontinued.   Occasionally patients may have an elevation of liver enzymes noted on monthly blood tests.  There have been no reports of hepatic failure or death to liver disease with Accutane.   Discontinuing Accutane may be necessary if the enzymes are found to be elevated.    There is the potential that Accutane can cause alterations in your mood and even depression.   If changes in mood are noted, please notify your dermatologist immediately.     For female patients, you must use two forms of birth control while on Accutane because it can cause birth defects if pregnancy occurs while you  are on the drug.  Your dermatologist recommends that you wait 3 months after completing your Accutane to attempt to become pregnant.  You must adhere to the [facility] program, which has been instituted to prevent the possibility of a patient receiving Accutane if pregnant.  Rare reports of inflammatory bowel disease arising after Accutane treatment can be found in the dermatological literature.  The association is very controversial and the risk is minimal.     Your dermatologist needs to know about any problems that you experience while on this medication.  Please feel free to call at any time if you are in doubt about something, have a problem, or a question.    IPLEDGE is the program that monitors who receives Accutane.  You must register with IPLEDGE in order to receive Accutane.  Your dermatologist will perform the initial registration and a give you your IPLEDGE number.  Your pharmacist will need this number in order to dispense the medication to you.  IPLEDGE requires that you have two negative blood tests for pregnancy before you may be dispensed Accutane.  These two tests must be 30 days apart.  Every month you are taking Accutane, you will have to be seen by your doctor and have another blood test.  After this blood test, you will have seven days to pick up  your prescription.  If you do not pick up your prescription at this time, you will not be able to have Accutane for another month.  IPLEDGE requires that you answer a set of questions each month before you fill your prescription.  Your pharmacist will not be able to fill your prescription unless I[employer] confirms that you have answered your questions.  This may be done by phone, or by the [facility] website.  IPLEDGE will ask for your two forms of birth control.  Make sure that you tell them the same two forms that your doctor has entered into IPLEDGE.  You may choose abstinence as your form of birth control.  If you make this choice, the correct  way to fill out the form on the website is:  Method 1:  Abstinence Method 2:  None  If you have any questions regarding IPLEDGE, please call.  If the [facility] requirements are not satisfied correctly, there is no way that we can override it, and you will be without your medicine.    [employer]  [phone]

## 2020-12-23 IMAGING — MR MR BREAST BILAT WO/W CM
12 of 13 series · 33 of 48 positions shown · IV contrast (Multihance)
Comparison: Previous exam(s).

CLINICAL DATA: New dx dcis on lt, recent bx, seen on mammo, fam hx
mat grandmother age 50, Paternal gm And maternal aunt age 40's, Hx
of estrogen for 13 years

LABS:  No labs drawn at time of imaging.
EXAM:
BILATERAL BREAST MRI WITH AND WITHOUT CONTRAST
TECHNIQUE: Multiplanar, multisequence MR images of both breasts were obtained
prior to and following the intravenous administration of 7 ml of
Gadavist

[Series 2: t2_tirm_tra ipat (a-p) · axial · 3.0mm · 0.70mm/px · 1 of 55 slices shown]
[im 1/55]
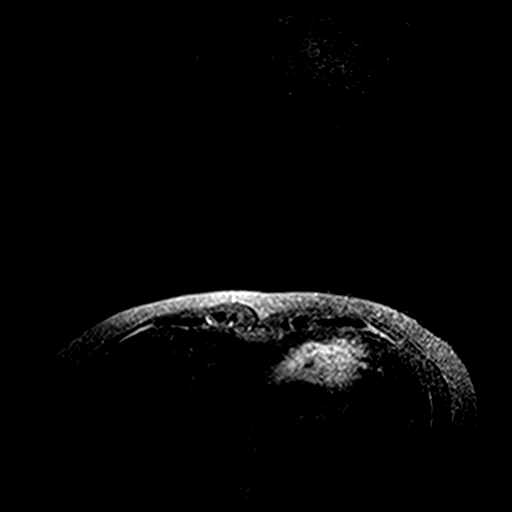

[Series 3: fl3d pre-cm no · axial · non-contrast · 1.2mm · 0.94mm/px · z∈[-92,+80]mm · 3 of 144 slices shown]
[im 1/144]
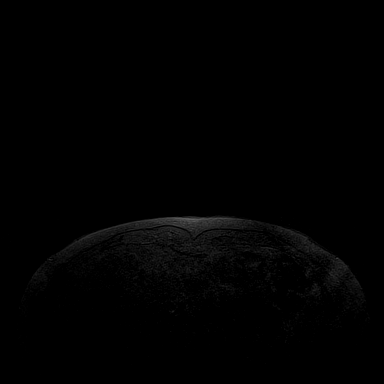
[im 72/144]
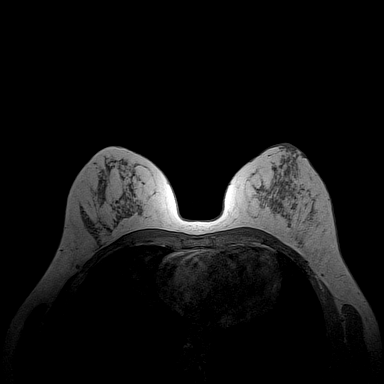
[im 144/144]
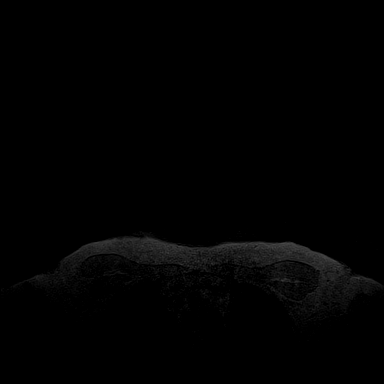

[Series 4: fl3d pre-cm · axial · non-contrast · 1.2mm · 0.94mm/px · z∈[-92,+80]mm · 3 of 144 slices shown]
[im 1/144]
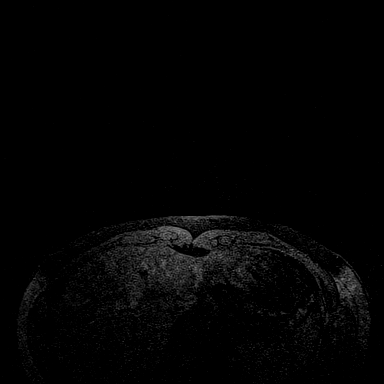
[im 72/144]
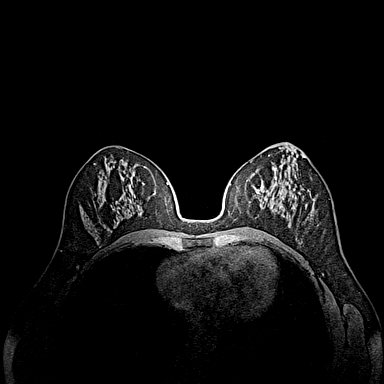
[im 144/144]
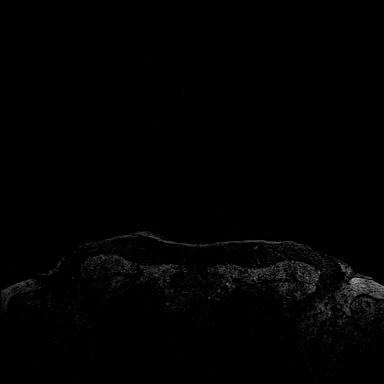

[Series 5: fl3d post-cm 20 · axial · 1.2mm · 0.94mm/px · z∈[-92,+80]mm · 3 of 144 slices shown (1 of 3)]
[im 1/144]
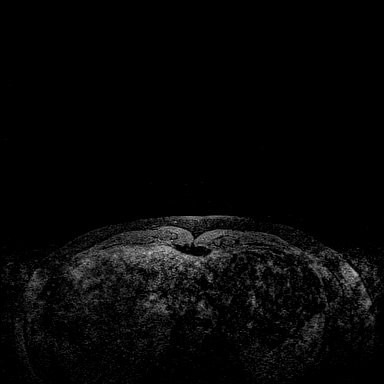
[im 72/144]
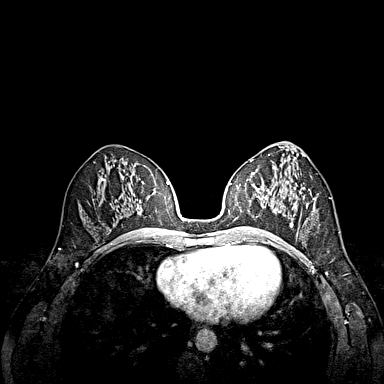
[im 144/144]
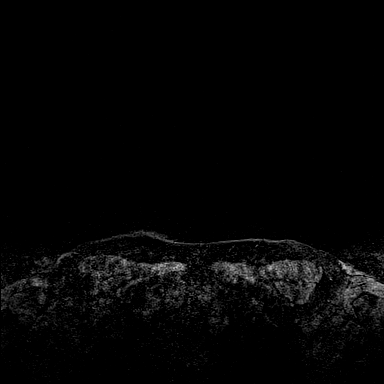

[Series 6: fl3d post-cm 20 · axial · 1.2mm · 0.94mm/px · z∈[-92,+80]mm · 4 of 144 slices shown (2 of 3)]
[im 1/144]
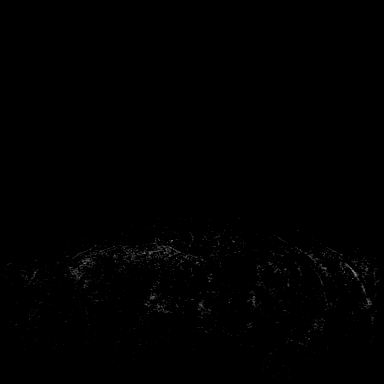
[im 48/144]
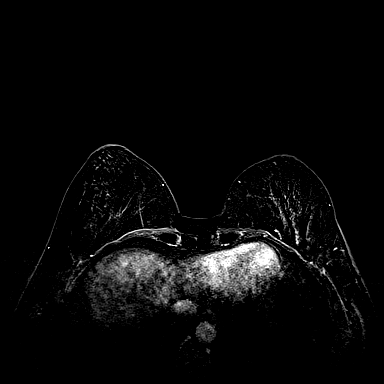
[im 96/144]
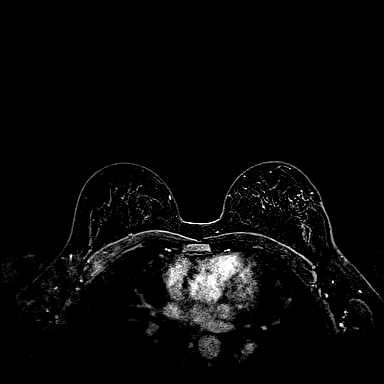
[im 144/144]
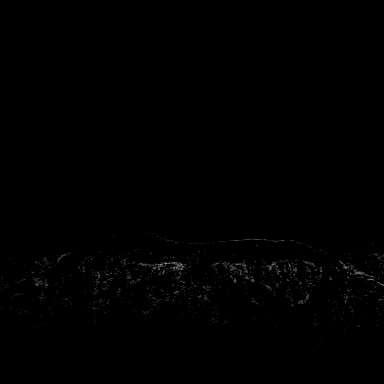

[Series 7: fl3d post-cm 20 · axial · 172.8mm · 0.94mm/px · 1 of 1 slices shown (3 of 3)]
[im 1/1]
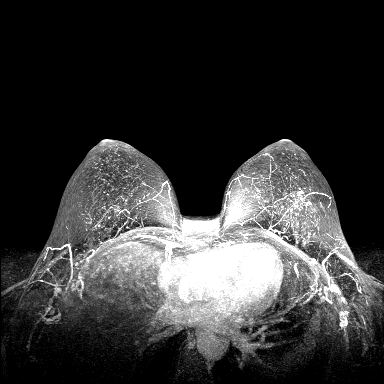

[Series 8: fl3d post-cm 3min · axial · 1.2mm · 0.94mm/px · z∈[-92,+80]mm · 4 of 144 slices shown]
[im 1/144]
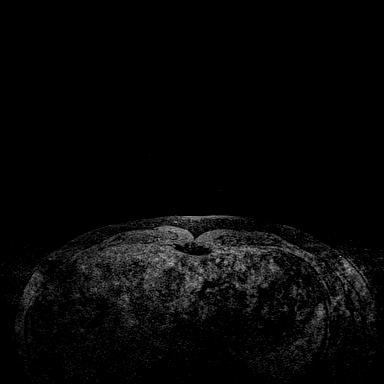
[im 48/144]
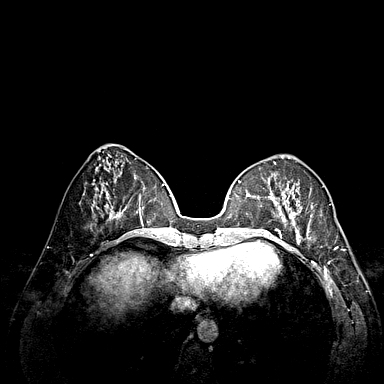
[im 96/144]
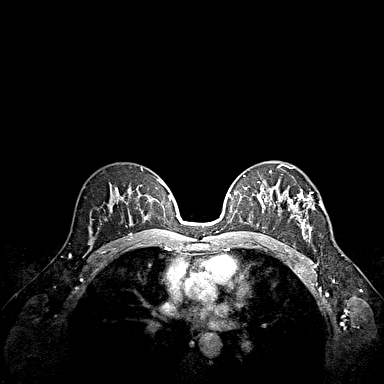
[im 144/144]
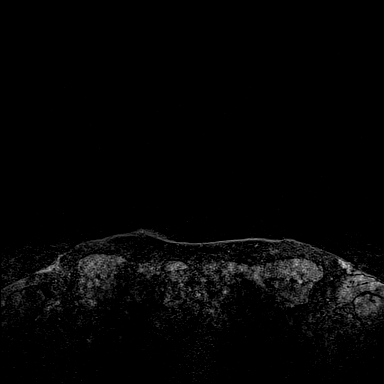

[Series 9: fl3d post-cm 3min_sub · axial · 1.2mm · 0.94mm/px · z∈[-92,+80]mm · 4 of 144 slices shown]
[im 1/144]
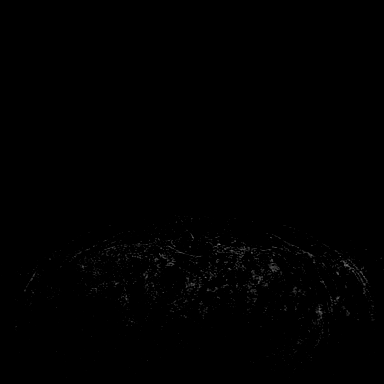
[im 48/144]
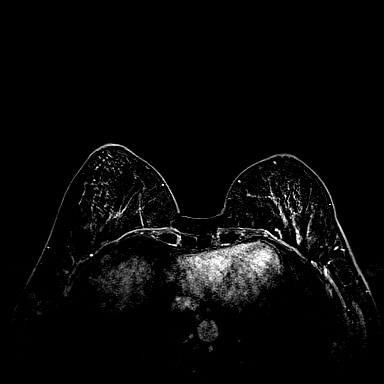
[im 96/144]
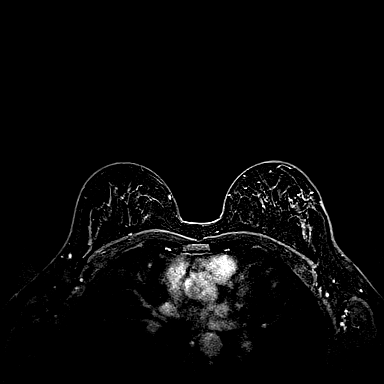
[im 144/144]
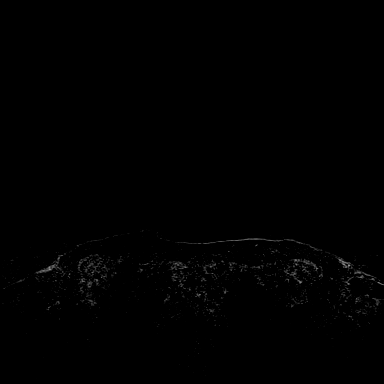

[Series 10: fl3d post-cm 3min_sub_mip_tra · axial · 172.8mm · 0.94mm/px · 1 of 1 slices shown]
[im 1/1]
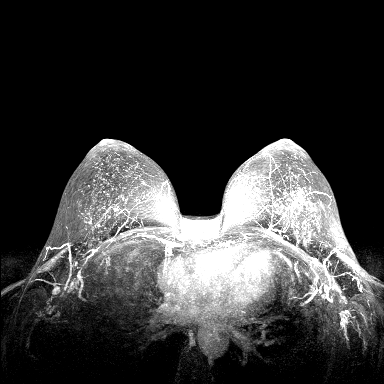

[Series 11: fl3d post-cm 5min · axial · 1.2mm · 0.94mm/px · z∈[-92,+80]mm · 4 of 144 slices shown]
[im 1/144]
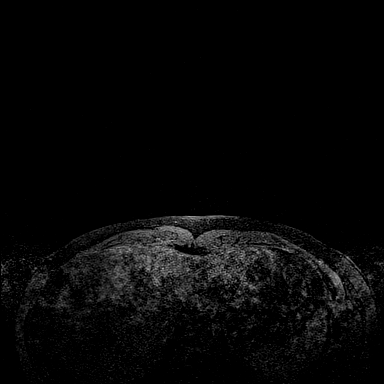
[im 48/144]
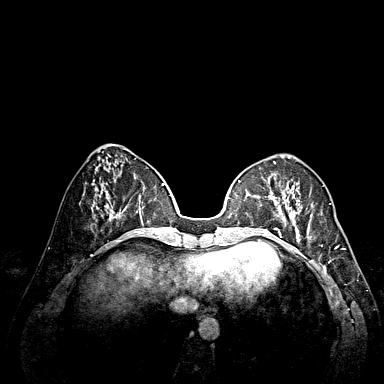
[im 96/144]
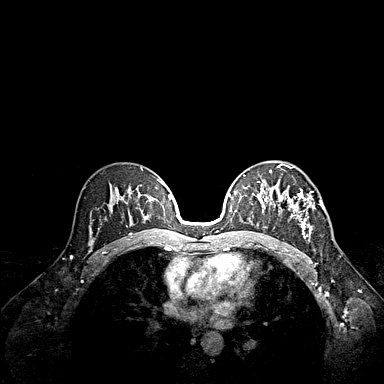
[im 144/144]
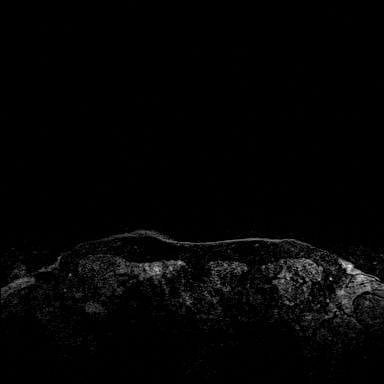

[Series 12: fl3d post-cm 5min_sub · axial · 1.2mm · 0.94mm/px · z∈[-92,+80]mm · 4 of 144 slices shown]
[im 1/144]
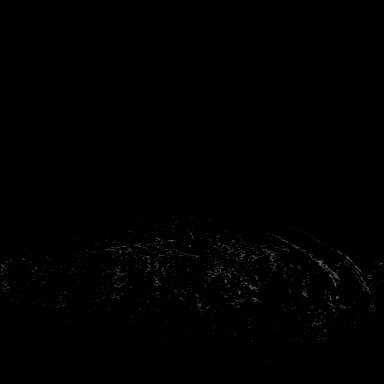
[im 48/144]
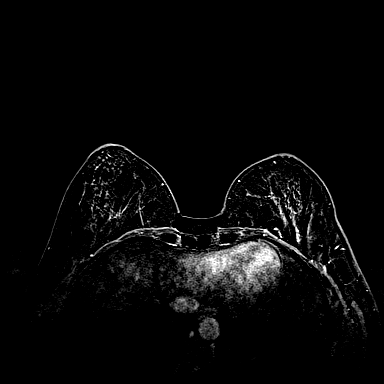
[im 96/144]
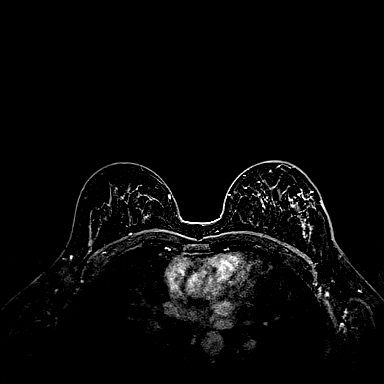
[im 144/144]
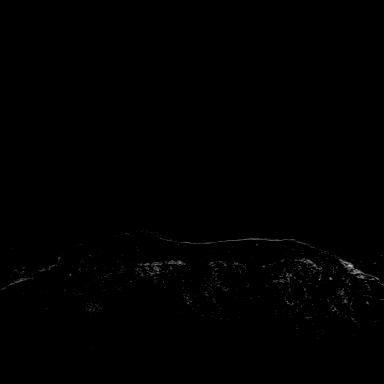

[Series 13: fl3d post-cm 5min_sub_mip_tra · axial · 172.8mm · 0.94mm/px · 1 of 1 slices shown]
[im 1/1]
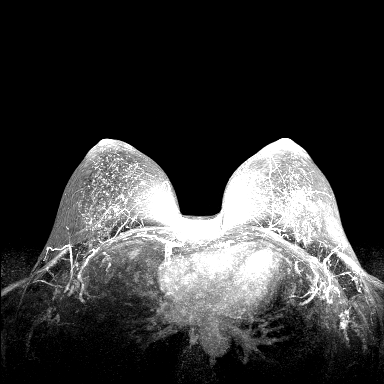

[33 of 48 positions shown; findings below may reference images not displayed]

Three-dimensional MR images were rendered by post-processing of the
original MR data on an independent workstation. The
three-dimensional MR images were interpreted, and findings are
reported in the following complete MRI report for this study. Three
dimensional images were evaluated at the independent [employer]
workstation
FINDINGS: Breast composition: c. Heterogeneous fibroglandular tissue.

Background parenchymal enhancement: Moderate.

Right breast: No mass or abnormal enhancement.

Left breast: Clumped non mass enhancement is seen in the left
breast, upper outer quadrant, surrounding the post biopsy clip
artifact, demonstrating mixed, predominantly wash-in and plateau,
but some washout trauma type kinetics. The area measures 2.6 x 2.3 x
2.5 cm.

There are no other areas of abnormal left breast enhancement.

Lymph nodes: No abnormal appearing lymph nodes.

Ancillary findings:  None.
IMPRESSION: 1. Biopsy-proven left breast DCIS reflected by clumped non mass
enhancement in the upper outer left breast surrounding the post
biopsy marker clip artifact, spanning 2.6 x 2.3 x 2.5 cm.
2. No other evidence of left breast malignancy.
3. No evidence of right breast malignancy.

RECOMMENDATION:
1. Treatment as planned for the known left breast carcinoma.

BI-RADS CATEGORY  6: Known biopsy-proven malignancy.

## 2021-02-06 ENCOUNTER — Other Ambulatory Visit: Payer: Self-pay

## 2021-02-06 ENCOUNTER — Encounter: Payer: Self-pay | Admitting: Physician Assistant

## 2021-02-06 ENCOUNTER — Ambulatory Visit: Payer: BC Managed Care – PPO | Admitting: Physician Assistant

## 2021-02-06 DIAGNOSIS — Z85820 Personal history of malignant melanoma of skin: Secondary | ICD-10-CM

## 2021-02-06 DIAGNOSIS — Z1283 Encounter for screening for malignant neoplasm of skin: Secondary | ICD-10-CM | POA: Diagnosis not present

## 2021-02-10 NOTE — Progress Notes (Signed)
° °  Follow-Up Visit   Subjective  Stephanie [name] is a 36 y.o. female who presents for the following: Annual Exam (No new concerns. Personal history of melanoma. Pt on tretinoin for ).   The following portions of the chart were reviewed this encounter and updated as appropriate:  Tobacco   Allergies   Meds   Problems   Med Hx   Surg Hx   Fam Hx       Objective  Well appearing patient in no apparent distress; mood and affect are within normal limits.  A full examination was performed including scalp, head, eyes, ears, nose, lips, neck, chest, axillae, abdomen, back, buttocks, bilateral upper extremities, bilateral lower extremities, hands, feet, fingers, toes, fingernails, and toenails. All findings within normal limits unless otherwise noted below.  Head to toe No atypical nevi No signs of non-mole skin cancer.  All scars clear. No lymphadenopathy.    Assessment & Plan  Screening for malignant neoplasm of skin Head to toe  Follow up 6-9 months.     I, Layn Kye, PA-C, have reviewed all documentation's for this visit.  The documentation on 02/10/21 for the exam, diagnosis, procedures and orders are all accurate and complete.

## 2021-08-06 ENCOUNTER — Encounter: Payer: Self-pay | Admitting: Physician Assistant

## 2021-08-06 ENCOUNTER — Ambulatory Visit: Payer: BC Managed Care – PPO | Admitting: Physician Assistant

## 2021-08-06 DIAGNOSIS — Z85820 Personal history of malignant melanoma of skin: Secondary | ICD-10-CM | POA: Diagnosis not present

## 2021-08-06 DIAGNOSIS — Z86018 Personal history of other benign neoplasm: Secondary | ICD-10-CM | POA: Diagnosis not present

## 2021-08-06 DIAGNOSIS — Z1283 Encounter for screening for malignant neoplasm of skin: Secondary | ICD-10-CM

## 2021-08-21 ENCOUNTER — Encounter: Payer: Self-pay | Admitting: Physician Assistant

## 2021-08-21 NOTE — Progress Notes (Signed)
   Follow-Up Visit   Subjective  Stephanie [name] is a 36 y.o. female who presents for the following: Follow-up (Patient here today for 6 month skin check no concerns. Personal history of atypical moles, and melanoma. No family history of atypical moles, melanoma or non mole skin cancer. ).   The following portions of the chart were reviewed this encounter and updated as appropriate:  Tobacco  Allergies  Meds  Problems  Med Hx  Surg Hx  Fam Hx      Objective  Well appearing patient in no apparent distress; mood and affect are within normal limits.  A full examination was performed including scalp, head, eyes, ears, nose, lips, neck, chest, axillae, abdomen, back, buttocks, bilateral upper extremities, bilateral lower extremities, hands, feet, fingers, toes, fingernails, and toenails. All findings within normal limits unless otherwise noted below.  Full body skin examination- No atypical nevi or signs of NMSC noted at the time of the visit.   Right Forearm Scar- clear   Assessment & Plan  Personal history of malignant melanoma of skin Right Forearm  Yearly skin examination   Encounter for screening for malignant neoplasm of skin  Yearly skin examination     I, Kauan Kloosterman, PA-C, have reviewed all documentation's for this visit.  The documentation on 08/21/21 for the exam, diagnosis, procedures and orders are all accurate and complete.

## 2021-10-20 ENCOUNTER — Other Ambulatory Visit: Payer: Self-pay | Admitting: Plastic Surgery

## 2021-10-20 DIAGNOSIS — N6320 Unspecified lump in the left breast, unspecified quadrant: Secondary | ICD-10-CM

## 2021-10-21 ENCOUNTER — Ambulatory Visit
Admission: RE | Admit: 2021-10-21 | Discharge: 2021-10-21 | Disposition: A | Discharge status: Home / Self Care | Payer: BC Managed Care – PPO | Source: Ambulatory Visit | Attending: Plastic Surgery | Admitting: Plastic Surgery

## 2021-10-21 ENCOUNTER — Other Ambulatory Visit: Payer: Self-pay | Admitting: Plastic Surgery

## 2021-10-21 DIAGNOSIS — N6320 Unspecified lump in the left breast, unspecified quadrant: Secondary | ICD-10-CM

## 2021-10-21 HISTORY — DX: Encounter for nonprocreative screening for genetic disease carrier status: Z13.71

## 2021-10-24 ENCOUNTER — Other Ambulatory Visit: Payer: Self-pay | Admitting: Plastic Surgery

## 2021-10-24 DIAGNOSIS — N6320 Unspecified lump in the left breast, unspecified quadrant: Secondary | ICD-10-CM

## 2021-10-29 ENCOUNTER — Other Ambulatory Visit: Payer: BC Managed Care – PPO

## 2021-10-29 ENCOUNTER — Ambulatory Visit
Admission: RE | Admit: 2021-10-29 | Discharge: 2021-10-29 | Disposition: A | Discharge status: Home / Self Care | Payer: BC Managed Care – PPO | Source: Ambulatory Visit | Attending: Plastic Surgery | Admitting: Plastic Surgery

## 2021-10-29 DIAGNOSIS — N6320 Unspecified lump in the left breast, unspecified quadrant: Secondary | ICD-10-CM

## 2021-11-03 ENCOUNTER — Telehealth: Payer: Self-pay | Admitting: Hematology and Oncology

## 2021-11-03 NOTE — Telephone Encounter (Signed)
Scheduled appt per 10/23 staff msg. Pt is aware of appt date and time. Pt is aware to arrive 15 mins prior to appt time and to bring and updated insurance card. Pt is aware of appt location.

## 2021-11-04 ENCOUNTER — Ambulatory Visit: Payer: Self-pay | Admitting: Surgery

## 2021-11-05 ENCOUNTER — Other Ambulatory Visit: Payer: Self-pay | Admitting: Surgery

## 2021-11-05 DIAGNOSIS — C50919 Malignant neoplasm of unspecified site of unspecified female breast: Secondary | ICD-10-CM

## 2021-11-07 ENCOUNTER — Other Ambulatory Visit: Payer: Self-pay | Admitting: *Deleted

## 2021-11-07 DIAGNOSIS — Z170 Estrogen receptor positive status [ER+]: Secondary | ICD-10-CM

## 2021-11-13 ENCOUNTER — Encounter: Payer: Self-pay | Admitting: *Deleted

## 2021-11-18 ENCOUNTER — Inpatient Hospital Stay: Payer: BC Managed Care – PPO | Attending: Hematology and Oncology | Admitting: Hematology and Oncology

## 2021-11-18 ENCOUNTER — Other Ambulatory Visit: Payer: Self-pay | Admitting: *Deleted

## 2021-11-18 ENCOUNTER — Inpatient Hospital Stay: Payer: BC Managed Care – PPO

## 2021-11-18 VITALS — BP 124/84 | HR 60 | Temp 97.9°F | Ht 66.0 in | Wt 155.1 lb

## 2021-11-18 DIAGNOSIS — Z5111 Encounter for antineoplastic chemotherapy: Secondary | ICD-10-CM | POA: Insufficient documentation

## 2021-11-18 DIAGNOSIS — Z170 Estrogen receptor positive status [ER+]: Secondary | ICD-10-CM | POA: Diagnosis not present

## 2021-11-18 DIAGNOSIS — Z79899 Other long term (current) drug therapy: Secondary | ICD-10-CM | POA: Diagnosis not present

## 2021-11-18 DIAGNOSIS — F419 Anxiety disorder, unspecified: Secondary | ICD-10-CM | POA: Diagnosis not present

## 2021-11-18 DIAGNOSIS — C50412 Malignant neoplasm of upper-outer quadrant of left female breast: Secondary | ICD-10-CM | POA: Diagnosis present

## 2021-11-18 LAB — CMP (CANCER CENTER ONLY)
ALT: 9 U/L (ref 0–44)
AST: 16 U/L (ref 15–41)
Albumin: 4.7 g/dL (ref 3.5–5.0)
Alkaline Phosphatase: 66 U/L (ref 38–126)
Anion gap: 8 (ref 5–15)
BUN: 17 mg/dL (ref 6–20)
CO2: 27 mmol/L (ref 22–32)
Calcium: 9.8 mg/dL (ref 8.9–10.3)
Chloride: 105 mmol/L (ref 98–111)
Creatinine: 0.98 mg/dL (ref 0.44–1.00)
GFR, Estimated: >60 mL/min (ref >60)
Glucose, Bld: 91 mg/dL (ref 70–99)
Potassium: 3.9 mmol/L (ref 3.5–5.1)
Sodium: 140 mmol/L (ref 135–145)
Total Bilirubin: 0.6 mg/dL (ref 0.3–1.2)
Total Protein: 7.6 g/dL (ref 6.5–8.1)

## 2021-11-18 LAB — CBC WITH DIFFERENTIAL (CANCER CENTER ONLY)
Abs Immature Granulocytes: 0.02 10*3/uL (ref 0.00–0.07)
Basophils Absolute: 0.1 10*3/uL (ref 0.0–0.1)
Basophils Relative: 1 %
Eosinophils Absolute: 0.1 10*3/uL (ref 0.0–0.5)
Eosinophils Relative: 1 %
HCT: 45.4 % (ref 36.0–46.0)
Hemoglobin: 14.8 g/dL (ref 12.0–15.0)
Immature Granulocytes: 0 %
Lymphocytes Relative: 25 %
Lymphs Abs: 2 10*3/uL (ref 0.7–4.0)
MCH: 26.5 pg (ref 26.0–34.0)
MCHC: 32.6 g/dL (ref 30.0–36.0)
MCV: 81.2 fL (ref 80.0–100.0)
Monocytes Absolute: 0.3 10*3/uL (ref 0.1–1.0)
Monocytes Relative: 4 %
Neutro Abs: 5.7 10*3/uL (ref 1.7–7.7)
Neutrophils Relative %: 69 %
Platelet Count: 276 10*3/uL (ref 150–400)
RBC: 5.59 MIL/uL — ABNORMAL HIGH (ref 3.87–5.11)
RDW: 14 % (ref 11.5–15.5)
WBC Count: 8.3 10*3/uL (ref 4.0–10.5)
nRBC: 0 % (ref 0.0–0.2)

## 2021-11-18 NOTE — Progress Notes (Unsigned)
[facility] NOTE  Patient Care Team: Sasser, Silvestre Moment, MD as PCP - General (Family Medicine) Erroll Luna, MD as Consulting Physician (General Surgery) Irene Limbo, MD as Consulting Physician (Plastic Surgery) Eppie Gibson, MD as Consulting Physician (Radiation Oncology) Rockwell Germany, RN as Oncology Nurse Navigator Mauro Kaufmann, RN as Oncology Nurse Navigator Azucena Fallen, MD as Consulting Physician (Obstetrics and Gynecology) Lavonna Monarch, MD (Inactive) as Consulting Physician (Dermatology) Azucena Fallen, MD as Consulting Physician (Obstetrics and Gynecology) Warren Danes, PA-C as Physician Assistant (Dermatology) Benay Pike, MD as Consulting Physician (Hematology and Oncology)  CHIEF COMPLAINTS/PURPOSE OF CONSULTATION:  Newly diagnosed breast cancer  HISTORY OF PRESENTING ILLNESS:  Stephanie [name] 36 y.o. female is here because of recent diagnosis of left breast cancer  I reviewed her records extensively and collaborated the history with the patient.  SUMMARY OF ONCOLOGIC HISTORY: Oncology History  Malignant neoplasm of upper-outer quadrant of female breast ([place]) (Resolved)  10/20/2018 Initial Diagnosis   Malignant neoplasm of upper-outer quadrant of left breast in female, estrogen receptor positive ([place])   10/20/2018 Cancer Staging   Staging form: Breast, AJCC 8th Edition - Clinical stage from 10/20/2018: Stage 0 (cTis (DCIS), cN0, cM0, ER+, PR-) - Signed by Chauncey Cruel, MD on 02/13/2020 Stage prefix: Initial diagnosis   11/03/2018 Genetic Testing   Negative genetic testing:  No pathogenic variants detected on the [facility] Multi-Cancer panel, ordered by Dr. Benjie Karvonen at [facility]. The report date is 11/03/2018.  The Multi-Cancer Panel offered by [facility] includes sequencing and/or deletion duplication testing of the following 84 genes: AIP, ALK, APC, ATM, AXIN2,BAP1,  BARD1, BLM, BMPR1A, BRCA1, BRCA2, BRIP1,  CASR, CDC73, CDH1, CDK4, CDKN1B, CDKN1C, CDKN2A (p14ARF), CDKN2A (p16INK4a), CEBPA, CHEK2, CTNNA1, DICER1, DIS3L2, EGFR (c.2369C>T, p.Thr790Met variant only), EPCAM (Deletion/duplication testing only), FH, FLCN, GATA2, GPC3, GREM1 (Promoter region deletion/duplication testing only), HOXB13 (c.251G>A, p.Gly84Glu), HRAS, KIT, MAX, MEN1, MET, MITF (c.952G>A, p.Glu318Lys variant only), MLH1, MSH2, MSH3, MSH6, MUTYH, NBN, NF1, NF2, NTHL1, PALB2, PDGFRA, PHOX2B, PMS2, POLD1, POLE, POT1, PRKAR1A, PTCH1, PTEN, RAD50, RAD51C, RAD51D, RB1, RECQL4, RET, RUNX1, SDHAF2, SDHA (sequence changes only), SDHB, SDHC, SDHD, SMAD4, SMARCA4, SMARCB1, SMARCE1, STK11, SUFU, TERC, TERT, TMEM127, TP53, TSC1, TSC2, VHL, WRN and WT1.     01/17/2019 Cancer Staging   Staging form: Breast, AJCC 8th Edition - Pathologic stage from 01/17/2019: Stage IA (pT57m, pN0, cM0, G2, ER+, PR-, HER2-) - Signed by CGardenia Phlegm NP on 02/01/2019   Malignant neoplasm of upper-outer quadrant of left breast in female, estrogen receptor positive ([place]  10/21/2021 Breast [place]  Breast ultrasound of the palpable mass showed hypoechoic oval vascular mass with indistinct margins measuring 1.5 x 1 cm x 1.4 cm.  No abnormal lymph nodes found in the left axilla.  She had ultrasound-guided biopsy.  MRI scheduled for tomorrow.   10/29/2021 Pathology Results   Surgical pathology from the left breast needle core biopsy at 130 o'clock showed invasive ductal carcinoma, overall grade 2, prognostics ER 70% positive weak to moderate staining, PR 0% negative, Ki-67 of 80% and HER2 3+   11/18/2021 Initial Diagnosis   Malignant neoplasm of upper-outer quadrant of left breast in female, estrogen receptor positive ([place]   11/18/2021 Cancer Staging   Staging form: Breast, AJCC 8th Edition - Clinical: Stage IA (cT1c, cN0, cM0, G2, ER+, PR-, HER2+) - Signed by IBenay Pike MD on 11/18/2021 Histologic grading system: 3 grade system    Patient arrived to the  appointment  today with her husband. 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  She is very healthy outside the history of DCIS.  Family history with significant breast cancer on both sides.    MEDICAL HISTORY:  Past Medical History:  Diagnosis Date   Atypical mole 03/14/2019   mod-left side superior   BRCA gene mutation negative in female    Breast cancer [facility])    Family history of breast cancer    Family history of lung cancer    Family history of rectal cancer    Melanoma ([place]) 10/14/2007   level II- right arm- ([facility])   PONV (postoperative nausea and vomiting)     SURGICAL HISTORY: Past Surgical History:  Procedure Laterality Date   BREAST RECONSTRUCTION WITH PLACEMENT OF TISSUE EXPANDER AND ALLODERM Bilateral 01/17/2019   Procedure: BILATERAL BREAST RECONSTRUCTION WITH PLACEMENT OF TISSUE EXPANDER AND ALLODERM;  Surgeon: Irene Limbo, MD;  Location: [facility];  Service: Plastics;  Laterality: Bilateral;   LESION REMOVAL Left 06/02/2019   Procedure: MINOR EXICISION OF LESION,LAYERED CLOSURE 1 CM;  Surgeon: Irene Limbo, MD;  Location: [facility];  Service: Plastics;  Laterality: Left;   LIPOSUCTION WITH LIPOFILLING Bilateral 06/02/2019   Procedure: LIPOFILLING FROM ABDOMEN TO BILATERAL CHEST;  Surgeon: Irene Limbo, MD;  Location: [facility];  Service: Plastics;  Laterality: Bilateral;   melanoma surgery Right 2009   upper right arm   NIPPLE SPARING MASTECTOMY WITH SENTINEL LYMPH NODE BIOPSY Bilateral 01/17/2019   Procedure: BILATERAL NIPPLE SPARING MASTECTOMIES WITH LEFT SENTINEL LYMPH NODE MAPPING;  Surgeon:  Erroll Luna, MD;  Location: [facility];  Service: General;  Laterality: Bilateral;   REMOVAL OF BILATERAL TISSUE EXPANDERS WITH PLACEMENT OF BILATERAL BREAST IMPLANTS Bilateral 06/02/2019   Procedure: REMOVAL OF BILATERAL TISSUE EXPANDERS WITH PLACEMENT OF BILATERAL SILICONE BREAST IMPLANTS;  Surgeon: Irene Limbo, MD;  Location: [facility];  Service: Plastics;  Laterality: Bilateral;   WISDOM TOOTH EXTRACTION      SOCIAL HISTORY: Social History   Socioeconomic History   Marital status: Married    Spouse name: Not on file   Number of children: Not on file   Years of education: Not on file   Highest education level: Not on file  Occupational History   Not on file  Tobacco Use   Smoking status: Never   Smokeless tobacco: Never  Vaping Use   Vaping Use: Never used  Substance and Sexual Activity   Alcohol use: Yes    Comment: occasional   Drug use: Never   Sexual activity: Yes    Birth control/protection: Pill  Other Topics Concern   Not on file  Social History Narrative   Not on file   Social Determinants of Health   Financial Resource Strain: Not on file  Food Insecurity: Not on file  Transportation Needs: No Transportation Needs (11/09/2018)   PRAPARE - Transportation    Lack of Transportation (Medical): No    Lack of Transportation (Non-Medical): No  Physical Activity: Not on file  Stress: Not on file  Social Connections: Not on file  Intimate Partner Violence: Not At Risk (11/09/2018)   Humiliation, Afraid, Rape, and  Kick questionnaire    Fear of Current or Ex-Partner: No    Emotionally Abused: No    Physically Abused: No    Sexually Abused: No    FAMILY HISTORY: Family History  Problem Relation Age of Onset   Hypertension Mother    Heart disease Father    Breast cancer Maternal Aunt 60 - 69   Breast cancer Maternal Aunt 4 - 59   Lung cancer Paternal Aunt        diagnosed late 80s   Breast cancer Maternal Grandmother  60 - 69   Breast cancer Paternal Grandmother 2 - 68   Lung cancer Paternal Grandmother        thought to be breast cancer mets   Rectal cancer Paternal Grandmother        diagnosed 19s   Lung cancer Paternal Grandfather        diagnosed 24s    ALLERGIES:  is allergic to oxycodone.  MEDICATIONS:  Current Outpatient Medications  Medication Sig Dispense Refill   cetirizine (ZYRTEC) 10 MG tablet Take 10 mg by mouth daily.      Clascoterone (WINLEVI) 1 % CREA Apply 1 application topically at bedtime. 60 g 6   escitalopram (LEXAPRO) 5 MG tablet Take 5 mg by mouth daily. (Patient not taking: Reported on 08/06/2020)     MECLIZINE HCL PO Take by mouth as needed. Vertigo     Multiple Vitamin (MULTIVITAMIN) capsule Take 1 capsule by mouth daily.     SLYND 4 MG TABS  (Patient not taking: Reported on 02/06/2021)     No current facility-administered medications for this visit.    REVIEW OF SYSTEMS:   Constitutional: Denies fevers, chills or abnormal night sweats Eyes: Denies blurriness of vision, double vision or watery eyes Ears, nose, mouth, throat, and face: Denies mucositis or sore throat Respiratory: Denies cough, dyspnea or wheezes Cardiovascular: Denies palpitation, chest discomfort or lower extremity swelling Gastrointestinal:  Denies nausea, heartburn or change in bowel habits Skin: Denies abnormal skin rashes Lymphatics: Denies new lymphadenopathy or easy bruising Neurological:Denies numbness, tingling or new weaknesses Behavioral/Psych: Mood is stable, no new changes  Breast:  Denies any palpable lumps or discharge All other systems were reviewed with the patient and are negative.  PHYSICAL EXAMINATION: ECOG PERFORMANCE STATUS: 0 - Asymptomatic  Vitals:   11/18/21 1014  BP: 124/84  Pulse: 60  Temp: 97.9 F (36.6 C)  SpO2: 100%   Filed Weights   11/18/21 1014  Weight: 155 lb 1.6 oz (70.4 kg)    GENERAL:alert, no distress and comfortable SKIN: skin color, texture,  turgor are normal, no rashes or significant lesions EYES: normal, conjunctiva are pink and non-injected, sclera clear OROPHARYNX:no exudate, no erythema and lips, buccal mucosa, and tongue normal  NECK: supple, thyroid normal size, non-tender, without nodularity LYMPH:  no palpable lymphadenopathy in the cervical, axillary or inguinal LUNGS: clear to auscultation and percussion with normal breathing effort HEART: regular rate & rhythm and no murmurs and no lower extremity edema ABDOMEN:abdomen soft, non-tender and normal bowel sounds Musculoskeletal:no cyanosis of digits and no clubbing  PSYCH: alert & oriented x 3 with fluent speech NEURO: no focal motor/sensory deficits BREAST: Palpable left breast mass upper outer quadrant measuring about 2-1/2 cm in largest dimension.  There is some palpable abnormal density in the left axilla however I am unable to clearly distinguish if this is pathologic lymphadenopathy.  Rest of the breasts bilaterally appear normal. LABORATORY DATA:  I have reviewed the data as  listed Lab Results  Component Value Date   WBC 8.3 11/18/2021   HGB 14.8 11/18/2021   HCT 45.4 11/18/2021   MCV 81.2 11/18/2021   PLT 276 11/18/2021   Lab Results  Component Value Date   NA 140 11/18/2021   K 3.9 11/18/2021   CL 105 11/18/2021   CO2 27 11/18/2021    RADIOGRAPHIC STUDIES: I have personally reviewed the radiological reports and agreed with the findings in the report.  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  Patient arrived to the appointment today with her.  Although ultrasound demonstrated tumor under 2 cm, on exam the mass certainly appears to be greater than 2 cm.  Patient also feels that the breast mass has grown compared to her last ultrasound back in October.  Since her imaging, she was on vacation for the past 2 weeks.  She also feels some tenderness in the left axilla.  There is an abnormal density in this axilla but unsure if this is a pathological lymphadenopathy versus residual scar from prior surgery.  We have today discussed about proceeding with MRI as already scheduled for tomorrow.  I think it is reasonable to consider restaging scans given recurrence, PET/CT reordered, this was previously denied.  Then given physical exam demonstrating tumor greater than 2 cm I think it is best to proceed with neoadjuvant TCHP.  We have discussed about the schedule which is every 3 weeks for 6 cycles followed by surgery and then adjuvant Herceptin and Perjeta for for whole 1 year.  Have discussed about adverse effects of chemotherapy including but not limited to fatigue, nausea, vomiting, diarrhea, increased risk of infections, neuropathy, cardiotoxicity etc.  She understands that some of the side effects can be permanent and life-threatening.  She will be a candidate for adjuvant tamoxifen after completion of local therapy. She is agreeable to what ever recommendations we will come up at the breast tumor board.  I think she will have a great response with neoadjuvant therapy.  She already had genetic testing which was negative. Thank you for consulting [place] the care of this patient.  Please not hesitate to contact us with any additional questions or concerns.  Total time spent 60 minutes including history, physical exam, review of records, counseling and coordination of care All questions were answered. The patient knows to call the clinic with any problems, questions or concerns.    Benay Pike, MD 11/18/21

## 2021-11-18 NOTE — Assessment & Plan Note (Signed)
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  Patient arrived to the appointment today with her.  Although ultrasound demonstrated tumor under 2 cm, on exam the mass certainly appears to be greater than 2 cm.  Patient also feels that the breast mass has grown compared to her last ultrasound back in October.  Since her imaging, she was on vacation for the past 2 weeks.  She also feels some tenderness in the left axilla.  There is an abnormal density in this axilla but unsure if this is a pathological lymphadenopathy versus residual scar from prior surgery.  We have today discussed about proceeding with MRI as already scheduled for tomorrow.  I think it is reasonable to consider restaging scans given recurrence, PET/CT reordered, this was previously denied.  Then given physical exam demonstrating tumor greater than 2 cm I think it is best to proceed with neoadjuvant TCHP.  We have discussed about the schedule which is every 3 weeks for 6 cycles followed by surgery and then adjuvant Herceptin and Perjeta for for whole 1 year.  Have discussed about adverse effects of chemotherapy including but not limited to fatigue, nausea, vomiting, diarrhea, increased risk of infections, neuropathy, cardiotoxicity etc.  She understands that some of the side effects can be permanent and life-threatening.  She will be a candidate for adjuvant tamoxifen after completion of local therapy. She is agreeable to what ever recommendations we will come up at the breast tumor board.  I think she will have a great response with neoadjuvant therapy.  She already had genetic testing which was  negative. Thank you for consulting [place] the care of this patient.  Please not hesitate to contact us with any additional questions or concerns.

## 2021-11-19 ENCOUNTER — Other Ambulatory Visit: Payer: Self-pay | Admitting: *Deleted

## 2021-11-19 ENCOUNTER — Encounter: Payer: Self-pay | Admitting: Hematology and Oncology

## 2021-11-19 ENCOUNTER — Ambulatory Visit
Admission: RE | Admit: 2021-11-19 | Discharge: 2021-11-19 | Disposition: A | Discharge status: Home / Self Care | Payer: BC Managed Care – PPO | Source: Ambulatory Visit | Attending: Surgery | Admitting: Surgery

## 2021-11-19 ENCOUNTER — Encounter (HOSPITAL_COMMUNITY): Payer: BC Managed Care – PPO

## 2021-11-19 DIAGNOSIS — Z170 Estrogen receptor positive status [ER+]: Secondary | ICD-10-CM

## 2021-11-19 DIAGNOSIS — C50919 Malignant neoplasm of unspecified site of unspecified female breast: Secondary | ICD-10-CM

## 2021-11-19 DIAGNOSIS — M7918 Myalgia, other site: Secondary | ICD-10-CM

## 2021-11-19 MED ORDER — GADOPICLENOL 0.5 MMOL/ML IV SOLN
7.0000 mL | Freq: Once | INTRAVENOUS | Status: AC | PRN
  Administered 2021-11-19: 7 mL via INTRAVENOUS

## 2021-11-19 NOTE — Progress Notes (Signed)
START ON PATHWAY REGIMEN - Breast     Cycle 1: A cycle is 21 days:     Pertuzumab      Trastuzumab-xxxx      Docetaxel      Carboplatin    Cycles 2 through 6: A cycle is every 21 days:     Pertuzumab      Trastuzumab-xxxx      Docetaxel      Carboplatin   **Always confirm dose/schedule in your pharmacy ordering system**  Patient Characteristics: Preoperative or Nonsurgical Candidate (Clinical Staging), Neoadjuvant Therapy followed by Surgery, Invasive Disease, Chemotherapy, HER2 Positive, ER Positive Therapeutic Status: Preoperative or Nonsurgical Candidate (Clinical Staging) AJCC M Category: cM0 AJCC Grade: G2 Breast Surgical Plan: Neoadjuvant Therapy followed by Surgery ER Status: Positive (+) AJCC 8 Stage Grouping: IIA HER2 Status: Positive (+) AJCC T Category: cT2 AJCC N Category: cN0 PR Status: Negative (-) Intent of Therapy: Curative Intent, Discussed with Patient

## 2021-11-19 NOTE — Telephone Encounter (Signed)
This RN called pt and verified what form of birth control she is currently on due to noted flags when entering orders- she states presently she is not on any birth control - previously was on Slynd - but off now over 1 year.  Noted as pt wanting medication discontinued on med list - proceeded with removal per confirmation.

## 2021-11-20 ENCOUNTER — Other Ambulatory Visit: Payer: Self-pay

## 2021-11-20 ENCOUNTER — Telehealth: Payer: Self-pay | Admitting: *Deleted

## 2021-11-20 ENCOUNTER — Encounter: Payer: Self-pay | Admitting: *Deleted

## 2021-11-20 ENCOUNTER — Other Ambulatory Visit: Payer: Self-pay | Admitting: Radiology

## 2021-11-20 NOTE — Telephone Encounter (Signed)
This RN per need to cancel PET per insurance denying and recommended CT and bone scan-rescheduled to 11/22.  IR at [facility] will place port on 11/14 at noon (pt to check in at 10 am).  Appointments above as well as for visit and treatment reviewed with patient ( including prep for port placement)  Informed pt concern for Chemo education- and likely need to reschedule due to port placement.  This RN will follow up with pt per chemo education and how it can be done.

## 2021-11-24 ENCOUNTER — Other Ambulatory Visit: Payer: Self-pay | Admitting: *Deleted

## 2021-11-24 ENCOUNTER — Other Ambulatory Visit: Payer: Self-pay

## 2021-11-24 ENCOUNTER — Inpatient Hospital Stay: Payer: BC Managed Care – PPO

## 2021-11-24 DIAGNOSIS — Z170 Estrogen receptor positive status [ER+]: Secondary | ICD-10-CM

## 2021-11-24 DIAGNOSIS — Z5111 Encounter for antineoplastic chemotherapy: Secondary | ICD-10-CM

## 2021-11-24 MED ORDER — ONDANSETRON HCL 8 MG PO TABS: 8.0000 mg | ORAL_TABLET | Freq: Three times a day (TID) | ORAL | 1 refills | Status: DC | PRN

## 2021-11-24 MED ORDER — LIDOCAINE-PRILOCAINE 2.5-2.5 % EX CREA: TOPICAL_CREAM | CUTANEOUS | 3 refills | Status: DC

## 2021-11-24 MED ORDER — PROCHLORPERAZINE MALEATE 10 MG PO TABS: 10.0000 mg | ORAL_TABLET | Freq: Four times a day (QID) | ORAL | 1 refills | Status: DC | PRN

## 2021-11-24 MED ORDER — DEXAMETHASONE 4 MG PO TABS: ORAL_TABLET | ORAL | 1 refills | Status: DC

## 2021-11-24 NOTE — Progress Notes (Signed)
At home meds sent to pt's local pharmacy

## 2021-11-25 ENCOUNTER — Ambulatory Visit (HOSPITAL_BASED_OUTPATIENT_CLINIC_OR_DEPARTMENT_OTHER)
Admission: RE | Admit: 2021-11-25 | Discharge: 2021-11-25 | Disposition: A | Discharge status: Home / Self Care | Payer: BC Managed Care – PPO | Source: Ambulatory Visit | Attending: Hematology and Oncology | Admitting: Hematology and Oncology

## 2021-11-25 ENCOUNTER — Other Ambulatory Visit: Payer: BC Managed Care – PPO

## 2021-11-25 ENCOUNTER — Other Ambulatory Visit: Payer: Self-pay

## 2021-11-25 ENCOUNTER — Other Ambulatory Visit: Payer: Self-pay | Admitting: Hematology and Oncology

## 2021-11-25 ENCOUNTER — Ambulatory Visit (HOSPITAL_COMMUNITY)
Admission: RE | Admit: 2021-11-25 | Discharge: 2021-11-25 | Disposition: A | Discharge status: Home / Self Care | Payer: BC Managed Care – PPO | Source: Ambulatory Visit | Attending: Hematology and Oncology | Admitting: Hematology and Oncology

## 2021-11-25 VITALS — BP 118/66 | HR 89 | Temp 97.8°F | Resp 17 | Ht 66.0 in | Wt 150.0 lb

## 2021-11-25 DIAGNOSIS — Z5111 Encounter for antineoplastic chemotherapy: Secondary | ICD-10-CM

## 2021-11-25 DIAGNOSIS — Z0189 Encounter for other specified special examinations: Secondary | ICD-10-CM | POA: Diagnosis not present

## 2021-11-25 DIAGNOSIS — C50412 Malignant neoplasm of upper-outer quadrant of left female breast: Secondary | ICD-10-CM

## 2021-11-25 DIAGNOSIS — Z170 Estrogen receptor positive status [ER+]: Secondary | ICD-10-CM | POA: Diagnosis not present

## 2021-11-25 DIAGNOSIS — C50919 Malignant neoplasm of unspecified site of unspecified female breast: Secondary | ICD-10-CM | POA: Insufficient documentation

## 2021-11-25 HISTORY — PX: IR IMAGING GUIDED PORT INSERTION: IMG5740

## 2021-11-25 LAB — ECHOCARDIOGRAM COMPLETE
Area-P 1/2: 4.15 cm2
S' Lateral: 2.8 cm

## 2021-11-25 LAB — PREGNANCY, URINE: Preg Test, Ur: NEGATIVE

## 2021-11-25 MED ORDER — FENTANYL CITRATE (PF) 100 MCG/2ML IJ SOLN
INTRAMUSCULAR | Status: AC
  Filled 2021-11-25: qty 2

## 2021-11-25 MED ORDER — FENTANYL CITRATE (PF) 100 MCG/2ML IJ SOLN
INTRAMUSCULAR | Status: AC | PRN
  Administered 2021-11-25 (×2): 25 ug via INTRAVENOUS

## 2021-11-25 MED ORDER — SODIUM CHLORIDE 0.9 % IV SOLN: INTRAVENOUS | Status: DC

## 2021-11-25 MED ORDER — HEPARIN SOD (PORK) LOCK FLUSH 100 UNIT/ML IV SOLN
INTRAVENOUS | Status: AC
  Administered 2021-11-25: 500 [IU]
  Filled 2021-11-25: qty 5

## 2021-11-25 MED ORDER — MIDAZOLAM HCL 2 MG/2ML IJ SOLN
INTRAMUSCULAR | Status: AC | PRN
  Administered 2021-11-25: 1 mg via INTRAVENOUS

## 2021-11-25 MED ORDER — LIDOCAINE-EPINEPHRINE 1 %-1:100000 IJ SOLN
INTRAMUSCULAR | Status: AC
  Administered 2021-11-25: 15 mL
  Filled 2021-11-25: qty 1

## 2021-11-25 MED ORDER — MIDAZOLAM HCL 2 MG/2ML IJ SOLN
INTRAMUSCULAR | Status: AC
  Filled 2021-11-25: qty 2

## 2021-11-25 MED ORDER — ONDANSETRON HCL 4 MG/2ML IJ SOLN
INTRAMUSCULAR | Status: AC
  Filled 2021-11-25: qty 2

## 2021-11-25 MED FILL — Fosaprepitant Dimeglumine For IV Infusion 150 MG (Base Eq): INTRAVENOUS | Qty: 5 | Status: AC

## 2021-11-25 MED FILL — Dexamethasone Sodium Phosphate Inj 100 MG/10ML: INTRAMUSCULAR | Qty: 1 | Status: AC

## 2021-11-25 NOTE — Progress Notes (Signed)
The pt and spouse received d/c instructions, written and verbal. All questions and concerns addressed. PIV removed and intact. Surgical sites and dressings remain clean and dry. No bleeding noted. Pt denies any acute pain or discomfort. Remains in stable condition.

## 2021-11-25 NOTE — Procedures (Signed)
Vascular and Interventional Radiology Procedure Note  Patient: Stephanie [name] DOB: 05/26/1985 Medical Record Number: [id number] Note Date/Time: 11/25/21 12:22 PM   Performing Physician: Michaelle Birks, MD Assistant(s): None  Diagnosis: Breast cancer  Procedure: PORT PLACEMENT  Anesthesia: Conscious Sedation Complications: None Estimated Blood Loss: Minimal  Findings:  Successful right-sided port placement, with the tip of the catheter in the proximal right atrium.  Plan: Catheter ready for use.  See detailed procedure note with images in PACS. The patient tolerated the procedure well without incident or complication and was returned to Recovery in stable condition.    Michaelle Birks, MD [facility] Radiology   Pager. [phone]

## 2021-11-25 NOTE — H&P (Signed)
Chief Complaint: Patient was seen in consultation today for port-a-catheter placement  Referring Physician(s): Iruku,Praveena  Supervising Physician: Michaelle Birks  Patient Status: [facility] - Out-pt  History of Present Illness: Heydi Renee Behring is a 36 y.o. female with a medical history significant for breast cancer. 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 She underwent bilateral mastectomies with reconstruction. A few months ago she palpated a lump and had axillary tenderness. 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 Her oncology team is preparing her for chemotherapy.   Interventional Radiology has been asked to evaluate this patient for an image-guided port-a-catheter placement to facilitate her treatment plans.   Past Medical History:  Diagnosis Date   Atypical mole 03/14/2019   mod-left side superior   BRCA gene mutation negative in female    Breast cancer [facility])    Family history of breast cancer    Family history of lung cancer    Family history of rectal cancer    Melanoma ([place]) 10/14/2007   level II- right arm- ([facility])   PONV (postoperative nausea and vomiting)     Past Surgical History:  Procedure Laterality Date   BREAST RECONSTRUCTION WITH PLACEMENT OF TISSUE EXPANDER AND ALLODERM Bilateral 01/17/2019   Procedure: BILATERAL BREAST RECONSTRUCTION WITH PLACEMENT OF TISSUE EXPANDER AND ALLODERM;  Surgeon: Irene Limbo, MD;  Location: [place];  Service: Plastics;  Laterality: Bilateral;   LESION REMOVAL Left 06/02/2019   Procedure: MINOR EXICISION OF LESION,LAYERED CLOSURE 1 CM;  Surgeon: Irene Limbo, MD;  Location: [place];  Service: Plastics;  Laterality: Left;   LIPOSUCTION WITH LIPOFILLING Bilateral 06/02/2019   Procedure: LIPOFILLING FROM ABDOMEN TO BILATERAL CHEST;  Surgeon: Irene Limbo, MD;  Location: [place];  Service: Plastics;  Laterality: Bilateral;   melanoma surgery Right 2009   upper right arm   NIPPLE SPARING MASTECTOMY WITH SENTINEL LYMPH NODE BIOPSY Bilateral 01/17/2019   Procedure: BILATERAL NIPPLE SPARING MASTECTOMIES WITH LEFT SENTINEL LYMPH NODE MAPPING;  Surgeon: Erroll Luna, MD;  Location: [place];  Service: General;  Laterality: Bilateral;   REMOVAL OF BILATERAL TISSUE EXPANDERS WITH PLACEMENT OF BILATERAL BREAST IMPLANTS Bilateral 06/02/2019   Procedure: REMOVAL OF BILATERAL TISSUE EXPANDERS WITH PLACEMENT OF BILATERAL SILICONE BREAST IMPLANTS;  Surgeon: Irene Limbo, MD;  Location: [place];  Service: Plastics;  Laterality: Bilateral;   WISDOM TOOTH EXTRACTION      Allergies: Oxycodone  Medications: Prior to Admission medications   Medication Sig Start Date End Date Taking? Authorizing Provider  cetirizine (ZYRTEC) 10 MG tablet Take 10 mg by mouth daily.    Yes Joline Salt, RN  meclizine (ANTIVERT) 25 MG tablet Take 25 mg by mouth 3 (three) times daily as needed (Vertigo/Motion Sickness).   Yes [provider]  Multiple Vitamins-Minerals (MULTIVITAMIN WITH MINERALS) tablet Take 1 tablet by mouth daily.   Yes [provider]  dexamethasone (DECADRON) 4 MG tablet Take 2 tabs by mouth 2 times daily starting day before chemo. Then take 2 tabs daily for 2 days starting day after chemo. Take with food. 11/24/21   Benay Pike, MD  lidocaine-prilocaine (EMLA) cream Apply to affected area once 11/24/21   Benay Pike, MD  ondansetron (ZOFRAN) 8 MG tablet Take 1 tablet (8 mg total) by mouth every 8 (eight) hours as needed for nausea or vomiting. Start on the third day after chemotherapy. 11/24/21  Benay Pike, MD  prochlorperazine (COMPAZINE) 10 MG tablet Take 1 tablet (10 mg total) by mouth every 6 (six) hours as needed for nausea or vomiting. 11/24/21   Benay Pike, MD     Family History  Problem  Relation Age of Onset   Hypertension Mother    Heart disease Father    Breast cancer Maternal Aunt 60 - 69   Breast cancer Maternal Aunt 87 - 59   Lung cancer Paternal Aunt        diagnosed late 76s   Breast cancer Maternal Grandmother 60 - 69   Breast cancer Paternal Grandmother 42 - 68   Lung cancer Paternal Grandmother        thought to be breast cancer mets   Rectal cancer Paternal Grandmother        diagnosed 24s   Lung cancer Paternal Grandfather        diagnosed 56s    Social History   Socioeconomic History   Marital status: Married    Spouse name: Not on file   Number of children: Not on file   Years of education: Not on file   Highest education level: Not on file  Occupational History   Not on file  Tobacco Use   Smoking status: Never   Smokeless tobacco: Never  Vaping Use   Vaping Use: Never used  Substance and Sexual Activity   Alcohol use: Yes    Comment: occasional   Drug use: Never   Sexual activity: Yes    Birth control/protection: Pill  Other Topics Concern   Not on file  Social History Narrative   Not on file   Social Determinants of Health   Financial Resource Strain: Not on file  Food Insecurity: Not on file  Transportation Needs: No Transportation Needs (11/09/2018)   PRAPARE - Hydrologist (Medical): No    Lack of Transportation (Non-Medical): No  Physical Activity: Not on file  Stress: Not on file  Social Connections: Not on file    Review of Systems: A 12 point ROS discussed and pertinent positives are indicated in the HPI above.  All other systems are negative.  Review of Systems  Constitutional:  Negative for appetite change and fatigue.  Respiratory:  Negative for cough and shortness of breath.   Cardiovascular:  Negative for chest pain and leg swelling.  Gastrointestinal:  Negative for abdominal pain, diarrhea, nausea and vomiting.  Neurological:  Negative for dizziness and headaches.    Vital  Signs: BP 121/79   Pulse 70   Temp 97.8 F (36.6 C) (Temporal)   Resp 17   Ht _0  (1.676 m)   Wt 150 lb (68 kg)   SpO2 99%   BMI 24.21 kg/m   Physical Exam Constitutional:      General: She is not in acute distress.    Appearance: Normal appearance. She is not ill-appearing.  HENT:     Mouth/Throat:     Mouth: Mucous membranes are moist.     Pharynx: Oropharynx is clear.  Cardiovascular:     Rate and Rhythm: Normal rate and regular rhythm.     Pulses: Normal pulses.     Heart sounds: Normal heart sounds.  Pulmonary:     Effort: Pulmonary effort is normal.     Breath sounds: Normal breath sounds.  Abdominal:     General: Bowel sounds are normal.     Palpations: Abdomen is soft.     Tenderness: There  is no abdominal tenderness.  Musculoskeletal:     Right lower leg: No edema.     Left lower leg: No edema.  Skin:    General: Skin is warm and dry.  Neurological:     Mental Status: She is alert and oriented to person, place, and time.     Imaging: MR BREAST BILATERAL W WO CONTRAST INC CAD  Result Date: 11/19/2021 CLINICAL DATA:  Recently diagnosed grade 2 invasive ductal carcinoma with lymphovascular invasion in the 1:30 o'clock position of the left breast, marked with a spiral shaped HydroMARK biopsy marker clip. 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 EXAM: BILATERAL BREAST MRI WITH AND WITHOUT CONTRAST TECHNIQUE: Multiplanar, multisequence MR images of both breasts were obtained prior to and following the intravenous administration of ml of Gadavist Three-dimensional MR images were rendered by post-processing of the original MR data on an independent workstation. The three-dimensional MR images were interpreted, and findings are reported in the following complete MRI report for this study. Three dimensional images were evaluated at the independent  interpreting workstation using the DynaCAD thin client. COMPARISON:  Left breast ultrasound dated 10/21/2021. Left breast ultrasound-guided core needle biopsy and postprocedure mammogram dated 10/29/2021. Bilateral breast MRI dated 11/02/2018. FINDINGS: Breast composition: c. Heterogeneous fibroglandular tissue. Background parenchymal enhancement: Mild Right breast: Status post subcutaneous mastectomy with prepectoral implant reconstruction. No mass or abnormal enhancement. Left breast: Status post subcutaneous mastectomy with prepectoral implant reconstruction. Irregular enhancing mass with a broad base against the patient's implant in the upper-outer quadrant of the left breast. This contains a biopsy marker clip artifact and corresponds to the recently diagnosed malignancy. This measures 3.8 x 2.0 cm maximum dimensions in the sagittal plane and 1.6 cm in width. This has a mixture of enhancement kinetics, including rapid wash-in/washout. Lymph nodes: No abnormal appearing lymph nodes. Ancillary findings:  None. IMPRESSION: 1. 3.8 x 2.0 x 1.6 cm biopsy-proven malignancy in the upper-outer quadrant of the left breast. This has a broad base against the patient's implant. 2. No evidence of malignancy on the right and no adenopathy. RECOMMENDATION: Treatment plan. BI-RADS CATEGORY  6: Known biopsy-proven malignancy. Electronically Signed   By: Claudie Revering M.D.   On: 11/19/2021 16:47  [place] LT BREAST BX W LOC DEV 1ST LESION IMG BX SPEC US GUIDE  Addendum Date: 11/04/2021   ADDENDUM REPORT: 11/04/2021 07:56 ADDENDUM: Pathology revealed: GRADE 2 INVASIVE DUCTAL CARCINOMA, LYMPHOVASCULAR INVASION: PRESENT of the LEFT breast, 1:30 o'clock, 6 cmfn, upper outer quadrant (Hydromark spiral clip). This was found to be concordant by Dr. Peggye Fothergill. Pathology results were discussed with the patient by telephone. The patient reported doing well after the biopsy with tenderness at the site. Post biopsy instructions and care  were reviewed and questions were answered. The patient was encouraged to call [facility] for any additional concerns. Surgical consultation has been arranged with Dr. Erroll Luna at [facility] on October 30 or sooner. Pathology results reported by Stacie Acres RN on 11/03/2021. Electronically Signed   By: Evangeline Dakin M.D.   On: 11/04/2021 07:56   Result Date: 11/04/2021 CLINICAL DATA:  36 year old with a personal history of intermediate grade DCIS involving the UPPER OUTER QUADRANT of the LEFT breast who underwent BILATERAL mastectomies with implant reconstruction in January, 2021. She now has a palpable 1.5 cm mass in the UPPER OUTER QUADRANT of the reconstructed LEFT  breast. EXAM: ULTRASOUND GUIDED LEFT BREAST CORE NEEDLE BIOPSY DIAGNOSTIC LEFT MAMMOGRAM WITH TOMO POST BIOPSY COMPARISON:  Previous exam(s). PROCEDURE: I met with the patient and we discussed the procedure of ultrasound-guided biopsy, including benefits and alternatives. We discussed the high likelihood of a successful procedure. We discussed the risks of the procedure, including infection, bleeding, tissue injury, clip migration, and inadequate sampling. Informed written consent was given. The usual time-out protocol was performed immediately prior to the procedure. Lesion quadrant: UPPER OUTER QUADRANT. Using sterile technique with chlorhexidine as skin antisepsis, 1% Lidocaine as local anesthetic, under direct ultrasound visualization, a 14 gauge spring-loaded device placed through a 13 gauge introducer needle was used to perform biopsy of the mass in the [place] of the LEFT breast using an inferior approach. At the conclusion of the procedure, a HydroMark spiral shaped tissue marker clip was deployed into the biopsy cavity. Follow-up full field CC and mediolateral mammographic images were performed to confirm clip placement. The spiral shaped tissue marking clip is appropriately  positioned within biopsied mass in the [place]. Expected post biopsy changes are present without evidence of hematoma. The biopsied mass contains central fat. IMPRESSION: 1. Ultrasound guided needle biopsy of a palpable mass in the UPPER OUTER QUADRANT of the reconstructed LEFT breast. No apparent complications. 2. Appropriate positioning of the spiral shaped HydroMark tissue marking clip within the biopsied mass in the [place]. Electronically Signed: By: Evangeline Dakin M.D. On: 10/29/2021 08:44  MM CLIP PLACEMENT LEFT  Addendum Date: 11/04/2021   ADDENDUM REPORT: 11/04/2021 07:56 ADDENDUM: Pathology revealed: GRADE 2 INVASIVE DUCTAL CARCINOMA, LYMPHOVASCULAR INVASION: PRESENT of the LEFT breast, 1:30 o'clock, 6 cmfn, upper outer quadrant (Hydromark spiral clip). This was found to be concordant by Dr. Peggye Fothergill. Pathology results were discussed with the patient by telephone. The patient reported doing well after the biopsy with tenderness at the site. Post biopsy instructions and care were reviewed and questions were answered. The patient was encouraged to call [facility] for any additional concerns. Surgical consultation has been arranged with Dr[facility]roll Luna at [facility] on October 30 or sooner. Pathology results reported by Stacie Acres RN on 11/03/2021. Electronically Signed   By: Evangeline Dakin M.D.   On: 11/04/2021 07:56   Result Date: 11/04/2021 CLINICAL DATA:  36 year old with a personal history of intermediate grade DCIS involving the UPPER OUTER QUADRANT of the LEFT breast who underwent BILATERAL mastectomies with implant reconstruction in January, 2021. She now has a palpable 1.5 cm mass in the UPPER OUTER QUADRANT of the reconstructed LEFT breast. EXAM: ULTRASOUND GUIDED LEFT BREAST CORE NEEDLE BIOPSY DIAGNOSTIC LEFT MAMMOGRAM WITH TOMO POST BIOPSY COMPARISON:  Previous exam(s). PROCEDURE: I met with the patient and  we discussed the procedure of ultrasound-guided biopsy, including benefits and alternatives. We discussed the high likelihood of a successful procedure. We discussed the risks of the procedure, including infection, bleeding, tissue injury, clip migration, and inadequate sampling. Informed written consent was given. The usual time-out protocol was performed immediately prior to the procedure. Lesion quadrant: UPPER OUTER QUADRANT. Using sterile technique with chlorhexidine as skin antisepsis, 1% Lidocaine as local anesthetic, under direct ultrasound visualization, a 14 gauge spring-loaded device placed through a 13 gauge introducer needle was used to perform biopsy of the mass in the [place] of the LEFT breast using an inferior approach. At the conclusion of the procedure, a HydroMark spiral shaped tissue marker clip was deployed into  the biopsy cavity. Follow-up full field CC and mediolateral mammographic images were performed to confirm clip placement. The spiral shaped tissue marking clip is appropriately positioned within biopsied mass in the [place]. Expected post biopsy changes are present without evidence of hematoma. The biopsied mass contains central fat. IMPRESSION: 1. Ultrasound guided needle biopsy of a palpable mass in the UPPER OUTER QUADRANT of the reconstructed LEFT breast. No apparent complications. 2. Appropriate positioning of the spiral shaped HydroMark tissue marking clip within the biopsied mass in the [place]. Electronically Signed: By: Evangeline Dakin M.D. On: 10/29/2021 08:44    Labs:  CBC: Recent Labs    11/18/21 1059  WBC 8.3  HGB 14.8  HCT 45.4  PLT 276    COAGS: No results for input(s): "INR", "APTT" in the last 8760 hours.  BMP: Recent Labs    11/18/21 1059  NA 140  K 3.9  CL 105  CO2 27  GLUCOSE 91  BUN 17  CALCIUM 9.8  CREATININE 0.98  GFRNONAA >60    LIVER FUNCTION TESTS: Recent Labs    11/18/21 1059  BILITOT  0.6  AST 16  ALT 9  ALKPHOS 66  PROT 7.6  ALBUMIN 4.7    TUMOR MARKERS: No results for input(s): "AFPTM", "CEA", "CA199", "CHROMGRNA" in the last 8760 hours.  Assessment and Plan:  Breast cancer; pending chemotherapy: Joelee R. Kalas, 36 year old female, presents today to the [place] [facility] department for an image-guided port-a-catheter placement.   Risks and benefits of image-guided port-a-catheter placement were discussed with the patient including, but not limited to bleeding, infection, pneumothorax, or fibrin sheath development and need for additional procedures.  All of the patient's questions were answered, patient is agreeable to proceed. She has been NPO.   Consent signed and in chart.  Thank you for this interesting consult.  I greatly enjoyed meeting Mikhaila Renee Levey and look forward to participating in their care.  A copy of this report was sent to the requesting provider on this date.  Electronically Signed: Soyla Dryer, AGACNP-BC [phone] 11/25/2021, 10:24 AM   I spent a total of  30 Minutes   in face to face in clinical consultation, greater than 50% of which was counseling/coordinating care for port-a-catheter placement.

## 2021-11-26 ENCOUNTER — Other Ambulatory Visit: Payer: Self-pay | Admitting: Radiology

## 2021-11-26 ENCOUNTER — Encounter: Payer: Self-pay | Admitting: Hematology and Oncology

## 2021-11-26 ENCOUNTER — Inpatient Hospital Stay: Payer: BC Managed Care – PPO

## 2021-11-26 ENCOUNTER — Inpatient Hospital Stay (HOSPITAL_BASED_OUTPATIENT_CLINIC_OR_DEPARTMENT_OTHER): Payer: BC Managed Care – PPO | Admitting: Hematology and Oncology

## 2021-11-26 ENCOUNTER — Ambulatory Visit (HOSPITAL_COMMUNITY)
Admission: RE | Admit: 2021-11-26 | Discharge: 2021-11-26 | Disposition: A | Discharge status: Home / Self Care | Payer: BC Managed Care – PPO | Source: Ambulatory Visit | Attending: Radiology | Admitting: Radiology

## 2021-11-26 ENCOUNTER — Encounter: Payer: Self-pay | Admitting: *Deleted

## 2021-11-26 ENCOUNTER — Inpatient Hospital Stay: Payer: BC Managed Care – PPO | Admitting: Licensed Clinical Social Worker

## 2021-11-26 ENCOUNTER — Other Ambulatory Visit: Payer: BC Managed Care – PPO

## 2021-11-26 ENCOUNTER — Telehealth: Payer: Self-pay | Admitting: *Deleted

## 2021-11-26 VITALS — BP 111/65 | HR 67 | Temp 98.7°F | Resp 17

## 2021-11-26 VITALS — BP 116/59 | HR 64 | Temp 98.1°F | Resp 16 | Ht 66.0 in | Wt 152.0 lb

## 2021-11-26 DIAGNOSIS — Z170 Estrogen receptor positive status [ER+]: Secondary | ICD-10-CM

## 2021-11-26 DIAGNOSIS — Z452 Encounter for adjustment and management of vascular access device: Secondary | ICD-10-CM | POA: Insufficient documentation

## 2021-11-26 DIAGNOSIS — C50412 Malignant neoplasm of upper-outer quadrant of left female breast: Secondary | ICD-10-CM

## 2021-11-26 HISTORY — PX: IR RADIOLOGIST EVAL & MGMT: IMG5224

## 2021-11-26 MED ORDER — ACETAMINOPHEN 325 MG PO TABS
650.0000 mg | ORAL_TABLET | Freq: Once | ORAL | Status: AC
  Administered 2021-11-26: 650 mg via ORAL
  Filled 2021-11-26: qty 2

## 2021-11-26 MED ORDER — TRASTUZUMAB-DKST CHEMO 150 MG IV SOLR
8.0000 mg/kg | Freq: Once | INTRAVENOUS | Status: AC
  Administered 2021-11-26: 567 mg via INTRAVENOUS
  Filled 2021-11-26: qty 27

## 2021-11-26 MED ORDER — SODIUM CHLORIDE 0.9 % IV SOLN
840.0000 mg | Freq: Once | INTRAVENOUS | Status: AC
  Administered 2021-11-26: 840 mg via INTRAVENOUS
  Filled 2021-11-26: qty 28

## 2021-11-26 MED ORDER — PALONOSETRON HCL INJECTION 0.25 MG/5ML
0.2500 mg | Freq: Once | INTRAVENOUS | Status: AC
  Administered 2021-11-26: 0.25 mg via INTRAVENOUS
  Filled 2021-11-26: qty 5

## 2021-11-26 MED ORDER — SODIUM CHLORIDE 0.9 % IV SOLN
75.0000 mg/m2 | Freq: Once | INTRAVENOUS | Status: AC
  Administered 2021-11-26: 140 mg via INTRAVENOUS
  Filled 2021-11-26: qty 14

## 2021-11-26 MED ORDER — HEPARIN SOD (PORK) LOCK FLUSH 100 UNIT/ML IV SOLN
500.0000 [IU] | Freq: Once | INTRAVENOUS | Status: AC | PRN
  Administered 2021-11-26: 500 [IU]

## 2021-11-26 MED ORDER — ALPRAZOLAM 0.5 MG PO TABS: 0.5000 mg | ORAL_TABLET | Freq: Three times a day (TID) | ORAL | 0 refills | Status: DC | PRN

## 2021-11-26 MED ORDER — SODIUM CHLORIDE 0.9 % IV SOLN
150.0000 mg | Freq: Once | INTRAVENOUS | Status: AC
  Administered 2021-11-26: 150 mg via INTRAVENOUS
  Filled 2021-11-26: qty 150

## 2021-11-26 MED ORDER — SODIUM CHLORIDE 0.9 % IV SOLN: Freq: Once | INTRAVENOUS | Status: AC

## 2021-11-26 MED ORDER — SODIUM CHLORIDE 0.9 % IV SOLN
10.0000 mg | Freq: Once | INTRAVENOUS | Status: AC
  Administered 2021-11-26: 10 mg via INTRAVENOUS
  Filled 2021-11-26: qty 10

## 2021-11-26 MED ORDER — SODIUM CHLORIDE 0.9 % IV SOLN
669.0000 mg | Freq: Once | INTRAVENOUS | Status: AC
  Administered 2021-11-26: 670 mg via INTRAVENOUS
  Filled 2021-11-26: qty 67

## 2021-11-26 MED ORDER — SODIUM CHLORIDE 0.9% FLUSH
10.0000 mL | INTRAVENOUS | Status: DC | PRN
  Administered 2021-11-26: 10 mL

## 2021-11-26 MED ORDER — DIPHENHYDRAMINE HCL 25 MG PO CAPS
50.0000 mg | ORAL_CAPSULE | Freq: Once | ORAL | Status: AC
  Administered 2021-11-26: 50 mg via ORAL
  Filled 2021-11-26: qty 2

## 2021-11-26 NOTE — Progress Notes (Signed)
We were notified by the [place] that the patient's bandage was adhered to the underlying dermabond and asked to assess.  She presented in our dept during a break from her infusion.  In sterile fashion her bandage was removed and the underlying dermabond was removed with the bandage.  The port was deaccessed.  One of the underlying vicryl sutures was stuck to the dermabond which was cut.  The area was cleaned once more with chlorahexadine.  Additional dermabond was applied, with the incision remaining well-apposed. After the dermabond dried, the port was re-accessed and flushed with saline without difficulty.  A new sterile bandage was applied.  Follow up as needed with IR.  Ruthann Cancer, MD Pager: [phone]

## 2021-11-26 NOTE — Progress Notes (Signed)
[facility] NOTE  Patient Care Team: Sasser, Silvestre Moment, MD as PCP - General (Family Medicine) Erroll Luna, MD as Consulting Physician (General Surgery) Irene Limbo, MD as Consulting Physician (Plastic Surgery) Eppie Gibson, MD as Consulting Physician (Radiation Oncology) Rockwell Germany, RN as Oncology Nurse Navigator Mauro Kaufmann, RN as Oncology Nurse Navigator Azucena Fallen, MD as Consulting Physician (Obstetrics and Gynecology) Lavonna Monarch, MD (Inactive) as Consulting Physician (Dermatology) Azucena Fallen, MD as Consulting Physician (Obstetrics and Gynecology) Warren Danes, PA-C as Physician Assistant (Dermatology) Benay Pike, MD as Consulting Physician (Hematology and Oncology)  CHIEF COMPLAINTS/PURPOSE OF CONSULTATION:  Breast cancer follow up  HISTORY OF PRESENTING ILLNESS:  Aneesa Renee Kitts 36 y.o. female is here because of recent diagnosis of left breast cancer  I reviewed her records extensively and collaborated the history with the patient.  SUMMARY OF ONCOLOGIC HISTORY: Oncology History  Malignant neoplasm of upper-outer quadrant of female breast ([place]) (Resolved)  10/20/2018 Initial Diagnosis   Malignant neoplasm of upper-outer quadrant of left breast in female, estrogen receptor positive ([place])   10/20/2018 Cancer Staging   Staging form: Breast, AJCC 8th Edition - Clinical stage from 10/20/2018: Stage 0 (cTis (DCIS), cN0, cM0, ER+, PR-) - Signed by Chauncey Cruel, MD on 02/13/2020 Stage prefix: Initial diagnosis   11/03/2018 Genetic Testing   Negative genetic testing:  No pathogenic variants detected on the [facility] Multi-Cancer panel, ordered by Dr. Benjie Karvonen at [facility]. The report date is 11/03/2018.  The Multi-Cancer Panel offered by [facility] includes sequencing and/or deletion duplication testing of the following 84 genes: AIP, ALK, APC, ATM, AXIN2,BAP1,  BARD1, BLM, BMPR1A, BRCA1, BRCA2, BRIP1, CASR,  CDC73, CDH1, CDK4, CDKN1B, CDKN1C, CDKN2A (p14ARF), CDKN2A (p16INK4a), CEBPA, CHEK2, CTNNA1, DICER1, DIS3L2, EGFR (c.2369C>T, p.Thr790Met variant only), EPCAM (Deletion/duplication testing only), FH, FLCN, GATA2, GPC3, GREM1 (Promoter region deletion/duplication testing only), HOXB13 (c.251G>A, p.Gly84Glu), HRAS, KIT, MAX, MEN1, MET, MITF (c.952G>A, p.Glu318Lys variant only), MLH1, MSH2, MSH3, MSH6, MUTYH, NBN, NF1, NF2, NTHL1, PALB2, PDGFRA, PHOX2B, PMS2, POLD1, POLE, POT1, PRKAR1A, PTCH1, PTEN, RAD50, RAD51C, RAD51D, RB1, RECQL4, RET, RUNX1, SDHAF2, SDHA (sequence changes only), SDHB, SDHC, SDHD, SMAD4, SMARCA4, SMARCB1, SMARCE1, STK11, SUFU, TERC, TERT, TMEM127, TP53, TSC1, TSC2, VHL, WRN and WT1.     01/17/2019 Cancer Staging   Staging form: Breast, AJCC 8th Edition - Pathologic stage from 01/17/2019: Stage IA (pT32m, pN0, cM0, G2, ER+, PR-, HER2-) - Signed by CGardenia Phlegm NP on 02/01/2019   Malignant neoplasm of upper-outer quadrant of left breast in female, estrogen receptor positive ([place]  10/21/2021 Breast [place]  Breast ultrasound of the palpable mass showed hypoechoic oval vascular mass with indistinct margins measuring 1.5 x 1 cm x 1.4 cm.  No abnormal lymph nodes found in the left axilla.  She had ultrasound-guided biopsy.  MRI scheduled for tomorrow.   10/29/2021 Pathology Results   Surgical pathology from the left breast needle core biopsy at 130 o'clock showed invasive ductal carcinoma, overall grade 2, prognostics ER 70% positive weak to moderate staining, PR 0% negative, Ki-67 of 80% and HER2 3+   11/18/2021 Initial Diagnosis   Malignant neoplasm of upper-outer quadrant of left breast in female, estrogen receptor positive ([place]   11/18/2021 Cancer Staging   Staging form: Breast, AJCC 8th Edition - Clinical: Stage IA (cT1c, cN0, cM0, G2, ER+, PR-, HER2+) - Signed by IBenay Pike MD on 11/18/2021 Histologic grading system: 3 grade system   11/26/2021 -  Chemotherapy  Patient is on Treatment Plan : BREAST  Docetaxel + Carboplatin + Trastuzumab + Pertuzumab  (TCHP) q21d       Patient arrived to the appointment today with her husband. 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  She is very healthy outside the history of DCIS.  Family history with significant breast cancer on both sides.   She is here for follow up before C1 [facility] placement yesterday CT and bone scan scheduled for 11/22 Some situational anxiety, otherwise doing well.  MEDICAL HISTORY:  Past Medical History:  Diagnosis Date   Atypical mole 03/14/2019   mod-left side superior   BRCA gene mutation negative in female    Breast cancer [facility])    Family history of breast cancer    Family history of lung cancer    Family history of rectal cancer    Melanoma ([place]) 10/14/2007   level II- right arm- ([facility])   PONV (postoperative nausea and vomiting)     SURGICAL HISTORY: Past Surgical History:  Procedure Laterality Date   BREAST RECONSTRUCTION WITH PLACEMENT OF TISSUE EXPANDER AND ALLODERM Bilateral 01/17/2019   Procedure: BILATERAL BREAST RECONSTRUCTION WITH PLACEMENT OF TISSUE EXPANDER AND ALLODERM;  Surgeon: Irene Limbo, MD;  Location: [place];  Service: Plastics;  Laterality: Bilateral;   IR IMAGING GUIDED PORT INSERTION  11/25/2021   LESION REMOVAL Left 06/02/2019   Procedure: MINOR EXICISION OF LESION,LAYERED CLOSURE 1 CM;  Surgeon: Irene Limbo, MD;  Location: [place];  Service: Plastics;  Laterality: Left;   LIPOSUCTION WITH LIPOFILLING Bilateral 06/02/2019   Procedure: LIPOFILLING FROM ABDOMEN TO BILATERAL CHEST;   Surgeon: Irene Limbo, MD;  Location: [place];  Service: Plastics;  Laterality: Bilateral;   melanoma surgery Right 2009   upper right arm   NIPPLE SPARING MASTECTOMY WITH SENTINEL LYMPH NODE BIOPSY Bilateral 01/17/2019   Procedure: BILATERAL NIPPLE SPARING MASTECTOMIES WITH LEFT SENTINEL LYMPH NODE MAPPING;  Surgeon: Erroll Luna, MD;  Location: [place];  Service: General;  Laterality: Bilateral;   REMOVAL OF BILATERAL TISSUE EXPANDERS WITH PLACEMENT OF BILATERAL BREAST IMPLANTS Bilateral 06/02/2019   Procedure: REMOVAL OF BILATERAL TISSUE EXPANDERS WITH PLACEMENT OF BILATERAL SILICONE BREAST IMPLANTS;  Surgeon: Irene Limbo, MD;  Location: [place];  Service: Plastics;  Laterality: Bilateral;   WISDOM TOOTH EXTRACTION      SOCIAL HISTORY: Social History   Socioeconomic History   Marital status: Married    Spouse name: Not on file   Number of children: Not on file   Years of education: Not on file   Highest education level: Not on file  Occupational History   Not on file  Tobacco Use   Smoking status: Never   Smokeless tobacco: Never  Vaping Use   Vaping Use: Never used  Substance and Sexual Activity   Alcohol use: Yes    Comment: occasional   Drug use: Never   Sexual activity: Yes    Birth control/protection: Pill  Other Topics Concern   Not on file  Social History Narrative   Not on file   Social Determinants of Health   Financial Resource Strain: Not on file  Food  Insecurity: Not on file  Transportation Needs: No Transportation Needs (11/09/2018)   PRAPARE - Hydrologist (Medical): No    Lack of Transportation (Non-Medical): No  Physical Activity: Not on file  Stress: Not on file  Social Connections: Not on file  Intimate Partner Violence: Not At Risk (11/09/2018)   Humiliation, Afraid, Rape, and Kick questionnaire    Fear of Current or Ex-Partner: No    Emotionally  Abused: No    Physically Abused: No    Sexually Abused: No    FAMILY HISTORY: Family History  Problem Relation Age of Onset   Hypertension Mother    Heart disease Father    Breast cancer Maternal Aunt 60 - 69   Breast cancer Maternal Aunt 56 - 59   Lung cancer Paternal Aunt        diagnosed late 47s   Breast cancer Maternal Grandmother 60 - 69   Breast cancer Paternal Grandmother 60 - 51   Lung cancer Paternal Grandmother        thought to be breast cancer mets   Rectal cancer Paternal Grandmother        diagnosed 34s   Lung cancer Paternal Grandfather        diagnosed 39s    ALLERGIES:  is allergic to oxycodone.  MEDICATIONS:  Current Outpatient Medications  Medication Sig Dispense Refill   ALPRAZolam (XANAX) 0.5 MG tablet Take 1 tablet (0.5 mg total) by mouth 3 (three) times daily as needed for anxiety. 20 tablet 0   cetirizine (ZYRTEC) 10 MG tablet Take 10 mg by mouth daily.      dexamethasone (DECADRON) 4 MG tablet Take 2 tabs by mouth 2 times daily starting day before chemo. Then take 2 tabs daily for 2 days starting day after chemo. Take with food. 30 tablet 1   lidocaine-prilocaine (EMLA) cream Apply to affected area once 30 g 3   meclizine (ANTIVERT) 25 MG tablet Take 25 mg by mouth 3 (three) times daily as needed (Vertigo/Motion Sickness).     Multiple Vitamins-Minerals (MULTIVITAMIN WITH MINERALS) tablet Take 1 tablet by mouth daily.     ondansetron (ZOFRAN) 8 MG tablet Take 1 tablet (8 mg total) by mouth every 8 (eight) hours as needed for nausea or vomiting. Start on the third day after chemotherapy. 30 tablet 1   prochlorperazine (COMPAZINE) 10 MG tablet Take 1 tablet (10 mg total) by mouth every 6 (six) hours as needed for nausea or vomiting. 30 tablet 1   No current facility-administered medications for this visit.    REVIEW OF SYSTEMS:   Constitutional: Denies fevers, chills or abnormal night sweats Eyes: Denies blurriness of vision, double vision or watery  eyes Ears, nose, mouth, throat, and face: Denies mucositis or sore throat Respiratory: Denies cough, dyspnea or wheezes Cardiovascular: Denies palpitation, chest discomfort or lower extremity swelling Gastrointestinal:  Denies nausea, heartburn or change in bowel habits Skin: Denies abnormal skin rashes Lymphatics: Denies new lymphadenopathy or easy bruising Neurological:Denies numbness, tingling or new weaknesses Behavioral/Psych: Mood is stable, no new changes  Breast:  Denies any palpable lumps or discharge All other systems were reviewed with the patient and are negative.  PHYSICAL EXAMINATION: ECOG PERFORMANCE STATUS: 0 - Asymptomatic  Vitals:   11/26/21 0825  BP: (!) 116/59  Pulse: 64  Resp: 16  Temp: 98.1 F (36.7 C)  SpO2: 100%   Filed Weights   11/26/21 0825  Weight: 152 lb (68.9 kg)    GENERAL:alert,  no distress and comfortable BREAST: Palpable left breast mass upper outer quadrant measuring about 2-1/2 to 3 cm in largest dimension.  There is some palpable abnormal density in the left axilla however I am unable to clearly distinguish if this is pathologic lymphadenopathy.  Rest of the breasts bilaterally appear normal. No change compared to last visit.  LABORATORY DATA:  I have reviewed the data as listed Lab Results  Component Value Date   WBC 8.3 11/18/2021   HGB 14.8 11/18/2021   HCT 45.4 11/18/2021   MCV 81.2 11/18/2021   PLT 276 11/18/2021   Lab Results  Component Value Date   NA 140 11/18/2021   K 3.9 11/18/2021   CL 105 11/18/2021   CO2 27 11/18/2021    RADIOGRAPHIC STUDIES: I have personally reviewed the radiological reports and agreed with the findings in the report.  ASSESSMENT AND PLAN:  Malignant neoplasm of upper-outer quadrant of left breast in female, estrogen receptor positive ([place]) This is a very pleasant 36 year old premenopausal female patient with prior history of left breast DCIS with microinvasive component, negative margins now  diagnosed with left breast upper outer quadrant invasive ductal carcinoma, overall grade 2, ER +70% weak to moderate staining, PR 0% negative, HER2 3+ by IHC, Ki-67 of 80% referred to breast oncology for neoadjuvant recommendations.  Since last visit, she had breast MRI which showed tumor size measuring up to 3.6 cm.  No regional adenopathy.  She has CT and bone scan scheduled for 1122.  Given rapidly growing tumor with very high proliferation index as well as less likelihood of distant metastatic disease, we have agreed to starting neoadjuvant chemotherapy today.  She understands that in the unforeseen or unfortunate circumstance of metastatic disease, we may have to addend the treatment.  Ok to start using labs from 11/8 RTC in 3 weeks or sooner for second cycle of TCHP. Situational anxiety, will give her some alprazolam.   Total time spent 30 minutes including history, physical exam, review of records, counseling and coordination of care All questions were answered. The patient knows to call the clinic with any problems, questions or concerns.    Benay Pike, MD 11/26/21

## 2021-11-26 NOTE — Progress Notes (Signed)
[facility] Work  Initial Assessment   Stephanie [name] is a 36 y.o. year old female accompanied by spouse, Sherren Mocha. Clinical Social Work was referred by new patient protocol for assessment of psychosocial needs.   SDOH (Social Determinants of Health) assessments performed: No   SDOH Screenings   Transportation Needs: No Transportation Needs (11/09/2018)  Tobacco Use: Low Risk  (11/26/2021)     Distress Screen completed: No     No data to display            Family/Social Information:  Housing Arrangement: patient lives with husband, Sherren Mocha Family members/support persons in your life? Family, Friends, and Education officer, community concerns: no  Employment: Working full time as Mudlogger. Has benefits and time off if needed.  Income source: Employment Financial concerns: No Type of concern: None Food access concerns: no Religious or spiritual practice: Not known Services Currently in place:  anxiety medication from [employer]  Coping/ Adjustment to diagnosis: Patient understands treatment plan and what happens next? yes, she is anxious since yesterday since she has her first chemo treatment today. Last diagnosis, she did not receive chemo (only had surgery), so this feels different for her. She does have a strong support network and has met another woman around her age who just completed treatment Concerns about diagnosis and/or treatment:  How will she feel during chemo Patient reported stressors: Adjusting to my illness Current coping skills/ strengths: Ability for insight , Capable of independent living , Communication skills , and Supportive family/friends     SUMMARY: Current SDOH Barriers:  None noted today  Clinical Social Work Clinical Goal(s):  Patient will continue to utilize her support network through treatment. Patient will utilize [facility] support as needed to further manage anxiety  Interventions: Discussed common feeling and emotions when  being diagnosed with cancer, and the importance of support during treatment Informed patient of the support team roles and support services at [facility] Provided [place] contact information and encouraged patient to call with any questions or concerns   Follow Up Plan: Patient will contact CSW with any support or resource needs Patient verbalizes understanding of plan: Yes    Ara Mano E Zed Wanninger, LCSW

## 2021-11-26 NOTE — Assessment & Plan Note (Signed)
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  Since last visit, she had breast MRI which showed tumor size measuring up to 3.6 cm.  No regional adenopathy.  She has CT and bone scan scheduled for 1122.  Given rapidly growing tumor with very high proliferation index as well as less likelihood of distant metastatic disease, we have agreed to starting neoadjuvant chemotherapy today.  She understands that in the unforeseen or unfortunate circumstance of metastatic disease, we may have to addend the treatment.  Ok to start using labs from 11/8 RTC in 3 weeks or sooner for second cycle of TCHP. Situational anxiety, will give her some alprazolam.

## 2021-11-26 NOTE — Telephone Encounter (Signed)
Per MD - no lab needed today for 1st chemo - may use labs from 11/18/2021.

## 2021-11-26 NOTE — Patient Instructions (Signed)
[facility]  Discharge Instructions: Thank you for choosing [facility] to provide your oncology and hematology care.   If you have a lab appointment with the [facility], please go directly to the [facility] and check in at the registration area.   Wear comfortable clothing and clothing appropriate for easy access to any Portacath or PICC line.   We strive to give you quality time with your provider. You may need to reschedule your appointment if you arrive late (15 or more minutes).  Arriving late affects you and other patients whose appointments are after yours.  Also, if you miss three or more appointments without notifying the office, you may be dismissed from the clinic at the provider's discretion.      For prescription refill requests, have your pharmacy contact our office and allow 72 hours for refills to be completed.    Today you received the following chemotherapy and/or immunotherapy agents : Trastuzumab, Pertuzumab, Taxotere, Carboplatin      To help prevent nausea and vomiting after your treatment, we encourage you to take your nausea medication as directed.  BELOW ARE SYMPTOMS THAT SHOULD BE REPORTED IMMEDIATELY: *FEVER GREATER THAN 100.4 F (38 C) OR HIGHER *CHILLS OR SWEATING *NAUSEA AND VOMITING THAT IS NOT CONTROLLED WITH YOUR NAUSEA MEDICATION *UNUSUAL SHORTNESS OF BREATH *UNUSUAL BRUISING OR BLEEDING *URINARY PROBLEMS (pain or burning when urinating, or frequent urination) *BOWEL PROBLEMS (unusual diarrhea, constipation, pain near the anus) TENDERNESS IN MOUTH AND THROAT WITH OR WITHOUT PRESENCE OF ULCERS (sore throat, sores in mouth, or a toothache) UNUSUAL RASH, SWELLING OR PAIN  UNUSUAL VAGINAL DISCHARGE OR ITCHING   Items with * indicate a potential emergency and should be followed up as soon as possible or go to the Emergency Department if any problems should occur.  Please show the CHEMOTHERAPY ALERT CARD or  IMMUNOTHERAPY ALERT CARD at check-in to the Emergency Department and triage nurse.  Should you have questions after your visit or need to cancel or reschedule your appointment, please contact [facility]  Dept: [phone]  and follow the prompts.  Office hours are 8:00 a.m. to 4:30 p.m. Monday - Friday. Please note that voicemails left after 4:00 p.m. may not be returned until the following business day.  We are closed weekends and major holidays. You have access to a nurse at all times for urgent questions. Please call the main number to the clinic Dept: [phone] and follow the prompts.   For any non-urgent questions, you may also contact your provider using MyChart. We now offer e-Visits for anyone 75 and older to request care online for non-urgent symptoms. For details visit [email or web address].   Also download the MyChart app! Go to the app store, search "MyChart", open the app, select [facility], and log in with your MyChart username and password.  Masks are optional in the cancer centers. If you would like for your care team to wear a mask while they are taking care of you, please let them know. You may have one support person who is at least 38 years old accompany you for your appointments. Trastuzumab; Hyaluronidase Injection What is this medication? TRASTUZUMAB; HYALURONIDASE (tras TOO zoo mab; hye al ur ON i dase) treats breast cancer. Trastuzumab works by blocking a protein that causes cancer cells to grow and multiply. This helps to slow or stop the spread of cancer cells. Hyaluronidase works by increasing the absorption of other medications in the  body to help them work better. It is a combination medication that contains a monoclonal antibody. This medicine may be used for other purposes; ask your health care provider or pharmacist if you have questions. COMMON BRAND NAME(S): HERCEPTIN HYLECTA What should I tell my care team before I take this  medication? They need to know if you have any of these conditions: Heart failure Lung disease An unusual or allergic reaction to trastuzumab, or other medications, foods, dyes, or preservatives Pregnant or trying to get pregnant Breast-feeding How should I use this medication? This medication is injected under the skin. It is given by your care team in a hospital or clinic setting. Talk to your care team about the use of this medication in children. It is not approved for use in children. Overdosage: If you think you have taken too much of this medicine contact a poison control center or emergency room at once. NOTE: This medicine is only for you. Do not share this medicine with others. What if I miss a dose? Keep appointments for follow-up doses. It is important not to miss your dose. Call your care team if you are unable to keep an appointment. What may interact with this medication? Certain types of chemotherapy, such as daunorubicin, doxorubicin, epirubicin, idarubicin This list may not describe all possible interactions. Give your health care provider a list of all the medicines, herbs, non-prescription drugs, or dietary supplements you use. Also tell them if you smoke, drink alcohol, or use illegal drugs. Some items may interact with your medicine. What should I watch for while using this medication? Your condition will be monitored carefully while you are receiving this medication. This medication may make you feel generally unwell. This is not uncommon as chemotherapy can affect healthy cells as well as cancer cells. Report any side effects. Continue your course of treatment even though you feel ill unless your care team tells you to stop. This medication may increase your risk of getting an infection. Call your care team for advice if you get a fever, chills, sore throat, or other symptoms of a cold or flu. Do not treat yourself. Try to avoid being around people who are sick. Avoid  taking medications that contain aspirin, acetaminophen, ibuprofen, naproxen, or ketoprofen unless instructed by your care team. These medications may hide a fever. Talk to your care team if you may be pregnant. Serious birth defects can occur if you take this medication during pregnancy and for 7 months after the last dose. You will need a negative pregnancy test before starting this medication. Contraception is recommended while taking this medication and for 7 months after the last dose. Your care team can help you find the option that works for you. Do not breastfeed while taking this medication or for 7 months after the last dose. What side effects may I notice from receiving this medication? Side effects that you should report to your care team as soon as possible: Allergic reactions or angioedema--skin rash, itching or hives, swelling of the face, eyes, lips, tongue, arms, or legs, trouble swallowing or breathing Dry cough, shortness of breath or trouble breathing Heart failure--shortness of breath, swelling of the ankles, feet, or hands, sudden weight gain, unusual weakness or fatigue Heart rhythm changes--fast or irregular heartbeat, dizziness, feeling faint or lightheaded, chest pain, trouble breathing Increase in blood pressure Infection--fever, chills, cough, or sore throat Side effects that usually do not require medical attention (report these to your care team if they continue or  are bothersome): Diarrhea Hair loss Headache Muscle pain Nausea Unusual weakness or fatigue This list may not describe all possible side effects. Call your doctor for medical advice about side effects. You may report side effects to FDA at 1-800-FDA-1088. Where should I keep my medication? This medication is given in a hospital or clinic. It will not be stored at home. NOTE: This sheet is a summary. It may not cover all possible information. If you have questions about this medicine, talk to your doctor,  pharmacist, or health care provider.  2023 Elsevier/Gold Standard (2021-05-13 00:00:00) Pertuzumab Injection What is this medication? PERTUZUMAB (per TOOZ ue mab) treats breast cancer. It works by blocking a protein that causes cancer cells to grow and multiply. This helps to slow or stop the spread of cancer cells. It is a monoclonal antibody. This medicine may be used for other purposes; ask your health care provider or pharmacist if you have questions. COMMON BRAND NAME(S): PERJETA What should I tell my care team before I take this medication? They need to know if you have any of these conditions: Heart failure An unusual or allergic reaction to pertuzumab, other medications, foods, dyes, or preservatives Pregnant or trying to get pregnant Breast-feeding How should I use this medication? This medication is injected into a vein. It is given by your care team in a hospital or clinic setting. Talk to your care team about the use of this medication in children. Special care may be needed. Overdosage: If you think you have taken too much of this medicine contact a poison control center or emergency room at once. NOTE: This medicine is only for you. Do not share this medicine with others. What if I miss a dose? Keep appointments for follow-up doses. It is important not to miss your dose. Call your care team if you are unable to keep an appointment. What may interact with this medication? Interactions are not expected. This list may not describe all possible interactions. Give your health care provider a list of all the medicines, herbs, non-prescription drugs, or dietary supplements you use. Also tell them if you smoke, drink alcohol, or use illegal drugs. Some items may interact with your medicine. What should I watch for while using this medication? Your condition will be monitored carefully while you are receiving this medication. This medication may make you feel generally unwell. This is  not uncommon as chemotherapy can affect healthy cells as well as cancer cells. Report any side effects. Continue your course of treatment even though you feel ill unless your care team tells you to stop. Talk to your care team if you may be pregnant. Serious birth defects can occur if you take this medication during pregnancy and for 7 months after the last dose. You will need a negative pregnancy test before starting this medication. Contraception is recommended while taking this medication and for 7 months after the last dose. Your care team can help you find the option that works for you. Do not breastfeed while taking this medication and for 7 months after the last dose. What side effects may I notice from receiving this medication? Side effects that you should report to your care team as soon as possible: Allergic reactions or angioedema--skin rash, itching or hives, swelling of the face, eyes, lips, tongue, arms, or legs, trouble swallowing or breathing Heart failure--shortness of breath, swelling of the ankles, feet, or hands, sudden weight gain, unusual weakness or fatigue Infusion reactions--chest pain, shortness of breath or trouble  breathing, feeling faint or lightheaded Side effects that usually do not require medical attention (report to your care team if they continue or are bothersome): Diarrhea Dry skin Fatigue Hair loss Nausea Vomiting This list may not describe all possible side effects. Call your doctor for medical advice about side effects. You may report side effects to FDA at 1-800-FDA-1088. Where should I keep my medication? This medication is given in a hospital or clinic. It will not be stored at home. NOTE: This sheet is a summary. It may not cover all possible information. If you have questions about this medicine, talk to your doctor, pharmacist, or health care provider.  2023 Elsevier/Gold Standard (2021-05-13 00:00:00) Docetaxel Injection What is this  medication? DOCETAXEL (doe se TAX el) treats some types of cancer. It works by slowing down the growth of cancer cells. This medicine may be used for other purposes; ask your health care provider or pharmacist if you have questions. COMMON BRAND NAME(S): Docefrez, Taxotere What should I tell my care team before I take this medication? They need to know if you have any of these conditions: Kidney disease Liver disease Low white blood cell levels Tingling of the fingers or toes or other nerve disorder An unusual or allergic reaction to docetaxel, polysorbate 80, other medications, foods, dyes, or preservatives Pregnant or trying to get pregnant Breast-feeding How should I use this medication? This medication is injected into a vein. It is given by your care team in a hospital or clinic setting. Talk to your care team about the use of this medication in children. Special care may be needed. Overdosage: If you think you have taken too much of this medicine contact a poison control center or emergency room at once. NOTE: This medicine is only for you. Do not share this medicine with others. What if I miss a dose? Keep appointments for follow-up doses. It is important not to miss your dose. Call your care team if you are unable to keep an appointment. What may interact with this medication? Do not take this medication with any of the following: Live virus vaccines This medication may also interact with the following: Certain antibiotics, such as clarithromycin, telithromycin Certain antivirals for HIV or hepatitis Certain medications for fungal infections, such as itraconazole, ketoconazole, voriconazole Grapefruit juice Nefazodone Supplements, such as St. John's wort This list may not describe all possible interactions. Give your health care provider a list of all the medicines, herbs, non-prescription drugs, or dietary supplements you use. Also tell them if you smoke, drink alcohol, or use  illegal drugs. Some items may interact with your medicine. What should I watch for while using this medication? This medication may make you feel generally unwell. This is not uncommon as chemotherapy can affect healthy cells as well as cancer cells. Report any side effects. Continue your course of treatment even though you feel ill unless your care team tells you to stop. You may need blood work done while you are taking this medication. This medication can cause serious side effects and infusion reactions. To reduce the risk, your care team may give you other medications to take before receiving this one. Be sure to follow the directions from your care team. This medication may increase your risk of getting an infection. Call your care team for advice if you get a fever, chills, sore throat, or other symptoms of a cold or flu. Do not treat yourself. Try to avoid being around people who are sick. Avoid taking medications  that contain aspirin, acetaminophen, ibuprofen, naproxen, or ketoprofen unless instructed by your care team. These medications may hide a fever. Be careful brushing or flossing your teeth or using a toothpick because you may get an infection or bleed more easily. If you have any dental work done, tell your dentist you are receiving this medication. Some products may contain alcohol. Ask your care team if this medication contains alcohol. Be sure to tell all care teams you are taking this medicine. Certain medications, like metronidazole and disulfiram, can cause an unpleasant reaction when taken with alcohol. The reaction includes flushing, headache, nausea, vomiting, sweating, and increased thirst. The reaction can last from 30 minutes to several hours. This medication may affect your coordination, reaction time, or judgement. Do not drive or operate machinery until you know how this medication affects you. Sit up or stand slowly to reduce the risk of dizzy or fainting spells. Drinking  alcohol with this medication can increase the risk of these side effects. Talk to your care team about your risk of cancer. You may be more at risk for certain types of cancer if you take this medication. Talk to your care team if you wish to become pregnant or think you might be pregnant. This medication can cause serious birth defects if taken during pregnancy or if you get pregnant within 2 months after stopping therapy. A negative pregnancy test is required before starting this medication. A reliable form of contraception is recommended while taking this medication and for 2 months after stopping it. Talk to your care team about reliable forms of contraception. Do not breast-feed while taking this medication and for 1 week after stopping therapy. Use a condom during sex and for 4 months after stopping therapy. Tell your care team right away if you think your partner might be pregnant. This medication can cause serious birth defects. This medication may cause infertility. Talk to your care team if you are concerned about your fertility. What side effects may I notice from receiving this medication? Side effects that you should report to your care team as soon as possible: Allergic reactions--skin rash, itching, hives, swelling of the face, lips, tongue, or throat Change in vision such as blurry vision, seeing halos around lights, vision loss Infection--fever, chills, cough, or sore throat Infusion reactions--chest pain, shortness of breath or trouble breathing, feeling faint or lightheaded Low red blood cell level--unusual weakness or fatigue, dizziness, headache, trouble breathing Pain, tingling, or numbness in the hands or feet Painful swelling, warmth, or redness of the skin, blisters or sores at the infusion site Redness, blistering, peeling, or loosening of the skin, including inside the mouth Sudden or severe stomach pain, bloody diarrhea, fever, nausea, vomiting Swelling of the ankles,  hands, or feet Tumor lysis syndrome (TLS)--nausea, vomiting, diarrhea, decrease in the amount of urine, dark urine, unusual weakness or fatigue, confusion, muscle pain or cramps, fast or irregular heartbeat, joint pain Unusual bruising or bleeding Side effects that usually do not require medical attention (report to your care team if they continue or are bothersome): Change in nail shape, thickness, or color Change in taste Hair loss Increased tears This list may not describe all possible side effects. Call your doctor for medical advice about side effects. You may report side effects to FDA at 1-800-FDA-1088. Where should I keep my medication? This medication is given in a hospital or clinic. It will not be stored at home. NOTE: This sheet is a summary. It may not cover all possible  information. If you have questions about this medicine, talk to your doctor, pharmacist, or health care provider.  2023 Elsevier/Gold Standard (2007-02-19 00:00:00) Carboplatin Injection What is this medication? CARBOPLATIN (KAR boe pla tin) treats some types of cancer. It works by slowing down the growth of cancer cells. This medicine may be used for other purposes; ask your health care provider or pharmacist if you have questions. COMMON BRAND NAME(S): Paraplatin What should I tell my care team before I take this medication? They need to know if you have any of these conditions: Blood disorders Hearing problems Kidney disease Recent or ongoing radiation therapy An unusual or allergic reaction to carboplatin, cisplatin, other medications, foods, dyes, or preservatives Pregnant or trying to get pregnant Breast-feeding How should I use this medication? This medication is injected into a vein. It is given by your care team in a hospital or clinic setting. Talk to your care team about the use of this medication in children. Special care may be needed. Overdosage: If you think you have taken too much of this  medicine contact a poison control center or emergency room at once. NOTE: This medicine is only for you. Do not share this medicine with others. What if I miss a dose? Keep appointments for follow-up doses. It is important not to miss your dose. Call your care team if you are unable to keep an appointment. What may interact with this medication? Medications for seizures Some antibiotics, such as amikacin, gentamicin, neomycin, streptomycin, tobramycin Vaccines This list may not describe all possible interactions. Give your health care provider a list of all the medicines, herbs, non-prescription drugs, or dietary supplements you use. Also tell them if you smoke, drink alcohol, or use illegal drugs. Some items may interact with your medicine. What should I watch for while using this medication? Your condition will be monitored carefully while you are receiving this medication. You may need blood work while taking this medication. This medication may make you feel generally unwell. This is not uncommon, as chemotherapy can affect healthy cells as well as cancer cells. Report any side effects. Continue your course of treatment even though you feel ill unless your care team tells you to stop. In some cases, you may be given additional medications to help with side effects. Follow all directions for their use. This medication may increase your risk of getting an infection. Call your care team for advice if you get a fever, chills, sore throat, or other symptoms of a cold or flu. Do not treat yourself. Try to avoid being around people who are sick. Avoid taking medications that contain aspirin, acetaminophen, ibuprofen, naproxen, or ketoprofen unless instructed by your care team. These medications may hide a fever. Be careful brushing or flossing your teeth or using a toothpick because you may get an infection or bleed more easily. If you have any dental work done, tell your dentist you are receiving this  medication. Talk to your care team if you wish to become pregnant or think you might be pregnant. This medication can cause serious birth defects. Talk to your care team about effective forms of contraception. Do not breast-feed while taking this medication. What side effects may I notice from receiving this medication? Side effects that you should report to your care team as soon as possible: Allergic reactions--skin rash, itching, hives, swelling of the face, lips, tongue, or throat Infection--fever, chills, cough, sore throat, wounds that don't heal, pain or trouble when passing urine, general feeling of  discomfort or being unwell Low red blood cell level--unusual weakness or fatigue, dizziness, headache, trouble breathing Pain, tingling, or numbness in the hands or feet, muscle weakness, change in vision, confusion or trouble speaking, loss of balance or coordination, trouble walking, seizures Unusual bruising or bleeding Side effects that usually do not require medical attention (report to your care team if they continue or are bothersome): Hair loss Nausea Unusual weakness or fatigue Vomiting This list may not describe all possible side effects. Call your doctor for medical advice about side effects. You may report side effects to FDA at 1-800-FDA-1088. Where should I keep my medication? This medication is given in a hospital or clinic. It will not be stored at home. NOTE: This sheet is a summary. It may not cover all possible information. If you have questions about this medicine, talk to your doctor, pharmacist, or health care provider.  2023 Elsevier/Gold Standard (2021-04-14 00:00:00)

## 2021-11-27 ENCOUNTER — Telehealth: Payer: Self-pay

## 2021-11-27 ENCOUNTER — Other Ambulatory Visit: Payer: Self-pay

## 2021-11-27 NOTE — Telephone Encounter (Signed)
LM for patient that this nurse was calling to see how they were doing after their treatment. Please call back to Dr. Iruku's nurse at [phone] if they have any questions or concerns regarding the treatment. 

## 2021-11-27 NOTE — Telephone Encounter (Signed)
-----   Message from Scot Dock, RN sent at 11/26/2021  5:07 PM EST ----- Regarding: Dr. Chryl Heck first time chemo follow up

## 2021-11-28 ENCOUNTER — Other Ambulatory Visit: Payer: Self-pay

## 2021-11-28 ENCOUNTER — Inpatient Hospital Stay: Payer: BC Managed Care – PPO

## 2021-11-28 VITALS — BP 110/74 | HR 65 | Temp 98.6°F | Resp 16

## 2021-11-28 DIAGNOSIS — Z170 Estrogen receptor positive status [ER+]: Secondary | ICD-10-CM

## 2021-11-28 DIAGNOSIS — C50412 Malignant neoplasm of upper-outer quadrant of left female breast: Secondary | ICD-10-CM | POA: Diagnosis not present

## 2021-11-28 MED ORDER — PEGFILGRASTIM-CBQV 6 MG/0.6ML ~~LOC~~ SOSY
6.0000 mg | PREFILLED_SYRINGE | Freq: Once | SUBCUTANEOUS | Status: AC
  Administered 2021-11-28: 6 mg via SUBCUTANEOUS
  Filled 2021-11-28: qty 0.6

## 2021-12-01 ENCOUNTER — Ambulatory Visit (HOSPITAL_BASED_OUTPATIENT_CLINIC_OR_DEPARTMENT_OTHER): Admit: 2021-12-01 | Payer: BC Managed Care – PPO | Admitting: Surgery

## 2021-12-01 ENCOUNTER — Encounter (HOSPITAL_BASED_OUTPATIENT_CLINIC_OR_DEPARTMENT_OTHER): Payer: Self-pay

## 2021-12-01 ENCOUNTER — Telehealth: Payer: Self-pay | Admitting: *Deleted

## 2021-12-01 ENCOUNTER — Other Ambulatory Visit: Payer: Self-pay | Admitting: *Deleted

## 2021-12-01 SURGERY — INSERTION, TUNNELED CENTRAL VENOUS DEVICE, WITH PORT
Anesthesia: General

## 2021-12-01 NOTE — Telephone Encounter (Signed)
This RN received communication from on call nursing- after hours- per pt stating onset of sore throat and earache post 1st chemo on 11/15 with pegfilgrastim on 11/17  No answer pt RN call and received VM- message left to return call to this RN.

## 2021-12-01 NOTE — Telephone Encounter (Signed)
This RN spoke with pt per

## 2021-12-03 ENCOUNTER — Other Ambulatory Visit (HOSPITAL_COMMUNITY): Payer: BC Managed Care – PPO

## 2021-12-03 ENCOUNTER — Encounter (HOSPITAL_COMMUNITY)
Admission: RE | Admit: 2021-12-03 | Discharge: 2021-12-03 | Disposition: A | Discharge status: Home / Self Care | Payer: BC Managed Care – PPO | Source: Ambulatory Visit | Attending: Hematology and Oncology | Admitting: Hematology and Oncology

## 2021-12-03 ENCOUNTER — Telehealth: Payer: Self-pay | Admitting: *Deleted

## 2021-12-03 ENCOUNTER — Ambulatory Visit (HOSPITAL_COMMUNITY)
Admission: RE | Admit: 2021-12-03 | Discharge: 2021-12-03 | Disposition: A | Discharge status: Home / Self Care | Payer: BC Managed Care – PPO | Source: Ambulatory Visit | Attending: Hematology and Oncology | Admitting: Hematology and Oncology

## 2021-12-03 DIAGNOSIS — C50919 Malignant neoplasm of unspecified site of unspecified female breast: Secondary | ICD-10-CM

## 2021-12-03 DIAGNOSIS — C50412 Malignant neoplasm of upper-outer quadrant of left female breast: Secondary | ICD-10-CM | POA: Insufficient documentation

## 2021-12-03 DIAGNOSIS — Z170 Estrogen receptor positive status [ER+]: Secondary | ICD-10-CM

## 2021-12-03 DIAGNOSIS — M7918 Myalgia, other site: Secondary | ICD-10-CM

## 2021-12-03 MED ORDER — IOHEXOL 300 MG/ML  SOLN
100.0000 mL | Freq: Once | INTRAMUSCULAR | Status: AC | PRN
  Administered 2021-12-03: 100 mL via INTRAVENOUS

## 2021-12-03 MED ORDER — TECHNETIUM TC 99M MEDRONATE IV KIT: 20.0000 | PACK | Freq: Once | INTRAVENOUS | Status: DC | PRN

## 2021-12-03 MED ORDER — SODIUM CHLORIDE (PF) 0.9 % IJ SOLN
INTRAMUSCULAR | Status: AC
  Filled 2021-12-03: qty 50

## 2021-12-03 MED ORDER — CIPROFLOXACIN HCL 500 MG PO TABS: 500.0000 mg | ORAL_TABLET | Freq: Two times a day (BID) | ORAL | 0 refills | Status: DC

## 2021-12-03 NOTE — Telephone Encounter (Signed)
Pt called this RN at [phone] stating she has burning, pressure and frequency with urination. No fevers or chills noted.  Per MD - prescription for antibiotic obtained and sent to verified pharmacy

## 2021-12-05 ENCOUNTER — Encounter: Payer: Self-pay | Admitting: Hematology and Oncology

## 2021-12-10 ENCOUNTER — Encounter: Payer: Self-pay | Admitting: *Deleted

## 2021-12-15 MED FILL — Dexamethasone Sodium Phosphate Inj 100 MG/10ML: INTRAMUSCULAR | Qty: 1 | Status: AC

## 2021-12-15 MED FILL — Fosaprepitant Dimeglumine For IV Infusion 150 MG (Base Eq): INTRAVENOUS | Qty: 5 | Status: AC

## 2021-12-16 ENCOUNTER — Inpatient Hospital Stay: Payer: BC Managed Care – PPO | Attending: Hematology and Oncology | Admitting: Hematology and Oncology

## 2021-12-16 ENCOUNTER — Inpatient Hospital Stay: Payer: BC Managed Care – PPO

## 2021-12-16 ENCOUNTER — Encounter: Payer: Self-pay | Admitting: Hematology and Oncology

## 2021-12-16 VITALS — BP 113/63 | HR 69 | Resp 18

## 2021-12-16 DIAGNOSIS — R110 Nausea: Secondary | ICD-10-CM | POA: Diagnosis not present

## 2021-12-16 DIAGNOSIS — T451X5A Adverse effect of antineoplastic and immunosuppressive drugs, initial encounter: Secondary | ICD-10-CM | POA: Insufficient documentation

## 2021-12-16 DIAGNOSIS — Z5111 Encounter for antineoplastic chemotherapy: Secondary | ICD-10-CM | POA: Insufficient documentation

## 2021-12-16 DIAGNOSIS — R197 Diarrhea, unspecified: Secondary | ICD-10-CM | POA: Insufficient documentation

## 2021-12-16 DIAGNOSIS — R21 Rash and other nonspecific skin eruption: Secondary | ICD-10-CM | POA: Insufficient documentation

## 2021-12-16 DIAGNOSIS — C50412 Malignant neoplasm of upper-outer quadrant of left female breast: Secondary | ICD-10-CM | POA: Insufficient documentation

## 2021-12-16 DIAGNOSIS — D6481 Anemia due to antineoplastic chemotherapy: Secondary | ICD-10-CM | POA: Insufficient documentation

## 2021-12-16 DIAGNOSIS — Z170 Estrogen receptor positive status [ER+]: Secondary | ICD-10-CM | POA: Diagnosis not present

## 2021-12-16 DIAGNOSIS — Z79899 Other long term (current) drug therapy: Secondary | ICD-10-CM | POA: Insufficient documentation

## 2021-12-16 LAB — CMP (CANCER CENTER ONLY)
ALT: 11 U/L (ref 0–44)
AST: 18 U/L (ref 15–41)
Albumin: 4.2 g/dL (ref 3.5–5.0)
Alkaline Phosphatase: 49 U/L (ref 38–126)
Anion gap: 9 (ref 5–15)
BUN: 14 mg/dL (ref 6–20)
CO2: 25 mmol/L (ref 22–32)
Calcium: 9.7 mg/dL (ref 8.9–10.3)
Chloride: 107 mmol/L (ref 98–111)
Creatinine: 0.68 mg/dL (ref 0.44–1.00)
GFR, Estimated: >60 mL/min (ref >60)
Glucose, Bld: 168 mg/dL — ABNORMAL HIGH (ref 70–99)
Potassium: 4 mmol/L (ref 3.5–5.1)
Sodium: 141 mmol/L (ref 135–145)
Total Bilirubin: 0.6 mg/dL (ref 0.3–1.2)
Total Protein: 6.7 g/dL (ref 6.5–8.1)

## 2021-12-16 LAB — CBC WITH DIFFERENTIAL (CANCER CENTER ONLY)
Abs Immature Granulocytes: 0.02 10*3/uL (ref 0.00–0.07)
Basophils Absolute: 0 10*3/uL (ref 0.0–0.1)
Basophils Relative: 0 %
Eosinophils Absolute: 0 10*3/uL (ref 0.0–0.5)
Eosinophils Relative: 0 %
HCT: 35.7 % — ABNORMAL LOW (ref 36.0–46.0)
Hemoglobin: 11.9 g/dL — ABNORMAL LOW (ref 12.0–15.0)
Immature Granulocytes: 0 %
Lymphocytes Relative: 9 %
Lymphs Abs: 0.8 10*3/uL (ref 0.7–4.0)
MCH: 26.2 pg (ref 26.0–34.0)
MCHC: 33.3 g/dL (ref 30.0–36.0)
MCV: 78.5 fL — ABNORMAL LOW (ref 80.0–100.0)
Monocytes Absolute: 0.1 10*3/uL (ref 0.1–1.0)
Monocytes Relative: 1 %
Neutro Abs: 7.9 10*3/uL — ABNORMAL HIGH (ref 1.7–7.7)
Neutrophils Relative %: 90 %
Platelet Count: 293 10*3/uL (ref 150–400)
RBC: 4.55 MIL/uL (ref 3.87–5.11)
RDW: 13.5 % (ref 11.5–15.5)
WBC Count: 8.9 10*3/uL (ref 4.0–10.5)
nRBC: 0 % (ref 0.0–0.2)

## 2021-12-16 MED ORDER — ACETAMINOPHEN 325 MG PO TABS
650.0000 mg | ORAL_TABLET | Freq: Once | ORAL | Status: AC
  Administered 2021-12-16: 650 mg via ORAL
  Filled 2021-12-16: qty 2

## 2021-12-16 MED ORDER — PALONOSETRON HCL INJECTION 0.25 MG/5ML
0.2500 mg | Freq: Once | INTRAVENOUS | Status: AC
  Administered 2021-12-16: 0.25 mg via INTRAVENOUS
  Filled 2021-12-16: qty 5

## 2021-12-16 MED ORDER — SODIUM CHLORIDE 0.9 % IV SOLN
670.0000 mg | Freq: Once | INTRAVENOUS | Status: AC
  Administered 2021-12-16: 670 mg via INTRAVENOUS
  Filled 2021-12-16: qty 67

## 2021-12-16 MED ORDER — SODIUM CHLORIDE 0.9 % IV SOLN
420.0000 mg | Freq: Once | INTRAVENOUS | Status: AC
  Administered 2021-12-16: 420 mg via INTRAVENOUS
  Filled 2021-12-16: qty 14

## 2021-12-16 MED ORDER — SODIUM CHLORIDE 0.9 % IV SOLN
75.0000 mg/m2 | Freq: Once | INTRAVENOUS | Status: AC
  Administered 2021-12-16: 140 mg via INTRAVENOUS
  Filled 2021-12-16: qty 14

## 2021-12-16 MED ORDER — SODIUM CHLORIDE 0.9 % IV SOLN
150.0000 mg | Freq: Once | INTRAVENOUS | Status: AC
  Administered 2021-12-16: 150 mg via INTRAVENOUS
  Filled 2021-12-16: qty 150

## 2021-12-16 MED ORDER — SODIUM CHLORIDE 0.9 % IV SOLN
10.0000 mg | Freq: Once | INTRAVENOUS | Status: AC
  Administered 2021-12-16: 10 mg via INTRAVENOUS
  Filled 2021-12-16: qty 10

## 2021-12-16 MED ORDER — DIPHENHYDRAMINE HCL 25 MG PO CAPS
50.0000 mg | ORAL_CAPSULE | Freq: Once | ORAL | Status: AC
  Administered 2021-12-16: 50 mg via ORAL
  Filled 2021-12-16: qty 2

## 2021-12-16 MED ORDER — SODIUM CHLORIDE 0.9% FLUSH
10.0000 mL | INTRAVENOUS | Status: DC | PRN
  Administered 2021-12-16: 10 mL

## 2021-12-16 MED ORDER — SODIUM CHLORIDE 0.9 % IV SOLN: Freq: Once | INTRAVENOUS | Status: AC

## 2021-12-16 MED ORDER — HEPARIN SOD (PORK) LOCK FLUSH 100 UNIT/ML IV SOLN
500.0000 [IU] | Freq: Once | INTRAVENOUS | Status: AC | PRN
  Administered 2021-12-16: 500 [IU]

## 2021-12-16 MED ORDER — TRASTUZUMAB-DKST CHEMO 150 MG IV SOLR
6.0000 mg/kg | Freq: Once | INTRAVENOUS | Status: AC
  Administered 2021-12-16: 420 mg via INTRAVENOUS
  Filled 2021-12-16: qty 20

## 2021-12-16 NOTE — Assessment & Plan Note (Signed)
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  Since last visit, she had breast MRI which showed tumor size measuring up to 3.6 cm.  No regional adenopathy.   CT and bone scan with no evidence of metastatic disease. Started C1 of TCHP 11/26/2021 She tolerated chemotherapy very well except for grade 1 diarrhea, grade 1 arthralgia secondary to the G-CSF. We will proceed with same dose of chemotherapy is cycle 1.  Labs reviewed and satisfactory to proceed with chemotherapy.  Clinically she has already been responding to chemotherapy, left breast mass less noticeable, smaller in size.  No palpable lymphadenopathy. She will return to clinic clinic before planned cycle 3 of TCHP.

## 2021-12-16 NOTE — Progress Notes (Signed)
Per Dr. Chryl Heck, will keep Carbo dose at 670 mg today (same dose as with Cycle 1).  Kennith Center, Pharm.D., CPP 12/16/2021'@12'$ :53 PM

## 2021-12-16 NOTE — Progress Notes (Signed)
[facility] NOTE  Patient Care Team: Sasser, Silvestre Moment, MD as PCP - General (Family Medicine) Erroll Luna, MD as Consulting Physician (General Surgery) Irene Limbo, MD as Consulting Physician (Plastic Surgery) Eppie Gibson, MD as Consulting Physician (Radiation Oncology) Rockwell Germany, RN as Oncology Nurse Navigator Mauro Kaufmann, RN as Oncology Nurse Navigator Azucena Fallen, MD as Consulting Physician (Obstetrics and Gynecology) Lavonna Monarch, MD (Inactive) as Consulting Physician (Dermatology) Azucena Fallen, MD as Consulting Physician (Obstetrics and Gynecology) Warren Danes, PA-C as Physician Assistant (Dermatology) Benay Pike, MD as Consulting Physician (Hematology and Oncology)  CHIEF COMPLAINTS/PURPOSE OF CONSULTATION:  Breast cancer follow up  HISTORY OF PRESENTING ILLNESS:  Stephanie [name] 36 y.o. female is here because of recent diagnosis of left breast cancer  I reviewed her records extensively and collaborated the history with the patient.  SUMMARY OF ONCOLOGIC HISTORY: Oncology History  Malignant neoplasm of upper-outer quadrant of female breast ([place]) (Resolved)  10/20/2018 Initial Diagnosis   Malignant neoplasm of upper-outer quadrant of left breast in female, estrogen receptor positive ([place])   10/20/2018 Cancer Staging   Staging form: Breast, AJCC 8th Edition - Clinical stage from 10/20/2018: Stage 0 (cTis (DCIS), cN0, cM0, ER+, PR-) - Signed by Chauncey Cruel, MD on 02/13/2020 Stage prefix: Initial diagnosis   11/03/2018 Genetic Testing   Negative genetic testing:  No pathogenic variants detected on the [facility] Multi-Cancer panel, ordered by Dr. Benjie Karvonen at [facility]. The report date is 11/03/2018.  The Multi-Cancer Panel offered by [facility] includes sequencing and/or deletion duplication testing of the following 84 genes: AIP, ALK, APC, ATM, AXIN2,BAP1,  BARD1, BLM, BMPR1A, BRCA1, BRCA2, BRIP1, CASR,  CDC73, CDH1, CDK4, CDKN1B, CDKN1C, CDKN2A (p14ARF), CDKN2A (p16INK4a), CEBPA, CHEK2, CTNNA1, DICER1, DIS3L2, EGFR (c.2369C>T, p.Thr790Met variant only), EPCAM (Deletion/duplication testing only), FH, FLCN, GATA2, GPC3, GREM1 (Promoter region deletion/duplication testing only), HOXB13 (c.251G>A, p.Gly84Glu), HRAS, KIT, MAX, MEN1, MET, MITF (c.952G>A, p.Glu318Lys variant only), MLH1, MSH2, MSH3, MSH6, MUTYH, NBN, NF1, NF2, NTHL1, PALB2, PDGFRA, PHOX2B, PMS2, POLD1, POLE, POT1, PRKAR1A, PTCH1, PTEN, RAD50, RAD51C, RAD51D, RB1, RECQL4, RET, RUNX1, SDHAF2, SDHA (sequence changes only), SDHB, SDHC, SDHD, SMAD4, SMARCA4, SMARCB1, SMARCE1, STK11, SUFU, TERC, TERT, TMEM127, TP53, TSC1, TSC2, VHL, WRN and WT1.     01/17/2019 Cancer Staging   Staging form: Breast, AJCC 8th Edition - Pathologic stage from 01/17/2019: Stage IA (pT58m, pN0, cM0, G2, ER+, PR-, HER2-) - Signed by CGardenia Phlegm NP on 02/01/2019   Malignant neoplasm of upper-outer quadrant of left breast in female, estrogen receptor positive ([place]  10/21/2021 Breast [place]  Breast ultrasound of the palpable mass showed hypoechoic oval vascular mass with indistinct margins measuring 1.5 x 1 cm x 1.4 cm.  No abnormal lymph nodes found in the left axilla.  She had ultrasound-guided biopsy.  MRI scheduled for tomorrow.   10/29/2021 Pathology Results   Surgical pathology from the left breast needle core biopsy at 130 o'clock showed invasive ductal carcinoma, overall grade 2, prognostics ER 70% positive weak to moderate staining, PR 0% negative, Ki-67 of 80% and HER2 3+   11/18/2021 Initial Diagnosis   Malignant neoplasm of upper-outer quadrant of left breast in female, estrogen receptor positive ([place]   11/18/2021 Cancer Staging   Staging form: Breast, AJCC 8th Edition - Clinical: Stage IA (cT1c, cN0, cM0, G2, ER+, PR-, HER2+) - Signed by IBenay Pike MD on 11/18/2021 Histologic grading system: 3 grade system   11/26/2021 -  Chemotherapy  Patient is on Treatment Plan : BREAST  Docetaxel + Carboplatin + Trastuzumab + Pertuzumab  (TCHP) q21d       Patient arrived to the appointment today with her husband. 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   She is here before planned cycle 2 of TCHP.  After the first cycle for about a week she did not feel her best.  She had severe arthralgias mostly in her hip and her back from the G-CSF.  She also had diarrhea about 4 times a day or less.  She had to take some Imodium periodically. She noticed mild nausea, no vomiting. No neuropathy.  She feels that the left breast mass is smaller. Some situational anxiety, otherwise doing well.  MEDICAL HISTORY:  Past Medical History:  Diagnosis Date   Atypical mole 03/14/2019   mod-left side superior   BRCA gene mutation negative in female    Breast cancer [facility])    Family history of breast cancer    Family history of lung cancer    Family history of rectal cancer    Melanoma ([place]) 10/14/2007   level II- right arm- ([facility])   PONV (postoperative nausea and vomiting)     SURGICAL HISTORY: Past Surgical History:  Procedure Laterality Date   BREAST RECONSTRUCTION WITH PLACEMENT OF TISSUE EXPANDER AND ALLODERM Bilateral 01/17/2019   Procedure: BILATERAL BREAST RECONSTRUCTION WITH PLACEMENT OF TISSUE EXPANDER AND ALLODERM;  Surgeon: Irene Limbo, MD;  Location: [place];  Service: Plastics;  Laterality: Bilateral;   IR IMAGING GUIDED PORT INSERTION  11/25/2021   IR RADIOLOGIST EVAL & MGMT  11/26/2021   LESION REMOVAL Left 06/02/2019   Procedure: MINOR EXICISION OF LESION,LAYERED CLOSURE 1 CM;  Surgeon: Irene Limbo, MD;  Location: [place];  Service: Plastics;  Laterality: Left;   LIPOSUCTION WITH LIPOFILLING Bilateral 06/02/2019   Procedure: LIPOFILLING FROM ABDOMEN TO  BILATERAL CHEST;  Surgeon: Irene Limbo, MD;  Location: [place];  Service: Plastics;  Laterality: Bilateral;   melanoma surgery Right 2009   upper right arm   NIPPLE SPARING MASTECTOMY WITH SENTINEL LYMPH NODE BIOPSY Bilateral 01/17/2019   Procedure: BILATERAL NIPPLE SPARING MASTECTOMIES WITH LEFT SENTINEL LYMPH NODE MAPPING;  Surgeon: Erroll Luna, MD;  Location: [place];  Service: General;  Laterality: Bilateral;   REMOVAL OF BILATERAL TISSUE EXPANDERS WITH PLACEMENT OF BILATERAL BREAST IMPLANTS Bilateral 06/02/2019   Procedure: REMOVAL OF BILATERAL TISSUE EXPANDERS WITH PLACEMENT OF BILATERAL SILICONE BREAST IMPLANTS;  Surgeon: Irene Limbo, MD;  Location: [facility];  Service: Plastics;  Laterality: Bilateral;   WISDOM TOOTH EXTRACTION      SOCIAL HISTORY: Social History   Socioeconomic History   Marital status: Married    Spouse name: Not on file   Number of children: Not on file   Years of education: Not on file   Highest education level: Not on file  Occupational History   Not on file  Tobacco Use   Smoking status: Never   Smokeless tobacco: Never  Vaping Use   Vaping Use: Never used  Substance and Sexual Activity   Alcohol use: Yes    Comment: occasional   Drug use: Never   Sexual activity: Yes    Birth control/protection: Pill  Other Topics Concern   Not on file  Social History Narrative   Not on file  Social Determinants of Health   Financial Resource Strain: Not on file  Food Insecurity: Not on file  Transportation Needs: No Transportation Needs (11/09/2018)   PRAPARE - Hydrologist (Medical): No    Lack of Transportation (Non-Medical): No  Physical Activity: Not on file  Stress: Not on file  Social Connections: Not on file  Intimate Partner Violence: Not At Risk (11/09/2018)   Humiliation, Afraid, Rape, and Kick questionnaire    Fear of Current or Ex-Partner: No     Emotionally Abused: No    Physically Abused: No    Sexually Abused: No    FAMILY HISTORY: Family History  Problem Relation Age of Onset   Hypertension Mother    Heart disease Father    Breast cancer Maternal Aunt 60 - 69   Breast cancer Maternal Aunt 65 - 59   Lung cancer Paternal Aunt        diagnosed late 11s   Breast cancer Maternal Grandmother 60 - 69   Breast cancer Paternal Grandmother 60 - 8   Lung cancer Paternal Grandmother        thought to be breast cancer mets   Rectal cancer Paternal Grandmother        diagnosed 53s   Lung cancer Paternal Grandfather        diagnosed 20s    ALLERGIES:  is allergic to oxycodone.  MEDICATIONS:  Current Outpatient Medications  Medication Sig Dispense Refill   ALPRAZolam (XANAX) 0.5 MG tablet Take 1 tablet (0.5 mg total) by mouth 3 (three) times daily as needed for anxiety. 20 tablet 0   cetirizine (ZYRTEC) 10 MG tablet Take 10 mg by mouth daily.      ciprofloxacin (CIPRO) 500 MG tablet Take 1 tablet (500 mg total) by mouth 2 (two) times daily. 10 tablet 0   dexamethasone (DECADRON) 4 MG tablet Take 2 tabs by mouth 2 times daily starting day before chemo. Then take 2 tabs daily for 2 days starting day after chemo. Take with food. 30 tablet 1   lidocaine-prilocaine (EMLA) cream Apply to affected area once 30 g 3   meclizine (ANTIVERT) 25 MG tablet Take 25 mg by mouth 3 (three) times daily as needed (Vertigo/Motion Sickness).     Multiple Vitamins-Minerals (MULTIVITAMIN WITH MINERALS) tablet Take 1 tablet by mouth daily.     ondansetron (ZOFRAN) 8 MG tablet Take 1 tablet (8 mg total) by mouth every 8 (eight) hours as needed for nausea or vomiting. Start on the third day after chemotherapy. 30 tablet 1   prochlorperazine (COMPAZINE) 10 MG tablet Take 1 tablet (10 mg total) by mouth every 6 (six) hours as needed for nausea or vomiting. 30 tablet 1   No current facility-administered medications for this visit.   Facility-Administered  Medications Ordered in Other Visits  Medication Dose Route Frequency Provider Last Rate Last Admin   sodium chloride flush (NS) 0.9 % injection 10 mL  10 mL Intracatheter PRN Benay Pike, MD   10 mL at 12/16/21 1518    REVIEW OF SYSTEMS:   Constitutional: Denies fevers, chills or abnormal night sweats Eyes: Denies blurriness of vision, double vision or watery eyes Ears, nose, mouth, throat, and face: Denies mucositis or sore throat Respiratory: Denies cough, dyspnea or wheezes Cardiovascular: Denies palpitation, chest discomfort or lower extremity swelling Gastrointestinal:  Denies nausea, heartburn or change in bowel habits Skin: Denies abnormal skin rashes Lymphatics: Denies new lymphadenopathy or easy bruising Neurological:Denies numbness, tingling or new  weaknesses Behavioral/Psych: Mood is stable, no new changes  Breast:  Denies any palpable lumps or discharge All other systems were reviewed with the patient and are negative.  PHYSICAL EXAMINATION: ECOG PERFORMANCE STATUS: 0 - Asymptomatic  Vitals:   12/16/21 0955  BP: 111/69  Pulse: 72  Resp: 16  Temp: 97.7 F (36.5 C)  SpO2: 100%   Filed Weights   12/16/21 0955  Weight: 151 lb 6.4 oz (68.7 kg)    GENERAL:alert, no distress and comfortable BREAST: Palpable left breast mass upper outer quadrant significantly smaller compared to last visit. Rest of the breasts bilaterally appear normal. No change compared to last visit. Chest: CTA bilaterally Extremities: No edema.  LABORATORY DATA:  I have reviewed the data as listed Lab Results  Component Value Date   WBC 8.9 12/16/2021   HGB 11.9 (L) 12/16/2021   HCT 35.7 (L) 12/16/2021   MCV 78.5 (L) 12/16/2021   PLT 293 12/16/2021   Lab Results  Component Value Date   NA 141 12/16/2021   K 4.0 12/16/2021   CL 107 12/16/2021   CO2 25 12/16/2021    RADIOGRAPHIC STUDIES: I have personally reviewed the radiological reports and agreed with the findings in the  report.  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  Since last visit, she had breast MRI which showed tumor size measuring up to 3.6 cm.  No regional adenopathy.   CT and bone scan with no evidence of metastatic disease. Started C1 of TCHP 11/26/2021 She tolerated chemotherapy very well except for grade 1 diarrhea, grade 1 arthralgia secondary to the G-CSF. We will proceed with same dose of chemotherapy is cycle 1.  Labs reviewed and satisfactory to proceed with chemotherapy.  Clinically she has already been responding to chemotherapy, left breast mass less noticeable, smaller in size.  No palpable lymphadenopathy. She will return to clinic clinic before planned cycle 3 of TCHP.  Total time spent 30 minutes including history, physical exam, review of records, counseling and coordination of care All questions were answered. The patient knows to call the clinic with any problems, questions or concerns.    Benay Pike, MD 12/16/21

## 2021-12-18 ENCOUNTER — Inpatient Hospital Stay: Payer: BC Managed Care – PPO

## 2021-12-18 VITALS — BP 111/70 | HR 56 | Temp 98.6°F | Resp 16

## 2021-12-18 DIAGNOSIS — C50412 Malignant neoplasm of upper-outer quadrant of left female breast: Secondary | ICD-10-CM | POA: Diagnosis not present

## 2021-12-18 DIAGNOSIS — Z170 Estrogen receptor positive status [ER+]: Secondary | ICD-10-CM

## 2021-12-18 MED ORDER — PEGFILGRASTIM-CBQV 6 MG/0.6ML ~~LOC~~ SOSY
6.0000 mg | PREFILLED_SYRINGE | Freq: Once | SUBCUTANEOUS | Status: AC
  Administered 2021-12-18: 6 mg via SUBCUTANEOUS
  Filled 2021-12-18: qty 0.6

## 2021-12-22 ENCOUNTER — Telehealth: Payer: Self-pay | Admitting: Hematology and Oncology

## 2021-12-22 ENCOUNTER — Telehealth: Payer: Self-pay

## 2021-12-22 ENCOUNTER — Other Ambulatory Visit: Payer: Self-pay | Admitting: Hematology and Oncology

## 2021-12-22 DIAGNOSIS — Z170 Estrogen receptor positive status [ER+]: Secondary | ICD-10-CM

## 2021-12-22 NOTE — Telephone Encounter (Signed)
Called patient to notify of upcoming appointments. Left voicemail with appointment information

## 2021-12-22 NOTE — Telephone Encounter (Signed)
Patient called requesting next infusion appointments. Spoke with Dr. Chryl Heck and Vikki Ports (scheduler). Patient will send my chart message if appointments are not provided by 12/23/21. Patient eager/anxious to get the appointments in place.

## 2021-12-22 NOTE — Progress Notes (Signed)
Chemotherapy orders signed.

## 2021-12-23 ENCOUNTER — Other Ambulatory Visit: Payer: Self-pay

## 2021-12-25 ENCOUNTER — Encounter: Payer: Self-pay | Admitting: *Deleted

## 2021-12-30 ENCOUNTER — Encounter: Payer: Self-pay | Admitting: Hematology and Oncology

## 2021-12-31 ENCOUNTER — Telehealth: Payer: Self-pay

## 2021-12-31 ENCOUNTER — Other Ambulatory Visit: Payer: Self-pay

## 2021-12-31 ENCOUNTER — Encounter: Payer: Self-pay | Admitting: Hematology and Oncology

## 2021-12-31 MED ORDER — DOXYCYCLINE HYCLATE 100 MG PO TABS: 100.0000 mg | ORAL_TABLET | Freq: Two times a day (BID) | ORAL | 2 refills | Status: DC

## 2021-12-31 NOTE — Telephone Encounter (Signed)
Per Dr. Chryl Heck, spoke with patient in response to my-chart message from 12/30/21. Dr. Chryl Heck believes that rash/acne is related to chemo tx . Prescription for antibiotic sent to patient's pharmacy. Patient verbalized understanding.

## 2022-01-02 MED FILL — Fosaprepitant Dimeglumine For IV Infusion 150 MG (Base Eq): INTRAVENOUS | Qty: 5 | Status: AC

## 2022-01-02 MED FILL — Dexamethasone Sodium Phosphate Inj 100 MG/10ML: INTRAMUSCULAR | Qty: 1 | Status: AC

## 2022-01-06 ENCOUNTER — Inpatient Hospital Stay: Payer: BC Managed Care – PPO

## 2022-01-06 ENCOUNTER — Inpatient Hospital Stay (HOSPITAL_BASED_OUTPATIENT_CLINIC_OR_DEPARTMENT_OTHER): Payer: BC Managed Care – PPO | Admitting: Physician Assistant

## 2022-01-06 VITALS — BP 109/71 | HR 68 | Temp 97.7°F | Resp 16 | Ht 66.0 in | Wt 152.5 lb

## 2022-01-06 DIAGNOSIS — Z5111 Encounter for antineoplastic chemotherapy: Secondary | ICD-10-CM

## 2022-01-06 DIAGNOSIS — C50412 Malignant neoplasm of upper-outer quadrant of left female breast: Secondary | ICD-10-CM

## 2022-01-06 DIAGNOSIS — Z170 Estrogen receptor positive status [ER+]: Secondary | ICD-10-CM

## 2022-01-06 LAB — CBC WITH DIFFERENTIAL (CANCER CENTER ONLY)
Abs Immature Granulocytes: 0.04 10*3/uL (ref 0.00–0.07)
Basophils Absolute: 0 10*3/uL (ref 0.0–0.1)
Basophils Relative: 0 %
Eosinophils Absolute: 0 10*3/uL (ref 0.0–0.5)
Eosinophils Relative: 0 %
HCT: 32.2 % — ABNORMAL LOW (ref 36.0–46.0)
Hemoglobin: 10.6 g/dL — ABNORMAL LOW (ref 12.0–15.0)
Immature Granulocytes: 1 %
Lymphocytes Relative: 8 %
Lymphs Abs: 0.7 10*3/uL (ref 0.7–4.0)
MCH: 26.4 pg (ref 26.0–34.0)
MCHC: 32.9 g/dL (ref 30.0–36.0)
MCV: 80.1 fL (ref 80.0–100.0)
Monocytes Absolute: 0.2 10*3/uL (ref 0.1–1.0)
Monocytes Relative: 2 %
Neutro Abs: 7.7 10*3/uL (ref 1.7–7.7)
Neutrophils Relative %: 89 %
Platelet Count: 246 10*3/uL (ref 150–400)
RBC: 4.02 MIL/uL (ref 3.87–5.11)
RDW: 15.6 % — ABNORMAL HIGH (ref 11.5–15.5)
WBC Count: 8.6 10*3/uL (ref 4.0–10.5)
nRBC: 0 % (ref 0.0–0.2)

## 2022-01-06 LAB — CMP (CANCER CENTER ONLY)
ALT: 12 U/L (ref 0–44)
AST: 14 U/L — ABNORMAL LOW (ref 15–41)
Albumin: 4.1 g/dL (ref 3.5–5.0)
Alkaline Phosphatase: 59 U/L (ref 38–126)
Anion gap: 8 (ref 5–15)
BUN: 13 mg/dL (ref 6–20)
CO2: 25 mmol/L (ref 22–32)
Calcium: 9.5 mg/dL (ref 8.9–10.3)
Chloride: 105 mmol/L (ref 98–111)
Creatinine: 0.69 mg/dL (ref 0.44–1.00)
GFR, Estimated: >60 mL/min (ref >60)
Glucose, Bld: 174 mg/dL — ABNORMAL HIGH (ref 70–99)
Potassium: 3.9 mmol/L (ref 3.5–5.1)
Sodium: 138 mmol/L (ref 135–145)
Total Bilirubin: 0.4 mg/dL (ref 0.3–1.2)
Total Protein: 6.5 g/dL (ref 6.5–8.1)

## 2022-01-06 MED ORDER — SODIUM CHLORIDE 0.9 % IV SOLN
75.0000 mg/m2 | Freq: Once | INTRAVENOUS | Status: AC
  Administered 2022-01-06: 140 mg via INTRAVENOUS
  Filled 2022-01-06: qty 14

## 2022-01-06 MED ORDER — TRASTUZUMAB-DKST CHEMO 150 MG IV SOLR
6.0000 mg/kg | Freq: Once | INTRAVENOUS | Status: AC
  Administered 2022-01-06: 420 mg via INTRAVENOUS
  Filled 2022-01-06: qty 20

## 2022-01-06 MED ORDER — SODIUM CHLORIDE 0.9 % IV SOLN
10.0000 mg | Freq: Once | INTRAVENOUS | Status: AC
  Administered 2022-01-06: 10 mg via INTRAVENOUS
  Filled 2022-01-06: qty 10

## 2022-01-06 MED ORDER — HEPARIN SOD (PORK) LOCK FLUSH 100 UNIT/ML IV SOLN
500.0000 [IU] | Freq: Once | INTRAVENOUS | Status: AC | PRN
  Administered 2022-01-06: 500 [IU]

## 2022-01-06 MED ORDER — SODIUM CHLORIDE 0.9 % IV SOLN
150.0000 mg | Freq: Once | INTRAVENOUS | Status: AC
  Administered 2022-01-06: 150 mg via INTRAVENOUS
  Filled 2022-01-06: qty 150

## 2022-01-06 MED ORDER — SODIUM CHLORIDE 0.9 % IV SOLN
420.0000 mg | Freq: Once | INTRAVENOUS | Status: AC
  Administered 2022-01-06: 420 mg via INTRAVENOUS
  Filled 2022-01-06: qty 14

## 2022-01-06 MED ORDER — SODIUM CHLORIDE 0.9 % IV SOLN: Freq: Once | INTRAVENOUS | Status: AC

## 2022-01-06 MED ORDER — PALONOSETRON HCL INJECTION 0.25 MG/5ML
0.2500 mg | Freq: Once | INTRAVENOUS | Status: AC
  Administered 2022-01-06: 0.25 mg via INTRAVENOUS
  Filled 2022-01-06: qty 5

## 2022-01-06 MED ORDER — SODIUM CHLORIDE 0.9% FLUSH
10.0000 mL | INTRAVENOUS | Status: DC | PRN
  Administered 2022-01-06: 10 mL via INTRAVENOUS

## 2022-01-06 MED ORDER — DIPHENHYDRAMINE HCL 25 MG PO CAPS
50.0000 mg | ORAL_CAPSULE | Freq: Once | ORAL | Status: AC
  Administered 2022-01-06: 50 mg via ORAL
  Filled 2022-01-06: qty 2

## 2022-01-06 MED ORDER — ACETAMINOPHEN 325 MG PO TABS
650.0000 mg | ORAL_TABLET | Freq: Once | ORAL | Status: AC
  Administered 2022-01-06: 650 mg via ORAL
  Filled 2022-01-06: qty 2

## 2022-01-06 MED ORDER — SODIUM CHLORIDE 0.9 % IV SOLN
670.0000 mg | Freq: Once | INTRAVENOUS | Status: AC
  Administered 2022-01-06: 670 mg via INTRAVENOUS
  Filled 2022-01-06: qty 67

## 2022-01-06 MED ORDER — SODIUM CHLORIDE 0.9% FLUSH
10.0000 mL | INTRAVENOUS | Status: DC | PRN
  Administered 2022-01-06: 10 mL

## 2022-01-06 NOTE — Progress Notes (Signed)
Ok to [employer] dose to '670mg'$  per Dr Lorenso Courier (on call MD)

## 2022-01-06 NOTE — Progress Notes (Signed)
[name] PROGRESS NOTE  Patient Care Team: Sasser, Silvestre Moment, MD as PCP - General (Family Medicine) Erroll Luna, MD as Consulting Physician (General Surgery) Kweli Grassel Limbo, MD as Consulting Physician (Plastic Surgery) Eppie Gibson, MD as Consulting Physician (Radiation Oncology) Rockwell Germany, RN as Oncology Nurse Navigator Mauro Kaufmann, RN as Oncology Nurse Navigator Azucena Fallen, MD as Consulting Physician (Obstetrics and Gynecology) Lavonna Monarch, MD (Inactive) as Consulting Physician (Dermatology) Azucena Fallen, MD as Consulting Physician (Obstetrics and Gynecology) Warren Danes, PA-C as Physician Assistant (Dermatology) Benay Pike, MD as Consulting Physician (Hematology and Oncology)  CHIEF COMPLAINTS/PURPOSE OF CONSULTATION:  Breast cancer follow up  SUMMARY OF ONCOLOGIC HISTORY: Oncology History  Malignant neoplasm of upper-outer quadrant of female breast ([place]) (Resolved)  10/20/2018 Initial Diagnosis   Malignant neoplasm of upper-outer quadrant of left breast in female, estrogen receptor positive ([place])   10/20/2018 Cancer Staging   Staging form: Breast, AJCC 8th Edition - Clinical stage from 10/20/2018: Stage 0 (cTis (DCIS), cN0, cM0, ER+, PR-) - Signed by Chauncey Cruel, MD on 02/13/2020 Stage prefix: Initial diagnosis   11/03/2018 Genetic Testing   Negative genetic testing:  No pathogenic variants detected on the [facility] Multi-Cancer panel, ordered by Dr. Benjie Karvonen at [facility]. The report date is 11/03/2018.  The Multi-Cancer Panel offered by [facility] includes sequencing and/or deletion duplication testing of the following 84 genes: AIP, ALK, APC, ATM, AXIN2,BAP1,  BARD1, BLM, BMPR1A, BRCA1, BRCA2, BRIP1, CASR, CDC73, CDH1, CDK4, CDKN1B, CDKN1C, CDKN2A (p14ARF), CDKN2A (p16INK4a), CEBPA, CHEK2, CTNNA1, DICER1, DIS3L2, EGFR (c.2369C>T, p.Thr790Met variant only), EPCAM (Deletion/duplication testing only), FH, FLCN,  GATA2, GPC3, GREM1 (Promoter region deletion/duplication testing only), HOXB13 (c.251G>A, p.Gly84Glu), HRAS, KIT, MAX, MEN1, MET, MITF (c.952G>A, p.Glu318Lys variant only), MLH1, MSH2, MSH3, MSH6, MUTYH, NBN, NF1, NF2, NTHL1, PALB2, PDGFRA, PHOX2B, PMS2, POLD1, POLE, POT1, PRKAR1A, PTCH1, PTEN, RAD50, RAD51C, RAD51D, RB1, RECQL4, RET, RUNX1, SDHAF2, SDHA (sequence changes only), SDHB, SDHC, SDHD, SMAD4, SMARCA4, SMARCB1, SMARCE1, STK11, SUFU, TERC, TERT, TMEM127, TP53, TSC1, TSC2, VHL, WRN and WT1.     01/17/2019 Cancer Staging   Staging form: Breast, AJCC 8th Edition - Pathologic stage from 01/17/2019: Stage IA (pT31m, pN0, cM0, G2, ER+, PR-, HER2-) - Signed by CGardenia Phlegm NP on 02/01/2019   Malignant neoplasm of upper-outer quadrant of left breast in female, estrogen receptor positive ([place]  10/21/2021 Breast [place]  Breast ultrasound of the palpable mass showed hypoechoic oval vascular mass with indistinct margins measuring 1.5 x 1 cm x 1.4 cm.  No abnormal lymph nodes found in the left axilla.  She had ultrasound-guided biopsy.  MRI scheduled for tomorrow.   10/29/2021 Pathology Results   Surgical pathology from the left breast needle core biopsy at 130 o'clock showed invasive ductal carcinoma, overall grade 2, prognostics ER 70% positive weak to moderate staining, PR 0% negative, Ki-67 of 80% and HER2 3+   11/18/2021 Initial Diagnosis   Malignant neoplasm of upper-outer quadrant of left breast in female, estrogen receptor positive ([place]   11/18/2021 Cancer Staging   Staging form: Breast, AJCC 8th Edition - Clinical: Stage IA (cT1c, cN0, cM0, G2, ER+, PR-, HER2+) - Signed by IBenay Pike MD on 11/18/2021 Histologic grading system: 3 grade system   11/26/2021 -  Chemotherapy   Patient is on Treatment Plan : BREAST  Docetaxel + Carboplatin + Trastuzumab + Pertuzumab  (TCHP) q21d       INTERIM HISTORY: Stephanie [name] is a 36y.o. female who returns  for a follow up for breast  cancer. She was last seen by Dr. Chryl Heck on 12/16/2021. In the interim, she completed Cycle 2 of TCHP.   Today, Stephanie [name] is accompanied by her husband for this visit. She reports developing an acneiform rash after the last cycle involving her scalp, chest and face. She was prescribed doxycycline which resolved the rash. She reports having stable fatigue that last approximately six days after chemotherapy. She tries to do her ADLs on those days and rests frequently. She tries to exercise and work when her energy levels improve. She reports her appetite is overall stable. She did experience more nausea which was controlled with her antiemetics. She denies any vomiting episodes. She continues to have diarrhea starting day 3 and lasting for 5-6 days. She has approximately 3-4 episodes of diarrhea per day. She takes imodium as needed with improvement of symptoms. She denies easy bruising or signs of bleeding. She reports intermittent episodes of ankle swelling and tingling in her feet that resolved. She denies any interference with balance or ambulation. She denies any new or worsening breast pain. She reports two episodes of night sweats since starting chemotherapy. She denies fevers, chills, shortness of breath, chest pain or cough. She has no other complaints.   Rest of the ROS was reviewed and negative.   MEDICAL HISTORY:  Past Medical History:  Diagnosis Date   Atypical mole 03/14/2019   mod-left side superior   BRCA gene mutation negative in female    Breast cancer [facility])    Family history of breast cancer    Family history of lung cancer    Family history of rectal cancer    Melanoma ([place]) 10/14/2007   level II- right arm- ([facility])   PONV (postoperative nausea and vomiting)     SURGICAL HISTORY: Past Surgical History:  Procedure Laterality Date   BREAST RECONSTRUCTION WITH PLACEMENT OF TISSUE EXPANDER AND ALLODERM Bilateral 01/17/2019   Procedure: BILATERAL BREAST RECONSTRUCTION WITH PLACEMENT OF  TISSUE EXPANDER AND ALLODERM;  Surgeon: Sneijder Bernards Limbo, MD;  Location: [place];  Service: Plastics;  Laterality: Bilateral;   IR IMAGING GUIDED PORT INSERTION  11/25/2021   IR RADIOLOGIST EVAL & MGMT  11/26/2021   LESION REMOVAL Left 06/02/2019   Procedure: MINOR EXICISION OF LESION,LAYERED CLOSURE 1 CM;  Surgeon: Anaijah Augsburger Limbo, MD;  Location: [facility];  Service: Plastics;  Laterality: Left;   LIPOSUCTION WITH LIPOFILLING Bilateral 06/02/2019   Procedure: LIPOFILLING FROM ABDOMEN TO BILATERAL CHEST;  Surgeon: Ondrea Dow Limbo, MD;  Location: [facility];  Service: Plastics;  Laterality: Bilateral;   melanoma surgery Right 2009   upper right arm   NIPPLE SPARING MASTECTOMY WITH SENTINEL LYMPH NODE BIOPSY Bilateral 01/17/2019   Procedure: BILATERAL NIPPLE SPARING MASTECTOMIES WITH LEFT SENTINEL LYMPH NODE MAPPING;  Surgeon: Erroll Luna, MD;  Location: [facility];  Service: General;  Laterality: Bilateral;   REMOVAL OF BILATERAL TISSUE EXPANDERS WITH PLACEMENT OF BILATERAL BREAST IMPLANTS Bilateral 06/02/2019   Procedure: REMOVAL OF BILATERAL TISSUE EXPANDERS WITH PLACEMENT OF BILATERAL SILICONE BREAST IMPLANTS;  Surgeon: Kamyah Wilhelmsen Limbo, MD;  Location: [facility];  Service: Plastics;  Laterality: Bilateral;   WISDOM TOOTH EXTRACTION      SOCIAL HISTORY: Social History   Socioeconomic History   Marital status: Married    Spouse name: Not on file   Number of children: Not on file   Years of education: Not on file   Highest education level: Not on  file  Occupational History   Not on file  Tobacco Use   Smoking status: Never   Smokeless tobacco: Never  Vaping Use   Vaping Use: Never used  Substance and Sexual Activity   Alcohol use: Yes    Comment: occasional   Drug use: Never   Sexual activity: Yes    Birth control/protection: Pill  Other Topics Concern   Not on file  Social History Narrative    Not on file   Social Determinants of Health   Financial Resource Strain: Not on file  Food Insecurity: Not on file  Transportation Needs: No Transportation Needs (11/09/2018)   PRAPARE - Transportation    Lack of Transportation (Medical): No    Lack of Transportation (Non-Medical): No  Physical Activity: Not on file  Stress: Not on file  Social Connections: Not on file  Intimate Partner Violence: Not At Risk (11/09/2018)   Humiliation, Afraid, Rape, and Kick questionnaire    Fear of Current or Ex-Partner: No    Emotionally Abused: No    Physically Abused: No    Sexually Abused: No    FAMILY HISTORY: Family History  Problem Relation Age of Onset   Hypertension Mother    Heart disease Father    Breast cancer Maternal Aunt 60 - 69   Breast cancer Maternal Aunt 32 - 59   Lung cancer Paternal Aunt        diagnosed late 28s   Breast cancer Maternal Grandmother 60 - 69   Breast cancer Paternal Grandmother 60 - 76   Lung cancer Paternal Grandmother        thought to be breast cancer mets   Rectal cancer Paternal Grandmother        diagnosed 62s   Lung cancer Paternal Grandfather        diagnosed 37s    ALLERGIES:  is allergic to oxycodone.  MEDICATIONS:  Current Outpatient Medications  Medication Sig Dispense Refill   ALPRAZolam (XANAX) 0.5 MG tablet Take 1 tablet (0.5 mg total) by mouth 3 (three) times daily as needed for anxiety. 20 tablet 0   cetirizine (ZYRTEC) 10 MG tablet Take 10 mg by mouth daily.      ciprofloxacin (CIPRO) 500 MG tablet Take 1 tablet (500 mg total) by mouth 2 (two) times daily. 10 tablet 0   dexamethasone (DECADRON) 4 MG tablet Take 2 tabs by mouth 2 times daily starting day before chemo. Then take 2 tabs daily for 2 days starting day after chemo. Take with food. 30 tablet 1   doxycycline (VIBRA-TABS) 100 MG tablet Take 1 tablet (100 mg total) by mouth 2 (two) times daily. 30 tablet 2   lidocaine-prilocaine (EMLA) cream Apply to affected area once 30  g 3   meclizine (ANTIVERT) 25 MG tablet Take 25 mg by mouth 3 (three) times daily as needed (Vertigo/Motion Sickness).     Multiple Vitamins-Minerals (MULTIVITAMIN WITH MINERALS) tablet Take 1 tablet by mouth daily.     ondansetron (ZOFRAN) 8 MG tablet Take 1 tablet (8 mg total) by mouth every 8 (eight) hours as needed for nausea or vomiting. Start on the third day after chemotherapy. 30 tablet 1   prochlorperazine (COMPAZINE) 10 MG tablet Take 1 tablet (10 mg total) by mouth every 6 (six) hours as needed for nausea or vomiting. 30 tablet 1   No current facility-administered medications for this visit.    PHYSICAL EXAMINATION: ECOG PERFORMANCE STATUS: 1 - Symptomatic but completely ambulatory  There were no vitals  filed for this visit.  There were no vitals filed for this visit.   [ Day 1 ] 01/06/22  Height _0  (1.676 m)  Weight 152 lb 8 oz (69.2 kg)  BSA (Calculated - sq m) 1.79 sq meters  Temp 97.7 F (36.5 C)  Temp src Oral  Pulse 68  Resp 16  BP 109/71    Constitutional: Oriented to person, place, and time and well-developed, well-nourished, and in no distress.  HENT:  Head: Normocephalic and atraumatic.  Eyes: Conjunctivae are normal. Right eye exhibits no discharge. Left eye exhibits no discharge. No scleral icterus.    Cardiovascular: Normal rate, regular rhythm, normal heart sounds  Pulmonary/Chest: Effort normal and breath sounds normal. No respiratory distress. No wheezes. No rales.  Musculoskeletal: Normal range of motion. Exhibits no edema.  Lymphadenopathy: No cervical adenopathy.  Neurological: Alert and oriented to person, place, and time. Exhibits normal muscle tone.  Skin: Skin is warm and dry. No rash noted. Not diaphoretic. No erythema. No pallor.  Psychiatric: Mood, memory and judgment normal.  Breast: Deferred as visit was complete in infusion area.    LABORATORY DATA:  I have reviewed the data as listed Lab Results  Component Value Date   WBC 8.6  01/06/2022   HGB 10.6 (L) 01/06/2022   HCT 32.2 (L) 01/06/2022   MCV 80.1 01/06/2022   PLT 246 01/06/2022   Lab Results  Component Value Date   NA 138 01/06/2022   K 3.9 01/06/2022   CL 105 01/06/2022   CO2 25 01/06/2022    RADIOGRAPHIC STUDIES: I have personally reviewed the radiological reports and agreed with the findings in the report.  ASSESSMENT AND PLAN: Stephanie [name] is a 36 y.o. female who presents to the clinic for follow up for left breast cancer.   #Malignant neoplasm of upper-outer quadrant of left breast: -Breast US from 10/21/2021 showed showed hypoechoic oval vascular mass with indistinct margins measuring 1.5 x 1 cm x 1.4 cm.  -Grade 2, ER +70% weak to moderate staining, PR 0% negative, HER2 3+ by IHC, Ki-67 of 80%  -Baseline breast MRI from 11/19/2021 showed tumor size measuring up to 3.6 cm. No regional adenopathy. CT CAP and bone scan from 12/03/2021 showed no evidence of metastatic disease. -Recommended neoadjuvant chemotherapy with TCHP, started on 11/26/2021 -Due for Cycle 3, Day 1 of TCHP. Labs from today were reviewed and adequate for treatment. Worsening anemia secondary to chemotherapy with Hgb 10.6. No other cytopenias. Creatinine and LFTs normal. No prohibitive toxicities from chemotherapy -Proceed with treatment today without any dose modifications. -RTC in 3 weeks for labs, f/u visit with Dr. Chryl Heck before Cycle 4.  #Diarrhea: --Secondary to chemotherapy --Encouraged to use imodium with each episode and contact clinic if she has more than 4 episodes per day.   #Nausea without vomiting:  --Secondary to chemotherapy --Encouraged to take prescribed antiemetics as directed.   #Acneiform rash: --Involving scalp, chest and neck. 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  -Underwent bilateral nipple sparing  mastectomies with left sentinel lymph node sampling .  -Pathology: (a) on the right, no evidence of malignancy (b) on the left, pT71m pN0, stage IA invasive carcinoma, grade 2, with negative margins          (i) four left axillary sentinel nodes removed          (ii) ductal carcinoma  in situ, grade 3, also present, with negative margins          (iii) insufficient invasive tissue for prognostic panel evaluation  -Patient opted against anti-estrogen therapy which was optional.  -Adjuvant radiation was not indicated.    All questions were answered. The patient knows to call the clinic with any problems, questions or concerns.  I have spent a total of 30 minutes minutes of face-to-face and non-face-to-face time, preparing to see the patient, performing a medically appropriate examination, counseling and educating the patient, documenting clinical information in the electronic health record,and care coordination.   Dede Query PA-C Dept of Hematology and [place] at [facility] Phone: [phone]

## 2022-01-06 NOTE — Patient Instructions (Addendum)
[facility] ONCOLOGY  Discharge Instructions: Thank you for choosing [facility] to provide your oncology and hematology care.   If you have a lab appointment with the [facility], please go directly to the [facility] and check in at the registration area.   Wear comfortable clothing and clothing appropriate for easy access to any Portacath or PICC line.   We strive to give you quality time with your provider. You may need to reschedule your appointment if you arrive late (15 or more minutes).  Arriving late affects you and other patients whose appointments are after yours.  Also, if you miss three or more appointments without notifying the office, you may be dismissed from the clinic at the provider's discretion.      For prescription refill requests, have your pharmacy contact our office and allow 72 hours for refills to be completed.    Today you received the following chemotherapy and/or immunotherapy agents herceptin, perjeta, taxol, carboplatin   To help prevent nausea and vomiting after your treatment, we encourage you to take your nausea medication as directed.  BELOW ARE SYMPTOMS THAT SHOULD BE REPORTED IMMEDIATELY: *FEVER GREATER THAN 100.4 F (38 C) OR HIGHER *CHILLS OR SWEATING *NAUSEA AND VOMITING THAT IS NOT CONTROLLED WITH YOUR NAUSEA MEDICATION *UNUSUAL SHORTNESS OF BREATH *UNUSUAL BRUISING OR BLEEDING *URINARY PROBLEMS (pain or burning when urinating, or frequent urination) *BOWEL PROBLEMS (unusual diarrhea, constipation, pain near the anus) TENDERNESS IN MOUTH AND THROAT WITH OR WITHOUT PRESENCE OF ULCERS (sore throat, sores in mouth, or a toothache) UNUSUAL RASH, SWELLING OR PAIN  UNUSUAL VAGINAL DISCHARGE OR ITCHING   Items with * indicate a potential emergency and should be followed up as soon as possible or go to the Emergency Department if any problems should occur.  Please show the CHEMOTHERAPY ALERT CARD or IMMUNOTHERAPY  ALERT CARD at check-in to the Emergency Department and triage nurse.  Should you have questions after your visit or need to cancel or reschedule your appointment, please contact [facility]  Dept: [phone]  and follow the prompts.  Office hours are 8:00 a.m. to 4:30 p.m. Monday - Friday. Please note that voicemails left after 4:00 p.m. may not be returned until the following business day.  We are closed weekends and major holidays. You have access to a nurse at all times for urgent questions. Please call the main number to the clinic Dept: [phone] and follow the prompts.   For any non-urgent questions, you may also contact your provider using MyChart. We now offer e-Visits for anyone 32 and older to request care online for non-urgent symptoms. For details visit [email or web address].   Also download the MyChart app! Go to the app store, search "MyChart", open the app, select [facility], and log in with your MyChart username and password.  Masks are optional in the cancer centers. If you would like for your care team to wear a mask while they are taking care of you, please let them know. You may have one support person who is at least 38 years old accompany you for your appointments.

## 2022-01-08 ENCOUNTER — Inpatient Hospital Stay: Payer: BC Managed Care – PPO

## 2022-01-08 VITALS — BP 101/70 | HR 63 | Temp 97.9°F

## 2022-01-08 DIAGNOSIS — C50412 Malignant neoplasm of upper-outer quadrant of left female breast: Secondary | ICD-10-CM | POA: Diagnosis not present

## 2022-01-08 DIAGNOSIS — Z170 Estrogen receptor positive status [ER+]: Secondary | ICD-10-CM

## 2022-01-08 MED ORDER — PEGFILGRASTIM-CBQV 6 MG/0.6ML ~~LOC~~ SOSY
6.0000 mg | PREFILLED_SYRINGE | Freq: Once | SUBCUTANEOUS | Status: AC
  Administered 2022-01-08: 6 mg via SUBCUTANEOUS
  Filled 2022-01-08: qty 0.6

## 2022-01-13 ENCOUNTER — Encounter: Payer: Self-pay | Admitting: Hematology and Oncology

## 2022-01-22 ENCOUNTER — Other Ambulatory Visit: Payer: Self-pay | Admitting: Hematology and Oncology

## 2022-01-22 DIAGNOSIS — C50412 Malignant neoplasm of upper-outer quadrant of left female breast: Secondary | ICD-10-CM

## 2022-01-24 ENCOUNTER — Other Ambulatory Visit: Payer: Self-pay

## 2022-01-26 MED FILL — Dexamethasone Sodium Phosphate Inj 100 MG/10ML: INTRAMUSCULAR | Qty: 1 | Status: AC

## 2022-01-26 MED FILL — Fosaprepitant Dimeglumine For IV Infusion 150 MG (Base Eq): INTRAVENOUS | Qty: 5 | Status: AC

## 2022-01-27 ENCOUNTER — Inpatient Hospital Stay: Payer: BC Managed Care – PPO

## 2022-01-27 ENCOUNTER — Inpatient Hospital Stay: Payer: BC Managed Care – PPO | Attending: Hematology and Oncology | Admitting: Hematology and Oncology

## 2022-01-27 DIAGNOSIS — T451X5A Adverse effect of antineoplastic and immunosuppressive drugs, initial encounter: Secondary | ICD-10-CM | POA: Diagnosis not present

## 2022-01-27 DIAGNOSIS — Z853 Personal history of malignant neoplasm of breast: Secondary | ICD-10-CM | POA: Insufficient documentation

## 2022-01-27 DIAGNOSIS — C50412 Malignant neoplasm of upper-outer quadrant of left female breast: Secondary | ICD-10-CM

## 2022-01-27 DIAGNOSIS — R21 Rash and other nonspecific skin eruption: Secondary | ICD-10-CM | POA: Diagnosis not present

## 2022-01-27 DIAGNOSIS — Z170 Estrogen receptor positive status [ER+]: Secondary | ICD-10-CM | POA: Insufficient documentation

## 2022-01-27 DIAGNOSIS — R110 Nausea: Secondary | ICD-10-CM | POA: Insufficient documentation

## 2022-01-27 DIAGNOSIS — Z5111 Encounter for antineoplastic chemotherapy: Secondary | ICD-10-CM | POA: Diagnosis present

## 2022-01-27 DIAGNOSIS — Z79899 Other long term (current) drug therapy: Secondary | ICD-10-CM | POA: Diagnosis not present

## 2022-01-27 DIAGNOSIS — R197 Diarrhea, unspecified: Secondary | ICD-10-CM | POA: Diagnosis not present

## 2022-01-27 DIAGNOSIS — Z95828 Presence of other vascular implants and grafts: Secondary | ICD-10-CM

## 2022-01-27 LAB — CMP (CANCER CENTER ONLY)
ALT: 10 U/L (ref 0–44)
AST: 14 U/L — ABNORMAL LOW (ref 15–41)
Albumin: 4 g/dL (ref 3.5–5.0)
Alkaline Phosphatase: 49 U/L (ref 38–126)
Anion gap: 7 (ref 5–15)
BUN: 14 mg/dL (ref 6–20)
CO2: 27 mmol/L (ref 22–32)
Calcium: 9.3 mg/dL (ref 8.9–10.3)
Chloride: 105 mmol/L (ref 98–111)
Creatinine: 0.61 mg/dL (ref 0.44–1.00)
GFR, Estimated: >60 mL/min (ref >60)
Glucose, Bld: 109 mg/dL — ABNORMAL HIGH (ref 70–99)
Potassium: 4 mmol/L (ref 3.5–5.1)
Sodium: 139 mmol/L (ref 135–145)
Total Bilirubin: 0.4 mg/dL (ref 0.3–1.2)
Total Protein: 6.2 g/dL — ABNORMAL LOW (ref 6.5–8.1)

## 2022-01-27 LAB — CBC WITH DIFFERENTIAL (CANCER CENTER ONLY)
Abs Immature Granulocytes: 0.05 10*3/uL (ref 0.00–0.07)
Basophils Absolute: 0 10*3/uL (ref 0.0–0.1)
Basophils Relative: 0 %
Eosinophils Absolute: 0.1 10*3/uL (ref 0.0–0.5)
Eosinophils Relative: 1 %
HCT: 30.5 % — ABNORMAL LOW (ref 36.0–46.0)
Hemoglobin: 9.9 g/dL — ABNORMAL LOW (ref 12.0–15.0)
Immature Granulocytes: 1 %
Lymphocytes Relative: 7 %
Lymphs Abs: 0.7 10*3/uL (ref 0.7–4.0)
MCH: 27 pg (ref 26.0–34.0)
MCHC: 32.5 g/dL (ref 30.0–36.0)
MCV: 83.3 fL (ref 80.0–100.0)
Monocytes Absolute: 0.6 10*3/uL (ref 0.1–1.0)
Monocytes Relative: 6 %
Neutro Abs: 8.4 10*3/uL — ABNORMAL HIGH (ref 1.7–7.7)
Neutrophils Relative %: 85 %
Platelet Count: 248 10*3/uL (ref 150–400)
RBC: 3.66 MIL/uL — ABNORMAL LOW (ref 3.87–5.11)
RDW: 18.9 % — ABNORMAL HIGH (ref 11.5–15.5)
WBC Count: 9.8 10*3/uL (ref 4.0–10.5)
nRBC: 0 % (ref 0.0–0.2)

## 2022-01-27 MED ORDER — SODIUM CHLORIDE 0.9 % IV SOLN
10.0000 mg | Freq: Once | INTRAVENOUS | Status: AC
  Administered 2022-01-27: 10 mg via INTRAVENOUS
  Filled 2022-01-27: qty 10

## 2022-01-27 MED ORDER — SODIUM CHLORIDE 0.9 % IV SOLN: Freq: Once | INTRAVENOUS | Status: AC

## 2022-01-27 MED ORDER — SODIUM CHLORIDE 0.9 % IV SOLN
420.0000 mg | Freq: Once | INTRAVENOUS | Status: AC
  Administered 2022-01-27: 420 mg via INTRAVENOUS
  Filled 2022-01-27: qty 14

## 2022-01-27 MED ORDER — TRASTUZUMAB-DKST CHEMO 150 MG IV SOLR
6.0000 mg/kg | Freq: Once | INTRAVENOUS | Status: AC
  Administered 2022-01-27: 420 mg via INTRAVENOUS
  Filled 2022-01-27: qty 20

## 2022-01-27 MED ORDER — SODIUM CHLORIDE 0.9 % IV SOLN
150.0000 mg | Freq: Once | INTRAVENOUS | Status: AC
  Administered 2022-01-27: 150 mg via INTRAVENOUS
  Filled 2022-01-27: qty 150

## 2022-01-27 MED ORDER — SODIUM CHLORIDE 0.9% FLUSH
10.0000 mL | INTRAVENOUS | Status: AC | PRN
  Administered 2022-01-27: 10 mL

## 2022-01-27 MED ORDER — HEPARIN SOD (PORK) LOCK FLUSH 100 UNIT/ML IV SOLN
500.0000 [IU] | Freq: Once | INTRAVENOUS | Status: AC | PRN
  Administered 2022-01-27: 500 [IU]

## 2022-01-27 MED ORDER — SODIUM CHLORIDE 0.9 % IV SOLN
60.0000 mg/m2 | Freq: Once | INTRAVENOUS | Status: AC
  Administered 2022-01-27: 110 mg via INTRAVENOUS
  Filled 2022-01-27: qty 11

## 2022-01-27 MED ORDER — PALONOSETRON HCL INJECTION 0.25 MG/5ML
0.2500 mg | Freq: Once | INTRAVENOUS | Status: AC
  Administered 2022-01-27: 0.25 mg via INTRAVENOUS
  Filled 2022-01-27: qty 5

## 2022-01-27 MED ORDER — SODIUM CHLORIDE 0.9 % IV SOLN
650.0000 mg | Freq: Once | INTRAVENOUS | Status: AC
  Administered 2022-01-27: 650 mg via INTRAVENOUS
  Filled 2022-01-27: qty 65

## 2022-01-27 MED ORDER — ACETAMINOPHEN 325 MG PO TABS
650.0000 mg | ORAL_TABLET | Freq: Once | ORAL | Status: AC
  Administered 2022-01-27: 650 mg via ORAL
  Filled 2022-01-27: qty 2

## 2022-01-27 MED ORDER — DIPHENHYDRAMINE HCL 25 MG PO CAPS
50.0000 mg | ORAL_CAPSULE | Freq: Once | ORAL | Status: AC
  Administered 2022-01-27: 50 mg via ORAL
  Filled 2022-01-27: qty 2

## 2022-01-27 MED ORDER — SODIUM CHLORIDE 0.9% FLUSH
10.0000 mL | INTRAVENOUS | Status: DC | PRN
  Administered 2022-01-27: 10 mL

## 2022-01-27 NOTE — Progress Notes (Signed)
[name] PROGRESS NOTE  Patient Care Team: Sasser, Silvestre Moment, MD as PCP - General (Family Medicine) Erroll Luna, MD as Consulting Physician (General Surgery) Irene Limbo, MD as Consulting Physician (Plastic Surgery) Eppie Gibson, MD as Consulting Physician (Radiation Oncology) Rockwell Germany, RN as Oncology Nurse Navigator Mauro Kaufmann, RN as Oncology Nurse Navigator Azucena Fallen, MD as Consulting Physician (Obstetrics and Gynecology) Lavonna Monarch, MD (Inactive) as Consulting Physician (Dermatology) Azucena Fallen, MD as Consulting Physician (Obstetrics and Gynecology) Warren Danes, PA-C as Physician Assistant (Dermatology) Benay Pike, MD as Consulting Physician (Hematology and Oncology)  CHIEF COMPLAINTS/PURPOSE OF CONSULTATION:  Breast cancer follow up  SUMMARY OF ONCOLOGIC HISTORY: Oncology History  Malignant neoplasm of upper-outer quadrant of female breast ([place]) (Resolved)  10/20/2018 Initial Diagnosis   Malignant neoplasm of upper-outer quadrant of left breast in female, estrogen receptor positive ([place])   10/20/2018 Cancer Staging   Staging form: Breast, AJCC 8th Edition - Clinical stage from 10/20/2018: Stage 0 (cTis (DCIS), cN0, cM0, ER+, PR-) - Signed by Chauncey Cruel, MD on 02/13/2020 Stage prefix: Initial diagnosis   11/03/2018 Genetic Testing   Negative genetic testing:  No pathogenic variants detected on the [facility] Multi-Cancer panel, ordered by Dr. Benjie Karvonen at [facility]. The report date is 11/03/2018.  The Multi-Cancer Panel offered by [facility] includes sequencing and/or deletion duplication testing of the following 84 genes: AIP, ALK, APC, ATM, AXIN2,BAP1,  BARD1, BLM, BMPR1A, BRCA1, BRCA2, BRIP1, CASR, CDC73, CDH1, CDK4, CDKN1B, CDKN1C, CDKN2A (p14ARF), CDKN2A (p16INK4a), CEBPA, CHEK2, CTNNA1, DICER1, DIS3L2, EGFR (c.2369C>T, p.Thr790Met variant only), EPCAM (Deletion/duplication testing only), FH, FLCN,  GATA2, GPC3, GREM1 (Promoter region deletion/duplication testing only), HOXB13 (c.251G>A, p.Gly84Glu), HRAS, KIT, MAX, MEN1, MET, MITF (c.952G>A, p.Glu318Lys variant only), MLH1, MSH2, MSH3, MSH6, MUTYH, NBN, NF1, NF2, NTHL1, PALB2, PDGFRA, PHOX2B, PMS2, POLD1, POLE, POT1, PRKAR1A, PTCH1, PTEN, RAD50, RAD51C, RAD51D, RB1, RECQL4, RET, RUNX1, SDHAF2, SDHA (sequence changes only), SDHB, SDHC, SDHD, SMAD4, SMARCA4, SMARCB1, SMARCE1, STK11, SUFU, TERC, TERT, TMEM127, TP53, TSC1, TSC2, VHL, WRN and WT1.     01/17/2019 Cancer Staging   Staging form: Breast, AJCC 8th Edition - Pathologic stage from 01/17/2019: Stage IA (pT31m, pN0, cM0, G2, ER+, PR-, HER2-) - Signed by CGardenia Phlegm NP on 02/01/2019   Malignant neoplasm of upper-outer quadrant of left breast in female, estrogen receptor positive ([place]  10/21/2021 Breast [place]  Breast ultrasound of the palpable mass showed hypoechoic oval vascular mass with indistinct margins measuring 1.5 x 1 cm x 1.4 cm.  No abnormal lymph nodes found in the left axilla.  She had ultrasound-guided biopsy.  MRI scheduled for tomorrow.   10/29/2021 Pathology Results   Surgical pathology from the left breast needle core biopsy at 130 o'clock showed invasive ductal carcinoma, overall grade 2, prognostics ER 70% positive weak to moderate staining, PR 0% negative, Ki-67 of 80% and HER2 3+   11/18/2021 Initial Diagnosis   Malignant neoplasm of upper-outer quadrant of left breast in female, estrogen receptor positive ([place]   11/18/2021 Cancer Staging   Staging form: Breast, AJCC 8th Edition - Clinical: Stage IA (cT1c, cN0, cM0, G2, ER+, PR-, HER2+) - Signed by IBenay Pike MD on 11/18/2021 Histologic grading system: 3 grade system   11/26/2021 -  Chemotherapy   Patient is on Treatment Plan : BREAST  Docetaxel + Carboplatin + Trastuzumab + Pertuzumab  (TCHP) q21d       INTERIM HISTORY: Stephanie [name] is a 37y.o. female who returns  for a follow up for breast  cancer. She was last seen by Dr. Chryl Heck on 12/16/2021. In the interim, she completed Cycle 3 of TCHP.   Today, Stephanie [name] is accompanied by her husband for this visit.  She had several things written down in her notebook to discuss about.  She complains of nausea, significant without any vomiting.  She was trying to alternate Zofran on few days versus Compazine on other days.  She has noted about 3-4 bowel movements a day, had to take Imodium.  She has noticed some reflux and indigestion for which she had to use Pepcid.  She was trying to exercise but she has noticed that her heart rate gets up too quickly and she has some early muscle fatigue so she was not able to exercise as much.  She is taking doxycycline for skin rash.  She has also noticed some dyspareunia.  She does not feel the breast mass anymore and she is delighted by this.  Rest of the ROS was reviewed and negative.   MEDICAL HISTORY:  Past Medical History:  Diagnosis Date   Atypical mole 03/14/2019   mod-left side superior   BRCA gene mutation negative in female    Breast cancer [facility])    Family history of breast cancer    Family history of lung cancer    Family history of rectal cancer    Melanoma ([place]) 10/14/2007   level II- right arm- ([facility])   PONV (postoperative nausea and vomiting)     SURGICAL HISTORY: Past Surgical History:  Procedure Laterality Date   BREAST RECONSTRUCTION WITH PLACEMENT OF TISSUE EXPANDER AND ALLODERM Bilateral 01/17/2019   Procedure: BILATERAL BREAST RECONSTRUCTION WITH PLACEMENT OF TISSUE EXPANDER AND ALLODERM;  Surgeon: Irene Limbo, MD;  Location: [facility];  Service: Plastics;  Laterality: Bilateral;   IR IMAGING GUIDED PORT INSERTION  11/25/2021   IR RADIOLOGIST EVAL & MGMT  11/26/2021   LESION REMOVAL Left 06/02/2019   Procedure: MINOR EXICISION OF LESION,LAYERED CLOSURE 1 CM;  Surgeon: Irene Limbo, MD;  Location: [facility];  Service: Plastics;   Laterality: Left;   LIPOSUCTION WITH LIPOFILLING Bilateral 06/02/2019   Procedure: LIPOFILLING FROM ABDOMEN TO BILATERAL CHEST;  Surgeon: Irene Limbo, MD;  Location: [place] [facility];  Service: Plastics;  Laterality: Bilateral;   melanoma surgery Right 2009   upper right arm   NIPPLE SPARING MASTECTOMY WITH SENTINEL LYMPH NODE BIOPSY Bilateral 01/17/2019   Procedure: BILATERAL NIPPLE SPARING MASTECTOMIES WITH LEFT SENTINEL LYMPH NODE MAPPING;  Surgeon: Erroll Luna, MD;  Location: [facility];  Service: General;  Laterality: Bilateral;   REMOVAL OF BILATERAL TISSUE EXPANDERS WITH PLACEMENT OF BILATERAL BREAST IMPLANTS Bilateral 06/02/2019   Procedure: REMOVAL OF BILATERAL TISSUE EXPANDERS WITH PLACEMENT OF BILATERAL SILICONE BREAST IMPLANTS;  Surgeon: Irene Limbo, MD;  Location: [facility];  Service: Plastics;  Laterality: Bilateral;   WISDOM TOOTH EXTRACTION      SOCIAL HISTORY: Social History   Socioeconomic History   Marital status: Married    Spouse name: Not on file   Number of children: Not on file   Years of education: Not on file   Highest education level: Not on file  Occupational History   Not on file  Tobacco Use   Smoking status: Never   Smokeless tobacco: Never  Vaping Use   Vaping Use: Never used  Substance and Sexual Activity   Alcohol use: Yes    Comment: occasional  Drug use: Never   Sexual activity: Yes    Birth control/protection: Pill  Other Topics Concern   Not on file  Social History Narrative   Not on file   Social Determinants of Health   Financial Resource Strain: Not on file  Food Insecurity: Not on file  Transportation Needs: No Transportation Needs (11/09/2018)   PRAPARE - Transportation    Lack of Transportation (Medical): No    Lack of Transportation (Non-Medical): No  Physical Activity: Not on file  Stress: Not on file  Social Connections: Not on file  Intimate Partner Violence: Not At  Risk (11/09/2018)   Humiliation, Afraid, Rape, and Kick questionnaire    Fear of Current or Ex-Partner: No    Emotionally Abused: No    Physically Abused: No    Sexually Abused: No    FAMILY HISTORY: Family History  Problem Relation Age of Onset   Hypertension Mother    Heart disease Father    Breast cancer Maternal Aunt 60 - 69   Breast cancer Maternal Aunt 88 - 59   Lung cancer Paternal Aunt        diagnosed late 18s   Breast cancer Maternal Grandmother 60 - 69   Breast cancer Paternal Grandmother 60 - 72   Lung cancer Paternal Grandmother        thought to be breast cancer mets   Rectal cancer Paternal Grandmother        diagnosed 74s   Lung cancer Paternal Grandfather        diagnosed 11s    ALLERGIES:  is allergic to oxycodone.  MEDICATIONS:  Current Outpatient Medications  Medication Sig Dispense Refill   ALPRAZolam (XANAX) 0.5 MG tablet Take 1 tablet (0.5 mg total) by mouth 3 (three) times daily as needed for anxiety. 20 tablet 0   cetirizine (ZYRTEC) 10 MG tablet Take 10 mg by mouth daily.      dexamethasone (DECADRON) 4 MG tablet TAKE 2 TABS 2 TIMES DAILY STARTING DAY BEFORE CHEMO. THEN TAKE 2 TABS DAILY FOR2 DAYS STARTING DAY AFTER CHEMO. TAKE WITH FOOD. 30 tablet 1   doxycycline (VIBRA-TABS) 100 MG tablet Take 1 tablet (100 mg total) by mouth 2 (two) times daily. 30 tablet 2   lidocaine-prilocaine (EMLA) cream Apply to affected area once 30 g 3   meclizine (ANTIVERT) 25 MG tablet Take 25 mg by mouth 3 (three) times daily as needed (Vertigo/Motion Sickness).     Multiple Vitamins-Minerals (MULTIVITAMIN WITH MINERALS) tablet Take 1 tablet by mouth daily.     ondansetron (ZOFRAN) 8 MG tablet TAKE 1 TABLET BY MOUTH EVERY 8 HOURS AS NEEDED FOR NAUSEA & VOMITING START ON THE THIRD DAY AFTER CHEMO. 30 tablet 1   prochlorperazine (COMPAZINE) 10 MG tablet TAKE 1 TABLET BY MOUTH EVERY 6 HOURS AS NEEDED FOR NAUSEA & VOMITING 30 tablet 1   No current facility-administered  medications for this visit.   Facility-Administered Medications Ordered in Other Visits  Medication Dose Route Frequency Provider Last Rate Last Admin   CARBOplatin (PARAPLATIN) 650 mg in sodium chloride 0.9 % 250 mL chemo infusion  650 mg Intravenous Once Tavyn Kurka, MD       DOCEtaxel (TAXOTERE) 110 mg in sodium chloride 0.9 % 250 mL chemo infusion  60 mg/m2 (Treatment Plan Recorded) Intravenous Once Krissy Orebaugh, MD       heparin lock flush 100 unit/mL  500 Units Intracatheter Once PRN Benay Pike, MD       pertuzumab ([place]) 420  mg in sodium chloride 0.9 % 250 mL chemo infusion  420 mg Intravenous Once Paw Karstens, MD       sodium chloride flush (NS) 0.9 % injection 10 mL  10 mL Intracatheter PRN Kylie Gros, MD       trastuzumab-dkst (OGIVRI) 420 mg in sodium chloride 0.9 % 250 mL chemo infusion  6 mg/kg (Treatment Plan Recorded) Intravenous Once Gaynelle Pastrana, [name], MD        PHYSICAL EXAMINATION: ECOG PERFORMANCE STATUS: 1 - Symptomatic but completely ambulatory  Vitals:   01/27/22 1150  BP: 117/76  Pulse: 77  Resp: 16  Temp: 97.9 F (36.6 C)  SpO2: 100%    Filed Weights   01/27/22 1150  Weight: 153 lb 14.4 oz (69.8 kg)     [ Day 1 ] 01/06/22  Height '5\' 6"'$  (1.676 m)  Weight 152 lb 8 oz (69.2 kg)  BSA (Calculated - sq m) 1.79 sq meters  Temp 97.7 F (36.5 C)  Temp src Oral  Pulse 68  Resp 16  BP 109/71    Constitutional: Oriented to person, place, and time and well-developed, well-nourished, and in no distress.  HENT:  Head: Normocephalic and atraumatic.  Eyes: Conjunctivae are normal. Right eye exhibits no discharge. Left eye exhibits no discharge. No scleral icterus.    Cardiovascular: Normal rate, regular rhythm, normal heart sounds  Pulmonary/Chest: Effort normal and breath sounds normal. No respiratory distress. No wheezes. No rales.  Musculoskeletal: Normal range of motion. Exhibits no edema.  Lymphadenopathy: No cervical adenopathy.   Neurological: Alert and oriented to person, place, and time. Exhibits normal muscle tone.  Skin: Skin is warm and dry. No rash noted. Not diaphoretic. No erythema. No pallor.  Psychiatric: Mood, memory and judgment normal.  Breast: No palpable breast masses.  LABORATORY DATA:  I have reviewed the data as listed Lab Results  Component Value Date   WBC 9.8 01/27/2022   HGB 9.9 (L) 01/27/2022   HCT 30.5 (L) 01/27/2022   MCV 83.3 01/27/2022   PLT 248 01/27/2022   Lab Results  Component Value Date   NA 139 01/27/2022   K 4.0 01/27/2022   CL 105 01/27/2022   CO2 27 01/27/2022    RADIOGRAPHIC STUDIES: I have personally reviewed the radiological reports and agreed with the findings in the report.  ASSESSMENT AND PLAN: Nhi Tajae Maiolo is a 37 y.o. female who presents to the clinic for follow up for left breast cancer.   #Malignant neoplasm of upper-outer quadrant of left breast: -Breast US from 10/21/2021 showed showed hypoechoic oval vascular mass with indistinct margins measuring 1.5 x 1 cm x 1.4 cm.  -Grade 2, ER +70% weak to moderate staining, PR 0% negative, HER2 3+ by IHC, Ki-67 of 80%  -Baseline breast MRI from 11/19/2021 showed tumor size measuring up to 3.6 cm. No regional adenopathy. CT CAP and bone scan from 12/03/2021 showed no evidence of metastatic disease. -Recommended neoadjuvant chemotherapy with TCHP, started on 11/26/2021 -Due for Cycle 4 Day 1 of TCHP.  -- Given multiple adverse effects which have been bothersome since her last visit, we have discussed about dose reducing the docetaxel to 60 mg/m and carboplatin to AUC of 5.  #Diarrhea: --Secondary to chemotherapy --Encouraged to use imodium with each episode and contact clinic if she has more than 4 episodes per day.   #Nausea without vomiting:  --Secondary to chemotherapy --Encouraged to take prescribed antiemetics as directed.   #Acneiform rash: --Involving scalp, chest and neck. 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  -Underwent bilateral nipple sparing mastectomies with left sentinel lymph node sampling .  -Pathology: (a) on the right, no evidence of malignancy (b) on the left, pT62m pN0, stage IA invasive carcinoma, grade 2, with negative margins          (i) four left axillary sentinel nodes removed          (ii) ductal carcinoma in situ, grade 3, also present, with negative margins          (iii) insufficient invasive tissue for prognostic panel evaluation  -Patient opted against anti-estrogen therapy which was optional.  -Adjuvant radiation was not indicated.   # Lower extremity ankle swelling, mild today.  This could repeat related to taxane.  Will continue to monitor. All questions were answered. The patient knows to call the clinic with any problems, questions or concerns.  I have spent a total of 30 minutes minutes of face-to-face and non-face-to-face time, preparing to see the patient, performing a medically appropriate examination, counseling and educating the patient, documenting clinical information in the electronic health record,and care coordination.   PBenay PikeMD  Phone: [phone]

## 2022-01-27 NOTE — Patient Instructions (Signed)
[facility] ONCOLOGY  Discharge Instructions: Thank you for choosing [facility] to provide your oncology and hematology care.   If you have a lab appointment with the [facility], please go directly to the [facility] and check in at the registration area.   Wear comfortable clothing and clothing appropriate for easy access to any Portacath or PICC line.   We strive to give you quality time with your provider. You may need to reschedule your appointment if you arrive late (15 or more minutes).  Arriving late affects you and other patients whose appointments are after yours.  Also, if you miss three or more appointments without notifying the office, you may be dismissed from the clinic at the provider's discretion.      For prescription refill requests, have your pharmacy contact our office and allow 72 hours for refills to be completed.    Today you received the following chemotherapy and/or immunotherapy agents: trastuzumab-dkst, pertuzumab, docetaxel and carboplatin      To help prevent nausea and vomiting after your treatment, we encourage you to take your nausea medication as directed.  BELOW ARE SYMPTOMS THAT SHOULD BE REPORTED IMMEDIATELY: *FEVER GREATER THAN 100.4 F (38 C) OR HIGHER *CHILLS OR SWEATING *NAUSEA AND VOMITING THAT IS NOT CONTROLLED WITH YOUR NAUSEA MEDICATION *UNUSUAL SHORTNESS OF BREATH *UNUSUAL BRUISING OR BLEEDING *URINARY PROBLEMS (pain or burning when urinating, or frequent urination) *BOWEL PROBLEMS (unusual diarrhea, constipation, pain near the anus) TENDERNESS IN MOUTH AND THROAT WITH OR WITHOUT PRESENCE OF ULCERS (sore throat, sores in mouth, or a toothache) UNUSUAL RASH, SWELLING OR PAIN  UNUSUAL VAGINAL DISCHARGE OR ITCHING   Items with * indicate a potential emergency and should be followed up as soon as possible or go to the Emergency Department if any problems should occur.  Please show the CHEMOTHERAPY ALERT  CARD or IMMUNOTHERAPY ALERT CARD at check-in to the Emergency Department and triage nurse.  Should you have questions after your visit or need to cancel or reschedule your appointment, please contact [facility]  Dept: [phone]  and follow the prompts.  Office hours are 8:00 a.m. to 4:30 p.m. Monday - Friday. Please note that voicemails left after 4:00 p.m. may not be returned until the following business day.  We are closed weekends and major holidays. You have access to a nurse at all times for urgent questions. Please call the main number to the clinic Dept: [phone] and follow the prompts.   For any non-urgent questions, you may also contact your provider using MyChart. We now offer e-Visits for anyone 80 and older to request care online for non-urgent symptoms. For details visit [email or web address].   Also download the MyChart app! Go to the app store, search "MyChart", open the app, select [facility], and log in with your MyChart username and password.

## 2022-01-29 ENCOUNTER — Inpatient Hospital Stay: Payer: BC Managed Care – PPO

## 2022-01-29 VITALS — BP 120/70 | HR 66 | Temp 98.4°F | Resp 16

## 2022-01-29 DIAGNOSIS — C50412 Malignant neoplasm of upper-outer quadrant of left female breast: Secondary | ICD-10-CM | POA: Diagnosis not present

## 2022-01-29 MED ORDER — PEGFILGRASTIM-CBQV 6 MG/0.6ML ~~LOC~~ SOSY
6.0000 mg | PREFILLED_SYRINGE | Freq: Once | SUBCUTANEOUS | Status: AC
  Administered 2022-01-29: 6 mg via SUBCUTANEOUS
  Filled 2022-01-29: qty 0.6

## 2022-01-29 NOTE — Patient Instructions (Signed)

## 2022-02-03 ENCOUNTER — Encounter: Payer: Self-pay | Admitting: *Deleted

## 2022-02-03 ENCOUNTER — Telehealth: Payer: Self-pay | Admitting: Hematology and Oncology

## 2022-02-03 NOTE — Telephone Encounter (Signed)
Contacted patient to scheduled appointments. Left message with appointment details and a call back number if patient had any questions or could not accommodate the time we provided.

## 2022-02-04 ENCOUNTER — Other Ambulatory Visit: Payer: Self-pay

## 2022-02-17 ENCOUNTER — Inpatient Hospital Stay (HOSPITAL_BASED_OUTPATIENT_CLINIC_OR_DEPARTMENT_OTHER): Payer: BC Managed Care – PPO | Admitting: Hematology and Oncology

## 2022-02-17 ENCOUNTER — Inpatient Hospital Stay: Payer: BC Managed Care – PPO | Attending: Hematology and Oncology

## 2022-02-17 ENCOUNTER — Inpatient Hospital Stay: Payer: BC Managed Care – PPO

## 2022-02-17 ENCOUNTER — Encounter: Payer: Self-pay | Admitting: Hematology and Oncology

## 2022-02-17 ENCOUNTER — Other Ambulatory Visit: Payer: Self-pay

## 2022-02-17 VITALS — BP 99/59 | HR 67 | Temp 98.0°F | Resp 18

## 2022-02-17 VITALS — BP 117/71 | HR 83 | Temp 97.7°F | Resp 16 | Ht 66.0 in | Wt 154.7 lb

## 2022-02-17 DIAGNOSIS — R21 Rash and other nonspecific skin eruption: Secondary | ICD-10-CM | POA: Insufficient documentation

## 2022-02-17 DIAGNOSIS — Z170 Estrogen receptor positive status [ER+]: Secondary | ICD-10-CM

## 2022-02-17 DIAGNOSIS — Z5111 Encounter for antineoplastic chemotherapy: Secondary | ICD-10-CM | POA: Insufficient documentation

## 2022-02-17 DIAGNOSIS — C50412 Malignant neoplasm of upper-outer quadrant of left female breast: Secondary | ICD-10-CM

## 2022-02-17 DIAGNOSIS — Z79899 Other long term (current) drug therapy: Secondary | ICD-10-CM | POA: Diagnosis not present

## 2022-02-17 DIAGNOSIS — R110 Nausea: Secondary | ICD-10-CM | POA: Insufficient documentation

## 2022-02-17 DIAGNOSIS — R197 Diarrhea, unspecified: Secondary | ICD-10-CM | POA: Diagnosis not present

## 2022-02-17 DIAGNOSIS — Z95828 Presence of other vascular implants and grafts: Secondary | ICD-10-CM

## 2022-02-17 LAB — CBC WITH DIFFERENTIAL/PLATELET
Abs Immature Granulocytes: 0.02 10*3/uL (ref 0.00–0.07)
Basophils Absolute: 0 10*3/uL (ref 0.0–0.1)
Basophils Relative: 0 %
Eosinophils Absolute: 0 10*3/uL (ref 0.0–0.5)
Eosinophils Relative: 0 %
HCT: 31.3 % — ABNORMAL LOW (ref 36.0–46.0)
Hemoglobin: 10.3 g/dL — ABNORMAL LOW (ref 12.0–15.0)
Immature Granulocytes: 0 %
Lymphocytes Relative: 5 %
Lymphs Abs: 0.4 10*3/uL — ABNORMAL LOW (ref 0.7–4.0)
MCH: 28.4 pg (ref 26.0–34.0)
MCHC: 32.9 g/dL (ref 30.0–36.0)
MCV: 86.2 fL (ref 80.0–100.0)
Monocytes Absolute: 0 10*3/uL — ABNORMAL LOW (ref 0.1–1.0)
Monocytes Relative: 1 %
Neutro Abs: 7.2 10*3/uL (ref 1.7–7.7)
Neutrophils Relative %: 94 %
Platelets: 213 10*3/uL (ref 150–400)
RBC: 3.63 MIL/uL — ABNORMAL LOW (ref 3.87–5.11)
RDW: 20.7 % — ABNORMAL HIGH (ref 11.5–15.5)
WBC: 7.7 10*3/uL (ref 4.0–10.5)
nRBC: 0 % (ref 0.0–0.2)

## 2022-02-17 LAB — COMPREHENSIVE METABOLIC PANEL
ALT: 19 U/L (ref 0–44)
AST: 23 U/L (ref 15–41)
Albumin: 3.9 g/dL (ref 3.5–5.0)
Alkaline Phosphatase: 49 U/L (ref 38–126)
Anion gap: 8 (ref 5–15)
BUN: 16 mg/dL (ref 6–20)
CO2: 26 mmol/L (ref 22–32)
Calcium: 9.4 mg/dL (ref 8.9–10.3)
Chloride: 106 mmol/L (ref 98–111)
Creatinine, Ser: 0.63 mg/dL (ref 0.44–1.00)
GFR, Estimated: >60 mL/min (ref >60)
Glucose, Bld: 141 mg/dL — ABNORMAL HIGH (ref 70–99)
Potassium: 3.9 mmol/L (ref 3.5–5.1)
Sodium: 140 mmol/L (ref 135–145)
Total Bilirubin: 0.5 mg/dL (ref 0.3–1.2)
Total Protein: 5.9 g/dL — ABNORMAL LOW (ref 6.5–8.1)

## 2022-02-17 LAB — CK TOTAL AND CKMB (NOT AT ARMC)
CK, MB: 0.9 ng/mL (ref 0.5–5.0)
Relative Index: INVALID (ref 0.0–2.5)
Total CK: 86 U/L (ref 38–234)

## 2022-02-17 MED ORDER — SODIUM CHLORIDE 0.9 % IV SOLN
655.0000 mg | Freq: Once | INTRAVENOUS | Status: AC
  Administered 2022-02-17: 650 mg via INTRAVENOUS
  Filled 2022-02-17: qty 65

## 2022-02-17 MED ORDER — TRASTUZUMAB-DKST CHEMO 150 MG IV SOLR
6.0000 mg/kg | Freq: Once | INTRAVENOUS | Status: AC
  Administered 2022-02-17: 420 mg via INTRAVENOUS
  Filled 2022-02-17: qty 20

## 2022-02-17 MED ORDER — SODIUM CHLORIDE 0.9 % IV SOLN: Freq: Once | INTRAVENOUS | Status: AC

## 2022-02-17 MED ORDER — SODIUM CHLORIDE 0.9 % IV SOLN
10.0000 mg | Freq: Once | INTRAVENOUS | Status: AC
  Administered 2022-02-17: 10 mg via INTRAVENOUS
  Filled 2022-02-17: qty 10

## 2022-02-17 MED ORDER — ACETAMINOPHEN 325 MG PO TABS
650.0000 mg | ORAL_TABLET | Freq: Once | ORAL | Status: AC
  Administered 2022-02-17: 650 mg via ORAL
  Filled 2022-02-17: qty 2

## 2022-02-17 MED ORDER — PALONOSETRON HCL INJECTION 0.25 MG/5ML
0.2500 mg | Freq: Once | INTRAVENOUS | Status: AC
  Administered 2022-02-17: 0.25 mg via INTRAVENOUS
  Filled 2022-02-17: qty 5

## 2022-02-17 MED ORDER — SODIUM CHLORIDE 0.9% FLUSH
10.0000 mL | INTRAVENOUS | Status: AC | PRN
  Administered 2022-02-17: 10 mL

## 2022-02-17 MED ORDER — SODIUM CHLORIDE 0.9 % IV SOLN
420.0000 mg | Freq: Once | INTRAVENOUS | Status: AC
  Administered 2022-02-17: 420 mg via INTRAVENOUS
  Filled 2022-02-17: qty 14

## 2022-02-17 MED ORDER — SODIUM CHLORIDE 0.9 % IV SOLN
150.0000 mg | Freq: Once | INTRAVENOUS | Status: AC
  Administered 2022-02-17: 150 mg via INTRAVENOUS
  Filled 2022-02-17: qty 150

## 2022-02-17 MED ORDER — SODIUM CHLORIDE 0.9 % IV SOLN
60.0000 mg/m2 | Freq: Once | INTRAVENOUS | Status: AC
  Administered 2022-02-17: 109 mg via INTRAVENOUS
  Filled 2022-02-17: qty 10.9

## 2022-02-17 MED ORDER — DIPHENHYDRAMINE HCL 25 MG PO CAPS
50.0000 mg | ORAL_CAPSULE | Freq: Once | ORAL | Status: AC
  Administered 2022-02-17: 50 mg via ORAL
  Filled 2022-02-17: qty 2

## 2022-02-17 NOTE — Progress Notes (Signed)
[name] PROGRESS NOTE  Patient Care Team: Sasser, Silvestre Moment, MD as PCP - General (Family Medicine) Erroll Luna, MD as Consulting Physician (General Surgery) Irene Limbo, MD as Consulting Physician (Plastic Surgery) Eppie Gibson, MD as Consulting Physician (Radiation Oncology) Rockwell Germany, RN as Oncology Nurse Navigator Mauro Kaufmann, RN as Oncology Nurse Navigator Azucena Fallen, MD as Consulting Physician (Obstetrics and Gynecology) Lavonna Monarch, MD (Inactive) as Consulting Physician (Dermatology) Azucena Fallen, MD as Consulting Physician (Obstetrics and Gynecology) Warren Danes, PA-C as Physician Assistant (Dermatology) Benay Pike, MD as Consulting Physician (Hematology and Oncology)  CHIEF COMPLAINTS/PURPOSE OF CONSULTATION:  Breast cancer follow up  SUMMARY OF ONCOLOGIC HISTORY: Oncology History  Malignant neoplasm of upper-outer quadrant of female breast ([place]) (Resolved)  10/20/2018 Initial Diagnosis   Malignant neoplasm of upper-outer quadrant of left breast in female, estrogen receptor positive ([place])   10/20/2018 Cancer Staging   Staging form: Breast, AJCC 8th Edition - Clinical stage from 10/20/2018: Stage 0 (cTis (DCIS), cN0, cM0, ER+, PR-) - Signed by Chauncey Cruel, MD on 02/13/2020 Stage prefix: Initial diagnosis   11/03/2018 Genetic Testing   Negative genetic testing:  No pathogenic variants detected on the [facility] Multi-Cancer panel, ordered by Dr. Benjie Karvonen at [facility]. The report date is 11/03/2018.  The Multi-Cancer Panel offered by [facility] includes sequencing and/or deletion duplication testing of the following 84 genes: AIP, ALK, APC, ATM, AXIN2,BAP1,  BARD1, BLM, BMPR1A, BRCA1, BRCA2, BRIP1, CASR, CDC73, CDH1, CDK4, CDKN1B, CDKN1C, CDKN2A (p14ARF), CDKN2A (p16INK4a), CEBPA, CHEK2, CTNNA1, DICER1, DIS3L2, EGFR (c.2369C>T, p.Thr790Met variant only), EPCAM (Deletion/duplication testing only), FH, FLCN,  GATA2, GPC3, GREM1 (Promoter region deletion/duplication testing only), HOXB13 (c.251G>A, p.Gly84Glu), HRAS, KIT, MAX, MEN1, MET, MITF (c.952G>A, p.Glu318Lys variant only), MLH1, MSH2, MSH3, MSH6, MUTYH, NBN, NF1, NF2, NTHL1, PALB2, PDGFRA, PHOX2B, PMS2, POLD1, POLE, POT1, PRKAR1A, PTCH1, PTEN, RAD50, RAD51C, RAD51D, RB1, RECQL4, RET, RUNX1, SDHAF2, SDHA (sequence changes only), SDHB, SDHC, SDHD, SMAD4, SMARCA4, SMARCB1, SMARCE1, STK11, SUFU, TERC, TERT, TMEM127, TP53, TSC1, TSC2, VHL, WRN and WT1.     01/17/2019 Cancer Staging   Staging form: Breast, AJCC 8th Edition - Pathologic stage from 01/17/2019: Stage IA (pT31m, pN0, cM0, G2, ER+, PR-, HER2-) - Signed by CGardenia Phlegm NP on 02/01/2019   Malignant neoplasm of upper-outer quadrant of left breast in female, estrogen receptor positive ([place]  10/21/2021 Breast [place]  Breast ultrasound of the palpable mass showed hypoechoic oval vascular mass with indistinct margins measuring 1.5 x 1 cm x 1.4 cm.  No abnormal lymph nodes found in the left axilla.  She had ultrasound-guided biopsy.  MRI scheduled for tomorrow.   10/29/2021 Pathology Results   Surgical pathology from the left breast needle core biopsy at 130 o'clock showed invasive ductal carcinoma, overall grade 2, prognostics ER 70% positive weak to moderate staining, PR 0% negative, Ki-67 of 80% and HER2 3+   11/18/2021 Initial Diagnosis   Malignant neoplasm of upper-outer quadrant of left breast in female, estrogen receptor positive ([place]   11/18/2021 Cancer Staging   Staging form: Breast, AJCC 8th Edition - Clinical: Stage IA (cT1c, cN0, cM0, G2, ER+, PR-, HER2+) - Signed by IBenay Pike MD on 11/18/2021 Histologic grading system: 3 grade system   11/26/2021 -  Chemotherapy   Patient is on Treatment Plan : BREAST  Docetaxel + Carboplatin + Trastuzumab + Pertuzumab  (TCHP) q21d       INTERIM HISTORY: Jeana Renee Stapel is a 37y.o. female who returns  for a follow up for breast  cancer. She was last seen by Dr. Chryl Heck on 12/16/2021. In the interim, she completed Cycle 4 of TCHP.   Today, Mrs. Frein is accompanied by her husband for this visit.  She still complains of some muscle fatigue, mild ankle swelling and very rare neuropathy. She is obviously frustrated that she was so healthy prior to this and she now can't exercise the way she wanted to. Rest of the ROS was reviewed and negative.   MEDICAL HISTORY:  Past Medical History:  Diagnosis Date   Atypical mole 03/14/2019   mod-left side superior   BRCA gene mutation negative in female    Breast cancer [facility])    Family history of breast cancer    Family history of lung cancer    Family history of rectal cancer    Melanoma ([place]) 10/14/2007   level II- right arm- ([facility])   PONV (postoperative nausea and vomiting)     SURGICAL HISTORY: Past Surgical History:  Procedure Laterality Date   BREAST RECONSTRUCTION WITH PLACEMENT OF TISSUE EXPANDER AND ALLODERM Bilateral 01/17/2019   Procedure: BILATERAL BREAST RECONSTRUCTION WITH PLACEMENT OF TISSUE EXPANDER AND ALLODERM;  Surgeon: Irene Limbo, MD;  Location: [facility];  Service: Plastics;  Laterality: Bilateral;   IR IMAGING GUIDED PORT INSERTION  11/25/2021   IR RADIOLOGIST EVAL & MGMT  11/26/2021   LESION REMOVAL Left 06/02/2019   Procedure: MINOR EXICISION OF LESION,LAYERED CLOSURE 1 CM;  Surgeon: Irene Limbo, MD;  Location: [facility];  Service: Plastics;  Laterality: Left;   LIPOSUCTION WITH LIPOFILLING Bilateral 06/02/2019   Procedure: LIPOFILLING FROM ABDOMEN TO BILATERAL CHEST;  Surgeon: Irene Limbo, MD;  Location: [facility];  Service: Plastics;  Laterality: Bilateral;   melanoma surgery Right 2009   upper right arm   NIPPLE SPARING MASTECTOMY WITH SENTINEL LYMPH NODE BIOPSY Bilateral 01/17/2019   Procedure: BILATERAL NIPPLE SPARING MASTECTOMIES WITH LEFT SENTINEL LYMPH NODE MAPPING;  Surgeon: Erroll Luna, MD;  Location: [facility];  Service: General;  Laterality: Bilateral;   REMOVAL OF BILATERAL TISSUE EXPANDERS WITH PLACEMENT OF BILATERAL BREAST IMPLANTS Bilateral 06/02/2019   Procedure: REMOVAL OF BILATERAL TISSUE EXPANDERS WITH PLACEMENT OF BILATERAL SILICONE BREAST IMPLANTS;  Surgeon: Irene Limbo, MD;  Location: [facility];  Service: Plastics;  Laterality: Bilateral;   WISDOM TOOTH EXTRACTION      SOCIAL HISTORY: Social History   Socioeconomic History   Marital status: Married    Spouse name: Not on file   Number of children: Not on file   Years of education: Not on file   Highest education level: Not on file  Occupational History   Not on file  Tobacco Use   Smoking status: Never   Smokeless tobacco: Never  Vaping Use   Vaping Use: Never used  Substance and Sexual Activity   Alcohol use: Yes    Comment: occasional   Drug use: Never   Sexual activity: Yes    Birth control/protection: Pill  Other Topics Concern   Not on file  Social History Narrative   Not on file   Social Determinants of Health   Financial Resource Strain: Not on file  Food Insecurity: Not on file  Transportation Needs: No Transportation Needs (11/09/2018)   PRAPARE - Transportation    Lack of Transportation (Medical): No    Lack of Transportation (Non-Medical): No  Physical Activity: Not on file  Stress: Not on file  Social Connections:  Not on file  Intimate Partner Violence: Not At Risk (11/09/2018)   Humiliation, Afraid, Rape, and Kick questionnaire    Fear of Current or Ex-Partner: No    Emotionally Abused: No    Physically Abused: No    Sexually Abused: No    FAMILY HISTORY: Family History  Problem Relation Age of Onset   Hypertension Mother    Heart disease Father    Breast cancer Maternal Aunt 60 - 69   Breast cancer Maternal Aunt 43 - 59   Lung cancer Paternal Aunt        diagnosed late 60s   Breast cancer Maternal Grandmother 60 - 69    Breast cancer Paternal Grandmother 28 - 61   Lung cancer Paternal Grandmother        thought to be breast cancer mets   Rectal cancer Paternal Grandmother        diagnosed 70s   Lung cancer Paternal Grandfather        diagnosed 64s    ALLERGIES:  is allergic to oxycodone.  MEDICATIONS:  Current Outpatient Medications  Medication Sig Dispense Refill   ALPRAZolam (XANAX) 0.5 MG tablet Take 1 tablet (0.5 mg total) by mouth 3 (three) times daily as needed for anxiety. 20 tablet 0   cetirizine (ZYRTEC) 10 MG tablet Take 10 mg by mouth daily.      dexamethasone (DECADRON) 4 MG tablet TAKE 2 TABS 2 TIMES DAILY STARTING DAY BEFORE CHEMO. THEN TAKE 2 TABS DAILY FOR2 DAYS STARTING DAY AFTER CHEMO. TAKE WITH FOOD. 30 tablet 1   doxycycline (VIBRA-TABS) 100 MG tablet Take 1 tablet (100 mg total) by mouth 2 (two) times daily. 30 tablet 2   lidocaine-prilocaine (EMLA) cream Apply to affected area once 30 g 3   meclizine (ANTIVERT) 25 MG tablet Take 25 mg by mouth 3 (three) times daily as needed (Vertigo/Motion Sickness).     Multiple Vitamins-Minerals (MULTIVITAMIN WITH MINERALS) tablet Take 1 tablet by mouth daily.     ondansetron (ZOFRAN) 8 MG tablet TAKE 1 TABLET BY MOUTH EVERY 8 HOURS AS NEEDED FOR NAUSEA & VOMITING START ON THE THIRD DAY AFTER CHEMO. 30 tablet 1   prochlorperazine (COMPAZINE) 10 MG tablet TAKE 1 TABLET BY MOUTH EVERY 6 HOURS AS NEEDED FOR NAUSEA & VOMITING 30 tablet 1   No current facility-administered medications for this visit.    PHYSICAL EXAMINATION: ECOG PERFORMANCE STATUS: 1 - Symptomatic but completely ambulatory  Vitals:   02/17/22 0858  BP: 117/71  Pulse: 83  Resp: 16  Temp: 97.7 F (36.5 C)  SpO2: 100%    Filed Weights   02/17/22 0858  Weight: 154 lb 11.2 oz (70.2 kg)     [ Day 1 ] 01/06/22  Height '5\' 6"'$  (1.676 m)  Weight 152 lb 8 oz (69.2 kg)  BSA (Calculated - sq m) 1.79 sq meters  Temp 97.7 F (36.5 C)  Temp src Oral  Pulse 68  Resp 16   BP 109/71    Constitutional: Oriented to person, place, and time and well-developed, well-nourished, and in no distress.  HENT:  Head: Normocephalic and atraumatic.  Eyes: Conjunctivae are normal. Right eye exhibits no discharge. Left eye exhibits no discharge. No scleral icterus.    Cardiovascular: Normal rate, regular rhythm, normal heart sounds  Pulmonary/Chest: Effort normal and breath sounds normal. No respiratory distress. No wheezes. No rales.  Musculoskeletal: Normal range of motion. Exhibits no edema.  Lymphadenopathy: No cervical adenopathy.  Neurological: Alert and oriented to  person, place, and time. Exhibits normal muscle tone.  Skin: Skin is warm and dry. No rash noted. Not diaphoretic. No erythema. No pallor.  Psychiatric: Mood, memory and judgment normal.  Breast: No palpable breast masses.  LABORATORY DATA:  I have reviewed the data as listed Lab Results  Component Value Date   WBC 7.7 02/17/2022   HGB 10.3 (L) 02/17/2022   HCT 31.3 (L) 02/17/2022   MCV 86.2 02/17/2022   PLT 213 02/17/2022   Lab Results  Component Value Date   NA 139 01/27/2022   K 4.0 01/27/2022   CL 105 01/27/2022   CO2 27 01/27/2022    RADIOGRAPHIC STUDIES: I have personally reviewed the radiological reports and agreed with the findings in the report.  ASSESSMENT AND PLAN: Athaliah Oleta Gunnoe is a 37 y.o. female who presents to the clinic for follow up for left breast cancer.   #Malignant neoplasm of upper-outer quadrant of left breast: -Breast US from 10/21/2021 showed showed hypoechoic oval vascular mass with indistinct margins measuring 1.5 x 1 cm x 1.4 cm.  -Grade 2, ER +70% weak to moderate staining, PR 0% negative, HER2 3+ by IHC, Ki-67 of 80%  -Baseline breast MRI from 11/19/2021 showed tumor size measuring up to 3.6 cm. No regional adenopathy. CT CAP and bone scan from 12/03/2021 showed no evidence of metastatic disease. -Recommended neoadjuvant chemotherapy with TCHP, started on  11/26/2021 -Due for Cycle 4 Day 1 of TCHP.  -- Given multiple adverse effects which have been bothersome since her last visit, we have dose reduced the docetaxel to 60 mg/m and carboplatin to AUC of 5. --NO new dose limiting toxicity, ok to proceed with treatment as scheduled.  #Diarrhea: --Grade 1 --Secondary to chemotherapy --Encouraged to use imodium with each episode and contact clinic if she has more than 4 episodes per day.   #Nausea without vomiting:  --Grade 1 --Secondary to chemotherapy --Encouraged to take prescribed antiemetics as directed.   #Acneiform rash: --Involving scalp, chest and neck. --Likely secondary to chemotherapy --She uses doxycyline as needed.  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  -Underwent bilateral nipple sparing mastectomies with left sentinel lymph node sampling .  -Pathology: (a) on the right, no evidence of malignancy (b) on the left, pT92m pN0, stage IA invasive carcinoma, grade 2, with negative margins          (i) four left axillary sentinel nodes removed          (ii) ductal carcinoma in situ, grade 3, also present, with negative margins          (iii) insufficient invasive tissue for prognostic panel evaluation  -Patient opted against anti-estrogen therapy which was optional.  -Adjuvant radiation was not indicated.   # Lower extremity ankle swelling, mild today.  This could repeat related to taxane.  Will continue to monitor.  All questions were answered. The patient knows to call the clinic with any problems, questions or concerns.  I have spent a total of 30 minutes minutes of face-to-face and non-face-to-face time, preparing to see the patient, performing a medically appropriate examination, counseling and educating the patient, documenting clinical information in the electronic health record,and care coordination.   PBenay PikeMD  Phone: [phone]

## 2022-02-17 NOTE — Patient Instructions (Signed)
[facility]  Discharge Instructions: Thank you for choosing [facility] to provide your oncology and hematology care.   If you have a lab appointment with the [facility], please go directly to the [facility] and check in at the registration area.   Wear comfortable clothing and clothing appropriate for easy access to any Portacath or PICC line.   We strive to give you quality time with your provider. You may need to reschedule your appointment if you arrive late (15 or more minutes).  Arriving late affects you and other patients whose appointments are after yours.  Also, if you miss three or more appointments without notifying the office, you may be dismissed from the clinic at the provider's discretion.      For prescription refill requests, have your pharmacy contact our office and allow 72 hours for refills to be completed.    Today you received the following chemotherapy and/or immunotherapy agents: Trastuzumab-dkst, Pertuzumab, Docetaxel and Carboplatin      To help prevent nausea and vomiting after your treatment, we encourage you to take your nausea medication as directed.  BELOW ARE SYMPTOMS THAT SHOULD BE REPORTED IMMEDIATELY: *FEVER GREATER THAN 100.4 F (38 C) OR HIGHER *CHILLS OR SWEATING *NAUSEA AND VOMITING THAT IS NOT CONTROLLED WITH YOUR NAUSEA MEDICATION *UNUSUAL SHORTNESS OF BREATH *UNUSUAL BRUISING OR BLEEDING *URINARY PROBLEMS (pain or burning when urinating, or frequent urination) *BOWEL PROBLEMS (unusual diarrhea, constipation, pain near the anus) TENDERNESS IN MOUTH AND THROAT WITH OR WITHOUT PRESENCE OF ULCERS (sore throat, sores in mouth, or a toothache) UNUSUAL RASH, SWELLING OR PAIN  UNUSUAL VAGINAL DISCHARGE OR ITCHING   Items with * indicate a potential emergency and should be followed up as soon as possible or go to the Emergency Department if any problems should occur.  Please show the CHEMOTHERAPY  ALERT CARD or IMMUNOTHERAPY ALERT CARD at check-in to the Emergency Department and triage nurse.  Should you have questions after your visit or need to cancel or reschedule your appointment, please contact [facility]  Dept: [phone]  and follow the prompts.  Office hours are 8:00 a.m. to 4:30 p.m. Monday - Friday. Please note that voicemails left after 4:00 p.m. may not be returned until the following business day.  We are closed weekends and major holidays. You have access to a nurse at all times for urgent questions. Please call the main number to the clinic Dept: [phone] and follow the prompts.   For any non-urgent questions, you may also contact your provider using MyChart. We now offer e-Visits for anyone 16 and older to request care online for non-urgent symptoms. For details visit [email or web address].   Also download the MyChart app! Go to the app store, search "MyChart", open the app, select [facility], and log in with your MyChart username and password.

## 2022-02-18 LAB — ALDOLASE: Aldolase: 5.9 U/L (ref 3.3–10.3)

## 2022-02-19 ENCOUNTER — Inpatient Hospital Stay: Payer: BC Managed Care – PPO

## 2022-02-19 VITALS — BP 107/63 | HR 68 | Resp 16

## 2022-02-19 DIAGNOSIS — Z170 Estrogen receptor positive status [ER+]: Secondary | ICD-10-CM

## 2022-02-19 DIAGNOSIS — C50412 Malignant neoplasm of upper-outer quadrant of left female breast: Secondary | ICD-10-CM | POA: Diagnosis not present

## 2022-02-19 MED ORDER — PEGFILGRASTIM-CBQV 6 MG/0.6ML ~~LOC~~ SOSY
6.0000 mg | PREFILLED_SYRINGE | Freq: Once | SUBCUTANEOUS | Status: AC
  Administered 2022-02-19: 6 mg via SUBCUTANEOUS
  Filled 2022-02-19: qty 0.6

## 2022-02-19 NOTE — Patient Instructions (Signed)

## 2022-02-20 ENCOUNTER — Encounter: Payer: Self-pay | Admitting: *Deleted

## 2022-02-20 DIAGNOSIS — Z170 Estrogen receptor positive status [ER+]: Secondary | ICD-10-CM

## 2022-02-21 ENCOUNTER — Other Ambulatory Visit: Payer: Self-pay

## 2022-02-23 ENCOUNTER — Other Ambulatory Visit: Payer: Self-pay | Admitting: Hematology and Oncology

## 2022-02-23 DIAGNOSIS — Z170 Estrogen receptor positive status [ER+]: Secondary | ICD-10-CM

## 2022-02-23 NOTE — Progress Notes (Signed)
ECHO ordered, needs to be scheduled.  Stephanie [name]

## 2022-02-27 ENCOUNTER — Encounter: Payer: Self-pay | Admitting: *Deleted

## 2022-03-04 ENCOUNTER — Ambulatory Visit (HOSPITAL_COMMUNITY)
Admission: RE | Admit: 2022-03-04 | Discharge: 2022-03-04 | Disposition: A | Discharge status: Home / Self Care | Payer: BC Managed Care – PPO | Source: Ambulatory Visit | Attending: Hematology and Oncology | Admitting: Hematology and Oncology

## 2022-03-04 DIAGNOSIS — Z170 Estrogen receptor positive status [ER+]: Secondary | ICD-10-CM | POA: Insufficient documentation

## 2022-03-04 DIAGNOSIS — Z9882 Breast implant status: Secondary | ICD-10-CM | POA: Diagnosis not present

## 2022-03-04 DIAGNOSIS — C50412 Malignant neoplasm of upper-outer quadrant of left female breast: Secondary | ICD-10-CM | POA: Insufficient documentation

## 2022-03-04 DIAGNOSIS — Z0189 Encounter for other specified special examinations: Secondary | ICD-10-CM | POA: Diagnosis not present

## 2022-03-04 LAB — ECHOCARDIOGRAM COMPLETE
AR max vel: 2.33 cm2
AV Area VTI: 2.31 cm2
AV Area mean vel: 2.21 cm2
AV Mean grad: 7 mmHg
AV Peak grad: 10.9 mmHg
Ao pk vel: 1.65 m/s
Area-P 1/2: 3.36 cm2
MV VTI: 1.97 cm2
S' Lateral: 3.1 cm

## 2022-03-04 MED ORDER — PERFLUTREN LIPID MICROSPHERE
1.0000 mL | INTRAVENOUS | Status: AC | PRN
  Administered 2022-03-04: 2 mL via INTRAVENOUS

## 2022-03-04 NOTE — Progress Notes (Signed)
  Echocardiogram 2D Echocardiogram has been performed.  Stephanie [name] 03/04/2022, 9:15 AM

## 2022-03-06 ENCOUNTER — Encounter: Payer: Self-pay | Admitting: Hematology and Oncology

## 2022-03-09 ENCOUNTER — Encounter: Payer: Self-pay | Admitting: Hematology and Oncology

## 2022-03-09 ENCOUNTER — Telehealth: Payer: Self-pay | Admitting: Hematology and Oncology

## 2022-03-09 MED FILL — Fosaprepitant Dimeglumine For IV Infusion 150 MG (Base Eq): INTRAVENOUS | Qty: 5 | Status: AC

## 2022-03-09 MED FILL — Dexamethasone Sodium Phosphate Inj 100 MG/10ML: INTRAMUSCULAR | Qty: 1 | Status: AC

## 2022-03-09 NOTE — Telephone Encounter (Signed)
Patient aware of upcoming appointment

## 2022-03-10 ENCOUNTER — Inpatient Hospital Stay: Payer: BC Managed Care – PPO

## 2022-03-10 ENCOUNTER — Inpatient Hospital Stay: Payer: BC Managed Care – PPO | Admitting: Hematology and Oncology

## 2022-03-10 ENCOUNTER — Encounter: Payer: Self-pay | Admitting: *Deleted

## 2022-03-12 ENCOUNTER — Inpatient Hospital Stay: Payer: BC Managed Care – PPO

## 2022-03-12 MED FILL — Fosaprepitant Dimeglumine For IV Infusion 150 MG (Base Eq): INTRAVENOUS | Qty: 5 | Status: AC

## 2022-03-12 MED FILL — Dexamethasone Sodium Phosphate Inj 100 MG/10ML: INTRAMUSCULAR | Qty: 1 | Status: AC

## 2022-03-13 ENCOUNTER — Inpatient Hospital Stay: Payer: BC Managed Care – PPO | Attending: Hematology and Oncology

## 2022-03-13 ENCOUNTER — Inpatient Hospital Stay (HOSPITAL_BASED_OUTPATIENT_CLINIC_OR_DEPARTMENT_OTHER): Payer: BC Managed Care – PPO | Admitting: Hematology and Oncology

## 2022-03-13 ENCOUNTER — Other Ambulatory Visit: Payer: Self-pay | Admitting: Hematology and Oncology

## 2022-03-13 ENCOUNTER — Inpatient Hospital Stay: Payer: BC Managed Care – PPO

## 2022-03-13 VITALS — BP 104/68 | HR 75 | Temp 98.4°F | Resp 18

## 2022-03-13 DIAGNOSIS — C50412 Malignant neoplasm of upper-outer quadrant of left female breast: Secondary | ICD-10-CM | POA: Diagnosis present

## 2022-03-13 DIAGNOSIS — Z170 Estrogen receptor positive status [ER+]: Secondary | ICD-10-CM | POA: Diagnosis not present

## 2022-03-13 DIAGNOSIS — Z5111 Encounter for antineoplastic chemotherapy: Secondary | ICD-10-CM | POA: Insufficient documentation

## 2022-03-13 DIAGNOSIS — Z79899 Other long term (current) drug therapy: Secondary | ICD-10-CM | POA: Diagnosis not present

## 2022-03-13 LAB — CBC WITH DIFFERENTIAL (CANCER CENTER ONLY)
Abs Immature Granulocytes: 0.01 10*3/uL (ref 0.00–0.07)
Basophils Absolute: 0 10*3/uL (ref 0.0–0.1)
Basophils Relative: 1 %
Eosinophils Absolute: 0 10*3/uL (ref 0.0–0.5)
Eosinophils Relative: 1 %
HCT: 31.5 % — ABNORMAL LOW (ref 36.0–46.0)
Hemoglobin: 10.1 g/dL — ABNORMAL LOW (ref 12.0–15.0)
Immature Granulocytes: 0 %
Lymphocytes Relative: 26 %
Lymphs Abs: 1.4 10*3/uL (ref 0.7–4.0)
MCH: 28.9 pg (ref 26.0–34.0)
MCHC: 32.1 g/dL (ref 30.0–36.0)
MCV: 90 fL (ref 80.0–100.0)
Monocytes Absolute: 0.5 10*3/uL (ref 0.1–1.0)
Monocytes Relative: 9 %
Neutro Abs: 3.6 10*3/uL (ref 1.7–7.7)
Neutrophils Relative %: 63 %
Platelet Count: 196 10*3/uL (ref 150–400)
RBC: 3.5 MIL/uL — ABNORMAL LOW (ref 3.87–5.11)
RDW: 18.7 % — ABNORMAL HIGH (ref 11.5–15.5)
WBC Count: 5.6 10*3/uL (ref 4.0–10.5)
nRBC: 0 % (ref 0.0–0.2)

## 2022-03-13 LAB — CMP (CANCER CENTER ONLY)
ALT: 12 U/L (ref 0–44)
AST: 18 U/L (ref 15–41)
Albumin: 4 g/dL (ref 3.5–5.0)
Alkaline Phosphatase: 45 U/L (ref 38–126)
Anion gap: 7 (ref 5–15)
BUN: 13 mg/dL (ref 6–20)
CO2: 27 mmol/L (ref 22–32)
Calcium: 8.7 mg/dL — ABNORMAL LOW (ref 8.9–10.3)
Chloride: 106 mmol/L (ref 98–111)
Creatinine: 0.79 mg/dL (ref 0.44–1.00)
GFR, Estimated: >60 mL/min (ref >60)
Glucose, Bld: 105 mg/dL — ABNORMAL HIGH (ref 70–99)
Potassium: 4.1 mmol/L (ref 3.5–5.1)
Sodium: 140 mmol/L (ref 135–145)
Total Bilirubin: 0.4 mg/dL (ref 0.3–1.2)
Total Protein: 6.2 g/dL — ABNORMAL LOW (ref 6.5–8.1)

## 2022-03-13 MED ORDER — PALONOSETRON HCL INJECTION 0.25 MG/5ML
0.2500 mg | Freq: Once | INTRAVENOUS | Status: AC
  Administered 2022-03-13: 0.25 mg via INTRAVENOUS
  Filled 2022-03-13: qty 5

## 2022-03-13 MED ORDER — SODIUM CHLORIDE 0.9 % IV SOLN
420.0000 mg | Freq: Once | INTRAVENOUS | Status: AC
  Administered 2022-03-13: 420 mg via INTRAVENOUS
  Filled 2022-03-13: qty 14

## 2022-03-13 MED ORDER — SODIUM CHLORIDE 0.9 % IV SOLN
10.0000 mg | Freq: Once | INTRAVENOUS | Status: AC
  Administered 2022-03-13: 10 mg via INTRAVENOUS
  Filled 2022-03-13: qty 10
  Filled 2022-03-13: qty 1

## 2022-03-13 MED ORDER — TRASTUZUMAB-DKST CHEMO 150 MG IV SOLR
6.0000 mg/kg | Freq: Once | INTRAVENOUS | Status: AC
  Administered 2022-03-13: 420 mg via INTRAVENOUS
  Filled 2022-03-13: qty 20

## 2022-03-13 MED ORDER — SODIUM CHLORIDE 0.9 % IV SOLN
655.0000 mg | Freq: Once | INTRAVENOUS | Status: AC
  Administered 2022-03-13: 650 mg via INTRAVENOUS
  Filled 2022-03-13: qty 65

## 2022-03-13 MED ORDER — SODIUM CHLORIDE 0.9 % IV SOLN
60.0000 mg/m2 | Freq: Once | INTRAVENOUS | Status: AC
  Administered 2022-03-13: 109 mg via INTRAVENOUS
  Filled 2022-03-13: qty 10.9

## 2022-03-13 MED ORDER — SODIUM CHLORIDE 0.9 % IV SOLN: Freq: Once | INTRAVENOUS | Status: AC

## 2022-03-13 MED ORDER — SODIUM CHLORIDE 0.9% FLUSH
10.0000 mL | INTRAVENOUS | Status: DC | PRN
  Administered 2022-03-13: 10 mL

## 2022-03-13 MED ORDER — ACETAMINOPHEN 325 MG PO TABS
650.0000 mg | ORAL_TABLET | Freq: Once | ORAL | Status: AC
  Administered 2022-03-13: 650 mg via ORAL
  Filled 2022-03-13: qty 2

## 2022-03-13 MED ORDER — DIPHENHYDRAMINE HCL 25 MG PO CAPS
50.0000 mg | ORAL_CAPSULE | Freq: Once | ORAL | Status: AC
  Administered 2022-03-13: 50 mg via ORAL
  Filled 2022-03-13: qty 2

## 2022-03-13 MED ORDER — SODIUM CHLORIDE 0.9 % IV SOLN
150.0000 mg | Freq: Once | INTRAVENOUS | Status: AC
  Administered 2022-03-13: 150 mg via INTRAVENOUS
  Filled 2022-03-13: qty 5
  Filled 2022-03-13: qty 150

## 2022-03-13 MED ORDER — HEPARIN SOD (PORK) LOCK FLUSH 100 UNIT/ML IV SOLN
500.0000 [IU] | Freq: Once | INTRAVENOUS | Status: AC | PRN
  Administered 2022-03-13: 500 [IU]

## 2022-03-13 NOTE — [facility] Notes (Signed)
[name] PROGRESS NOTE  Patient Care Team: Sasser, Silvestre Moment, MD as PCP - General (Family Medicine) Erroll Luna, MD as Consulting Physician (General Surgery) Irene Limbo, MD as Consulting Physician (Plastic Surgery) Eppie Gibson, MD as Consulting Physician (Radiation Oncology) Rockwell Germany, RN as Oncology Nurse Navigator Mauro Kaufmann, RN as Oncology Nurse Navigator Azucena Fallen, MD as Consulting Physician (Obstetrics and Gynecology) Lavonna Monarch, MD (Inactive) as Consulting Physician (Dermatology) Azucena Fallen, MD as Consulting Physician (Obstetrics and Gynecology) Warren Danes, PA-C as Physician Assistant (Dermatology) Benay Pike, MD as Consulting Physician (Hematology and Oncology)  CHIEF COMPLAINTS/PURPOSE OF CONSULTATION:  Breast cancer follow up  SUMMARY OF ONCOLOGIC HISTORY: Oncology History  Malignant neoplasm of upper-outer quadrant of female breast ([place]) (Resolved)  10/20/2018 Initial Diagnosis   Malignant neoplasm of upper-outer quadrant of left breast in female, estrogen receptor positive ([place])   10/20/2018 Cancer Staging   Staging form: Breast, AJCC 8th Edition - Clinical stage from 10/20/2018: Stage 0 (cTis (DCIS), cN0, cM0, ER+, PR-) - Signed by Chauncey Cruel, MD on 02/13/2020 Stage prefix: Initial diagnosis   11/03/2018 Genetic Testing   Negative genetic testing:  No pathogenic variants detected on the [facility] Multi-Cancer panel, ordered by Dr. Benjie Karvonen at [facility]. The report date is 11/03/2018.  The Multi-Cancer Panel offered by [facility] includes sequencing and/or deletion duplication testing of the following 84 genes: AIP, ALK, APC, ATM, AXIN2,BAP1,  BARD1, BLM, BMPR1A, BRCA1, BRCA2, BRIP1, CASR, CDC73, CDH1, CDK4, CDKN1B, CDKN1C, CDKN2A (p14ARF), CDKN2A (p16INK4a), CEBPA, CHEK2, CTNNA1, DICER1, DIS3L2, EGFR (c.2369C>T, p.Thr790Met variant only), EPCAM (Deletion/duplication testing only), FH, FLCN,  GATA2, GPC3, GREM1 (Promoter region deletion/duplication testing only), HOXB13 (c.251G>A, p.Gly84Glu), HRAS, KIT, MAX, MEN1, MET, MITF (c.952G>A, p.Glu318Lys variant only), MLH1, MSH2, MSH3, MSH6, MUTYH, NBN, NF1, NF2, NTHL1, PALB2, PDGFRA, PHOX2B, PMS2, POLD1, POLE, POT1, PRKAR1A, PTCH1, PTEN, RAD50, RAD51C, RAD51D, RB1, RECQL4, RET, RUNX1, SDHAF2, SDHA (sequence changes only), SDHB, SDHC, SDHD, SMAD4, SMARCA4, SMARCB1, SMARCE1, STK11, SUFU, TERC, TERT, TMEM127, TP53, TSC1, TSC2, VHL, WRN and WT1.     01/17/2019 Cancer Staging   Staging form: Breast, AJCC 8th Edition - Pathologic stage from 01/17/2019: Stage IA (pT31m, pN0, cM0, G2, ER+, PR-, HER2-) - Signed by CGardenia Phlegm NP on 02/01/2019   Malignant neoplasm of upper-outer quadrant of left breast in female, estrogen receptor positive ([place]  10/21/2021 Breast [place]  Breast ultrasound of the palpable mass showed hypoechoic oval vascular mass with indistinct margins measuring 1.5 x 1 cm x 1.4 cm.  No abnormal lymph nodes found in the left axilla.  She had ultrasound-guided biopsy.  MRI scheduled for tomorrow.   10/29/2021 Pathology Results   Surgical pathology from the left breast needle core biopsy at 130 o'clock showed invasive ductal carcinoma, overall grade 2, prognostics ER 70% positive weak to moderate staining, PR 0% negative, Ki-67 of 80% and HER2 3+   11/18/2021 Initial Diagnosis   Malignant neoplasm of upper-outer quadrant of left breast in female, estrogen receptor positive ([place]   11/18/2021 Cancer Staging   Staging form: Breast, AJCC 8th Edition - Clinical: Stage IA (cT1c, cN0, cM0, G2, ER+, PR-, HER2+) - Signed by IBenay Pike MD on 11/18/2021 Histologic grading system: 3 grade system   11/26/2021 -  Chemotherapy   Patient is on Treatment Plan : BREAST  Docetaxel + Carboplatin + Trastuzumab + Pertuzumab  (TCHP) q21d       INTERIM HISTORY: Stephanie [name] is a 37y.o. female who returns  for a follow up for breast cancer  before C6 TCHP  Today, Stephanie [name] is accompanied by her husband for this visit.  She is here before planned C6D1 of TCHP. She had COVID, very asymptomatic from this. No fevers or chills or SOB, hence will be proceeding with chemo as scheduled. She has mild diarrhea, no neuropathy, breast mass non palpable. Rest of the ROS was reviewed and negative.   MEDICAL HISTORY:  Past Medical History:  Diagnosis Date   Atypical mole 03/14/2019   mod-left side superior   BRCA gene mutation negative in female    Breast cancer [facility])    Family history of breast cancer    Family history of lung cancer    Family history of rectal cancer    Melanoma ([place]) 10/14/2007   level II- right arm- ([facility])   PONV (postoperative nausea and vomiting)     SURGICAL HISTORY: Past Surgical History:  Procedure Laterality Date   BREAST RECONSTRUCTION WITH PLACEMENT OF TISSUE EXPANDER AND ALLODERM Bilateral 01/17/2019   Procedure: BILATERAL BREAST RECONSTRUCTION WITH PLACEMENT OF TISSUE EXPANDER AND ALLODERM;  Surgeon: Irene Limbo, MD;  Location: [facility];  Service: Plastics;  Laterality: Bilateral;   IR IMAGING GUIDED PORT INSERTION  11/25/2021   IR RADIOLOGIST EVAL & MGMT  11/26/2021   LESION REMOVAL Left 06/02/2019   Procedure: MINOR EXICISION OF LESION,LAYERED CLOSURE 1 CM;  Surgeon: Irene Limbo, MD;  Location: [facility];  Service: Plastics;  Laterality: Left;   LIPOSUCTION WITH LIPOFILLING Bilateral 06/02/2019   Procedure: LIPOFILLING FROM ABDOMEN TO BILATERAL CHEST;  Surgeon: Irene Limbo, MD;  Location: [place];  Service: Plastics;  Laterality: Bilateral;   melanoma surgery Right 2009   upper right arm   NIPPLE SPARING MASTECTOMY WITH SENTINEL LYMPH NODE BIOPSY Bilateral 01/17/2019   Procedure: BILATERAL NIPPLE SPARING MASTECTOMIES WITH LEFT SENTINEL LYMPH NODE MAPPING;  Surgeon: Erroll Luna, MD;  Location: [facility];  Service:  General;  Laterality: Bilateral;   REMOVAL OF BILATERAL TISSUE EXPANDERS WITH PLACEMENT OF BILATERAL BREAST IMPLANTS Bilateral 06/02/2019   Procedure: REMOVAL OF BILATERAL TISSUE EXPANDERS WITH PLACEMENT OF BILATERAL SILICONE BREAST IMPLANTS;  Surgeon: Irene Limbo, MD;  Location: [place];  Service: Plastics;  Laterality: Bilateral;   WISDOM TOOTH EXTRACTION      SOCIAL HISTORY: Social History   Socioeconomic History   Marital status: Married    Spouse name: Not on file   Number of children: Not on file   Years of education: Not on file   Highest education level: Not on file  Occupational History   Not on file  Tobacco Use   Smoking status: Never   Smokeless tobacco: Never  Vaping Use   Vaping Use: Never used  Substance and Sexual Activity   Alcohol use: Yes    Comment: occasional   Drug use: Never   Sexual activity: Yes    Birth control/protection: Pill  Other Topics Concern   Not on file  Social History Narrative   Not on file   Social Determinants of Health   Financial Resource Strain: Not on file  Food Insecurity: Not on file  Transportation Needs: No Transportation Needs (11/09/2018)   PRAPARE - Transportation    Lack of Transportation (Medical): No    Lack of Transportation (Non-Medical): No  Physical Activity: Not on file  Stress: Not on file  Social Connections: Not on file  Intimate Partner Violence: Not At Risk (11/09/2018)  Humiliation, Afraid, Rape, and Kick questionnaire    Fear of Current or Ex-Partner: No    Emotionally Abused: No    Physically Abused: No    Sexually Abused: No    FAMILY HISTORY: Family History  Problem Relation Age of Onset   Hypertension Mother    Heart disease Father    Breast cancer Maternal Aunt 60 - 69   Breast cancer Maternal Aunt 59 - 59   Lung cancer Paternal Aunt        diagnosed late 93s   Breast cancer Maternal Grandmother 60 - 69   Breast cancer Paternal Grandmother 28 - 98   Lung cancer  Paternal Grandmother        thought to be breast cancer mets   Rectal cancer Paternal Grandmother        diagnosed 62s   Lung cancer Paternal Grandfather        diagnosed 66s    ALLERGIES:  is allergic to oxycodone.  MEDICATIONS:  Current Outpatient Medications  Medication Sig Dispense Refill   ALPRAZolam (XANAX) 0.5 MG tablet Take 1 tablet (0.5 mg total) by mouth 3 (three) times daily as needed for anxiety. 20 tablet 0   cetirizine (ZYRTEC) 10 MG tablet Take 10 mg by mouth daily.      dexamethasone (DECADRON) 4 MG tablet TAKE 2 TABS 2 TIMES DAILY STARTING DAY BEFORE CHEMO. THEN TAKE 2 TABS DAILY FOR2 DAYS STARTING DAY AFTER CHEMO. TAKE WITH FOOD. 30 tablet 1   doxycycline (VIBRA-TABS) 100 MG tablet Take 1 tablet (100 mg total) by mouth 2 (two) times daily. 30 tablet 2   lidocaine-prilocaine (EMLA) cream Apply to affected area once 30 g 3   meclizine (ANTIVERT) 25 MG tablet Take 25 mg by mouth 3 (three) times daily as needed (Vertigo/Motion Sickness).     Multiple Vitamins-Minerals (MULTIVITAMIN WITH MINERALS) tablet Take 1 tablet by mouth daily.     ondansetron (ZOFRAN) 8 MG tablet TAKE 1 TABLET BY MOUTH EVERY 8 HOURS AS NEEDED FOR NAUSEA & VOMITING START ON THE THIRD DAY AFTER CHEMO. 30 tablet 1   prochlorperazine (COMPAZINE) 10 MG tablet TAKE 1 TABLET BY MOUTH EVERY 6 HOURS AS NEEDED FOR NAUSEA & VOMITING 30 tablet 1   No current facility-administered medications for this visit.   Facility-Administered Medications Ordered in Other Visits  Medication Dose Route Frequency Provider Last Rate Last Admin   dexamethasone (DECADRON) 10 mg in sodium chloride 0.9 % 50 mL IVPB  10 mg Intravenous Once Benay Pike, MD 204 mL/hr at 03/13/22 1242 10 mg at 03/13/22 1242   fosaprepitant (EMEND) 150 mg in sodium chloride 0.9 % 145 mL IVPB  150 mg Intravenous Once Benay Pike, MD 450 mL/hr at 03/13/22 1243 150 mg at 03/13/22 1243   heparin lock flush 100 unit/mL  500 Units Intracatheter Once  PRN Narjis Mira, [name], MD       pertuzumab (PERJETA) 420 mg in sodium chloride 0.9 % 250 mL chemo infusion  420 mg Intravenous Once Clarisse Rodriges, MD       sodium chloride flush (NS) 0.9 % injection 10 mL  10 mL Intracatheter PRN Arika Mainer, MD       trastuzumab-dkst (OGIVRI) 420 mg in sodium chloride 0.9 % 250 mL chemo infusion  6 mg/kg (Treatment Plan Recorded) Intravenous Once Camaya Gannett, MD        PHYSICAL EXAMINATION: ECOG PERFORMANCE STATUS: 1 - Symptomatic but completely ambulatory  There were no vitals filed for this visit.  There were no vitals filed for this visit.    [ Day 1 ] 01/06/22  Height '5\' 6"'$  (1.676 m)  Weight 152 lb 8 oz (69.2 kg)  BSA (Calculated - sq m) 1.79 sq meters  Temp 97.7 F (36.5 C)  Temp src Oral  Pulse 68  Resp 16  BP 109/71    Physical Exam Constitutional:      Appearance: Normal appearance.  Cardiovascular:     Rate and Rhythm: Normal rate and regular rhythm.  Musculoskeletal:        General: Swelling (trace bilateral ankle swelling) present.     Cervical back: Normal range of motion and neck supple. No rigidity.  Lymphadenopathy:     Cervical: No cervical adenopathy.  Skin:    General: Skin is warm and dry.  Neurological:     General: No focal deficit present.     Mental Status: She is alert.      LABORATORY DATA:  I have reviewed the data as listed Lab Results  Component Value Date   WBC 5.6 03/13/2022   HGB 10.1 (L) 03/13/2022   HCT 31.5 (L) 03/13/2022   MCV 90.0 03/13/2022   PLT 196 03/13/2022   Lab Results  Component Value Date   NA 140 02/17/2022   K 3.9 02/17/2022   CL 106 02/17/2022   CO2 26 02/17/2022    RADIOGRAPHIC STUDIES: I have personally reviewed the radiological reports and agreed with the findings in the report.  ASSESSMENT AND PLAN: Stephanie [name] is a 37 y.o. female who presents to the clinic for follow up for left breast cancer.   #Malignant neoplasm of upper-outer quadrant of left  breast: -Breast US from 10/21/2021 showed showed hypoechoic oval vascular mass with indistinct margins measuring 1.5 x 1 cm x 1.4 cm.  -Grade 2, ER +70% weak to moderate staining, PR 0% negative, HER2 3+ by IHC, Ki-67 of 80%  -Baseline breast MRI from 11/19/2021 showed tumor size measuring up to 3.6 cm. No regional adenopathy. CT CAP and bone scan from 12/03/2021 showed no evidence of metastatic disease. -Recommended neoadjuvant chemotherapy with TCHP, started on 11/26/2021 -Due for Cycle 6 Day 1 of TCHP.  -- Given multiple adverse effects which have been bothersome since her last visit, we have dose reduced the docetaxel to 60 mg/m and carboplatin to AUC of 5. She is tolerating this dose very well.  She is clinically asymptomatic from the concomitant COVID-19 infection with no systemic signs, hence we have agreed to proceed with treatment as planned.  #Diarrhea: --Grade 1 --Secondary to chemotherapy --Encouraged to use imodium with each episode and contact clinic if she has more than 4 episodes per day.   #Nausea without vomiting:  --Grade 1 --Secondary to chemotherapy --Encouraged to take prescribed antiemetics as directed.   #Acneiform rash: --She uses doxycyline as needed.  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  -Underwent bilateral nipple sparing mastectomies with left sentinel lymph node sampling .  -Pathology: (a) on the right, no evidence of malignancy (b) on the left, pT62m pN0, stage IA invasive carcinoma, grade 2, with negative margins          (i) four left axillary sentinel nodes removed          (ii) ductal carcinoma in situ, grade 3, also present, with negative margins          (iii) insufficient invasive tissue for prognostic panel evaluation  -Patient opted against anti-estrogen therapy which was optional.  -  Adjuvant radiation was not indicated.   # Lower extremity ankle swelling, mild today.  This could repeat related to taxane.  Will continue to  monitor.  # COVID 19, mild sore throat, no fevers, chills, shortness of breath.  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  All questions were answered. The patient knows to call the clinic with any problems, questions or concerns.  I have spent a total of 30 minutes minutes of face-to-face and non-face-to-face time, preparing to see the patient, performing a medically appropriate examination, counseling and educating the patient, documenting clinical information in the electronic health record,and care coordination.   Benay Pike MD  Phone: [phone]

## 2022-03-16 ENCOUNTER — Other Ambulatory Visit: Payer: Self-pay

## 2022-03-16 ENCOUNTER — Inpatient Hospital Stay: Payer: BC Managed Care – PPO

## 2022-03-16 VITALS — BP 108/76 | HR 59 | Temp 98.6°F | Resp 18

## 2022-03-16 DIAGNOSIS — Z170 Estrogen receptor positive status [ER+]: Secondary | ICD-10-CM

## 2022-03-16 DIAGNOSIS — C50412 Malignant neoplasm of upper-outer quadrant of left female breast: Secondary | ICD-10-CM | POA: Diagnosis not present

## 2022-03-16 MED ORDER — PEGFILGRASTIM-CBQV 6 MG/0.6ML ~~LOC~~ SOSY
6.0000 mg | PREFILLED_SYRINGE | Freq: Once | SUBCUTANEOUS | Status: AC
  Administered 2022-03-16: 6 mg via SUBCUTANEOUS
  Filled 2022-03-16: qty 0.6

## 2022-03-17 ENCOUNTER — Other Ambulatory Visit: Payer: Self-pay | Admitting: Pharmacist

## 2022-03-20 ENCOUNTER — Ambulatory Visit
Admission: RE | Admit: 2022-03-20 | Discharge: 2022-03-20 | Disposition: A | Discharge status: Home / Self Care | Payer: BC Managed Care – PPO | Source: Ambulatory Visit | Attending: Hematology and Oncology | Admitting: Hematology and Oncology

## 2022-03-20 DIAGNOSIS — Z170 Estrogen receptor positive status [ER+]: Secondary | ICD-10-CM

## 2022-03-20 MED ORDER — GADOPICLENOL 0.5 MMOL/ML IV SOLN
7.5000 mL | Freq: Once | INTRAVENOUS | Status: AC | PRN
  Administered 2022-03-20: 7.5 mL via INTRAVENOUS

## 2022-03-23 ENCOUNTER — Ambulatory Visit: Payer: Self-pay | Admitting: Surgery

## 2022-03-23 DIAGNOSIS — C50912 Malignant neoplasm of unspecified site of left female breast: Secondary | ICD-10-CM

## 2022-03-24 ENCOUNTER — Telehealth: Payer: Self-pay | Admitting: Radiation Oncology

## 2022-03-24 ENCOUNTER — Other Ambulatory Visit: Payer: Self-pay | Admitting: *Deleted

## 2022-03-24 ENCOUNTER — Other Ambulatory Visit: Payer: Self-pay | Admitting: Hematology and Oncology

## 2022-03-24 DIAGNOSIS — Z170 Estrogen receptor positive status [ER+]: Secondary | ICD-10-CM

## 2022-03-24 NOTE — Telephone Encounter (Signed)
LVM to schedule CON with Dr.Squire 

## 2022-03-25 ENCOUNTER — Other Ambulatory Visit: Payer: Self-pay | Admitting: Surgery

## 2022-03-25 DIAGNOSIS — C50912 Malignant neoplasm of unspecified site of left female breast: Secondary | ICD-10-CM

## 2022-03-25 NOTE — [facility] Notes (Signed)
Location of Breast Cancer: Malignant neoplasm of upper-outer quadrant of left breast in female, estrogen receptor positive   Histology per Pathology Report:  11-03-21    Receptor Status: ER(70% positive), PR (0%), Her2-neu (3+), Ki-67(80%)  Did patient present with symptoms (if so, please note symptoms) or was this found on screening mammography?: pt had a palpable mass  Past/Anticipated interventions by surgeon, if any: Dr. Brantley Stage 03-23-22 History of Present Illness: Stephanie [name] is a 37 y.o. female who is seen today for follow-up after recurrent left breast cancer after bilateral nipple sparing mastectomies in 2021. She was seen last October after mass recurred in the left upper outer quadrant. Core biopsy of this was invasive ductal carcinoma grade 3 ER positive PR negative HER2/neu negative with AKI 67 of 80%. She received adjuvant therapy and has finished that. Repeat MRI shows about a 50% reduction in size of the area of enhancement without lymphadenopathy. She is here today to discuss surgical treatment. She is done fairly well chemotherapy but her dose was reduced due to toxicities of diarrhea..  Assessment and Plan:  Diagnoses and all orders for this visit:  Recurrent breast cancer, left (CMS-HCC)  Patient had a good response to chemotherapy. Certainly recommend local excision of this area using a seed to guide [place]. Discussed sentinel lymph node mapping. She had 4 lymph nodes removed back in 2021 when she underwent nipple preserving bilateral mastectomies. Certainly an attempt could be made to remap her but given her physical exam findings and MRI findings, the benefit of lymph node dissection was reviewed as well as complications of lymphedema and shoulder stiffness. I think an attempt at mapping is warranted. We discussed lymph node dissection and the pros and cons of this as well as reviewing survival statistics. At this point in time, she would like to stay away from that if  possible. I think she would benefit from input from radiation oncology and plastic surgery and I will reach out to the specialties. Will plan on seed lumpectomy left left-sided lymph node mapping and get that scheduled. Also reviewed removal of the implant as well which I do not think is necessary at this point in time.The procedure has been discussed with the patient. Alternatives to surgery have been discussed with the patient. Risks of surgery include bleeding, Infection, Seroma formation, death, and the need for further surgery. The patient understands and wishes to proceed.  No follow-ups on file.  Kennieth Francois, MD   Right chest Port placed on 11-25-21  06-02-19 PROCEDURE:  1. Removal bilateral chest tissue expanders and placement silicone implants 2. Lipofilling to bilateral chest total 150 ml 3. Excision benign lesion left chest 1 cm 4. Layered closure left chest 1.5 cm   SURGEON: Irene Limbo MD [facility]  01-17-19 Preoperative diagnosis: Left breast DCIS with high risk of developing breast cancer over her lifetime over 20%   Postoperative diagnosis: Same   Procedure: Bilateral nipple sparing mastectomy with left axillary sentinel lymph node mapping   Surgeon: Erroll Luna, MD   Assistant:Dr Iran Planas MD    Past/Anticipated interventions by medical oncology, if any:  Dr. Chryl Heck on 02-17-22 SUMMARY OF ONCOLOGIC HISTORY:    Oncology History  Malignant neoplasm of upper-outer quadrant of female breast ([place]) (Resolved)  10/20/2018 Initial Diagnosis    Malignant neoplasm of upper-outer quadrant of left breast in female, estrogen receptor positive ([place])    10/20/2018 Cancer Staging    Staging form: Breast, AJCC 8th Edition - Clinical stage from 10/20/2018:  Stage 0 (cTis (DCIS), cN0, cM0, ER+, PR-) - Signed by Chauncey Cruel, MD on 02/13/2020 Stage prefix: Initial diagnosis    11/03/2018 Genetic Testing    Negative genetic testing:  No pathogenic variants detected on the  [facility] Multi-Cancer panel, ordered by Dr. Benjie Karvonen at [facility]. The report date is 11/03/2018.   The Multi-Cancer Panel offered by [facility] includes sequencing and/or deletion duplication testing of the following 84 genes: AIP, ALK, APC, ATM, AXIN2,BAP1,  BARD1, BLM, BMPR1A, BRCA1, BRCA2, BRIP1, CASR, CDC73, CDH1, CDK4, CDKN1B, CDKN1C, CDKN2A (p14ARF), CDKN2A (p16INK4a), CEBPA, CHEK2, CTNNA1, DICER1, DIS3L2, EGFR (c.2369C>T, p.Thr790Met variant only), EPCAM (Deletion/duplication testing only), FH, FLCN, GATA2, GPC3, GREM1 (Promoter region deletion/duplication testing only), HOXB13 (c.251G>A, p.Gly84Glu), HRAS, KIT, MAX, MEN1, MET, MITF (c.952G>A, p.Glu318Lys variant only), MLH1, MSH2, MSH3, MSH6, MUTYH, NBN, NF1, NF2, NTHL1, PALB2, PDGFRA, PHOX2B, PMS2, POLD1, POLE, POT1, PRKAR1A, PTCH1, PTEN, RAD50, RAD51C, RAD51D, RB1, RECQL4, RET, RUNX1, SDHAF2, SDHA (sequence changes only), SDHB, SDHC, SDHD, SMAD4, SMARCA4, SMARCB1, SMARCE1, STK11, SUFU, TERC, TERT, TMEM127, TP53, TSC1, TSC2, VHL, WRN and WT1.      01/17/2019 Cancer Staging    Staging form: Breast, AJCC 8th Edition - Pathologic stage from 01/17/2019: Stage IA (pT2mi, pN0, cM0, G2, ER+, PR-, HER2-) - Signed by Gardenia Phlegm, NP on 02/01/2019    Malignant neoplasm of upper-outer quadrant of left breast in female, estrogen receptor positive ([place])  10/21/2021 Breast US    Breast ultrasound of the palpable mass showed hypoechoic oval vascular mass with indistinct margins measuring 1.5 x 1 cm x 1.4 cm.  No abnormal lymph nodes found in the left axilla.  She had ultrasound-guided biopsy.  MRI scheduled for tomorrow.    10/29/2021 Pathology Results    Surgical pathology from the left breast needle core biopsy at 130 o'clock showed invasive ductal carcinoma, overall grade 2, prognostics ER 70% positive weak to moderate staining, PR 0% negative, Ki-67 of 80% and HER2 3+    11/18/2021 Initial Diagnosis    Malignant neoplasm of  upper-outer quadrant of left breast in female, estrogen receptor positive ([place])    11/18/2021 Cancer Staging    Staging form: Breast, AJCC 8th Edition - Clinical: Stage IA (cT1c, cN0, cM0, G2, ER+, PR-, HER2+) - Signed by Benay Pike, MD on 11/18/2021 Histologic grading system: 3 grade system    11/26/2021 -  Chemotherapy    Patient is on Treatment Plan : BREAST  Docetaxel + Carboplatin + Trastuzumab + Pertuzumab  (TCHP) q21d       ASSESSMENT AND PLAN: Stephanie [name] is a 37 y.o. female who presents to the clinic for follow up for left breast cancer.    #Malignant neoplasm of upper-outer quadrant of left breast: -Breast US from 10/21/2021 showed showed hypoechoic oval vascular mass with indistinct margins measuring 1.5 x 1 cm x 1.4 cm.  -Grade 2, ER +70% weak to moderate staining, PR 0% negative, HER2 3+ by IHC, Ki-67 of 80%  -Baseline breast MRI from 11/19/2021 showed tumor size measuring up to 3.6 cm. No regional adenopathy. CT CAP and bone scan from 12/03/2021 showed no evidence of metastatic disease. -Recommended neoadjuvant chemotherapy with TCHP, started on 11/26/2021 -Due for Cycle 4 Day 1 of TCHP.  -- Given multiple adverse effects which have been bothersome since her last visit, we have dose reduced the docetaxel to 60 mg/m and carboplatin to AUC of 5. --NO new dose limiting toxicity, ok to proceed with treatment as scheduled.   #Diarrhea: --Grade  1 --Secondary to chemotherapy --Encouraged to use imodium with each episode and contact clinic if she has more than 4 episodes per day.    #Nausea without vomiting:  --Grade 1 --Secondary to chemotherapy --Encouraged to take prescribed antiemetics as directed.    #Acneiform rash: --Involving scalp, chest and neck. --Likely secondary to chemotherapy --She uses doxycyline as needed.   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  -Underwent bilateral nipple sparing mastectomies with left sentinel lymph node sampling  .  -Pathology: (a) on the right, no evidence of malignancy (b) on the left, pT61mi pN0, stage IA invasive carcinoma, grade 2, with negative margins          (i) four left axillary sentinel nodes removed          (ii) ductal carcinoma in situ, grade 3, also present, with negative margins          (iii) insufficient invasive tissue for prognostic panel evaluation  -Patient opted against anti-estrogen therapy which was optional.  -Adjuvant radiation was not indicated.    # Lower extremity ankle swelling, mild today.  This could repeat related to taxane.  Will continue to monitor.   All questions were answered. The patient knows to call the clinic with any problems, questions or concerns.   I have spent a total of 30 minutes minutes of face-to-face and non-face-to-face time, preparing to see the patient, performing a medically appropriate examination, counseling and educating the patient, documenting clinical information in the electronic health record,and care coordination.    Benay Pike MD  Lymphedema issues, if any:  {:18581} {t:21944}   Pain issues, if any:  {:18581} {PAIN DESCRIPTION:[id number]}  SAFETY ISSUES: Prior radiation? {:18581} Pacemaker/ICD? {:18581} Possible current pregnancy?{:18581} Is the patient on methotrexate? {:18581}  Current Complaints / other details:  ***

## 2022-03-26 ENCOUNTER — Other Ambulatory Visit: Payer: Self-pay | Admitting: *Deleted

## 2022-03-26 ENCOUNTER — Encounter: Payer: Self-pay | Admitting: *Deleted

## 2022-03-26 DIAGNOSIS — C50412 Malignant neoplasm of upper-outer quadrant of left female breast: Secondary | ICD-10-CM

## 2022-03-29 NOTE — Therapy (Signed)
OUTPATIENT PHYSICAL THERAPY BREAST CANCER BASELINE EVALUATION   Patient Name: Stephanie [name] MRN: [id number] DOB:March 21, 1985, 37 y.o., female Today's Date: 03/30/2022  END OF SESSION:  PT End of Session - 03/30/22 0754     Visit Number 1    Number of Visits 2    Date for PT Re-Evaluation 06/08/22    PT Start Time 0800    PT Stop Time 0832    PT Time Calculation (min) 32 min    Activity Tolerance Patient tolerated treatment well    Behavior During Therapy [facility] for tasks assessed/performed             Past Medical History:  Diagnosis Date   Atypical mole 03/14/2019   mod-left side superior   BRCA gene mutation negative in female    Breast cancer [facility])    Family history of breast cancer    Family history of lung cancer    Family history of rectal cancer    Melanoma ([place]) 10/14/2007   level II- right arm- (EXC)   PONV (postoperative nausea and vomiting)    Past Surgical History:  Procedure Laterality Date   BREAST RECONSTRUCTION WITH PLACEMENT OF TISSUE EXPANDER AND ALLODERM Bilateral 01/17/2019   Procedure: BILATERAL BREAST RECONSTRUCTION WITH PLACEMENT OF TISSUE EXPANDER AND ALLODERM;  Surgeon: Irene Limbo, MD;  Location: [place];  Service: Plastics;  Laterality: Bilateral;   IR IMAGING GUIDED PORT INSERTION  11/25/2021   IR RADIOLOGIST EVAL & MGMT  11/26/2021   LESION REMOVAL Left 06/02/2019   Procedure: MINOR EXICISION OF LESION,LAYERED CLOSURE 1 CM;  Surgeon: Irene Limbo, MD;  Location: [place];  Service: Plastics;  Laterality: Left;   LIPOSUCTION WITH LIPOFILLING Bilateral 06/02/2019   Procedure: LIPOFILLING FROM ABDOMEN TO BILATERAL CHEST;  Surgeon: Irene Limbo, MD;  Location: [place];  Service: Plastics;  Laterality: Bilateral;   melanoma surgery Right 2009   upper right arm   NIPPLE SPARING MASTECTOMY WITH SENTINEL LYMPH NODE BIOPSY Bilateral 01/17/2019   Procedure: BILATERAL NIPPLE SPARING  MASTECTOMIES WITH LEFT SENTINEL LYMPH NODE MAPPING;  Surgeon: Erroll Luna, MD;  Location: [place];  Service: General;  Laterality: Bilateral;   REMOVAL OF BILATERAL TISSUE EXPANDERS WITH PLACEMENT OF BILATERAL BREAST IMPLANTS Bilateral 06/02/2019   Procedure: REMOVAL OF BILATERAL TISSUE EXPANDERS WITH PLACEMENT OF BILATERAL SILICONE BREAST IMPLANTS;  Surgeon: Irene Limbo, MD;  Location: [place];  Service: Plastics;  Laterality: Bilateral;   WISDOM TOOTH EXTRACTION     Patient Active Problem List   Diagnosis Date Noted   Malignant neoplasm of upper-outer quadrant of left breast in female, estrogen receptor positive ([place]) 11/18/2021   Family history of breast cancer    Family history of rectal cancer    Family history of lung cancer    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   Genetic testing 11/03/2018   Hip pain, acute, left 07/30/2016   Neck pain 08/10/2011   Musculoskeletal pain 08/10/2011   KNEE PAIN, RIGHT 01/30/2010   ITBS, RIGHT KNEE 01/30/2010   UNEQUAL LEG LENGTH 01/30/2010   ABNORMALITY OF GAIT 01/30/2010    REFERRING PROVIDER: Dr. Chryl Heck  REFERRING DIAG: C50.412,Z17.0 (ICD-10-CM) - Malignant neoplasm of upper-outer quadrant of left breast in female, estrogen receptor positive ([place])   THERAPY DIAG:  Malignant neoplasm of upper-outer quadrant of left breast in female, estrogen receptor positive ([place])  At risk for lymphedema  Abnormal posture  Rationale for Evaluation and Treatment: Rehabilitation  ONSET DATE: 10/21/21  SUBJECTIVE:                                                                                                                                                                                           SUBJECTIVE STATEMENT: Patient reports she is here today to be seen by her medical team for her newly diagnosed left breast cancer. I am having some neuropathy.  Fatiguing easily and hard to do  normal walking.    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 She was treated for left breast cancer DCIS in 2020 where she underwent bilateral nipple sparing mastectomies with implants and Lt SLNB 4 negative nodes rermoved. No radiation completed. Pt is now done on TCHP. Will be having a left lumpectomy with SLNB (if possible) on 04/30/22. Will also be having radiation.  No episodes of arm swelling.    PATIENT GOALS:   reduce lymphedema risk and learn post op HEP.   PAIN:  Are you having pain? Some port pain on the right side   PRECAUTIONS: Active CA  HAND DOMINANCE: right  WEIGHT BEARING RESTRICTIONS: No  FALLS:  Has patient fallen in last 6 months? 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   LEISURE: hunting, fishing, exercise, hiking  PRIOR LEVEL OF FUNCTION: Independent   OBJECTIVE:  COGNITION: Overall cognitive status: Within functional limits for tasks assessed    POSTURE:  Forward head and rounded shoulders posture  UPPER EXTREMITY AROM/PROM:  A/PROM RIGHT   eval   Shoulder extension 60  Shoulder flexion 160  Shoulder abduction 165  Shoulder internal rotation   Shoulder external rotation 95    (Blank rows = not tested)  A/PROM LEFT   eval  Shoulder extension 52  Shoulder flexion 163  Shoulder abduction 165  Shoulder internal rotation   Shoulder external rotation 90    (Blank rows = not tested)  CERVICAL AROM: All within normal limits:   LYMPHEDEMA ASSESSMENTS:   LANDMARK RIGHT   eval  10 cm proximal to olecranon process 31.5  Olecranon process 25.5  10 cm proximal to ulnar styloid process 21.4  Just proximal to ulnar styloid process 15.1  Across hand at thumb web space 18.7  At base of 2nd digit 6.0  (Blank rows = not tested)  LANDMARK LEFT   eval  10 cm proximal to olecranon process 31.6  Olecranon process  26.5  10 cm proximal to ulnar styloid process 21.8  Just proximal to ulnar styloid process 15.5  Across hand at thumb web space 18.0  At base of 2nd digit 6.0  (Blank rows = not tested)  L-DEX LYMPHEDEMA SCREENING: The patient was assessed using the L-Dex machine today to produce a lymphedema index baseline score. The patient will be reassessed on a regular basis (typically every 3 months) to obtain new L-Dex scores. If the score is > 6.5 points away from his/her baseline score indicating onset of subclinical lymphedema, it will be recommended to wear a compression garment for 4 weeks, 12 hours per day and then be reassessed. If the score continues to be > 6.5 points from baseline at reassessment, we will initiate lymphedema treatment. Assessing in this manner has a 95% rate of preventing clinically significant lymphedema.  PATIENT EDUCATION:  Education details: Lymphedema risk reduction and post op shoulder/posture HEP Person educated: Patient Education method: Explanation, Demonstration, Handout Education comprehension: Patient verbalized understanding and returned demonstration  HOME EXERCISE PROGRAM: Patient was instructed today in a home exercise program today for post op shoulder range of motion. These included active assist shoulder flexion in sitting, scapular retraction, wall walking with shoulder abduction, and hands behind head external rotation.  She was encouraged to do these twice a day, holding 3 seconds and repeating 5 times when permitted by her physician.   ASSESSMENT:  CLINICAL IMPRESSION: Pt is planning to have a left lumpectomy and SLNB on the left after bil mastectomy with SLNB if possible.  She just completed chemotherapy and reported some trouble with getting back to her baseline exercise level.  We discussed recommendations for exercise for CIPN and fatigue and how she may have some increasing symptoms for the next week or two with taxol. She will benefit from a post  op PT reassessment to determine needs and from L-Dex screens every 3 months for 2 years to detect subclinical lymphedema.  Pt will benefit from skilled therapeutic intervention to improve on the following deficits: Decreased knowledge of precautions, impaired UE functional use, pain, decreased ROM, postural dysfunction.   PT treatment/interventions: ADL/self-care home management, pt/family education, therapeutic exercise  REHAB POTENTIAL: Excellent  CLINICAL DECISION MAKING: Stable/uncomplicated  EVALUATION COMPLEXITY: Low   GOALS: Goals reviewed with patient? YES  LONG TERM GOALS: (STG=LTG)    Name Target Date Goal status  1 Pt will be able to verbalize understanding of pertinent lymphedema risk reduction practices relevant to her dx specifically related to skin care.  Baseline:  No knowledge 03/30/2022 Achieved at eval  2 Pt will be able to return demo and/or verbalize understanding of the post op HEP related to regaining shoulder ROM. Baseline:  No knowledge 03/30/2022 Achieved at eval  3 Pt will be able to verbalize understanding of the importance of attending the post op After Breast CA Class for further lymphedema risk reduction education and therapeutic exercise.  Baseline:  No knowledge 03/30/2022 Achieved at eval  4 Pt will demo she has regained full shoulder ROM and function post operatively compared to baselines.  Baseline: See objective measurements taken today. 06/08/22     PLAN:  PT FREQUENCY/DURATION: EVAL and 1 follow up appointment.   PLAN FOR NEXT SESSION: will reassess 3-4 weeks post op to determine needs.   Patient will follow up at outpatient cancer rehab 3-4 weeks following surgery.  If the patient requires physical therapy at that time, a specific plan will be dictated and sent to the referring physician for approval. The patient was educated today on appropriate basic range of motion exercises to begin post operatively and  the importance of attending the After  Breast Cancer class following surgery.  Patient was educated today on lymphedema risk reduction practices as it pertains to recommendations that will benefit the patient immediately following surgery.  She verbalized good understanding.    Physical Therapy Information for After Breast Cancer Surgery/Treatment:  Lymphedema is a swelling condition that you may be at risk for in your arm if you have lymph nodes removed from the armpit area.  After a sentinel node biopsy, the risk is approximately 5-9% and is higher after an axillary node dissection.  There is treatment available for this condition and it is not life-threatening.  Contact your physician or physical therapist with concerns. You may begin the 4 shoulder/posture exercises (see additional sheet) when permitted by your physician (typically a week after surgery).  If you have drains, you may need to wait until those are removed before beginning range of motion exercises.  A general recommendation is to not lift your arms above shoulder height until drains are removed.  These exercises should be done to your tolerance and gently.  This is not a "no pain/no gain" type of recovery so listen to your body and stretch into the range of motion that you can tolerate, stopping if you have pain.  If you are having immediate reconstruction, ask your plastic surgeon about doing exercises as he or she may want you to wait. We encourage you to attend the free one time ABC (After Breast Cancer) class offered by [facility] [facility].  You will learn information related to lymphedema risk, prevention and treatment and additional exercises to regain mobility following surgery.  You can call [phone] for more information.  This is offered the 1st and 3rd Monday of each month.  You only attend the class one time. While undergoing any medical procedure or treatment, try to avoid blood pressure being taken or needle sticks from occurring on the arm on the  side of cancer.   This recommendation begins after surgery and continues for the rest of your life.  This may help reduce your risk of getting lymphedema (swelling in your arm). An excellent resource for those seeking information on lymphedema is the National Lymphedema Network's web site. It can be accessed at [facility]org If you notice swelling in your hand, arm or breast at any time following surgery (even if it is many years from now), please contact your doctor or physical therapist to discuss this.  Lymphedema can be treated at any time but it is easier for you if it is treated early on.  If you feel like your shoulder motion is not returning to normal in a reasonable amount of time, please contact your surgeon or physical therapist.  [facility] [phone]. [address], [address], [place] [address]  ABC CLASS After Breast Cancer Class  After Breast Cancer Class is a specially designed exercise class to assist you in a safe recover after having breast cancer surgery.  In this class you will learn how to get back to full function whether your drains were just removed or if you had surgery a month ago.  This one-time class is held the 1st and 3rd Monday of every month from 11:00 a.m. until 12:00 noon virtually.  This class is FREE and space is limited. For more information or to register for the next available class, call [phone].  Class Goals  Understand specific stretches to improve the flexibility of you chest  and shoulder. Learn ways to safely strengthen your upper body and improve your posture. Understand the warning signs of infection and why you may be at risk for an arm infection. Learn about Lymphedema and prevention.  ** You do not attend this class until after surgery.  Drains must be removed to participate  Patient was instructed today in a home exercise program today for post op shoulder range of motion. These included active  assist shoulder flexion in sitting, scapular retraction, wall walking with shoulder abduction, and hands behind head external rotation.  She was encouraged to do these twice a day, holding 3 seconds and repeating 5 times when permitted by her physician.    Stark Bray, PT 03/30/2022, 8:44 AM

## 2022-03-30 ENCOUNTER — Encounter: Payer: Self-pay | Admitting: Rehabilitation

## 2022-03-30 ENCOUNTER — Ambulatory Visit: Payer: BC Managed Care – PPO | Attending: Hematology and Oncology | Admitting: Rehabilitation

## 2022-03-30 DIAGNOSIS — Z170 Estrogen receptor positive status [ER+]: Secondary | ICD-10-CM | POA: Insufficient documentation

## 2022-03-30 DIAGNOSIS — R293 Abnormal posture: Secondary | ICD-10-CM | POA: Insufficient documentation

## 2022-03-30 DIAGNOSIS — Z9189 Other specified personal risk factors, not elsewhere classified: Secondary | ICD-10-CM

## 2022-03-30 DIAGNOSIS — Z9013 Acquired absence of bilateral breasts and nipples: Secondary | ICD-10-CM | POA: Diagnosis not present

## 2022-03-30 DIAGNOSIS — C50412 Malignant neoplasm of upper-outer quadrant of left female breast: Secondary | ICD-10-CM | POA: Insufficient documentation

## 2022-03-30 DIAGNOSIS — G629 Polyneuropathy, unspecified: Secondary | ICD-10-CM | POA: Diagnosis not present

## 2022-03-31 NOTE — Progress Notes (Signed)
Radiation Oncology         [id number]) [phone] ________________________________  Outpatient Re-Consultation  Name: Stephanie [name] MRN: [id number]  Date: 04/01/2022  DOB: [birth year]  [id number], [name], MD  Benay Pike, MD   REFERRING PHYSICIAN: Benay Pike, MD  DIAGNOSIS:    ICD-10-CM   1. 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 female who presents today to discuss the role of radiation therapy in management of her recent recurrence of left breast cancer. The patient is know to me for his history of left breast DCIS diagnosed in 2020.  She underwent bilateral mastectomies with clear margins and had T52mic node-negative disease.  Therefore, she did not receive postoperative radiation.  Unfortunately she developed a recurrence in the left reconstructed breast and has completed neoadjuvant chemotherapy for that.  She anticipates surgical resection and sentinel lymph node biopsy in [place].  She has been discussed at our multidisciplinary conference:  In October of 2023, the patient presented with a new palpable lump in the lateral aspect of the reconstructed left breast  Ultrasound of breast on 10/21/21 revealed a suspicious 1.5 cm mass at the palpable site of concern in the 1:30 o'clock left breast. No left axillary lymphadenopathy was appreciated.   Biopsy of the 1:30 o'clock eft breast on date of 10/30/22 showed grade 2 invasive ductal carcinoma measuring 1.1 cm in the greatest linear extent of the sample with  LVI.  ER status: 70% positive with weak-moderate staining intensity; PR status 0% negative; Proliferation marker Ki67 at 80%; Her2 status positive; Grade 2.  Bilateral breast MRI on 11/19/21 showed the 3.8 x 2.0 x 1.6 cm biopsy-proven malignancy in the upper-outer quadrant of the left breast. The base of the mass appeared to be positioned against the patient's implant. MRI otherwise showed no evidence of malignancy in the right breast, and no evidence of lymphadenopathy.  Staging CT CAP with contrast on 12/03/21 showed no evidence of metastatic disease (or other acute abnormalities) in the chest, abdomen, or pelvis.  Whole body bone scan on 12/03/21 showed no evidence of osseous metastatic disease.  Chemotherapy: The patient has been treated with neoadjuvant chemotherapy consisting of TCHP x 6 cycles from 11/26/21 through 03/13/22 under the care of Dr. Chryl Heck. Chemo toxicities encountered by the patient have included diarrhea and nausea (without emesis). Starting with cycle 5, her treatment dose was reduced due to issues with diarrhea (improved with dose reduction).   Bilateral breast MRI on 03/20/22 (s/p neoadjuvant chemotherapy) showed significant improvement in the known malignancy in the UOQ of the left breast, measuring 1.5 x 0.7 cm, previously 3.8 x 2.0 cm. MRI also showed no new or suspicious findings in either breast, and no evidence of lymphadenopathy in either axilla.   The patient has met with Dr. Brantley Stage and she has opted to proceed with breast conserving surgery and SLN mapping. Her procedure is currently scheduled for 04/30/22. She she has felt the mass dissipate during chemotherapy   PREVIOUS RADIATION THERAPY: No  PAST MEDICAL HISTORY:  has a past medical history of Atypical mole (03/14/2019), BRCA gene mutation negative in female, Breast cancer [facility]), Family history of breast cancer, Family history of lung cancer, Family history of rectal cancer, Melanoma ([place]) (10/14/2007), and PONV  (postoperative nausea and vomiting).    PAST SURGICAL HISTORY: Past Surgical History:  Procedure Laterality Date   BREAST RECONSTRUCTION WITH PLACEMENT OF TISSUE EXPANDER AND ALLODERM Bilateral 01/17/2019   Procedure: BILATERAL BREAST RECONSTRUCTION WITH PLACEMENT OF TISSUE EXPANDER AND ALLODERM;  Surgeon: Irene Limbo, MD;  Location: [place];  Service: Plastics;  Laterality: Bilateral;   IR IMAGING GUIDED PORT INSERTION  11/25/2021   IR RADIOLOGIST EVAL & MGMT  11/26/2021   LESION REMOVAL Left 06/02/2019   Procedure: MINOR EXICISION OF LESION,LAYERED CLOSURE 1 CM;  Surgeon: Irene Limbo, MD;  Location: [place];  Service: Plastics;  Laterality: Left;   LIPOSUCTION WITH LIPOFILLING Bilateral 06/02/2019   Procedure: LIPOFILLING FROM ABDOMEN TO BILATERAL CHEST;  Surgeon: Irene Limbo, MD;  Location: [place];  Service: Plastics;  Laterality: Bilateral;   melanoma surgery Right 2009   upper right arm   NIPPLE SPARING MASTECTOMY WITH SENTINEL LYMPH NODE BIOPSY Bilateral 01/17/2019   Procedure: BILATERAL NIPPLE SPARING MASTECTOMIES WITH LEFT SENTINEL LYMPH NODE MAPPING;  Surgeon: Erroll Luna, MD;  Location: [place];  Service: General;  Laterality: Bilateral;   REMOVAL OF BILATERAL TISSUE EXPANDERS WITH PLACEMENT OF BILATERAL BREAST IMPLANTS Bilateral 06/02/2019   Procedure: REMOVAL OF BILATERAL TISSUE EXPANDERS WITH PLACEMENT OF BILATERAL SILICONE BREAST IMPLANTS;  Surgeon: Irene Limbo, MD;  Location: [place];  Service: Plastics;  Laterality: Bilateral;   WISDOM TOOTH EXTRACTION      FAMILY HISTORY: family history includes Breast cancer (age of onset: 69 - 2) in her maternal aunt; Breast cancer (age of onset: 24 - 64) in her maternal aunt, maternal grandmother, and paternal grandmother; Heart disease in her father; Hypertension in her mother; Lung cancer in her paternal aunt, paternal  grandfather, and paternal grandmother; Rectal cancer in her paternal grandmother.  SOCIAL HISTORY:  reports that she has never smoked. She has never used smokeless tobacco. She reports current alcohol use. She reports that she does not use drugs.  ALLERGIES: Oxycodone  MEDICATIONS:  Current Outpatient Medications  Medication Sig Dispense Refill   ALPRAZolam (XANAX) 0.5 MG tablet Take 1 tablet (0.5 mg total) by mouth 3 (three) times daily as needed for anxiety. 20 tablet 0   cetirizine (ZYRTEC) 10 MG tablet Take 10 mg by mouth daily.      doxycycline (VIBRA-TABS) 100 MG tablet Take 1 tablet (100 mg total) by mouth 2 (two) times daily. 30 tablet 2   meclizine (ANTIVERT) 25 MG tablet Take 25 mg by mouth 3 (three) times daily as needed (Vertigo/Motion Sickness).     Multiple Vitamins-Minerals (MULTIVITAMIN WITH MINERALS) tablet Take 1 tablet by mouth daily.     No current facility-administered medications for this encounter.    REVIEW OF SYSTEMS: As above in HPI.   PHYSICAL EXAM:  vitals were not taken for this visit.   General: Alert and oriented, in no acute distress HEENT: Alopecia. Extraocular movements are intact.  Neck: No palpable left supraclavicular lymphadenopathy. Heart: Regular in rate and rhythm with no murmurs, rubs, or gallops. Chest: Clear to auscultation bilaterally, with no rhonchi, wheezes, or rales.  Extremities: No cyanosis or edema. Lymphatics: see Neck Exam Skin: No concerning lesions. Musculoskeletal: symmetric strength and muscle tone  throughout. Neurologic: Cranial nerves II through XII are grossly intact. No obvious focalities. Speech is fluent. Coordination is intact. Psychiatric: Judgment and insight are intact. Affect is appropriate. Breasts: Status post bilateral mastectomies with reconstruction.  There is some mild thickening in the upper outer quadrant of the reconstructed left breast.  No other palpable masses appreciated in the breasts or axillae  bilaterally.   ECOG = 0  0 - Asymptomatic (Fully active, able to carry on all predisease activities without restriction)  1 - Symptomatic but completely ambulatory (Restricted in physically strenuous activity but ambulatory and able to carry out work of a light or sedentary nature. For example, light housework, office work)  2 - Symptomatic, <50% in bed during the day (Ambulatory and capable of all self care but unable to carry out any work activities. Up and about more than 50% of waking hours)  3 - Symptomatic, >50% in bed, but not bedbound (Capable of only limited self-care, confined to bed or chair 50% or more of waking hours)  4 - Bedbound (Completely disabled. Cannot carry on any self-care. Totally confined to bed or chair)  5 - Death   Eustace Pen MM, Creech RH, Tormey DC, et al. [phone]). "Toxicity and response criteria of the [facility]". [place] Oncol. 5 (6): 649-55   LABORATORY DATA:  Lab Results  Component Value Date   WBC 5.6 03/13/2022   HGB 10.1 (L) 03/13/2022   HCT 31.5 (L) 03/13/2022   MCV 90.0 03/13/2022   PLT 196 03/13/2022   CMP     Component Value Date/Time   NA 140 03/13/2022 1210   K 4.1 03/13/2022 1210   CL 106 03/13/2022 1210   CO2 27 03/13/2022 1210   GLUCOSE 105 (H) 03/13/2022 1210   BUN 13 03/13/2022 1210   CREATININE 0.79 03/13/2022 1210   CALCIUM 8.7 (L) 03/13/2022 1210   PROT 6.2 (L) 03/13/2022 1210   ALBUMIN 4.0 03/13/2022 1210   AST 18 03/13/2022 1210   ALT 12 03/13/2022 1210   ALKPHOS 45 03/13/2022 1210   BILITOT 0.4 03/13/2022 1210   GFRNONAA >60 03/13/2022 1210   GFRAA >60 06/04/2019 0201         RADIOGRAPHY: MR BREAST BILATERAL W WO CONTRAST INC CAD  Result Date: 03/20/2022 CLINICAL DATA:  Invasive breast cancer, assess treatment response. History of bilateral nipple sparing mastectomy in January of 2021 for LEFT breast ductal carcinoma in situ. Patient presented with palpable mass in the [place] of the LEFT breast in October 2023. Biopsy showed grade 2 invasive ductal carcinoma with lymphovascular invasion. The patient has undergone neoadjuvant chemotherapy from November 2023 to March of 2024. EXAM: BILATERAL BREAST MRI WITH AND WITHOUT CONTRAST TECHNIQUE: Multiplanar, multisequence MR images of both breasts were obtained prior to and following the intravenous administration of 7.5 ml of Vueway Three-dimensional MR images were rendered by post-processing of the original MR data on an independent workstation. The three-dimensional MR images were interpreted, and findings are reported in the following complete MRI report for this study. Three dimensional images were evaluated at the independent interpreting workstation using the DynaCAD thin client. COMPARISON:  11/19/2021 FINDINGS: Breast composition: c. Heterogeneous fibroglandular tissue. Background parenchymal enhancement: Moderate Right breast: No mass or abnormal enhancement. Retroglandular silicone implant. Left breast: Within the UPPER-OUTER QUADRANT of the LEFT breast, tissue marker clip is identified at the site of previous biopsy. A residual enhancing irregular mass with heterogeneous enhancement measures 1.5 x 0.7 centimeters (image 48  of series 8). Previously, mass measured 3.8 x 2.0 centimeters. There is residual washout type enhancement kinetics. Patient has a retroglandular silicone implant. Lymph nodes: No abnormal appearing lymph nodes. Ancillary findings:  None. IMPRESSION: 1. Significant improvement in the known malignancy in the UPPER-OUTER QUADRANT of the LEFT breast, now measuring 1.5 x 0.7 centimeters (previously 3.8 x 2.0 centimeters.) 2. No new or suspicious findings in either breast. 3. Bilateral retroglandular silicone implants. 4. No adenopathy. RECOMMENDATION: Treatment plan for known LEFT breast malignancy. BI-RADS CATEGORY  6: Known biopsy-proven malignancy. Electronically Signed   By: Nolon Nations M.D.   On:  03/20/2022 16:41  ECHOCARDIOGRAM COMPLETE  Result Date: 03/04/2022    ECHOCARDIOGRAM REPORT   Patient Name:   Keisa RENEE Santino Date of Exam: 03/04/2022 Medical Rec #:  [id number]       Height:       66.0 in Accession #:    [id number]      Weight:       154.7 lb Date of Birth:  [birth year]        BSA:          1.793 m Patient Age:    8 years        BP:           116/78 mmHg Patient Gender: F               HR:           87 bpm. Exam Location:  Outpatient Procedure: 2D Echo, Cardiac Doppler, Color Doppler and Intracardiac            Opacification Agent Indications:    Chemo  History:        Patient has prior history of Echocardiogram examinations, most                 recent 11/25/2021. Family hx of cancer.  Sonographer:    Eartha Inch Referring Phys: [id number] Pena  Sonographer Comments: Technically difficult study due to poor echo windows. Image acquisition challenging due to patient body habitus, Image acquisition challenging due to respiratory motion and Image acquisition challenging due to breast implants. IMPRESSIONS  1. Left ventricular ejection fraction, by estimation, is 60 to 65%. The left ventricle has normal function. The left ventricle has no regional wall motion abnormalities. Left ventricular diastolic parameters were normal.  2. Right ventricular systolic function is normal. The right ventricular size is normal. There is normal pulmonary artery systolic pressure. The estimated right ventricular systolic pressure is [id number] mmHg.  3. The mitral valve is normal in structure. No evidence of mitral valve regurgitation.  4. The aortic valve was not well visualized. Aortic valve regurgitation is not visualized. No aortic stenosis is present.  5. The inferior vena cava is dilated in size with >50% respiratory variability, suggesting right atrial pressure of 8 mmHg. FINDINGS  Left Ventricle: Left ventricular ejection fraction, by estimation, is 60 to 65%. The left ventricle has normal function. The  left ventricle has no regional wall motion abnormalities. Definity contrast agent was given IV to delineate the left ventricular  endocardial borders. The left ventricular internal cavity size was normal in size. There is no left ventricular hypertrophy. Left ventricular diastolic parameters were normal. Right Ventricle: The right ventricular size is normal. No increase in right ventricular wall thickness. Right ventricular systolic function is normal. There is normal pulmonary artery systolic pressure. The tricuspid regurgitant velocity is 1.79 m/s, and  with an assumed right atrial pressure of  8 mmHg, the estimated right ventricular systolic pressure is [id number] mmHg. Left Atrium: Left atrial size was normal in size. Right Atrium: Right atrial size was normal in size. Pericardium: There is no evidence of pericardial effusion. Mitral Valve: The mitral valve is normal in structure. No evidence of mitral valve regurgitation. MV peak gradient, 7.3 mmHg. The mean mitral valve gradient is 3.0 mmHg. Tricuspid Valve: The tricuspid valve is normal in structure. Tricuspid valve regurgitation is trivial. Aortic Valve: The aortic valve was not well visualized. Aortic valve regurgitation is not visualized. No aortic stenosis is present. Aortic valve mean gradient measures 7.0 mmHg. Aortic valve peak gradient measures 10.9 mmHg. Aortic valve area, by VTI measures 2.31 cm. Pulmonic Valve: The pulmonic valve was not well visualized. Pulmonic valve regurgitation is trivial. Aorta: The aortic root and ascending aorta are structurally normal, with no evidence of dilitation. Venous: The inferior vena cava is dilated in size with greater than 50% respiratory variability, suggesting right atrial pressure of 8 mmHg. IAS/Shunts: The interatrial septum was not well visualized.  LEFT VENTRICLE PLAX 2D LVIDd:         4.90 cm   Diastology LVIDs:         3.10 cm   LV e' medial:    9.79 cm/s LV PW:         0.70 cm   LV E/e' medial:  12.3 LV IVS:         0.50 cm   LV e' lateral:   14.80 cm/s LVOT diam:     1.80 cm   LV E/e' lateral: 8.1 LV SV:         77 LV SV Index:   43 LVOT Area:     2.54 cm  RIGHT VENTRICLE             IVC RV S prime:     12.30 cm/s  IVC diam: 2.20 cm TAPSE (M-mode): 1.7 cm LEFT ATRIUM             Index        RIGHT ATRIUM           Index LA diam:        2.70 cm 1.51 cm/m   RA Area:     11.80 cm LA Vol (A2C):   42.5 ml 23.70 ml/m  RA Volume:   28.00 ml  15.62 ml/m LA Vol (A4C):   39.8 ml 22.20 ml/m LA Biplane Vol: 43.2 ml 24.09 ml/m  AORTIC VALVE AV Area (Vmax):    2.33 cm AV Area (Vmean):   2.21 cm AV Area (VTI):     2.31 cm AV Vmax:           165.00 cm/s AV Vmean:          121.000 cm/s AV VTI:            0.334 m AV Peak Grad:      10.9 mmHg AV Mean Grad:      7.0 mmHg LVOT Vmax:         151.00 cm/s LVOT Vmean:        105.000 cm/s LVOT VTI:          0.303 m LVOT/AV VTI ratio: 0.91  AORTA Ao Root diam: 2.40 cm Ao Asc diam:  2.80 cm MITRAL VALVE                TRICUSPID VALVE MV Area (PHT): 3.36 cm     TR Peak grad:  12.8 mmHg MV Area VTI:   1.97 cm     TR Vmax:        179.00 cm/s MV Peak grad:  7.3 mmHg MV Mean grad:  3.0 mmHg     SHUNTS MV Vmax:       1.35 m/s     Systemic VTI:  0.30 m MV Vmean:      77.4 cm/s    Systemic Diam: 1.80 cm MV Decel Time: 226 msec MV E velocity: 120.00 cm/s MV A velocity: 63.50 cm/s MV E/A ratio:  1.89 Oswaldo Milian MD Electronically signed by Oswaldo Milian MD Signature Date/Time: 03/04/2022/9:30:37 AM    Final       IMPRESSION/PLAN: This is a very pleasant 37 year old woman who I previously saw in consultation when she was diagnosed with breast cancer in 2020.  She underwent bilateral mastectomies with clear margins and had T76mic node-negative disease.  Therefore, she did not receive postoperative radiation.  Unfortunately she developed a recurrence in the left reconstructed breast and has completed neoadjuvant chemotherapy for that.  She anticipates surgical resection and sentinel  lymph node biopsy in [place].  She has been discussed at our multidisciplinary conference.  I had a lengthy discussion with the patient and her husband today.  We discussed the risks, benefits, and side effects of radiotherapy. I recommend radiotherapy to the left reconstructed breast +/- nodes to reduce her risk of locoregional recurrence by 2/3.  We discussed that radiation would take approximately 6-6.5 weeks to complete and that I would give the patient at least few weeks to heal following surgery before starting treatment planning.  She understands that the timing of radiation will depend upon whether her margins are cleared at the time of surgery and depend upon how she heals from surgery.  She has some trips planned for the summer and we talked about possible scheduling  - she is going to try to avoid locking in travel plans until she understands her upcoming radiation calendar.  I reviewed the logistics, benefits, risks, and potential side effects of this treatment in detail.  No guarantees of treatment were given.  Risks may include but not necessary be limited to acute and late injury tissue in the radiation fields such as skin irritation (change in color/pigmentation, itching, dryness, pain, peeling). She may experience fatigue. We also discussed possible risk of long term cosmetic changes or scar tissue. There is also a smaller risk for lung toxicity, cardiac toxicity, brachial plexopathy, lymphedema, musculoskeletal changes, rib fragility or induction of a second malignancy, late chronic non-healing soft tissue wound.  We specifically talked about risks in the context of breast reconstruction.  We talked in more detail about capsular contracture, fibrosis, scar tissue, nonhealing wounds, discomfort that can result from radiation.  She understands some patients elect to or need to have the implant removed if they have significant side effects from radiation therapy.  All that being said, she  understands that radiation therapy will improve her chance of cure and her life expectancy and she wants to prioritize these goals over optimizing long-term reconstructive and cosmetic results.  The patient asked good questions which I answered to her satisfaction. She is enthusiastic about proceeding with treatment. A consent form has been  signed and placed in her chart.  I look forward to seeing her back in follow-up when she is released by Dr. Brantley Stage for radiation planning.  On date of service, in total, I spent 60 minutes on this encounter. Patient was seen in  person.   __________________________________________   Eppie Gibson, MD  This document serves as a record of services personally performed by Eppie Gibson, MD. It was created on her behalf by Roney Mans, a trained medical scribe. The creation of this record is based on the scribe's personal observations and the provider's statements to them. This document has been checked and approved by the attending provider.

## 2022-04-01 ENCOUNTER — Ambulatory Visit
Admission: RE | Admit: 2022-04-01 | Discharge: 2022-04-01 | Disposition: A | Discharge status: Home / Self Care | Payer: BC Managed Care – PPO | Source: Ambulatory Visit | Attending: Radiation Oncology | Admitting: Radiation Oncology

## 2022-04-01 VITALS — BP 113/76 | HR 82 | Temp 97.5°F | Resp 18 | Ht 66.0 in | Wt 158.2 lb

## 2022-04-01 DIAGNOSIS — Z85820 Personal history of malignant melanoma of skin: Secondary | ICD-10-CM | POA: Diagnosis not present

## 2022-04-01 DIAGNOSIS — Z170 Estrogen receptor positive status [ER+]: Secondary | ICD-10-CM | POA: Diagnosis not present

## 2022-04-01 DIAGNOSIS — Z79899 Other long term (current) drug therapy: Secondary | ICD-10-CM | POA: Diagnosis not present

## 2022-04-01 DIAGNOSIS — Z803 Family history of malignant neoplasm of breast: Secondary | ICD-10-CM | POA: Diagnosis not present

## 2022-04-01 DIAGNOSIS — I371 Nonrheumatic pulmonary valve insufficiency: Secondary | ICD-10-CM | POA: Insufficient documentation

## 2022-04-01 DIAGNOSIS — Z801 Family history of malignant neoplasm of trachea, bronchus and lung: Secondary | ICD-10-CM | POA: Insufficient documentation

## 2022-04-01 DIAGNOSIS — C50412 Malignant neoplasm of upper-outer quadrant of left female breast: Secondary | ICD-10-CM

## 2022-04-03 ENCOUNTER — Encounter: Payer: Self-pay | Admitting: Adult Health

## 2022-04-03 ENCOUNTER — Inpatient Hospital Stay (HOSPITAL_BASED_OUTPATIENT_CLINIC_OR_DEPARTMENT_OTHER): Payer: BC Managed Care – PPO | Admitting: Adult Health

## 2022-04-03 ENCOUNTER — Inpatient Hospital Stay: Payer: BC Managed Care – PPO

## 2022-04-03 VITALS — BP 106/69 | HR 74 | Resp 14

## 2022-04-03 DIAGNOSIS — C50412 Malignant neoplasm of upper-outer quadrant of left female breast: Secondary | ICD-10-CM | POA: Diagnosis not present

## 2022-04-03 DIAGNOSIS — Z170 Estrogen receptor positive status [ER+]: Secondary | ICD-10-CM | POA: Diagnosis not present

## 2022-04-03 MED ORDER — DIPHENHYDRAMINE HCL 25 MG PO CAPS
50.0000 mg | ORAL_CAPSULE | Freq: Once | ORAL | Status: AC
  Administered 2022-04-03: 50 mg via ORAL
  Filled 2022-04-03: qty 2

## 2022-04-03 MED ORDER — SODIUM CHLORIDE 0.9% FLUSH
10.0000 mL | INTRAVENOUS | Status: DC | PRN
  Administered 2022-04-03: 10 mL

## 2022-04-03 MED ORDER — SODIUM CHLORIDE 0.9 % IV SOLN
420.0000 mg | Freq: Once | INTRAVENOUS | Status: AC
  Administered 2022-04-03: 420 mg via INTRAVENOUS
  Filled 2022-04-03: qty 14

## 2022-04-03 MED ORDER — HEPARIN SOD (PORK) LOCK FLUSH 100 UNIT/ML IV SOLN
500.0000 [IU] | Freq: Once | INTRAVENOUS | Status: AC | PRN
  Administered 2022-04-03: 500 [IU]

## 2022-04-03 MED ORDER — TRASTUZUMAB-ANNS CHEMO 150 MG IV SOLR
6.0000 mg/kg | Freq: Once | INTRAVENOUS | Status: AC
  Administered 2022-04-03: 420 mg via INTRAVENOUS
  Filled 2022-04-03: qty 20

## 2022-04-03 MED ORDER — SODIUM CHLORIDE 0.9 % IV SOLN: Freq: Once | INTRAVENOUS | Status: AC

## 2022-04-03 MED ORDER — ACETAMINOPHEN 325 MG PO TABS
650.0000 mg | ORAL_TABLET | Freq: Once | ORAL | Status: AC
  Administered 2022-04-03: 650 mg via ORAL
  Filled 2022-04-03: qty 2

## 2022-04-03 NOTE — Assessment & Plan Note (Signed)
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  She is status post neoadjuvant chemotherapy and is scheduled for surgery to occur on Kirandeep 18, 2024.  Until this time she will undergo Herceptin Perjeta every 3 weeks.  Her most recent echo was normal in February and she will proceed with treatment today.  Because of her leg discomfort I recommended magnesium and B12 for her to take.  I recommended continued hydration which she assures me she is doing as she has a 50 ounce water bottle with her today.  She will return in 3 weeks for follow-up with Dr. Chryl Heck and her next Herceptin Perjeta.

## 2022-04-03 NOTE — Progress Notes (Signed)
[facility] Follow up:    Stephanie Pike, MD [place] [address]   DIAGNOSIS:  Cancer Staging  Malignant neoplasm of upper-outer quadrant of left breast in female, estrogen receptor positive ([place]) Staging form: Breast, AJCC 8th Edition - Clinical: Stage IA (cT1c, cN0, cM0, G2, ER+, PR-, HER2+) - Signed by Stephanie Pike, MD on 11/18/2021 Histologic grading system: 3 grade system   SUMMARY OF ONCOLOGIC HISTORY: Oncology History  Malignant neoplasm of upper-outer quadrant of female breast ([address]) (Resolved)  10/20/2018 Initial Diagnosis   Malignant neoplasm of upper-outer quadrant of left breast in female, estrogen receptor positive ([place])   10/20/2018 Cancer Staging   Staging form: Breast, AJCC 8th Edition - Clinical stage from 10/20/2018: Stage 0 (cTis (DCIS), cN0, cM0, ER+, PR-) - Signed by Chauncey Cruel, MD on 02/13/2020 Stage prefix: Initial diagnosis   11/03/2018 Genetic Testing   Negative genetic testing:  No pathogenic variants detected on the [facility] Multi-Cancer panel, ordered by Dr. Benjie Karvonen at [facility]. The report date is 11/03/2018.  The Multi-Cancer Panel offered by [facility] includes sequencing and/or deletion duplication testing of the following 84 genes: AIP, ALK, APC, ATM, AXIN2,BAP1,  BARD1, BLM, BMPR1A, BRCA1, BRCA2, BRIP1, CASR, CDC73, CDH1, CDK4, CDKN1B, CDKN1C, CDKN2A (p14ARF), CDKN2A (p16INK4a), CEBPA, CHEK2, CTNNA1, DICER1, DIS3L2, EGFR (c.2369C>T, p.Thr790Met variant only), EPCAM (Deletion/duplication testing only), FH, FLCN, GATA2, GPC3, GREM1 (Promoter region deletion/duplication testing only), HOXB13 (c.251G>A, p.Gly84Glu), HRAS, KIT, MAX, MEN1, MET, MITF (c.952G>A, p.Glu318Lys variant only), MLH1, MSH2, MSH3, MSH6, MUTYH, NBN, NF1, NF2, NTHL1, PALB2, PDGFRA, PHOX2B, PMS2, POLD1, POLE, POT1, PRKAR1A, PTCH1, PTEN, RAD50, RAD51C, RAD51D, RB1, RECQL4, RET, RUNX1, SDHAF2, SDHA (sequence changes only), SDHB, SDHC,  SDHD, SMAD4, SMARCA4, SMARCB1, SMARCE1, STK11, SUFU, TERC, TERT, TMEM127, TP53, TSC1, TSC2, VHL, WRN and WT1.     01/17/2019 Cancer Staging   Staging form: Breast, AJCC 8th Edition - Pathologic stage from 01/17/2019: Stage IA (pT65mi, pN0, cM0, G2, ER+, PR-, HER2-) - Signed by Gardenia Phlegm, NP on 02/01/2019   Malignant neoplasm of upper-outer quadrant of left breast in female, estrogen receptor positive ([place])  10/21/2021 Breast US   Breast ultrasound of the palpable mass showed hypoechoic oval vascular mass with indistinct margins measuring 1.5 x 1 cm x 1.4 cm.  No abnormal lymph nodes found in the left axilla.  She had ultrasound-guided biopsy.  MRI scheduled for tomorrow.   10/29/2021 Pathology Results   Surgical pathology from the left breast needle core biopsy at 130 o'clock showed invasive ductal carcinoma, overall grade 2, prognostics ER 70% positive weak to moderate staining, PR 0% negative, Ki-67 of 80% and HER2 3+   11/18/2021 Initial Diagnosis   Malignant neoplasm of upper-outer quadrant of left breast in female, estrogen receptor positive ([place])   11/18/2021 Cancer Staging   Staging form: Breast, AJCC 8th Edition - Clinical: Stage IA (cT1c, cN0, cM0, G2, ER+, PR-, HER2+) - Signed by Stephanie Pike, MD on 11/18/2021 Histologic grading system: 3 grade system   11/26/2021 - 03/16/2022 Chemotherapy   Patient is on Treatment Plan : BREAST  Docetaxel + Carboplatin + Trastuzumab + Pertuzumab  (TCHP) q21d      04/03/2022 -  Chemotherapy   Patient is on Treatment Plan : BREAST Trastuzumab + Pertuzumab q21d x 11 cycles       CURRENT THERAPY: Herceptin Perjeta  INTERVAL HISTORY: Stephanie [name] 37 y.o. female returns for follow-up and evaluation prior to receiving Herceptin Perjeta therapy.  She is  tolerating treatment well and is planning for surgery which is scheduled on Nuala 18, 2024.  He is having some achiness and tightness in the muscles in her legs and wants to know what she  can do about this.  Her most recent echocardiogram occurred on 03/04/2022 and demonstrated LVEF of 60-65%.     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  MEDICAL HISTORY: Past Medical History:  Diagnosis Date   Atypical mole 03/14/2019   mod-left side superior   BRCA gene mutation negative in female    Breast cancer [facility])    Family history of breast cancer    Family history of lung cancer    Family history of rectal cancer    Melanoma ([place]) 10/14/2007   level II- right arm- ([facility])   PONV (postoperative nausea and vomiting)     SURGICAL HISTORY: Past Surgical History:  Procedure Laterality Date   BREAST RECONSTRUCTION WITH PLACEMENT OF TISSUE EXPANDER AND ALLODERM Bilateral 01/17/2019   Procedure: BILATERAL BREAST RECONSTRUCTION WITH PLACEMENT OF TISSUE EXPANDER AND ALLODERM;  Surgeon: Irene Limbo, MD;  Location: [facility];  Service: Plastics;  Laterality: Bilateral;   IR IMAGING GUIDED PORT INSERTION  11/25/2021   IR RADIOLOGIST EVAL & MGMT  11/26/2021   LESION REMOVAL Left 06/02/2019   Procedure: MINOR EXICISION OF LESION,LAYERED CLOSURE 1 CM;  Surgeon: Irene Limbo, MD;  Location: [facility];  Service: Plastics;  Laterality: Left;   LIPOSUCTION WITH LIPOFILLING Bilateral 06/02/2019   Procedure: LIPOFILLING FROM ABDOMEN TO BILATERAL CHEST;  Surgeon: Irene Limbo, MD;  Location:  [facility];  Service: Plastics;  Laterality: Bilateral;   melanoma surgery Right 2009   upper right arm   NIPPLE SPARING MASTECTOMY WITH SENTINEL LYMPH NODE BIOPSY Bilateral 01/17/2019   Procedure: BILATERAL NIPPLE SPARING MASTECTOMIES WITH LEFT SENTINEL LYMPH NODE MAPPING;  Surgeon: Erroll Luna, MD;  Location: [facility];  Service: General;  Laterality: Bilateral;   REMOVAL OF BILATERAL TISSUE EXPANDERS WITH PLACEMENT OF BILATERAL BREAST IMPLANTS Bilateral 06/02/2019   Procedure: REMOVAL OF BILATERAL TISSUE EXPANDERS WITH PLACEMENT OF BILATERAL SILICONE BREAST IMPLANTS;  Surgeon: Irene Limbo, MD;  Location: [facility];  Service: Plastics;  Laterality: Bilateral;   WISDOM TOOTH EXTRACTION      SOCIAL HISTORY: Social History   Socioeconomic History   Marital status: Married    Spouse name: Not on file   Number of children: Not on file   Years of education: Not on file   Highest education level: Not on file  Occupational History   Not on file  Tobacco Use   Smoking status: Never   Smokeless tobacco: Never  Vaping Use   Vaping Use: Never used  Substance and Sexual Activity   Alcohol use: Yes    Comment: occasional   Drug use: Never   Sexual activity: Yes  Birth control/protection: None  Other Topics Concern   Not on file  Social History Narrative   Not on file   Social Determinants of Health   Financial Resource Strain: Not on file  Food Insecurity: Not on file  Transportation Needs: No Transportation Needs (11/09/2018)   PRAPARE - Hydrologist (Medical): No    Lack of Transportation (Non-Medical): No  Physical Activity: Not on file  Stress: Not on file  Social Connections: Not on file  Intimate Partner Violence: Not At Risk (11/09/2018)   Humiliation, Afraid, Rape, and Kick questionnaire    Fear of Current or Ex-Partner: No    Emotionally Abused: No    Physically Abused: No    Sexually  Abused: No    FAMILY HISTORY: Family History  Problem Relation Age of Onset   Hypertension Mother    Heart disease Father    Breast cancer Maternal Aunt 60 - 69   Breast cancer Maternal Aunt 60 - 59   Lung cancer Paternal Aunt        diagnosed late 19s   Breast cancer Maternal Grandmother 60 - 69   Breast cancer Paternal Grandmother 61 - 73   Lung cancer Paternal Grandmother        thought to be breast cancer mets   Rectal cancer Paternal Grandmother        diagnosed 7s   Lung cancer Paternal Grandfather        diagnosed 21s    Review of Systems  Constitutional:  Positive for fatigue. Negative for appetite change, chills, fever and unexpected weight change.  HENT:   Negative for hearing loss, lump/mass and trouble swallowing.   Eyes:  Negative for eye problems and icterus.  Respiratory:  Negative for chest tightness, cough and shortness of breath.   Cardiovascular:  Negative for chest pain, leg swelling and palpitations.  Gastrointestinal:  Negative for abdominal distention, abdominal pain, constipation, diarrhea, nausea and vomiting.  Endocrine: Negative for hot flashes.  Genitourinary:  Negative for difficulty urinating.   Musculoskeletal:  Negative for arthralgias.  Skin:  Negative for itching and rash.  Neurological:  Negative for dizziness, extremity weakness, headaches and numbness.  Hematological:  Negative for adenopathy. Does not bruise/bleed easily.  Psychiatric/Behavioral:  Negative for depression. The patient is not nervous/anxious.        PHYSICAL EXAMINATION  ECOG PERFORMANCE STATUS: 1 - Symptomatic but completely ambulatory  Vitals:   04/03/22 1223  BP: 117/67  Pulse: 78  Resp: 16  Temp: 97.9 F (36.6 C)  SpO2: 100%    Physical Exam Constitutional:      General: She is not in acute distress.    Appearance: Normal appearance. She is not toxic-appearing.  HENT:     Head: Normocephalic and atraumatic.  Eyes:     General: No scleral  icterus. Cardiovascular:     Rate and Rhythm: Normal rate and regular rhythm.     Pulses: Normal pulses.     Heart sounds: Normal heart sounds.  Pulmonary:     Effort: Pulmonary effort is normal.     Breath sounds: Normal breath sounds.  Abdominal:     General: Abdomen is flat. Bowel sounds are normal. There is no distension.     Palpations: Abdomen is soft.     Tenderness: There is no abdominal tenderness.  Musculoskeletal:        General: No swelling.     Cervical back: Neck supple.  Lymphadenopathy:  Cervical: No cervical adenopathy.  Skin:    General: Skin is warm and dry.     Findings: No rash.  Neurological:     General: No focal deficit present.     Mental Status: She is alert.  Psychiatric:        Mood and Affect: Mood normal.        Behavior: Behavior normal.     LABORATORY DATA:  CBC    Component Value Date/Time   WBC 5.6 03/13/2022 1210   WBC 7.7 02/17/2022 0830   RBC 3.50 (L) 03/13/2022 1210   HGB 10.1 (L) 03/13/2022 1210   HCT 31.5 (L) 03/13/2022 1210   PLT 196 03/13/2022 1210   MCV 90.0 03/13/2022 1210   MCH 28.9 03/13/2022 1210   MCHC 32.1 03/13/2022 1210   RDW 18.7 (H) 03/13/2022 1210   LYMPHSABS 1.4 03/13/2022 1210   MONOABS 0.5 03/13/2022 1210   EOSABS 0.0 03/13/2022 1210   BASOSABS 0.0 03/13/2022 1210    CMP     Component Value Date/Time   NA 140 03/13/2022 1210   K 4.1 03/13/2022 1210   CL 106 03/13/2022 1210   CO2 27 03/13/2022 1210   GLUCOSE 105 (H) 03/13/2022 1210   BUN 13 03/13/2022 1210   CREATININE 0.79 03/13/2022 1210   CALCIUM 8.7 (L) 03/13/2022 1210   PROT 6.2 (L) 03/13/2022 1210   ALBUMIN 4.0 03/13/2022 1210   AST 18 03/13/2022 1210   ALT 12 03/13/2022 1210   ALKPHOS 45 03/13/2022 1210   BILITOT 0.4 03/13/2022 1210   GFRNONAA >60 03/13/2022 1210   GFRAA >60 06/04/2019 0201       PENDING LABS:   RADIOGRAPHIC STUDIES:  No results found.   PATHOLOGY:     ASSESSMENT and THERAPY PLAN:   Malignant  neoplasm of upper-outer quadrant of left breast in female, estrogen receptor positive ([place]) Stephanie [name] is a 37 year old woman with recurrent left-sided stage Ia ER HER2 positive breast cancer diagnosed in October 2023.  She is status post neoadjuvant chemotherapy and is scheduled for surgery to occur on Sanna 18, 2024.  Until this time she will undergo Herceptin Perjeta every 3 weeks.  Her most recent echo was normal in February and she will proceed with treatment today.  Because of her leg discomfort I recommended magnesium and B12 for her to take.  I recommended continued hydration which she assures me she is doing as she has a 50 ounce water bottle with her today.  She will return in 3 weeks for follow-up with Dr. Chryl Heck and her next Herceptin Perjeta.   All questions were answered. The patient knows to call the clinic with any problems, questions or concerns. We can certainly see the patient much sooner if necessary.  Total encounter time:20 minutes*in face-to-face visit time, chart review, lab review, care coordination, order entry, and documentation of the encounter time.    Wilber Bihari, NP 04/03/22 3:36 PM Medical Oncology and Hematology [facility] [place],  [address] Tel. [phone]    Fax. [phone]  *Total Encounter Time as defined by the Centers for Medicare and Medicaid Services includes, in addition to the face-to-face time of a patient visit (documented in the note above) non-face-to-face time: obtaining and reviewing outside history, ordering and reviewing medications, tests or procedures, care coordination (communications with other health care professionals or caregivers) and documentation in the medical record.

## 2022-04-03 NOTE — Patient Instructions (Signed)
[facility]  Discharge Instructions: Thank you for choosing [facility] to provide your oncology and hematology care.   If you have a lab appointment with the [facility], please go directly to the [facility] and check in at the registration area.   Wear comfortable clothing and clothing appropriate for easy access to any Portacath or PICC line.   We strive to give you quality time with your provider. You may need to reschedule your appointment if you arrive late (15 or more minutes).  Arriving late affects you and other patients whose appointments are after yours.  Also, if you miss three or more appointments without notifying the office, you may be dismissed from the clinic at the provider's discretion.      For prescription refill requests, have your pharmacy contact our office and allow 72 hours for refills to be completed.    Today you received the following chemotherapy and/or immunotherapy agents: Kanjinti/Perjeta   To help prevent nausea and vomiting after your treatment, we encourage you to take your nausea medication as directed.  BELOW ARE SYMPTOMS THAT SHOULD BE REPORTED IMMEDIATELY: *FEVER GREATER THAN 100.4 F (38 C) OR HIGHER *CHILLS OR SWEATING *NAUSEA AND VOMITING THAT IS NOT CONTROLLED WITH YOUR NAUSEA MEDICATION *UNUSUAL SHORTNESS OF BREATH *UNUSUAL BRUISING OR BLEEDING *URINARY PROBLEMS (pain or burning when urinating, or frequent urination) *BOWEL PROBLEMS (unusual diarrhea, constipation, pain near the anus) TENDERNESS IN MOUTH AND THROAT WITH OR WITHOUT PRESENCE OF ULCERS (sore throat, sores in mouth, or a toothache) UNUSUAL RASH, SWELLING OR PAIN  UNUSUAL VAGINAL DISCHARGE OR ITCHING   Items with * indicate a potential emergency and should be followed up as soon as possible or go to the Emergency Department if any problems should occur.  Please show the CHEMOTHERAPY ALERT CARD or IMMUNOTHERAPY ALERT CARD at  check-in to the Emergency Department and triage nurse.  Should you have questions after your visit or need to cancel or reschedule your appointment, please contact [facility]  Dept: [phone]  and follow the prompts.  Office hours are 8:00 a.m. to 4:30 p.m. Monday - Friday. Please note that voicemails left after 4:00 p.m. may not be returned until the following business day.  We are closed weekends and major holidays. You have access to a nurse at all times for urgent questions. Please call the main number to the clinic Dept: [phone] and follow the prompts.   For any non-urgent questions, you may also contact your provider using MyChart. We now offer e-Visits for anyone 32 and older to request care online for non-urgent symptoms. For details visit [email or web address].   Also download the MyChart app! Go to the app store, search "MyChart", open the app, select [facility], and log in with your MyChart username and password.

## 2022-04-14 ENCOUNTER — Other Ambulatory Visit: Payer: Self-pay

## 2022-04-22 ENCOUNTER — Inpatient Hospital Stay: Payer: BC Managed Care – PPO

## 2022-04-22 ENCOUNTER — Other Ambulatory Visit: Payer: Self-pay

## 2022-04-22 ENCOUNTER — Encounter (HOSPITAL_BASED_OUTPATIENT_CLINIC_OR_DEPARTMENT_OTHER): Payer: Self-pay | Admitting: Surgery

## 2022-04-22 ENCOUNTER — Inpatient Hospital Stay: Payer: BC Managed Care – PPO | Attending: Hematology and Oncology | Admitting: Hematology and Oncology

## 2022-04-22 DIAGNOSIS — Z170 Estrogen receptor positive status [ER+]: Secondary | ICD-10-CM

## 2022-04-22 DIAGNOSIS — Z803 Family history of malignant neoplasm of breast: Secondary | ICD-10-CM | POA: Insufficient documentation

## 2022-04-22 DIAGNOSIS — Z800 Family history of malignant neoplasm of digestive organs: Secondary | ICD-10-CM | POA: Insufficient documentation

## 2022-04-22 DIAGNOSIS — M791 Myalgia, unspecified site: Secondary | ICD-10-CM | POA: Insufficient documentation

## 2022-04-22 DIAGNOSIS — Z801 Family history of malignant neoplasm of trachea, bronchus and lung: Secondary | ICD-10-CM | POA: Diagnosis not present

## 2022-04-22 DIAGNOSIS — C50412 Malignant neoplasm of upper-outer quadrant of left female breast: Secondary | ICD-10-CM | POA: Diagnosis present

## 2022-04-22 DIAGNOSIS — Z95828 Presence of other vascular implants and grafts: Secondary | ICD-10-CM

## 2022-04-22 LAB — CBC WITH DIFFERENTIAL/PLATELET
Abs Immature Granulocytes: 0.01 10*3/uL (ref 0.00–0.07)
Basophils Absolute: 0 10*3/uL (ref 0.0–0.1)
Basophils Relative: 1 %
Eosinophils Absolute: 0.1 10*3/uL (ref 0.0–0.5)
Eosinophils Relative: 2 %
HCT: 36 % (ref 36.0–46.0)
Hemoglobin: 11.6 g/dL — ABNORMAL LOW (ref 12.0–15.0)
Immature Granulocytes: 0 %
Lymphocytes Relative: 31 %
Lymphs Abs: 1.7 10*3/uL (ref 0.7–4.0)
MCH: 29.2 pg (ref 26.0–34.0)
MCHC: 32.2 g/dL (ref 30.0–36.0)
MCV: 90.7 fL (ref 80.0–100.0)
Monocytes Absolute: 0.4 10*3/uL (ref 0.1–1.0)
Monocytes Relative: 6 %
Neutro Abs: 3.3 10*3/uL (ref 1.7–7.7)
Neutrophils Relative %: 60 %
Platelets: 208 10*3/uL (ref 150–400)
RBC: 3.97 MIL/uL (ref 3.87–5.11)
RDW: 13.2 % (ref 11.5–15.5)
WBC: 5.4 10*3/uL (ref 4.0–10.5)
nRBC: 0 % (ref 0.0–0.2)

## 2022-04-22 LAB — COMPREHENSIVE METABOLIC PANEL
ALT: 8 U/L (ref 0–44)
AST: 16 U/L (ref 15–41)
Albumin: 4.3 g/dL (ref 3.5–5.0)
Alkaline Phosphatase: 54 U/L (ref 38–126)
Anion gap: 4 — ABNORMAL LOW (ref 5–15)
BUN: 13 mg/dL (ref 6–20)
CO2: 30 mmol/L (ref 22–32)
Calcium: 9.6 mg/dL (ref 8.9–10.3)
Chloride: 106 mmol/L (ref 98–111)
Creatinine, Ser: 0.77 mg/dL (ref 0.44–1.00)
GFR, Estimated: >60 mL/min (ref >60)
Glucose, Bld: 98 mg/dL (ref 70–99)
Potassium: 3.7 mmol/L (ref 3.5–5.1)
Sodium: 140 mmol/L (ref 135–145)
Total Bilirubin: 0.5 mg/dL (ref 0.3–1.2)
Total Protein: 6.7 g/dL (ref 6.5–8.1)

## 2022-04-22 LAB — MAGNESIUM: Magnesium: 2 mg/dL (ref 1.7–2.4)

## 2022-04-22 MED ORDER — SODIUM CHLORIDE 0.9% FLUSH
10.0000 mL | INTRAVENOUS | Status: AC | PRN
  Administered 2022-04-22: 10 mL

## 2022-04-22 NOTE — Progress Notes (Signed)
Stephanie [name]  Patient Care Team: Rachel Moulds, MD as PCP - General (Hematology and Oncology) [name], MD as Consulting Physician (General Surgery) Glenna Fellows, MD as Consulting Physician (Plastic Surgery) Lonie Peak, MD as Consulting Physician (Radiation Oncology) Donnelly Angelica, RN as Oncology Nurse Navigator Pershing Proud, RN as Oncology Nurse Navigator Shea Evans, MD as Consulting Physician (Obstetrics and Gynecology) Janalyn Harder, MD (Inactive) as Consulting Physician (Dermatology) Shea Evans, MD as Consulting Physician (Obstetrics and Gynecology) Glyn Ade, PA-C as Physician Assistant (Dermatology) Rachel Moulds, MD as Consulting Physician (Hematology and Oncology)  CHIEF COMPLAINTS/PURPOSE OF CONSULTATION:  Breast cancer follow up  SUMMARY OF ONCOLOGIC HISTORY: Oncology History  Malignant neoplasm of upper-outer quadrant of female breast (Resolved)  10/20/2018 Initial Diagnosis   Malignant neoplasm of upper-outer quadrant of left breast in female, estrogen receptor positive (HCC)   10/20/2018 Cancer Staging   Staging form: Breast, AJCC 8th Edition - Clinical stage from 10/20/2018: Stage 0 (cTis (DCIS), cN0, cM0, ER+, PR-) - Signed by Lowella Dell, MD on 02/13/2020 Stage prefix: Initial diagnosis   11/03/2018 Genetic Testing   Negative genetic testing:  No pathogenic variants detected on the [facility] Multi-Cancer panel, ordered by Dr. Juliene Pina at [facility]. The report date is 11/03/2018.  The Multi-Cancer Panel offered by [facility] includes sequencing and/or deletion duplication testing of the following 84 genes: AIP, ALK, APC, ATM, AXIN2,BAP1,  BARD1, BLM, BMPR1A, BRCA1, BRCA2, BRIP1, CASR, CDC73, CDH1, CDK4, CDKN1B, CDKN1C, CDKN2A (p14ARF), CDKN2A (p16INK4a), CEBPA, CHEK2, CTNNA1, DICER1, DIS3L2, EGFR (c.2369C>T, p.Thr790Met variant only), EPCAM (Deletion/duplication testing only), FH, FLCN,  GATA2, GPC3, GREM1 (Promoter region deletion/duplication testing only), HOXB13 (c.251G>A, p.Gly84Glu), HRAS, KIT, MAX, MEN1, MET, MITF (c.952G>A, p.Glu318Lys variant only), MLH1, MSH2, MSH3, MSH6, MUTYH, NBN, NF1, NF2, NTHL1, PALB2, PDGFRA, PHOX2B, PMS2, POLD1, POLE, POT1, PRKAR1A, PTCH1, PTEN, RAD50, RAD51C, RAD51D, RB1, RECQL4, RET, RUNX1, SDHAF2, SDHA (sequence changes only), SDHB, SDHC, SDHD, SMAD4, SMARCA4, SMARCB1, SMARCE1, STK11, SUFU, TERC, TERT, TMEM127, TP53, TSC1, TSC2, VHL, WRN and WT1.     01/17/2019 Cancer Staging   Staging form: Breast, AJCC 8th Edition - Pathologic stage from 01/17/2019: Stage IA (pT81mi, pN0, cM0, G2, ER+, PR-, HER2-) - Signed by Loa Socks, NP on 02/01/2019   Malignant neoplasm of upper-outer quadrant of left breast in female, estrogen receptor positive  10/21/2021 Breast US   Breast ultrasound of the palpable mass showed hypoechoic oval vascular mass with indistinct margins measuring 1.5 x 1 cm x 1.4 cm.  No abnormal lymph nodes found in the left axilla.  She had ultrasound-guided biopsy.  MRI scheduled for tomorrow.   10/29/2021 Pathology Results   Surgical pathology from the left breast needle core biopsy at 130 o'clock showed invasive ductal carcinoma, overall grade 2, prognostics ER 70% positive weak to moderate staining, PR 0% negative, Ki-67 of 80% and HER2 3+   11/18/2021 Initial Diagnosis   Malignant neoplasm of upper-outer quadrant of left breast in female, estrogen receptor positive (HCC)   11/18/2021 Cancer Staging   Staging form: Breast, AJCC 8th Edition - Clinical: Stage IA (cT1c, cN0, cM0, G2, ER+, PR-, HER2+) - Signed by Rachel Moulds, MD on 11/18/2021 Histologic grading system: 3 grade system   11/26/2021 - 03/16/2022 Chemotherapy   Patient is on Treatment Plan : BREAST  Docetaxel + Carboplatin + Trastuzumab + Pertuzumab  (TCHP) q21d      04/03/2022 -  Chemotherapy   Patient is on Treatment Plan : BREAST Trastuzumab +  Pertuzumab q21d x  11 cycles      INTERIM HISTORY:  Stephanie [name] is a 37 y.o. female who returns for a follow up. She completed 6 cycles of TCHP and is now on herceptin and perjeta maintenance.  She has her surgery scheduled on [name], 2024. She continues to have some myalgias like she did a workout mainly in her legs. She rarely feels it in hands.  She thinks its a muscle pain. No OTC medications or statin use. She wonders if this could be from Mg deficiency vs herceptin/perjeta. She started taking mg supplements and has noticed some change in the past 4 days. Rest of the ROS was reviewed and negative.   MEDICAL HISTORY:  Past Medical History:  Diagnosis Date   Atypical mole 03/14/2019   mod-left side superior   BRCA gene mutation negative in female    Breast cancer [facility])    Family history of breast cancer    Family history of lung cancer    Family history of rectal cancer    Melanoma (HCC) 10/14/2007   level II- right arm- (EXC)   PONV (postoperative nausea and vomiting)     SURGICAL HISTORY: Past Surgical History:  Procedure Laterality Date   BREAST RECONSTRUCTION WITH PLACEMENT OF TISSUE EXPANDER AND ALLODERM Bilateral 01/17/2019   Procedure: BILATERAL BREAST RECONSTRUCTION WITH PLACEMENT OF TISSUE EXPANDER AND ALLODERM;  Surgeon: Glenna Fellows, MD;  Location: [facility];  Service: Plastics;  Laterality: Bilateral;   IR IMAGING GUIDED PORT INSERTION  11/25/2021   IR RADIOLOGIST EVAL & MGMT  11/26/2021   LESION REMOVAL Left 06/02/2019   Procedure: MINOR EXICISION OF LESION,LAYERED CLOSURE 1 CM;  Surgeon: Glenna Fellows, MD;  Location: [facility];  Service: Plastics;  Laterality: Left;   LIPOSUCTION WITH LIPOFILLING Bilateral 06/02/2019   Procedure: LIPOFILLING FROM ABDOMEN TO BILATERAL CHEST;  Surgeon: Glenna Fellows, MD;  Location: [facility];  Service: Plastics;  Laterality: Bilateral;   melanoma surgery Right 2009   upper right arm    NIPPLE SPARING MASTECTOMY WITH SENTINEL LYMPH NODE BIOPSY Bilateral 01/17/2019   Procedure: BILATERAL NIPPLE SPARING MASTECTOMIES WITH LEFT SENTINEL LYMPH NODE MAPPING;  Surgeon: [name], MD;  Location: [facility];  Service: General;  Laterality: Bilateral;   REMOVAL OF BILATERAL TISSUE EXPANDERS WITH PLACEMENT OF BILATERAL BREAST IMPLANTS Bilateral 06/02/2019   Procedure: REMOVAL OF BILATERAL TISSUE EXPANDERS WITH PLACEMENT OF BILATERAL SILICONE BREAST IMPLANTS;  Surgeon: Glenna Fellows, MD;  Location: [facility];  Service: Plastics;  Laterality: Bilateral;   WISDOM TOOTH EXTRACTION      SOCIAL HISTORY: Social History   Socioeconomic History   Marital status: Married    Spouse name: Not on file   Number of children: Not on file   Years of education: Not on file   Highest education level: Not on file  Occupational History   Not on file  Tobacco Use   Smoking status: Never   Smokeless tobacco: Never  Vaping Use   Vaping Use: Never used  Substance and Sexual Activity   Alcohol use: Yes    Comment: occasional   Drug use: Never   Sexual activity: Yes    Birth control/protection: None  Other Topics Concern   Not on file  Social History Narrative   Not on file   Social Determinants of Health   Financial Resource Strain: Not on file  Food Insecurity: Not on file  Transportation Needs: No Transportation Needs (  11/09/2018)   PRAPARE - Administrator, Civil ServiceTransportation    Lack of Transportation (Medical): No    Lack of Transportation (Non-Medical): No  Physical Activity: Not on file  Stress: Not on file  Social Connections: Not on file  Intimate Partner Violence: Not At Risk (11/09/2018)   Humiliation, Afraid, Rape, and Kick questionnaire    Fear of Current or Ex-Partner: No    Emotionally Abused: No    Physically Abused: No    Sexually Abused: No    FAMILY HISTORY: Family History  Problem Relation Age of Onset   Hypertension Mother    Heart disease  Father    Breast cancer Maternal Aunt 6360 - 69   Breast cancer Maternal Aunt 7350 - 59   Lung cancer Paternal Aunt        diagnosed late 5550s   Breast cancer Maternal Grandmother 5360 - 69   Breast cancer Paternal Grandmother 5660 51- 69   Lung cancer Paternal Grandmother        thought to be breast cancer mets   Rectal cancer Paternal Grandmother        diagnosed 6070s   Lung cancer Paternal Grandfather        diagnosed 4870s    ALLERGIES:  is allergic to oxycodone.  MEDICATIONS:  Current Outpatient Medications  Medication Sig Dispense Refill   ALPRAZolam (XANAX) 0.5 MG tablet Take 1 tablet (0.5 mg total) by mouth 3 (three) times daily as needed for anxiety. 20 tablet 0   cetirizine (ZYRTEC) 10 MG tablet Take 10 mg by mouth daily.      meclizine (ANTIVERT) 25 MG tablet Take 25 mg by mouth 3 (three) times daily as needed (Vertigo/Motion Sickness).     Multiple Vitamins-Minerals (MULTIVITAMIN WITH MINERALS) tablet Take 1 tablet by mouth daily.     No current facility-administered medications for this visit.    PHYSICAL EXAMINATION: ECOG PERFORMANCE STATUS: 1 - Symptomatic but completely ambulatory  Vitals:   04/22/22 1214  BP: 118/80  Pulse: 64  Resp: 16  Temp: 97.8 F (36.6 C)  SpO2: 100%     Filed Weights   04/22/22 1214  Weight: 154 lb 12.8 oz (70.2 kg)      [ Day 1 ] 01/06/22  Height 5\' 6"  (1.676 m)  Weight 152 lb 8 oz (69.2 kg)  BSA (Calculated - sq m) 1.79 sq meters  Temp 97.7 F (36.5 C)  Temp src Oral  Pulse 68  Resp 16  BP 109/71    Physical Exam Constitutional:      Appearance: Normal appearance.  Cardiovascular:     Rate and Rhythm: Normal rate and regular rhythm.  Musculoskeletal:        General: No swelling.     Cervical back: Normal range of motion and neck supple. No rigidity.  Lymphadenopathy:     Cervical: No cervical adenopathy.  Skin:    General: Skin is warm and dry.  Neurological:     General: No focal deficit present.     Mental  Status: She is alert.      LABORATORY DATA:  I have reviewed the data as listed Lab Results  Component Value Date   WBC 5.4 04/22/2022   HGB 11.6 (L) 04/22/2022   HCT 36.0 04/22/2022   MCV 90.7 04/22/2022   PLT 208 04/22/2022   Lab Results  Component Value Date   NA 140 04/22/2022   K 3.7 04/22/2022   CL 106 04/22/2022   CO2 30 04/22/2022  RADIOGRAPHIC STUDIES: I have personally reviewed the radiological reports and agreed with the findings in the report.  ASSESSMENT AND PLAN: Stephanie [name] is a 37 y.o. female who presents to the clinic for follow up for left breast cancer.   #Malignant neoplasm of upper-outer quadrant of left breast:  -Breast US from 10/21/2021 showed showed hypoechoic oval vascular mass with indistinct margins measuring 1.5 x 1 cm x 1.4 cm.  -Grade 2, ER +70% weak to moderate staining, PR 0% negative, HER2 3+ by IHC, Ki-67 of 80%  -Baseline breast MRI from 11/19/2021 showed tumor size measuring up to 3.6 cm. No regional adenopathy. CT CAP and bone scan from 12/03/2021 showed no evidence of metastatic disease. -Recommended neoadjuvant chemotherapy with TCHP, started on 11/26/2021 -She completed 6 cycles of TCHP.  She is now on Herceptin and Perjeta maintenance. She is scheduled for surgery on 4/18. Given severe myalgias ( no myositis) and she wonders if this is from treatment, we will hold herceptin/perjeta at this time. She can RTC around Apr 25 to review final path and discuss adjuvant recommendations.  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  -Underwent bilateral nipple sparing mastectomies with left sentinel lymph node sampling .  -Pathology: (a) on the right, no evidence of malignancy (b) on the left, pT61mi pN0, stage IA invasive carcinoma, grade 2, with negative margins          (i) four left axillary sentinel nodes removed          (ii) ductal carcinoma in situ, grade 3, also present, with negative margins          (iii)  insufficient invasive tissue for prognostic panel evaluation  -Patient opted against anti-estrogen therapy which was optional.  -Adjuvant radiation was not indicated.   # Myalgias, no myositis when we evaluated in the past. This is slowly getting better but overall still bothersome. We recommended continuing Mg, holding herceptin/perjeta ( uncommon side effect) and re evaluate final path after surgery I wonder if this is related to chemo indeed which she received last on 3/1, so I hope there will be ongoing improvement.  All questions were answered. The patient knows to call the clinic with any problems, questions or concerns.  I have spent a total of 30 minutes minutes of face-to-face and non-face-to-face time, preparing to see the patient, performing a medically appropriate examination, counseling and educating the patient, documenting clinical information in the electronic health record,and care coordination.   Rachel Moulds MD  Phone: [phone]

## 2022-04-23 NOTE — Progress Notes (Signed)

## 2022-04-24 ENCOUNTER — Ambulatory Visit: Payer: BC Managed Care – PPO | Admitting: Hematology and Oncology

## 2022-04-24 ENCOUNTER — Ambulatory Visit: Payer: BC Managed Care – PPO

## 2022-04-29 ENCOUNTER — Other Ambulatory Visit: Payer: Self-pay | Admitting: Surgery

## 2022-04-29 ENCOUNTER — Ambulatory Visit
Admission: RE | Admit: 2022-04-29 | Discharge: 2022-04-29 | Disposition: A | Discharge status: Home / Self Care | Payer: BC Managed Care – PPO | Source: Ambulatory Visit | Attending: Surgery | Admitting: Surgery

## 2022-04-29 DIAGNOSIS — C50912 Malignant neoplasm of unspecified site of left female breast: Secondary | ICD-10-CM

## 2022-04-29 HISTORY — PX: BREAST BIOPSY: SHX20

## 2022-04-29 NOTE — Progress Notes (Signed)
CHG wash given with instructions to pt. Pt verbalized understanding.

## 2022-04-30 ENCOUNTER — Other Ambulatory Visit: Payer: Self-pay

## 2022-04-30 ENCOUNTER — Encounter (HOSPITAL_BASED_OUTPATIENT_CLINIC_OR_DEPARTMENT_OTHER): Payer: Self-pay | Admitting: Surgery

## 2022-04-30 ENCOUNTER — Encounter (HOSPITAL_BASED_OUTPATIENT_CLINIC_OR_DEPARTMENT_OTHER)
Admission: RE | Disposition: A | Discharge status: Home / Self Care | Payer: Self-pay | Source: Home / Self Care | Attending: Surgery

## 2022-04-30 ENCOUNTER — Ambulatory Visit (HOSPITAL_BASED_OUTPATIENT_CLINIC_OR_DEPARTMENT_OTHER)
Admission: RE | Admit: 2022-04-30 | Discharge: 2022-04-30 | Disposition: A | Discharge status: Home / Self Care | Payer: BC Managed Care – PPO | Attending: Surgery | Admitting: Surgery

## 2022-04-30 ENCOUNTER — Ambulatory Visit
Admission: RE | Admit: 2022-04-30 | Discharge: 2022-04-30 | Disposition: A | Discharge status: Home / Self Care | Payer: BC Managed Care – PPO | Source: Ambulatory Visit | Attending: Surgery | Admitting: Surgery

## 2022-04-30 ENCOUNTER — Ambulatory Visit (HOSPITAL_BASED_OUTPATIENT_CLINIC_OR_DEPARTMENT_OTHER): Payer: BC Managed Care – PPO | Admitting: Anesthesiology

## 2022-04-30 DIAGNOSIS — Z9882 Breast implant status: Secondary | ICD-10-CM | POA: Diagnosis not present

## 2022-04-30 DIAGNOSIS — C50412 Malignant neoplasm of upper-outer quadrant of left female breast: Secondary | ICD-10-CM | POA: Diagnosis present

## 2022-04-30 DIAGNOSIS — C50912 Malignant neoplasm of unspecified site of left female breast: Secondary | ICD-10-CM

## 2022-04-30 DIAGNOSIS — Z170 Estrogen receptor positive status [ER+]: Secondary | ICD-10-CM | POA: Insufficient documentation

## 2022-04-30 DIAGNOSIS — Z9221 Personal history of antineoplastic chemotherapy: Secondary | ICD-10-CM | POA: Diagnosis not present

## 2022-04-30 DIAGNOSIS — Z01818 Encounter for other preprocedural examination: Secondary | ICD-10-CM

## 2022-04-30 HISTORY — PX: BREAST LUMPECTOMY WITH RADIOACTIVE SEED AND SENTINEL LYMPH NODE BIOPSY: SHX6550

## 2022-04-30 LAB — POCT PREGNANCY, URINE: Preg Test, Ur: NEGATIVE

## 2022-04-30 SURGERY — BREAST LUMPECTOMY WITH RADIOACTIVE SEED AND SENTINEL LYMPH NODE BIOPSY
Anesthesia: Regional | Site: Breast | Laterality: Left

## 2022-04-30 MED ORDER — CHLORHEXIDINE GLUCONATE CLOTH 2 % EX PADS: 6.0000 | MEDICATED_PAD | Freq: Once | CUTANEOUS | Status: DC

## 2022-04-30 MED ORDER — METHYLENE BLUE 1 % INJ SOLN
INTRAVENOUS | Status: DC | PRN
  Administered 2022-04-30: 1 mL

## 2022-04-30 MED ORDER — SODIUM CHLORIDE (PF) 0.9 % IJ SOLN
INTRAMUSCULAR | Status: AC
  Filled 2022-04-30: qty 10

## 2022-04-30 MED ORDER — PROPOFOL 10 MG/ML IV BOLUS
INTRAVENOUS | Status: AC
  Filled 2022-04-30: qty 20

## 2022-04-30 MED ORDER — ACETAMINOPHEN 500 MG PO TABS
ORAL_TABLET | ORAL | Status: AC
  Filled 2022-04-30: qty 2

## 2022-04-30 MED ORDER — CEFAZOLIN SODIUM-DEXTROSE 2-4 GM/100ML-% IV SOLN: 2.0000 g | INTRAVENOUS | Status: DC

## 2022-04-30 MED ORDER — ACETAMINOPHEN 160 MG/5ML PO SOLN: 1000.0000 mg | Freq: Once | ORAL | Status: DC | PRN

## 2022-04-30 MED ORDER — FENTANYL CITRATE (PF) 100 MCG/2ML IJ SOLN
INTRAMUSCULAR | Status: AC
  Filled 2022-04-30: qty 2

## 2022-04-30 MED ORDER — MIDAZOLAM HCL 2 MG/2ML IJ SOLN
2.0000 mg | Freq: Once | INTRAMUSCULAR | Status: AC
  Administered 2022-04-30: 1 mg via INTRAVENOUS

## 2022-04-30 MED ORDER — DEXAMETHASONE SODIUM PHOSPHATE 10 MG/ML IJ SOLN
INTRAMUSCULAR | Status: DC | PRN
  Administered 2022-04-30: 5 mg via INTRAVENOUS

## 2022-04-30 MED ORDER — IBUPROFEN 800 MG PO TABS
800.0000 mg | ORAL_TABLET | Freq: Three times a day (TID) | ORAL | 0 refills | Status: DC | PRN
Start: 2022-04-30 — End: 2023-01-15

## 2022-04-30 MED ORDER — MIDAZOLAM HCL 2 MG/2ML IJ SOLN
INTRAMUSCULAR | Status: AC
  Filled 2022-04-30: qty 2

## 2022-04-30 MED ORDER — LIDOCAINE 2% (20 MG/ML) 5 ML SYRINGE
INTRAMUSCULAR | Status: AC
  Filled 2022-04-30: qty 5

## 2022-04-30 MED ORDER — CEFAZOLIN SODIUM-DEXTROSE 2-3 GM-%(50ML) IV SOLR
INTRAVENOUS | Status: DC | PRN
  Administered 2022-04-30: 2 g via INTRAVENOUS

## 2022-04-30 MED ORDER — MAGTRACE LYMPHATIC TRACER
INTRAMUSCULAR | Status: DC | PRN
  Administered 2022-04-30: 2 mL via INTRAMUSCULAR

## 2022-04-30 MED ORDER — FENTANYL CITRATE (PF) 100 MCG/2ML IJ SOLN
INTRAMUSCULAR | Status: DC | PRN
  Administered 2022-04-30: 25 ug via INTRAVENOUS
  Administered 2022-04-30: 50 ug via INTRAVENOUS
  Administered 2022-04-30: 25 ug via INTRAVENOUS

## 2022-04-30 MED ORDER — DEXAMETHASONE SODIUM PHOSPHATE 10 MG/ML IJ SOLN
INTRAMUSCULAR | Status: AC
  Filled 2022-04-30: qty 1

## 2022-04-30 MED ORDER — SODIUM CHLORIDE 0.9 % IV SOLN
INTRAVENOUS | Status: DC | PRN
  Administered 2022-04-30: 100 mL

## 2022-04-30 MED ORDER — MIDAZOLAM HCL 2 MG/2ML IJ SOLN
INTRAMUSCULAR | Status: DC | PRN
  Administered 2022-04-30: 2 mg via INTRAVENOUS

## 2022-04-30 MED ORDER — METHYLENE BLUE 1 % INJ SOLN
INTRAVENOUS | Status: AC
  Filled 2022-04-30: qty 10

## 2022-04-30 MED ORDER — BUPIVACAINE-EPINEPHRINE (PF) 0.5% -1:200000 IJ SOLN
INTRAMUSCULAR | Status: DC | PRN
  Administered 2022-04-30: 25 mL

## 2022-04-30 MED ORDER — PROPOFOL 10 MG/ML IV BOLUS
INTRAVENOUS | Status: DC | PRN
  Administered 2022-04-30: 200 mg via INTRAVENOUS

## 2022-04-30 MED ORDER — FENTANYL CITRATE (PF) 100 MCG/2ML IJ SOLN
100.0000 ug | Freq: Once | INTRAMUSCULAR | Status: AC
  Administered 2022-04-30: 50 ug via INTRAVENOUS

## 2022-04-30 MED ORDER — SODIUM CHLORIDE (PF) 0.9 % IJ SOLN
INTRAMUSCULAR | Status: DC | PRN
  Administered 2022-04-30: 4 mL

## 2022-04-30 MED ORDER — LIDOCAINE-EPINEPHRINE (PF) 1.5 %-1:200000 IJ SOLN
INTRAMUSCULAR | Status: DC | PRN
  Administered 2022-04-30: 5 mL via PERINEURAL

## 2022-04-30 MED ORDER — SODIUM CHLORIDE 0.9 % IV SOLN
INTRAVENOUS | Status: AC
  Filled 2022-04-30: qty 10

## 2022-04-30 MED ORDER — TRAMADOL HCL 50 MG PO TABS: 50.0000 mg | ORAL_TABLET | Freq: Four times a day (QID) | ORAL | 0 refills | Status: DC | PRN

## 2022-04-30 MED ORDER — AMISULPRIDE (ANTIEMETIC) 5 MG/2ML IV SOLN
10.0000 mg | Freq: Once | INTRAVENOUS | Status: AC
  Administered 2022-04-30: 10 mg via INTRAVENOUS

## 2022-04-30 MED ORDER — ONDANSETRON HCL 4 MG/2ML IJ SOLN
INTRAMUSCULAR | Status: AC
  Filled 2022-04-30: qty 2

## 2022-04-30 MED ORDER — LACTATED RINGERS IV SOLN: INTRAVENOUS | Status: DC

## 2022-04-30 MED ORDER — AMISULPRIDE (ANTIEMETIC) 5 MG/2ML IV SOLN
INTRAVENOUS | Status: AC
  Filled 2022-04-30: qty 4

## 2022-04-30 MED ORDER — ACETAMINOPHEN 500 MG PO TABS: 1000.0000 mg | ORAL_TABLET | Freq: Once | ORAL | Status: DC | PRN

## 2022-04-30 MED ORDER — LACTATED RINGERS IV SOLN: INTRAVENOUS | Status: DC | PRN

## 2022-04-30 MED ORDER — ACETAMINOPHEN 10 MG/ML IV SOLN: 1000.0000 mg | Freq: Once | INTRAVENOUS | Status: DC | PRN

## 2022-04-30 MED ORDER — FENTANYL CITRATE (PF) 100 MCG/2ML IJ SOLN: 25.0000 ug | INTRAMUSCULAR | Status: DC | PRN

## 2022-04-30 MED ORDER — ONDANSETRON HCL 4 MG/2ML IJ SOLN
INTRAMUSCULAR | Status: DC | PRN
  Administered 2022-04-30: 4 mg via INTRAVENOUS

## 2022-04-30 MED ORDER — HEMOSTATIC AGENTS (NO CHARGE) OPTIME
TOPICAL | Status: DC | PRN
  Administered 2022-04-30: 1 via TOPICAL

## 2022-04-30 MED ORDER — ACETAMINOPHEN 500 MG PO TABS
1000.0000 mg | ORAL_TABLET | ORAL | Status: AC
  Administered 2022-04-30: 1000 mg via ORAL

## 2022-04-30 MED ORDER — BUPIVACAINE HCL (PF) 0.25 % IJ SOLN
INTRAMUSCULAR | Status: DC | PRN
  Administered 2022-04-30: 12 mL

## 2022-04-30 MED ORDER — PROPOFOL 500 MG/50ML IV EMUL
INTRAVENOUS | Status: DC | PRN
  Administered 2022-04-30: 50 ug/kg/min via INTRAVENOUS

## 2022-04-30 MED ORDER — CEFAZOLIN SODIUM-DEXTROSE 2-4 GM/100ML-% IV SOLN
INTRAVENOUS | Status: AC
  Filled 2022-04-30: qty 100

## 2022-04-30 SURGICAL SUPPLY — 49 items
ADH SKN CLS APL DERMABOND .7 (GAUZE/BANDAGES/DRESSINGS) ×1
APL PRP STRL LF DISP 70% ISPRP (MISCELLANEOUS) ×1
APPLIER CLIP 9.375 MED OPEN (MISCELLANEOUS) ×1
APR CLP MED 9.3 20 MLT OPN (MISCELLANEOUS) ×1
BINDER BREAST LRG (GAUZE/BANDAGES/DRESSINGS) IMPLANT
BINDER BREAST MEDIUM (GAUZE/BANDAGES/DRESSINGS) IMPLANT
BINDER BREAST XLRG (GAUZE/BANDAGES/DRESSINGS) IMPLANT
BINDER BREAST XXLRG (GAUZE/BANDAGES/DRESSINGS) IMPLANT
BLADE SURG 15 STRL LF DISP TIS (BLADE) ×1 IMPLANT
BLADE SURG 15 STRL SS (BLADE) ×1
CANISTER SUC SOCK COL 7IN (MISCELLANEOUS) IMPLANT
CANISTER SUCT 1200ML W/VALVE (MISCELLANEOUS) ×1 IMPLANT
CHLORAPREP W/TINT 26 (MISCELLANEOUS) ×1 IMPLANT
CLIP APPLIE 9.375 MED OPEN (MISCELLANEOUS) ×1 IMPLANT
COVER BACK TABLE 60X90IN (DRAPES) ×1 IMPLANT
COVER MAYO STAND STRL (DRAPES) ×1 IMPLANT
COVER PROBE CYLINDRICAL 5X96 (MISCELLANEOUS) ×1 IMPLANT
DERMABOND ADVANCED .7 DNX12 (GAUZE/BANDAGES/DRESSINGS) ×1 IMPLANT
DRAPE LAPAROSCOPIC ABDOMINAL (DRAPES) ×1 IMPLANT
DRAPE UTILITY XL STRL (DRAPES) ×1 IMPLANT
ELECT COATED BLADE 2.86 ST (ELECTRODE) ×1 IMPLANT
ELECT REM PT RETURN 9FT ADLT (ELECTROSURGICAL) ×1
ELECTRODE REM PT RTRN 9FT ADLT (ELECTROSURGICAL) ×1 IMPLANT
GLOVE BIOGEL PI IND STRL 8 (GLOVE) ×1 IMPLANT
GLOVE ECLIPSE 8.0 STRL XLNG CF (GLOVE) ×1 IMPLANT
GOWN STRL REUS W/ TWL LRG LVL3 (GOWN DISPOSABLE) ×2 IMPLANT
GOWN STRL REUS W/ TWL XL LVL3 (GOWN DISPOSABLE) ×1 IMPLANT
GOWN STRL REUS W/TWL LRG LVL3 (GOWN DISPOSABLE)
GOWN STRL REUS W/TWL XL LVL3 (GOWN DISPOSABLE) ×2
HEMOSTAT ARISTA ABSORB 3G PWDR (HEMOSTASIS) IMPLANT
HEMOSTAT SNOW SURGICEL 2X4 (HEMOSTASIS) IMPLANT
KIT MARKER MARGIN INK (KITS) ×1 IMPLANT
NDL HYPO 25X1 1.5 SAFETY (NEEDLE) ×1 IMPLANT
NDL SAFETY ECLIP 18X1.5 (MISCELLANEOUS) IMPLANT
NEEDLE HYPO 25X1 1.5 SAFETY (NEEDLE) ×1 IMPLANT
NS IRRIG 1000ML POUR BTL (IV SOLUTION) ×1 IMPLANT
PACK BASIN DAY SURGERY FS (CUSTOM PROCEDURE TRAY) ×1 IMPLANT
PENCIL SMOKE EVACUATOR (MISCELLANEOUS) ×1 IMPLANT
SLEEVE SCD COMPRESS KNEE MED (STOCKING) ×1 IMPLANT
SPIKE FLUID TRANSFER (MISCELLANEOUS) IMPLANT
SPONGE T-LAP 4X18 ~~LOC~~+RFID (SPONGE) ×1 IMPLANT
SUT MNCRL AB 4-0 PS2 18 (SUTURE) ×1 IMPLANT
SUT VICRYL 3-0 CR8 SH (SUTURE) ×1 IMPLANT
SYR CONTROL 10ML LL (SYRINGE) ×1 IMPLANT
TOWEL GREEN STERILE FF (TOWEL DISPOSABLE) ×1 IMPLANT
TRACER MAGTRACE VIAL (MISCELLANEOUS) IMPLANT
TRAY FAXITRON CT DISP (TRAY / TRAY PROCEDURE) ×1 IMPLANT
TUBE CONNECTING 20X1/4 (TUBING) ×1 IMPLANT
YANKAUER SUCT BULB TIP NO VENT (SUCTIONS) ×1 IMPLANT

## 2022-04-30 NOTE — H&P (Signed)
History of Present Illness: Stephanie [name] is a 37 y.o. female who is seen today for follow-up after recurrent left breast cancer after bilateral nipple sparing mastectomies in 2021. She was seen last October after mass recurred in the left upper outer quadrant. Core biopsy of this was invasive ductal carcinoma grade 3 ER positive PR negative HER2/neu negative with AKI 67 of 80%. She received adjuvant therapy and has finished that. Repeat MRI shows about a 50% reduction in size of the area of enhancement without lymphadenopathy. She is here today to discuss surgical treatment. She is done fairly well chemotherapy but her dose was reduced due to toxicities of diarrhea..    Review of Systems: A complete review of systems was obtained from the patient. I have reviewed this information and discussed as appropriate with the patient. See HPI as well for other ROS.    Medical History: Past Medical History:  Diagnosis Date  History of cancer   There is no problem list on file for this patient.  Past Surgical History:  Procedure Laterality Date  breast reconstruction surgery Bilateral  MASTECTOMY  melanoma removal N/A    Allergies  Allergen Reactions  Oxycodone Nausea  Severe nausea   Current Outpatient Medications on File Prior to Visit  Medication Sig Dispense Refill  cetirizine (ZYRTEC) 10 MG tablet Take 10 mg by mouth once daily  clascoterone (WINLEVI) 1 % Crea Apply topically  drospirenone, contraceptive, (SLYND) 4 mg (28) Tab  meclizine (ANTIVERT) 25 MG tablet Take by mouth 3 (three) times daily as needed  SLYND 4 mg (28) Tab   No current facility-administered medications on file prior to visit.   Family History  Problem Relation Age of Onset  High blood pressure (Hypertension) Mother  Hyperlipidemia (Elevated cholesterol) Father  Myocardial Infarction (Heart attack) Father    Social History   Tobacco Use  Smoking Status Never  Smokeless Tobacco Never    Social History    Socioeconomic History  Marital status: Married  Tobacco Use  Smoking status: Never  Smokeless tobacco: Never  Vaping Use  Vaping Use: Never used  Substance and Sexual Activity  Alcohol use: Yes  Drug use: Never   Objective:   Vitals:  03/23/22 1409  PainSc: 0-No pain  PainLoc: Breast   There is no height or weight on file to calculate BMI.  Physical Exam Exam conducted with a chaperone present.  HENT:  Head: Normocephalic.  Chest:  Breasts: Right: Absent.  Left: Absent.   Comments: Reconstruction in place. No mass noted in the left breast. No evidence of adenopathy in the left or right axilla. No supraclavicular adenopathy. Musculoskeletal:  General: Normal range of motion.  Cervical back: Normal range of motion.  Neurological:  Mental Status: She is alert.  Psychiatric:  Mood and Affect: Mood normal.  Behavior: Behavior normal.     Labs, Imaging and Diagnostic Testing:  CLINICAL DATA: Invasive breast cancer, assess treatment response. History of bilateral nipple sparing mastectomy in January of 2021 for LEFT breast ductal carcinoma in situ. Patient presented with palpable mass in the UPPER OUTER QUADRANT of the LEFT breast in October 2023. Biopsy showed grade 2 invasive ductal carcinoma with lymphovascular invasion. The patient has undergone neoadjuvant chemotherapy from November 2023 to March of 2024.  EXAM: BILATERAL BREAST MRI WITH AND WITHOUT CONTRAST  TECHNIQUE: Multiplanar, multisequence MR images of both breasts were obtained prior to and following the intravenous administration of 7.5 ml of Vueway  Three-dimensional MR images were rendered by post-processing  of the original MR data on an independent workstation. The three-dimensional MR images were interpreted, and findings are reported in the following complete MRI report for this study. Three dimensional images were evaluated at the independent interpreting workstation using the DynaCAD  thin client.  COMPARISON: 11/19/2021  FINDINGS: Breast composition: c. Heterogeneous fibroglandular tissue.  Background parenchymal enhancement: Moderate  Right breast: No mass or abnormal enhancement. Retroglandular silicone implant.  Left breast: Within the UPPER-OUTER QUADRANT of the LEFT breast, tissue marker clip is identified at the site of previous biopsy. A residual enhancing irregular mass with heterogeneous enhancement measures 1.5 x 0.7 centimeters (image 48 of series 8). Previously, mass measured 3.8 x 2.0 centimeters. There is residual washout type enhancement kinetics. Patient has a retroglandular silicone implant.  Lymph nodes: No abnormal appearing lymph nodes.  Ancillary findings: None.  IMPRESSION: 1. Significant improvement in the known malignancy in the UPPER-OUTER QUADRANT of the LEFT breast, now measuring 1.5 x 0.7 centimeters (previously 3.8 x 2.0 centimeters.) 2. No new or suspicious findings in either breast. 3. Bilateral retroglandular silicone implants. 4. No adenopathy.  RECOMMENDATION: Treatment plan for known LEFT breast malignancy.  BI-RADS CATEGORY 6: Known biopsy-proven malignancy.   Electronically Signed By: Norva Pavlov M.D. On: 03/20/2022 16:41  PROGNOSTIC INDICATORS Results: IMMUNOHISTOCHEMICAL AND MORPHOMETRIC ANALYSIS PERFORMED MANUALLY The tumor cells are positive for Her2 (3+). Estrogen Receptor: 70%, POSITIVE, WEAK-MODERATE STAINING INTENSITY Progesterone Receptor: 0%, NEGATIVE Proliferation Marker Ki67: 80% COMMENT: The negative hormone receptor study(ies) in this case has an internal positive control. REFERENCE RANGE ESTROGEN RECEPTOR NEGATIVE 0% POSITIVE =>1% REFERENCE RANGE PROGESTERONE RECEPTOR NEGATIVE 0% POSITIVE =>1% All controls stained appropriately Jerene Bears MD Pathologist, Electronic Signature ( Signed 11/03/2021) FINAL DIAGNOSIS Diagnosis Breast, left, needle core biopsy, 1:30 o'clock 6cmfn  UOQ, hydromark spiral clip - INVASIVE DUCTAL CARCINOMA, SEE NOTE 1 of 3 FINAL for [name], Stephanie RENEE ([id number]) Diagnosis(continued) - TUBULE FORMATION: SCORE 3 - NUCLEAR PLEOMORPHISM: SCORE 3 - MITOTIC COUNT: SCORE 2 - TOTAL SCORE: 8 - OVERALL GRADE: 2 - LYMPHOVASCULAR INVASION: PRESENT - CANCER LENGTH: 1.1 CM - CALCIFICATIONS: NOT IDENTIFIED NOTE: DR. Kenard Gower REVIEWED THE CASE AND CONCURS WITH THE INTERPRETATION. A BREAST PROGNOSTIC PROFILE (ER, PR, KI-67 AND HER2) IS PENDING AND WILL BE REPORTED IN AN ADDENDUM. SUSAN WAS NOTIFIED ON 10/31/2021. Clifton James M.D. Pathologist, Electronic Signature (Case signed 10/31/2021)  Assessment and Plan:   Diagnoses and all orders for this visit:  Recurrent breast cancer, left (CMS-HCC)    Patient had a good response to chemotherapy. Certainly recommend local excision of this area using a seed to guide [place]. Discussed sentinel lymph node mapping. She had 4 lymph nodes removed back in 2021 when she underwent nipple preserving bilateral mastectomies. Certainly an attempt could be made to remap her but given her physical exam findings and MRI findings, the benefit of lymph node dissection was reviewed as well as complications of lymphedema and shoulder stiffness. I think an attempt at mapping is warranted. We discussed lymph node dissection and the pros and cons of this as well as reviewing survival statistics. At this point in time, she would like to stay away from that if possible. I think she would benefit from input from radiation oncology and plastic surgery and I will reach out to the specialties. Will plan on seed lumpectomy left left-sided lymph node mapping and get that scheduled. Also reviewed removal of the implant as well which I do not think is necessary at this point in time.The procedure has been discussed  with the patient. Alternatives to surgery have been discussed with the patient. Risks of surgery include bleeding, Infection, Seroma  formation, death, and the need for further surgery. The patient understands and wishes to proceed.   No follow-ups on file.  Hayden Rasmussen, MD

## 2022-04-30 NOTE — Interval H&P Note (Signed)
History and Physical Interval Note:  04/30/2022 12:32 PM  Stephanie [name]  has presented today for surgery, with the diagnosis of LEFT BREAST CANCER.  The various methods of treatment have been discussed with the patient and family. After consideration of risks, benefits and other options for treatment, the patient has consented to  Procedure(s): LEFT BREAST LUMPECTOMY WITH RADIOACTIVE SEED AND SENTINEL LYMPH NODE BIOPSY (Left) as a surgical intervention.  The patient's history has been reviewed, patient examined, no change in status, stable for surgery.  I have reviewed the patient's chart and labs.  Questions were answered to the patient's satisfaction.   Sentinel lymph node mapping and dissection has been discussed with the patient.  Risk of bleeding,  Infection,  Seroma formation,  Additional procedures,,  Shoulder weakness ,  Shoulder stiffness,  Nerve and blood vessel injury and reaction to the mapping dyes have been discussed.  Alternatives to surgery have been discussed with the patient.  The patient agrees to proceed. The procedure has been discussed with the patient. Alternatives to surgery have been discussed with the patient.  Risks of surgery include bleeding,  Infection,  Seroma formation, death,  and the need for further surgery.   The patient understands and wishes to proceed.   Reggie Bise A Jemaine Prokop

## 2022-04-30 NOTE — Anesthesia Procedure Notes (Signed)
Procedure Name: LMA Insertion Date/Time: 04/30/2022 1:03 PM  Performed by: Karen Kitchens, CRNAPre-anesthesia Checklist: Patient identified, Emergency Drugs available, Suction available and Patient being monitored Patient Re-evaluated:Patient Re-evaluated prior to induction Oxygen Delivery Method: Circle system utilized Preoxygenation: Pre-oxygenation with 100% oxygen Induction Type: IV induction Ventilation: Mask ventilation without difficulty LMA: LMA inserted LMA Size: 4.0 Number of attempts: 1 Airway Equipment and Method: Bite block Placement Confirmation: positive ETCO2, CO2 detector and breath sounds checked- equal and bilateral Tube secured with: Tape Dental Injury: Teeth and Oropharynx as per pre-operative assessment

## 2022-04-30 NOTE — Progress Notes (Signed)
Assisted Dr. Moser with left, pectoralis, ultrasound guided block. Side rails up, monitors on throughout procedure. See vital signs in flow sheet. Tolerated Procedure well. 

## 2022-04-30 NOTE — Anesthesia Procedure Notes (Addendum)
Anesthesia Regional Block: Pectoralis block   Pre-Anesthetic Checklist: , timeout performed,  Correct Patient, Correct Site, Correct Laterality,  Correct Procedure, Correct Position, site marked,  Risks and benefits discussed,  Surgical consent,  Pre-op evaluation,  At surgeon's request and post-op pain management  Laterality: Left  Prep: chloraprep       Needles:  Injection technique: Single-shot      Needle Length: 9cm  Needle Gauge: 22     Additional Needles: Arrow StimuQuik ECHO Echogenic Stimulating PNB Needle  Procedures:,,,, ultrasound used (permanent image in chart),,    Narrative:  Start time: 04/30/2022 12:12 PM End time: 04/30/2022 12:18 PM Injection made incrementally with aspirations every 5 mL.  Performed by: Personally  Anesthesiologist: Val Eagle, MD

## 2022-04-30 NOTE — Transfer of Care (Signed)
Immediate Anesthesia Transfer of Care Note  Patient: Stephanie [name]  Procedure(s) Performed: LEFT BREAST LUMPECTOMY WITH RADIOACTIVE SEED AND SENTINEL LYMPH NODE BIOPSY (Left: Breast)  Patient Location: PACU  Anesthesia Type:General  Level of Consciousness: awake, drowsy, and patient cooperative  Airway & Oxygen Therapy: Patient Spontanous Breathing and Patient connected to face mask oxygen  Post-op Assessment: Report given to RN and Post -op Vital signs reviewed and stable  Post vital signs: Reviewed and stable  Last Vitals:  Vitals Value Taken Time  BP 111/73 04/30/22 1433  Temp    Pulse 85 04/30/22 1437  Resp 15 04/30/22 1437  SpO2 100 % 04/30/22 1437  Vitals shown include unvalidated device data.  Last Pain:  Vitals:   04/30/22 1134  TempSrc: Oral  PainSc: 0-No pain      Patients Stated Pain Goal: 5 (04/30/22 1134)  Complications: No notable events documented.

## 2022-04-30 NOTE — Op Note (Signed)
Preoperative diagnosis: Stage I left breast cancer upper outer quadrant  Postoperative diagnosis: Same  Procedure: Left breast seed localized lumpectomy with deep left axillary sentinel lymph node mapping using methylene blue dye and mag trace  Surgeon: [name], MD  Anesthesia: General with pectoral block  EBL: Minimal  Specimen: Left breast tissue with seed.  The clip was not identified.  The entire area is widely excised to include the area where the seed was, the adjacent skin, and the entire cavity.  This was taken down to the actual implant from her previous mastectomy.  Reviewed with radiologist and clip not identified initially.  Calcifications though the specimen was corresponds to the area of interest.  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  She opted for bilateral nipple sparing mastectomy.  She received no adjuvant therapy.  She presented in October 2023 with a mass in the skin of the left breast overlying her implant.  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  She has finished neoadjuvant chemotherapy.  She is here today for wide excision of this area as well as a reattempted sentinel lymph node mapping.  We discussed the pros and cons of this approach.  We discussed breast conserving surgery versus mastectomy with removal of the implant.  She appeared to have a good response but still had some residual calcifications in the area.  Most of what need to be excised would be skin since she has had a previous mastectomy I discussed that with her as well as the cosmesis of this.  After lengthy discussion, she wished to try to preserve her implant which I thought was reasonable.  She did understand that she may lose the implant or require mastectomy depending on final pathology.  I also discussed trying to  reattempt mapping.  I told her the success rates are about 8085% and 2 tracers can be used.  She understood the above and wished to proceed with breast conserving surgery and attempt to save her implant and avoid mastectomy if possible.  She appeared to have an excellent result from chemotherapy and being HER2/neu positive we felt she had a reasonable sponsor to have a complete response.  Risks and benefits of the procedure were discussed with the patient.  Risk of bleeding, infection, cosmetic deformity, implant rupture, left arm and shoulder stiffness, numbness, injury major vascular structures including nerves, hematoma, seroma, and the need for the treatments and/or procedures reviewed with the patient.   Description of procedure: The patient was met in the holding area and questions answered.  Left breast was marked as the correct site and of note she had a seed and clip placed by radiology.  Films were available for review.  After induction of general esthesia, using sterile prep with alcohol and 2 cc of methylene blue diluted 4 cc x 2 cc by saline and 2 cc of MAC tracer injected in the upper skin of the left chest care taken to avoid the implant.  This was massaged.  The left breast was then prepped and draped in a sterile fashion and an additional timeout was performed.  Neoprobe was used to identify the seed the left breast upper outer quadrant.  I marked this with a marking pen.  An elliptical incision was made around the signal of skin to encompass that.  I took the  dissection down to the subcutaneous fat.  There is very little tissue below this except the subcu fat to the skin and then the implant was encountered.  I excised all of this out but in the process the seed came out of the specimen which I saw directly.  I grabbed the seed and put in separate containers.  That area of breast tissue was excised in its entirety.  Imaging did not reveal the clip.  There were numerous calcifications consistent  with the imaging finding.  I opted to excise all margins around this.  I even took the tissue overlying the implant without damaging the implant.  Part of that was the capsule of the scar tissue.  Despite excising this there is no evidence of the clip.  I then took more skin and excised the entire cavity around that.  Again the clip was identified.  More likely it was dislodged or pulled out when the sponges.  At this point in time I felt that the excision was wide enough and the calcifications were present within the specimen consistent with the imaging findings and this was taken down to the capsule of the implant.  This was not fully violated and I took a very thin layer of the capsule off  leaving it intact.  Hemostasis achieved.  Local anesthetic infiltrated carefully without rupture the implant.  I then chose to close the skin with a combination of 3-0 Vicryl and 4 Monocryl.  MAC trace probe used.  Uptake noted in left axilla.  Incision was made through the old scar for previous lymph node mapping.  Dissection was carried down into the deep extra contents level 1.  1 node was encountered that it taken up the tracer.  I did not see any other blue nodes or any significant uptake otherwise.  This was removed and sent as sentinel node.  Hemostasis was achieved.  Irrigation used.  Local anesthetic infiltrated carefully.  I then used Arista for any oozing encountered.  Hemostasis was then excellent.  Deep tissue planes closed with 3-0 Vicryl.  4 Monocryl used to close skin.  Dermabond applied.  Breast binder placed.  All counts found to be correct.  The patient was awoke extubated taken to recovery in satisfactory condition.

## 2022-04-30 NOTE — Discharge Instructions (Addendum)
[facility] Office Phone Number [phone]  BREAST BIOPSY/ PARTIAL MASTECTOMY: POST OP INSTRUCTIONS  Always review your discharge instruction sheet given to you by the facility where your surgery was performed.  IF YOU HAVE DISABILITY OR FAMILY LEAVE FORMS, YOU MUST BRING THEM TO THE OFFICE FOR PROCESSING.  DO NOT GIVE THEM TO YOUR DOCTOR.  A prescription for pain medication may be given to you upon discharge.  Take your pain medication as prescribed, if needed.  If narcotic pain medicine is not needed, then you may take acetaminophen (Tylenol) or ibuprofen (Advil) as needed. Take your usually prescribed medications unless otherwise directed If you need a refill on your pain medication, please contact your pharmacy.  They will contact our office to request authorization.  Prescriptions will not be filled after 5pm or on week-ends. You should eat very light the first 24 hours after surgery, such as soup, crackers, pudding, etc.  Resume your normal diet the day after surgery. Most patients will experience some swelling and bruising in the breast.  Ice packs and a good support bra will help.  Swelling and bruising can take several days to resolve.  It is common to experience some constipation if taking pain medication after surgery.  Increasing fluid intake and taking a stool softener will usually help or prevent this problem from occurring.  A mild laxative (Milk of Magnesia or Miralax) should be taken according to package directions if there are no bowel movements after 48 hours. Unless discharge instructions indicate otherwise, you may remove your bandages 24-48 hours after surgery, and you may shower at that time.  You may have steri-strips (small skin tapes) in place directly over the incision.  These strips should be left on the skin for 7-10 days.  If your surgeon used skin glue on the incision, you may shower in 24 hours.  The glue will flake off over the next 2-3 weeks.  Any  sutures or staples will be removed at the office during your follow-up visit. ACTIVITIES:  You may resume regular daily activities (gradually increasing) beginning the next day.  Wearing a good support bra or sports bra minimizes pain and swelling.  You may have sexual intercourse when it is comfortable. You may drive when you no longer are taking prescription pain medication, you can comfortably wear a seatbelt, and you can safely maneuver your car and apply brakes. RETURN TO WORK:  ______________________________________________________________________________________ Bonita Quin should see your doctor in the office for a follow-up appointment approximately two weeks after your surgery.  Your doctor's nurse will typically make your follow-up appointment when she calls you with your pathology report.  Expect your pathology report 2-3 business days after your surgery.  You may call to check if you do not hear from [place] after three days. OTHER INSTRUCTIONS: _______________________________________________________________________________________________ _____________________________________________________________________________________________________________________________________ _____________________________________________________________________________________________________________________________________ _____________________________________________________________________________________________________________________________________  WHEN TO CALL YOUR DOCTOR: Fever over 101.0 Nausea and/or vomiting. Extreme swelling or bruising. Continued bleeding from incision. Increased pain, redness, or drainage from the incision.  The clinic staff is available to answer your questions during regular business hours.  Please don't hesitate to call and ask to speak to one of the nurses for clinical concerns.  If you have a medical emergency, go to the nearest emergency room or call 911.  A surgeon from [facility] is always on call at the hospital.   May have Tylenol today after 5:40 PM   Post Anesthesia Home Care Instructions  Activity: Get plenty of rest for the remainder  of the day. A responsible individual must stay with you for 24 hours following the procedure.  For the next 24 hours, DO NOT: -Drive a car -Advertising copywriter -Drink alcoholic beverages -Take any medication unless instructed by your physician -Make any legal decisions or sign important papers.  Meals: Start with liquid foods such as gelatin or soup. Progress to regular foods as tolerated. Avoid greasy, spicy, heavy foods. If nausea and/or vomiting occur, drink only clear liquids until the nausea and/or vomiting subsides. Call your physician if vomiting continues.  Special Instructions/Symptoms: Your throat may feel dry or sore from the anesthesia or the breathing tube placed in your throat during surgery. If this causes discomfort, gargle with warm salt water. The discomfort should disappear within 24 hours.  If you had a scopolamine patch placed behind your ear for the management of post- operative nausea and/or vomiting:  1. The medication in the patch is effective for 72 hours, after which it should be removed.  Wrap patch in a tissue and discard in the trash. Wash hands thoroughly with soap and water. 2. You may remove the patch earlier than 72 hours if you experience unpleasant side effects which may include dry mouth, dizziness or visual disturbances. 3. Avoid touching the patch. Wash your hands with soap and water after contact with the patch.    For further questions, please visit [email or web address]

## 2022-04-30 NOTE — Anesthesia Preprocedure Evaluation (Addendum)
Anesthesia Evaluation  Patient identified by MRN, date of birth, ID band Patient awake    Reviewed: Allergy & Precautions, NPO status , Patient's Chart, lab work & pertinent test results  History of Anesthesia Complications (+) PONV and history of anesthetic complications  Airway Mallampati: I  TM Distance: >3 FB Neck ROM: Full    Dental  (+) Teeth Intact, Dental Advisory Given   Pulmonary neg pulmonary ROS   breath sounds clear to auscultation       Cardiovascular negative cardio ROS  Rhythm:Regular     Neuro/Psych negative neurological ROS  negative psych ROS   GI/Hepatic negative GI ROS, Neg liver ROS,,,  Endo/Other  negative endocrine ROS    Renal/GU negative Renal ROS     Musculoskeletal negative musculoskeletal ROS (+)    Abdominal   Peds  Hematology negative hematology ROS (+)   Anesthesia Other Findings   Reproductive/Obstetrics Lab Results      Component                Value               Date                      PREGTESTUR               NEGATIVE            04/30/2022                HCG                      <5.0                06/04/2019                                        Anesthesia Physical Anesthesia Plan  ASA: 2  Anesthesia Plan: General and Regional   Post-op Pain Management: Regional block* and Tylenol PO (pre-op)*   Induction: Intravenous  PONV Risk Score and Plan: 4 or greater and Ondansetron, Dexamethasone, Propofol infusion, TIVA and Midazolam  Airway Management Planned: LMA  Additional Equipment: None  Intra-op Plan:   Post-operative Plan: Extubation in OR  Informed Consent: I have reviewed the patients History and Physical, chart, labs and discussed the procedure including the risks, benefits and alternatives for the proposed anesthesia with the patient or authorized representative who has indicated his/her understanding and acceptance.      Dental advisory given  Plan Discussed with: CRNA  Anesthesia Plan Comments:        Anesthesia Quick Evaluation

## 2022-05-01 ENCOUNTER — Encounter (HOSPITAL_BASED_OUTPATIENT_CLINIC_OR_DEPARTMENT_OTHER): Payer: Self-pay | Admitting: Surgery

## 2022-05-04 ENCOUNTER — Encounter: Payer: Self-pay | Admitting: Surgery

## 2022-05-04 LAB — SURGICAL PATHOLOGY

## 2022-05-06 ENCOUNTER — Other Ambulatory Visit: Payer: Self-pay | Admitting: Hematology and Oncology

## 2022-05-06 ENCOUNTER — Telehealth: Payer: Self-pay | Admitting: Hematology and Oncology

## 2022-05-06 NOTE — Telephone Encounter (Signed)
Spoke with patient confirming upcoming appointment  

## 2022-05-06 NOTE — Anesthesia Postprocedure Evaluation (Signed)
Anesthesia Post Note  Patient: Stephanie [name]  Procedure(s) Performed: LEFT BREAST LUMPECTOMY WITH RADIOACTIVE SEED AND SENTINEL LYMPH NODE BIOPSY (Left: Breast)     Patient location during evaluation: PACU Anesthesia Type: Regional and General Level of consciousness: awake and alert Pain management: pain level controlled Vital Signs Assessment: post-procedure vital signs reviewed and stable Respiratory status: spontaneous breathing, nonlabored ventilation, respiratory function stable and patient connected to nasal cannula oxygen Cardiovascular status: blood pressure returned to baseline and stable Postop Assessment: no apparent nausea or vomiting Anesthetic complications: no  No notable events documented.  Last Vitals:  Vitals:   04/30/22 1500 04/30/22 1538  BP: 115/81 118/62  Pulse: 74 72  Resp: (!) 9 16  Temp:  36.6 C  SpO2: 99% 99%    Last Pain:  Vitals:   04/30/22 1538  TempSrc: Oral                 Spirit Wernli L Shanta Dorvil

## 2022-05-07 ENCOUNTER — Telehealth: Payer: Self-pay | Admitting: Radiation Oncology

## 2022-05-07 ENCOUNTER — Encounter: Payer: Self-pay | Admitting: *Deleted

## 2022-05-07 DIAGNOSIS — C50412 Malignant neoplasm of upper-outer quadrant of left female breast: Secondary | ICD-10-CM

## 2022-05-07 NOTE — Telephone Encounter (Signed)
4/25 @ 11:29 am Left voicemail for patient to call our office to be schedule for consult with Dr. Basilio Cairo.

## 2022-05-08 ENCOUNTER — Other Ambulatory Visit: Payer: Self-pay

## 2022-05-09 ENCOUNTER — Other Ambulatory Visit: Payer: Self-pay

## 2022-05-12 NOTE — Progress Notes (Signed)
Location of Breast Cancer:Malignant neoplasm of upper-outer quadrant of left breast in female, estrogen receptor positive (HCC)  Histology per Pathology Report:  04-30-22 FINAL MICROSCOPIC DIAGNOSIS:  A. BREAST, LEFT, LUMPECTOMY: - No evidence of residual invasive ductal carcinoma, ypT0N0 (RCB-0) - All margins negative for carcinoma in situ or invasive carcinoma - Treatment effect present  B. BREAST, LEFT SUPERIOR MARGIN, EXCISION: - Negative for carcinoma in situ or invasive carcinoma  C. BREAST, LEFT INFERIOR MARGIN, EXCISION: - Negative for carcinoma in situ or invasive carcinoma  D. BREAST, LEFT LATERAL MARGIN, EXCISION: - Negative for carcinoma in situ or invasive carcinoma  E. BREAST, LEFT MEDIAL MARGIN, EXCISION: - Negative for carcinoma in situ or invasive carcinoma  F. BREAST, LEFT POSTERIOR MARGIN, EXCISION: - Negative for carcinoma in situ or invasive carcinoma  G. BERAST, LEFT ADDITIONAL SKIN, EXCISION: - Negative for carcinoma in situ or invasive carcinoma  H. BREAST CAPSULE, LEFT: - Benign fibroadipose tissue consistent with breast capsule  I. LYMPH NODE, LEFT AXILLARY, SENTINEL, EXCISION: - Negative for carcinoma  Receptor Status: ER(70%), PR (0%), Her2-neu (3+), Ki-67(80%)  Did patient present with symptoms (if so, please note symptoms) or was this found on screening mammography?: palpable mass  Past/Anticipated interventions by surgeon, if any: 04-30-22 Procedure: Left breast seed localized lumpectomy with deep left axillary sentinel lymph node mapping using methylene blue dye and mag trace   Surgeon: [name], MD  History of Present Illness: Stephanie [name] is a 37 y.o. female who is seen today for follow-up after recurrent left breast cancer after bilateral nipple sparing mastectomies in 2021. She was seen last October after mass recurred in the left upper outer quadrant. Core biopsy of this was invasive ductal carcinoma grade 3 ER positive PR negative  HER2/neu negative with AKI 67 of 80%. She received adjuvant therapy and has finished that. Repeat MRI shows about a 50% reduction in size of the area of enhancement without lymphadenopathy. She is here today to discuss surgical treatment. She is done fairly well chemotherapy but her dose was reduced due to toxicities of diarrhea..   Past/Anticipated interventions by medical oncology, if any:  Dr. Al Pimple on 03-13-22 SUMMARY OF ONCOLOGIC HISTORY:    Oncology History  Malignant neoplasm of upper-outer quadrant of female breast (HCC) (Resolved)  10/20/2018 Initial Diagnosis    Malignant neoplasm of upper-outer quadrant of left breast in female, estrogen receptor positive (HCC)    10/20/2018 Cancer Staging    Staging form: Breast, AJCC 8th Edition - Clinical stage from 10/20/2018: Stage 0 (cTis (DCIS), cN0, cM0, ER+, PR-) - Signed by Lowella Dell, MD on 02/13/2020 Stage prefix: Initial diagnosis    11/03/2018 Genetic Testing    Negative genetic testing:  No pathogenic variants detected on the [facility] Multi-Cancer panel, ordered by Dr. Juliene Pina at [facility]. The report date is 11/03/2018.   The Multi-Cancer Panel offered by [facility] includes sequencing and/or deletion duplication testing of the following 84 genes: AIP, ALK, APC, ATM, AXIN2,BAP1,  BARD1, BLM, BMPR1A, BRCA1, BRCA2, BRIP1, CASR, CDC73, CDH1, CDK4, CDKN1B, CDKN1C, CDKN2A (p14ARF), CDKN2A (p16INK4a), CEBPA, CHEK2, CTNNA1, DICER1, DIS3L2, EGFR (c.2369C>T, p.Thr790Met variant only), EPCAM (Deletion/duplication testing only), FH, FLCN, GATA2, GPC3, GREM1 (Promoter region deletion/duplication testing only), HOXB13 (c.251G>A, p.Gly84Glu), HRAS, KIT, MAX, MEN1, MET, MITF (c.952G>A, p.Glu318Lys variant only), MLH1, MSH2, MSH3, MSH6, MUTYH, NBN, NF1, NF2, NTHL1, PALB2, PDGFRA, PHOX2B, PMS2, POLD1, POLE, POT1, PRKAR1A, PTCH1, PTEN, RAD50, RAD51C, RAD51D, RB1, RECQL4, RET, RUNX1, SDHAF2, SDHA (sequence changes only), SDHB, SDHC,  SDHD,  SMAD4, SMARCA4, SMARCB1, SMARCE1, STK11, SUFU, TERC, TERT, TMEM127, TP53, TSC1, TSC2, VHL, WRN and WT1.      01/17/2019 Cancer Staging    Staging form: Breast, AJCC 8th Edition - Pathologic stage from 01/17/2019: Stage IA (pT28mi, pN0, cM0, G2, ER+, PR-, HER2-) - Signed by Loa Socks, NP on 02/01/2019    Malignant neoplasm of upper-outer quadrant of left breast in female, estrogen receptor positive (HCC)  10/21/2021 Breast US    Breast ultrasound of the palpable mass showed hypoechoic oval vascular mass with indistinct margins measuring 1.5 x 1 cm x 1.4 cm.  No abnormal lymph nodes found in the left axilla.  She had ultrasound-guided biopsy.  MRI scheduled for tomorrow.    10/29/2021 Pathology Results    Surgical pathology from the left breast needle core biopsy at 130 o'clock showed invasive ductal carcinoma, overall grade 2, prognostics ER 70% positive weak to moderate staining, PR 0% negative, Ki-67 of 80% and HER2 3+    11/18/2021 Initial Diagnosis    Malignant neoplasm of upper-outer quadrant of left breast in female, estrogen receptor positive (HCC)    11/18/2021 Cancer Staging    Staging form: Breast, AJCC 8th Edition - Clinical: Stage IA (cT1c, cN0, cM0, G2, ER+, PR-, HER2+) - Signed by Rachel Moulds, MD on 11/18/2021 Histologic grading system: 3 grade system    11/26/2021 -  Chemotherapy    Patient is on Treatment Plan : BREAST  Docetaxel + Carboplatin + Trastuzumab + Pertuzumab  (TCHP) q21d        ASSESSMENT AND PLAN: Stephanie [name] is a 37 y.o. female who presents to the clinic for follow up for left breast cancer.    #Malignant neoplasm of upper-outer quadrant of left breast: -Breast US from 10/21/2021 showed showed hypoechoic oval vascular mass with indistinct margins measuring 1.5 x 1 cm x 1.4 cm.  -Grade 2, ER +70% weak to moderate staining, PR 0% negative, HER2 3+ by IHC, Ki-67 of 80%  -Baseline breast MRI from 11/19/2021 showed tumor size measuring up to 3.6  cm. No regional adenopathy. CT CAP and bone scan from 12/03/2021 showed no evidence of metastatic disease. -Recommended neoadjuvant chemotherapy with TCHP, started on 11/26/2021 -Due for Cycle 6 Day 1 of TCHP.  -- Given multiple adverse effects which have been bothersome since her last visit, we have dose reduced the docetaxel to 60 mg/m and carboplatin to AUC of 5. She is tolerating this dose very well.  She is clinically asymptomatic from the concomitant COVID-19 infection with no systemic signs, hence we have agreed to proceed with treatment as planned.   #Diarrhea: --Grade 1 --Secondary to chemotherapy --Encouraged to use imodium with each episode and contact clinic if she has more than 4 episodes per day.    #Nausea without vomiting:  --Grade 1 --Secondary to chemotherapy --Encouraged to take prescribed antiemetics as directed.    #Acneiform rash: --She uses doxycyline as needed.   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  -Underwent bilateral nipple sparing mastectomies with left sentinel lymph node sampling .  -Pathology: (a) on the right, no evidence of malignancy (b) on the left, pT16mi pN0, stage IA invasive carcinoma, grade 2, with negative margins          (i) four left axillary sentinel nodes removed          (ii) ductal carcinoma in situ, grade 3, also present, with negative margins          (iii)  insufficient invasive tissue for prognostic panel evaluation  -Patient opted against anti-estrogen therapy which was optional.  -Adjuvant radiation was not indicated.    # Lower extremity ankle swelling, mild today.  This could repeat related to taxane.  Will continue to monitor.   # COVID 19, mild sore throat, no fevers, chills, shortness of breath.  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   Lymphedema issues, if any:  none at this time  Pain issues, if any:  none to report  SAFETY ISSUES: Prior radiation? no Pacemaker/ICD? no Possible  current pregnancy? Negative pregnancy test on 04-30-22 Is the patient on methotrexate? no  Current Complaints / other details:  No major concerns or questions. Pt going to surgical follow up today, she has small concerns for scabs related to this. She denies pain or swelling at the site.

## 2022-05-13 ENCOUNTER — Other Ambulatory Visit: Payer: Self-pay

## 2022-05-14 ENCOUNTER — Inpatient Hospital Stay: Payer: BC Managed Care – PPO

## 2022-05-14 ENCOUNTER — Encounter: Payer: Self-pay | Admitting: Hematology and Oncology

## 2022-05-14 ENCOUNTER — Encounter: Payer: Self-pay | Admitting: Adult Health

## 2022-05-14 ENCOUNTER — Inpatient Hospital Stay: Payer: BC Managed Care – PPO | Attending: Hematology and Oncology | Admitting: Adult Health

## 2022-05-14 VITALS — BP 110/68 | HR 56 | Temp 97.7°F | Resp 18 | Ht 66.0 in | Wt 152.8 lb

## 2022-05-14 VITALS — BP 96/59 | HR 65 | Resp 16

## 2022-05-14 DIAGNOSIS — C50412 Malignant neoplasm of upper-outer quadrant of left female breast: Secondary | ICD-10-CM | POA: Diagnosis present

## 2022-05-14 DIAGNOSIS — Z170 Estrogen receptor positive status [ER+]: Secondary | ICD-10-CM

## 2022-05-14 DIAGNOSIS — Z79899 Other long term (current) drug therapy: Secondary | ICD-10-CM | POA: Diagnosis not present

## 2022-05-14 DIAGNOSIS — Z95828 Presence of other vascular implants and grafts: Secondary | ICD-10-CM | POA: Insufficient documentation

## 2022-05-14 DIAGNOSIS — Z5111 Encounter for antineoplastic chemotherapy: Secondary | ICD-10-CM | POA: Insufficient documentation

## 2022-05-14 DIAGNOSIS — Z9013 Acquired absence of bilateral breasts and nipples: Secondary | ICD-10-CM | POA: Insufficient documentation

## 2022-05-14 LAB — CBC WITH DIFFERENTIAL/PLATELET
Abs Immature Granulocytes: 0.02 10*3/uL (ref 0.00–0.07)
Basophils Absolute: 0 10*3/uL (ref 0.0–0.1)
Basophils Relative: 0 %
Eosinophils Absolute: 0.2 10*3/uL (ref 0.0–0.5)
Eosinophils Relative: 3 %
HCT: 36.7 % (ref 36.0–46.0)
Hemoglobin: 12 g/dL (ref 12.0–15.0)
Immature Granulocytes: 0 %
Lymphocytes Relative: 26 %
Lymphs Abs: 1.7 10*3/uL (ref 0.7–4.0)
MCH: 28.4 pg (ref 26.0–34.0)
MCHC: 32.7 g/dL (ref 30.0–36.0)
MCV: 86.8 fL (ref 80.0–100.0)
Monocytes Absolute: 0.3 10*3/uL (ref 0.1–1.0)
Monocytes Relative: 5 %
Neutro Abs: 4.2 10*3/uL (ref 1.7–7.7)
Neutrophils Relative %: 66 %
Platelets: 222 10*3/uL (ref 150–400)
RBC: 4.23 MIL/uL (ref 3.87–5.11)
RDW: 12.6 % (ref 11.5–15.5)
WBC: 6.4 10*3/uL (ref 4.0–10.5)
nRBC: 0 % (ref 0.0–0.2)

## 2022-05-14 LAB — COMPREHENSIVE METABOLIC PANEL
ALT: 10 U/L (ref 0–44)
AST: 15 U/L (ref 15–41)
Albumin: 4.4 g/dL (ref 3.5–5.0)
Alkaline Phosphatase: 66 U/L (ref 38–126)
Anion gap: 6 (ref 5–15)
BUN: 17 mg/dL (ref 6–20)
CO2: 28 mmol/L (ref 22–32)
Calcium: 9.4 mg/dL (ref 8.9–10.3)
Chloride: 106 mmol/L (ref 98–111)
Creatinine, Ser: 0.67 mg/dL (ref 0.44–1.00)
GFR, Estimated: >60 mL/min (ref >60)
Glucose, Bld: 97 mg/dL (ref 70–99)
Potassium: 3.9 mmol/L (ref 3.5–5.1)
Sodium: 140 mmol/L (ref 135–145)
Total Bilirubin: 0.4 mg/dL (ref 0.3–1.2)
Total Protein: 6.9 g/dL (ref 6.5–8.1)

## 2022-05-14 MED ORDER — DIPHENHYDRAMINE HCL 25 MG PO CAPS
50.0000 mg | ORAL_CAPSULE | Freq: Once | ORAL | Status: AC
  Administered 2022-05-14: 50 mg via ORAL
  Filled 2022-05-14: qty 2

## 2022-05-14 MED ORDER — HEPARIN SOD (PORK) LOCK FLUSH 100 UNIT/ML IV SOLN
500.0000 [IU] | Freq: Once | INTRAVENOUS | Status: AC | PRN
  Administered 2022-05-14: 500 [IU]

## 2022-05-14 MED ORDER — TRASTUZUMAB-ANNS CHEMO 150 MG IV SOLR
6.0000 mg/kg | Freq: Once | INTRAVENOUS | Status: AC
  Administered 2022-05-14: 420 mg via INTRAVENOUS
  Filled 2022-05-14: qty 20

## 2022-05-14 MED ORDER — SODIUM CHLORIDE 0.9 % IV SOLN: Freq: Once | INTRAVENOUS | Status: AC

## 2022-05-14 MED ORDER — SODIUM CHLORIDE 0.9% FLUSH
10.0000 mL | Freq: Once | INTRAVENOUS | Status: AC
  Administered 2022-05-14: 10 mL

## 2022-05-14 MED ORDER — SODIUM CHLORIDE 0.9% FLUSH
10.0000 mL | INTRAVENOUS | Status: DC | PRN
  Administered 2022-05-14: 10 mL

## 2022-05-14 MED ORDER — SODIUM CHLORIDE 0.9 % IV SOLN
420.0000 mg | Freq: Once | INTRAVENOUS | Status: AC
  Administered 2022-05-14: 420 mg via INTRAVENOUS
  Filled 2022-05-14: qty 14

## 2022-05-14 MED ORDER — ACETAMINOPHEN 325 MG PO TABS
650.0000 mg | ORAL_TABLET | Freq: Once | ORAL | Status: AC
  Administered 2022-05-14: 650 mg via ORAL
  Filled 2022-05-14: qty 2

## 2022-05-14 NOTE — Progress Notes (Signed)
[facility] Cancer Follow up:    No primary care provider on file. No primary provider on file.   DIAGNOSIS:  Cancer Staging  Malignant neoplasm of upper-outer quadrant of left breast in female, estrogen receptor positive (HCC) Staging form: Breast, AJCC 8th Edition - Clinical: Stage IA (cT1c, cN0, cM0, G2, ER+, PR-, HER2+) - Signed by Stephanie Moulds, MD on 11/18/2021 Histologic grading system: 3 grade system   SUMMARY OF ONCOLOGIC HISTORY: Oncology History  Malignant neoplasm of upper-outer quadrant of female breast (HCC) (Resolved)  10/20/2018 Initial Diagnosis   Malignant neoplasm of upper-outer quadrant of left breast in female, estrogen receptor positive (HCC)   10/20/2018 Cancer Staging   Staging form: Breast, AJCC 8th Edition - Clinical stage from 10/20/2018: Stage 0 (cTis (DCIS), cN0, cM0, ER+, PR-) - Signed by Stephanie Dell, MD on 02/13/2020 Stage prefix: Initial diagnosis   11/03/2018 Genetic Testing   Negative genetic testing:  No pathogenic variants detected on the [facility] Multi-Cancer panel, ordered by Dr. Juliene [name] at [facility]. The report date is 11/03/2018.  The Multi-Cancer Panel offered by [facility] includes sequencing and/or deletion duplication testing of the following 84 genes: AIP, ALK, APC, ATM, AXIN2,BAP1,  BARD1, BLM, BMPR1A, BRCA1, BRCA2, BRIP1, CASR, CDC73, CDH1, CDK4, CDKN1B, CDKN1C, CDKN2A (p14ARF), CDKN2A (p16INK4a), CEBPA, CHEK2, CTNNA1, DICER1, DIS3L2, EGFR (c.2369C>T, p.Thr790Met variant only), EPCAM (Deletion/duplication testing only), FH, FLCN, GATA2, GPC3, GREM1 (Promoter region deletion/duplication testing only), HOXB13 (c.251G>A, p.Gly84Glu), HRAS, KIT, MAX, MEN1, MET, MITF (c.952G>A, p.Glu318Lys variant only), MLH1, MSH2, MSH3, MSH6, MUTYH, NBN, NF1, NF2, NTHL1, PALB2, PDGFRA, PHOX2B, PMS2, POLD1, POLE, POT1, PRKAR1A, PTCH1, PTEN, RAD50, RAD51C, RAD51D, RB1, RECQL4, RET, RUNX1, SDHAF2, SDHA (sequence changes only), SDHB,  SDHC, SDHD, SMAD4, SMARCA4, SMARCB1, SMARCE1, STK11, SUFU, TERC, TERT, TMEM127, TP53, TSC1, TSC2, VHL, WRN and WT1.     01/17/2019 Cancer Staging   Staging form: Breast, AJCC 8th Edition - Pathologic stage from 01/17/2019: Stage IA (pT73mi, pN0, cM0, G2, ER+, PR-, HER2-) - Signed by Stephanie Socks, NP on 02/01/2019   Malignant neoplasm of upper-outer quadrant of left breast in female, estrogen receptor positive (HCC)  10/21/2021 Breast US   Breast ultrasound of the palpable mass showed hypoechoic oval vascular mass with indistinct margins measuring 1.5 x 1 cm x 1.4 cm.  No abnormal lymph nodes found in the left axilla.  She had ultrasound-guided biopsy.  MRI scheduled for tomorrow.   10/29/2021 Pathology Results   Surgical pathology from the left breast needle core biopsy at 130 o'clock showed invasive ductal carcinoma, overall grade 2, prognostics ER 70% positive weak to moderate staining, PR 0% negative, Ki-67 of 80% and HER2 3+   11/18/2021 Cancer Staging   Staging form: Breast, AJCC 8th Edition - Clinical: Stage IA (cT1c, cN0, cM0, G2, ER+, PR-, HER2+) - Signed by Stephanie Moulds, MD on 11/18/2021 Histologic grading system: 3 grade system   11/26/2021 - 3/4/[name] Chemotherapy   Patient is on Treatment Plan : BREAST  Docetaxel + Carboplatin + Trastuzumab + Pertuzumab  (TCHP) q21d      3/22/[name] -  Chemotherapy   Patient is on Treatment Plan : BREAST Trastuzumab + Pertuzumab q21d x 11 cycles     4/18/[name] Surgery   Left breast lumpectomy: no residual cancer identified, margins negative, treatment effect present, 1SLN negative for cancer.     CURRENT THERAPY: Hercpetin, Perjeta  INTERVAL HISTORY: Stephanie [name] 37 y.o. female returns for follow-up of her left-sided breast cancer.  Since her last  visit with [place] she underwent a left breast lumpectomy with Stephanie [name] on Stephanie [name] that demonstrated no residual cancer, margins were negative, treatment effect was noted and 1  sentinel lymph node was negative for cancer.  Her most recent echocardiogram occurred on February 21, [name] demonstrating a left ventricular ejection fraction of 60 to 65%.  Stephanie [name] is doing well today.  She is very happy about her surgery results indicating no residual carcinoma.  She is healing well from surgery and notes some slight swelling in her posterior axilla.  She denies any significant pain and has upcoming evaluation to discuss adjuvant radiation with Dr. Luane [name] on May 14.  Wants to know if she can take a multivitamin she was previously taking B12 and magnesium because of muscle achiness.   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  MEDICAL HISTORY: Past Medical History:  Diagnosis Date   Atypical mole 03/14/2019   mod-left side superior   BRCA gene mutation negative in female    Breast cancer [facility]    Family history of breast cancer    Family history of lung cancer    Family history of rectal cancer    Melanoma (HCC) 10/14/2007   level II- right arm- (EXC)   PONV (postoperative nausea and vomiting)     SURGICAL HISTORY: Past Surgical History:  Procedure Laterality Date   BREAST BIOPSY Left 4/17/[name]   [place] LT RADIOACTIVE SEED LOC 4/17/[name] [facility] MAMMOGRAPHY   BREAST LUMPECTOMY WITH RADIOACTIVE SEED AND SENTINEL LYMPH NODE BIOPSY Left 4/18/[name]   Procedure: LEFT BREAST LUMPECTOMY WITH RADIOACTIVE SEED AND SENTINEL LYMPH NODE BIOPSY;  Surgeon: Stephanie Bouillon, MD;  [facility];   Service: General;  Laterality: Left;   BREAST RECONSTRUCTION WITH PLACEMENT OF TISSUE EXPANDER AND ALLODERM Bilateral 01/17/2019   Procedure: BILATERAL BREAST RECONSTRUCTION WITH PLACEMENT OF TISSUE EXPANDER AND ALLODERM;  Surgeon: Glenna Fellows, MD;  Location: [facility];  Service: Plastics;  Laterality: Bilateral;   IR IMAGING GUIDED PORT INSERTION  11/25/2021   IR RADIOLOGIST EVAL & MGMT  11/26/2021   LESION REMOVAL Left 06/02/2019   Procedure: MINOR EXICISION OF LESION,LAYERED CLOSURE 1 CM;  Surgeon: Glenna Fellows, MD;  Location: [facility];  Service: Plastics;  Laterality: Left;   LIPOSUCTION WITH LIPOFILLING Bilateral 06/02/2019   Procedure: LIPOFILLING FROM ABDOMEN TO BILATERAL CHEST;  Surgeon: Glenna Fellows, MD;  Location: [facility];  Service: Plastics;  Laterality: Bilateral;   melanoma surgery Right 2009   upper right arm   NIPPLE SPARING MASTECTOMY WITH SENTINEL LYMPH NODE BIOPSY Bilateral 01/17/2019   Procedure: BILATERAL NIPPLE SPARING MASTECTOMIES WITH LEFT SENTINEL LYMPH NODE MAPPING;  Surgeon: Stephanie Bouillon, MD;  Location:  SURGERY CENTER;  Service: General;  Laterality: Bilateral;   REMOVAL OF BILATERAL TISSUE EXPANDERS WITH PLACEMENT OF BILATERAL BREAST IMPLANTS Bilateral 06/02/2019   Procedure: REMOVAL OF BILATERAL TISSUE EXPANDERS WITH PLACEMENT OF BILATERAL SILICONE BREAST IMPLANTS;  Surgeon: Glenna Fellows, MD;  Location: [facility];  Service: Plastics;  Laterality: Bilateral;   WISDOM TOOTH EXTRACTION      SOCIAL HISTORY: Social History   Socioeconomic History   Marital status: Married    Spouse name: Not on file   Number of children: Not on file   Years of education: Not on file   Highest education level: Not on file  Occupational History   Not on file  Tobacco Use   Smoking status: Never   Smokeless tobacco: Never  Vaping Use   Vaping Use: Never used  Substance and Sexual  Activity   Alcohol use: Yes    Comment: occasional   Drug use: Never   Sexual activity: Yes    Birth control/protection: None  Other Topics Concern   Not on file  Social History Narrative   Not on file   Social Determinants of Health   Financial Resource Strain: Not on file  Food Insecurity: Not on file  Transportation Needs: No Transportation Needs (11/09/2018)   PRAPARE - Transportation    Lack of Transportation (Medical): No    Lack of Transportation (Non-Medical): No  Physical Activity: Not on file  Stress: Not on file  Social Connections: Not on file  Intimate Partner Violence: Not At Risk (11/09/2018)   Humiliation, Afraid, Rape, and Kick questionnaire    Fear of Current or Ex-Partner: No    Emotionally Abused: No    Physically Abused: No    Sexually Abused: No    FAMILY HISTORY: Family History  Problem Relation Age of Onset   Hypertension Mother    Heart disease Father    Breast cancer Maternal Aunt 11 - 69   Breast cancer Maternal Aunt 31 - 59   Lung cancer Paternal Aunt        diagnosed late 55s   Breast cancer Maternal Grandmother 10 - 69   Breast cancer Paternal Grandmother 71 - 48   Lung cancer Paternal Grandmother        thought to be breast cancer mets   Rectal cancer Paternal Grandmother        diagnosed 14s   Lung cancer Paternal Grandfather        diagnosed 39s    Review of Systems  Constitutional:  Positive for fatigue (Mild). Negative for appetite change, chills, fever and unexpected weight change.  HENT:   Negative for hearing loss, lump/mass and trouble swallowing.   Eyes:  Negative for eye problems and icterus.  Respiratory:  Negative for chest tightness, cough and shortness of breath.   Cardiovascular:  Negative for chest pain, leg swelling and palpitations.  Gastrointestinal:  Negative for abdominal distention, abdominal pain, constipation, diarrhea, nausea and vomiting.  Endocrine: Negative for hot flashes.  Genitourinary:  Negative  for difficulty urinating.   Musculoskeletal:  Negative for arthralgias.  Skin:  Negative for itching and rash.  Neurological:  Negative for dizziness, extremity weakness, headaches and numbness.  Hematological:  Negative for adenopathy. Does not bruise/bleed easily.  Psychiatric/Behavioral:  Negative for depression. The patient is not nervous/anxious.       PHYSICAL EXAMINATION   Onc Performance Status - 05/14/22 1236       ECOG Perf Status   ECOG Perf Status Fully active, able to carry on all pre-disease performance without restriction      KPS SCALE   KPS % SCORE Normal, no compliants, no evidence of disease             Vitals:   05/14/22 1233  BP: 110/68  Pulse: (!) 56  Resp: 18  Temp: 97.7 F (36.5 C)  SpO2: 100%    Physical Exam Constitutional:      General: She is not in acute distress.    Appearance: Normal appearance. She is not toxic-appearing.  HENT:     Head: Normocephalic and atraumatic.  Eyes:     General: No scleral icterus. Cardiovascular:     Rate and Rhythm: Normal rate and regular rhythm.     Pulses: Normal pulses.     Heart sounds: Normal heart sounds.  Pulmonary:     Effort: Pulmonary effort is normal.     Breath sounds: Normal breath sounds.  Chest:     Comments: Left axilla status post lymph node biopsy: Incision is clean dry and healing with Dermabond covering it somewhat.  There is slight swelling in her posterior axilla with minimal fluctuance.  She is also status post left breast lumpectomy mild erythema surrounding no tenderness or warmth.  No swelling. Abdominal:     General: Abdomen is flat. Bowel sounds are normal. There is no distension.     Palpations: Abdomen is soft.     Tenderness: There is no abdominal tenderness.  Musculoskeletal:        General: No swelling.     Cervical back: Neck supple.  Lymphadenopathy:     Cervical: No cervical adenopathy.  Skin:    General: Skin is warm and dry.     Findings: No rash.   Neurological:     General: No focal deficit present.     Mental Status: She is alert.  Psychiatric:        Mood and Affect: Mood normal.        Behavior: Behavior normal.     LABORATORY DATA:  CBC    Component Value Date/Time   WBC 6.4 05/02/[name] 1201   RBC 4.23 05/02/[name] 1201   HGB 12.0 05/02/[name] 1201   HGB 10.1 (L) 03/01/[name] 1210   HCT 36.7 05/02/[name] 1201   PLT 222 05/02/[name] 1201   PLT 196 03/01/[name] 1210   MCV 86.8 05/02/[name] 1201   MCH 28.4 05/02/[name] 1201   MCHC 32.7 05/02/[name] 1201   RDW 12.6 05/02/[name] 1201   LYMPHSABS PENDING 05/02/[name] 1201   MONOABS PENDING 05/02/[name] 1201   EOSABS PENDING 05/02/[name] 1201   BASOSABS PENDING 05/02/[name] 1201    CMP     Component Value Date/Time   NA 140 05/02/[name] 1201   K 3.9 05/02/[name] 1201   CL 106 05/02/[name] 1201   CO2 28 05/02/[name] 1201   GLUCOSE 97 05/02/[name] 1201   BUN 17 05/02/[name] 1201   CREATININE 0.67 05/02/[name] 1201   CREATININE 0.79 03/01/[name] 1210   CALCIUM 9.4 05/02/[name] 1201   PROT 6.9 05/02/[name] 1201   ALBUMIN 4.4 05/02/[name] 1201   AST 15 05/02/[name] 1201   AST 18 03/01/[name] 1210   ALT 10 05/02/[name] 1201   ALT 12 03/01/[name] 1210   ALKPHOS 66 05/02/[name] 1201   BILITOT 0.4 05/02/[name] 1201   BILITOT 0.4 03/01/[name] 1210   GFRNONAA >60 05/02/[name] 1201   GFRNONAA >60 03/01/[name] 1210   GFRAA >60 06/04/2019 0201      ASSESSMENT and THERAPY PLAN:   Malignant neoplasm of upper-outer quadrant of left breast in female, estrogen receptor positive (HCC) Stephanie [name] is a 37 year old woman with recurrent left-sided stage Ia ER HER2 positive breast cancer diagnosed in October 2023.  She is status post neoadjuvant chemotherapy, lumpectomy, and continues on maintenance Herceptin/Perjeta.  Stage IA ER/HER2 positive left breast IDC: We reviewed her lumpectomy pathology results which demonstrate no residual carcinoma.  We discussed how this is associated with better prognosis and outcomes.  Her labs thus far are  stable and she will proceed with Herceptin Perjeta today.  She is due for repeat echocardiogram and I have placed orders for this to occur. Left axillary swelling: This is minimal.  She will monitor it for now.  She has follow-up with Stephanie [name] on May 13. She is due for adjuvant radiation therapy and will meet with Dr. Basilio Cairo on May 14.  We will see Stephanie [name] back every 3 weeks for Herceptin Perjeta.  She knows to call for any questions or concerns that may arise between now and her next appointment.   All questions were answered. The patient knows to call the clinic with any problems, questions or concerns. We can certainly see the patient much sooner if necessary.  Total encounter time:30 minutes*in face-to-face visit time, chart review, lab review, care coordination, order entry, and documentation of the encounter time.  Lillard Anes, NP 05/14/22 1:06 PM Medical Oncology and Hematology [facility] [address] [place], [place] [address] Tel. [phone]    Fax. [phone]  *Total Encounter Time as defined by the Centers for Medicare and Medicaid Services includes, in addition to the face-to-face time of a patient visit (documented in the note above) non-face-to-face time: obtaining and reviewing outside history, ordering and reviewing medications, tests or procedures, care coordination (communications with other health care professionals or caregivers) and documentation in the medical record.

## 2022-05-14 NOTE — Patient Instructions (Signed)
[facility]  Discharge Instructions: Thank you for choosing [facility] to provide your oncology and hematology care.   If you have a lab appointment with the Cancer Center, please go directly to the Cancer Center and check in at the registration area.   Wear comfortable clothing and clothing appropriate for easy access to any Portacath or PICC line.   We strive to give you quality time with your provider. You may need to reschedule your appointment if you arrive late (15 or more minutes).  Arriving late affects you and other patients whose appointments are after yours.  Also, if you miss three or more appointments without notifying the office, you may be dismissed from the clinic at the provider's discretion.      For prescription refill requests, have your pharmacy contact our office and allow 72 hours for refills to be completed.    Today you received the following chemotherapy and/or immunotherapy agents: Kanjinti/Perjeta   To help prevent nausea and vomiting after your treatment, we encourage you to take your nausea medication as directed.  BELOW ARE SYMPTOMS THAT SHOULD BE REPORTED IMMEDIATELY: *FEVER GREATER THAN 100.4 F (38 C) OR HIGHER *CHILLS OR SWEATING *NAUSEA AND VOMITING THAT IS NOT CONTROLLED WITH YOUR NAUSEA MEDICATION *UNUSUAL SHORTNESS OF BREATH *UNUSUAL BRUISING OR BLEEDING *URINARY PROBLEMS (pain or burning when urinating, or frequent urination) *BOWEL PROBLEMS (unusual diarrhea, constipation, pain near the anus) TENDERNESS IN MOUTH AND THROAT WITH OR WITHOUT PRESENCE OF ULCERS (sore throat, sores in mouth, or a toothache) UNUSUAL RASH, SWELLING OR PAIN  UNUSUAL VAGINAL DISCHARGE OR ITCHING   Items with * indicate a potential emergency and should be followed up as soon as possible or go to the Emergency Department if any problems should occur.  Please show the CHEMOTHERAPY ALERT CARD or IMMUNOTHERAPY ALERT CARD at  check-in to the Emergency Department and triage nurse.  Should you have questions after your visit or need to cancel or reschedule your appointment, please contact [facility]  Dept: [phone]  and follow the prompts.  Office hours are 8:00 a.m. to 4:30 p.m. Monday - Friday. Please note that voicemails left after 4:00 p.m. may not be returned until the following business day.  We are closed weekends and major holidays. You have access to a nurse at all times for urgent questions. Please call the main number to the clinic Dept: [phone] and follow the prompts.   For any non-urgent questions, you may also contact your provider using MyChart. We now offer e-Visits for anyone 18 and older to request care online for non-urgent symptoms. For details visit [email or web address].   Also download the MyChart app! Go to the app store, search "MyChart", open the app, select [facility], and log in with your MyChart username and password.   

## 2022-05-14 NOTE — Assessment & Plan Note (Signed)
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  She is status post neoadjuvant chemotherapy, lumpectomy, and continues on maintenance Herceptin/Perjeta.    Stage IA ER/HER2 positive left breast IDC: We reviewed her lumpectomy pathology results which demonstrate no residual carcinoma.  We discussed how this is associated with better prognosis and outcomes.  Her labs thus far are stable and she will proceed with Herceptin Perjeta today.  She is due for repeat echocardiogram and I have placed orders for this to occur. Left axillary swelling: This is minimal.  She will monitor it for now.  She has follow-up with Dr. Luisa Hart on May 13. She is due for adjuvant radiation therapy and will meet with Dr. Basilio Cairo on May 14.  We will see Stephanie [name] back every 3 weeks for Herceptin Perjeta.  She knows to call for any questions or concerns that may arise between now and her next appointment.

## 2022-05-15 ENCOUNTER — Other Ambulatory Visit: Payer: Self-pay

## 2022-05-21 ENCOUNTER — Other Ambulatory Visit: Payer: Self-pay

## 2022-05-22 ENCOUNTER — Ambulatory Visit: Payer: BC Managed Care – PPO | Attending: Hematology and Oncology | Admitting: Rehabilitation

## 2022-05-22 ENCOUNTER — Encounter: Payer: Self-pay | Admitting: Rehabilitation

## 2022-05-22 DIAGNOSIS — C50412 Malignant neoplasm of upper-outer quadrant of left female breast: Secondary | ICD-10-CM | POA: Insufficient documentation

## 2022-05-22 DIAGNOSIS — R293 Abnormal posture: Secondary | ICD-10-CM | POA: Insufficient documentation

## 2022-05-22 DIAGNOSIS — Z9189 Other specified personal risk factors, not elsewhere classified: Secondary | ICD-10-CM | POA: Diagnosis present

## 2022-05-22 DIAGNOSIS — Z170 Estrogen receptor positive status [ER+]: Secondary | ICD-10-CM | POA: Diagnosis present

## 2022-05-22 NOTE — Therapy (Signed)
OUTPATIENT PHYSICAL THERAPY BREAST CANCER POST OP FOLLOW UP   Patient Name: Stephanie [name] MRN: [id number] DOB:[birth year], 37 y.o., female Today's Date: 05/22/2022  END OF SESSION:  PT End of Session - 05/22/22 0854     Visit Number 2    Number of Visits 6    Date for PT Re-Evaluation 07/17/22    PT Start Time 0803    PT Stop Time 0830    PT Time Calculation (min) 27 min    Activity Tolerance Patient tolerated treatment well    Behavior During Therapy [facility] for tasks assessed/performed             Past Medical History:  Diagnosis Date   Atypical mole 03/14/2019   mod-left side superior   BRCA gene mutation negative in female    Breast cancer [facility])    Family history of breast cancer    Family history of lung cancer    Family history of rectal cancer    Melanoma (HCC) 10/14/2007   level II- right arm- (EXC)   PONV (postoperative nausea and vomiting)    Past Surgical History:  Procedure Laterality Date   BREAST BIOPSY Left 04/29/2022   [place] LT RADIOACTIVE SEED LOC 04/29/2022 [facility] MAMMOGRAPHY   BREAST LUMPECTOMY WITH RADIOACTIVE SEED AND SENTINEL LYMPH NODE BIOPSY Left 04/30/2022   Procedure: LEFT BREAST LUMPECTOMY WITH RADIOACTIVE SEED AND SENTINEL LYMPH NODE BIOPSY;  Surgeon: [name], MD;  Location: [facility];  Service: General;  Laterality: Left;   BREAST RECONSTRUCTION WITH PLACEMENT OF TISSUE EXPANDER AND ALLODERM Bilateral 01/17/2019   Procedure: BILATERAL BREAST RECONSTRUCTION WITH PLACEMENT OF TISSUE EXPANDER AND ALLODERM;  Surgeon: Glenna Fellows, MD;  Location: [facility];  Service: Plastics;  Laterality: Bilateral;   IR IMAGING GUIDED PORT INSERTION  11/25/2021   IR RADIOLOGIST EVAL & MGMT  11/26/2021   LESION REMOVAL Left 06/02/2019   Procedure: MINOR EXICISION OF LESION,LAYERED CLOSURE 1 CM;  Surgeon: Glenna Fellows, MD;  Location: [facility];  Service: Plastics;  Laterality: Left;   LIPOSUCTION WITH  LIPOFILLING Bilateral 06/02/2019   Procedure: LIPOFILLING FROM ABDOMEN TO BILATERAL CHEST;  Surgeon: Glenna Fellows, MD;  Location: [facility];  Service: Plastics;  Laterality: Bilateral;   melanoma surgery Right 2009   upper right arm   NIPPLE SPARING MASTECTOMY WITH SENTINEL LYMPH NODE BIOPSY Bilateral 01/17/2019   Procedure: BILATERAL NIPPLE SPARING MASTECTOMIES WITH LEFT SENTINEL LYMPH NODE MAPPING;  Surgeon: [name], MD;  Location: [facility];  Service: General;  Laterality: Bilateral;   REMOVAL OF BILATERAL TISSUE EXPANDERS WITH PLACEMENT OF BILATERAL BREAST IMPLANTS Bilateral 06/02/2019   Procedure: REMOVAL OF BILATERAL TISSUE EXPANDERS WITH PLACEMENT OF BILATERAL SILICONE BREAST IMPLANTS;  Surgeon: Glenna Fellows, MD;  Location: [facility];  Service: Plastics;  Laterality: Bilateral;   WISDOM TOOTH EXTRACTION     Patient Active Problem List   Diagnosis Date Noted   Port-A-Cath in place 05/14/2022   Malignant neoplasm of upper-outer quadrant of left breast in female, estrogen receptor positive (HCC) 11/18/2021   Family history of breast cancer    Family history of rectal cancer    Family history of lung cancer    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 Al Pimple  REFERRING DIAG: Left breast cancer  THERAPY DIAG:  Malignant neoplasm of upper-outer quadrant of left breast in female, estrogen receptor positive (HCC)  At risk for lymphedema  Abnormal posture  Rationale for Evaluation and Treatment: Rehabilitation  ONSET DATE: 10/29/21  SUBJECTIVE:                                                                                                                                                                                           SUBJECTIVE STATEMENT: I'm  always a little nervous before I start moving again but am doing pretty good.  My armpit incision is the one that bothers me.  I did a light weight class yesterday and it was okay.   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 She was treated for left breast cancer DCIS in 2020 where she underwent bilateral nipple sparing mastectomies with implants and Lt SLNB 4 negative nodes rermoved. No radiation completed. Pt is now done on TCHP. left lumpectomy with SLNB on 04/30/22 with 1 negative node removed.. Will also be having radiation.  No episodes of arm swelling.    PATIENT GOALS:  Reassess how my recovery is going related to arm function, pain, and swelling.  PAIN:  Are you having pain? None  PRECAUTIONS: Recent Surgery, left UE Lymphedema risk  ACTIVITY LEVEL / LEISURE: back to work and activity    OBJECTIVE:   PATIENT SURVEYS:  QUICK DASH: 9%  OBSERVATIONS: Incisions well healed.  Breast incision still with scabbing and glue present.   POSTURE:  Slightly guarded  LYMPHEDEMA ASSESSMENT:   UPPER EXTREMITY AROM/PROM:   A/PROM RIGHT   eval    Shoulder extension 60  Shoulder flexion 160  Shoulder abduction 165  Shoulder internal rotation    Shoulder external rotation 95                          (Blank rows = not tested)   A/PROM LEFT   eval 05/22/22  Shoulder extension 52 55  Shoulder flexion 163 153 - pull in axilla   Shoulder abduction 165 155 - pull in axilla   Shoulder internal rotation     Shoulder external rotation 90 90                          (Blank rows = not tested)   CERVICAL AROM: All within normal limits:    LYMPHEDEMA ASSESSMENTS:    LANDMARK RIGHT   eval  10 cm proximal to olecranon process 31.5  Olecranon process  25.5  10 cm proximal to ulnar styloid process 21.4  Just proximal to ulnar styloid process 15.1  Across hand at thumb web space 18.7  At base of  2nd digit 6.0  (Blank rows = not tested)   LANDMARK LEFT   eval  10 cm proximal to olecranon process 31.6  Olecranon process 26.5  10 cm proximal to ulnar styloid process 21.8  Just proximal to ulnar styloid process 15.5  Across hand at thumb web space 18.0  At base of 2nd digit 6.0  (Blank rows = not tested)  PATIENT EDUCATION:  Education details: post op information Person educated: Patient Education method: Programmer, multimedia, Demonstration, Tactile cues, Verbal cues, and Handouts Education comprehension: verbalized understanding and returned demonstration  HOME EXERCISE PROGRAM: Medbridge code lost but gave pt supine dowel flexion, chest stretch, wall walking flex and abd, and doorway stretch.   ASSESSMENT:  CLINICAL IMPRESSION: Pt is doing very well but is lacking 10 deg of flexion and abduction and has some limitations noted with lifting weights overhead.  She will wait until her radiation is scheduled and then have PT visits on the same days she is in town for radiation.   Pt will benefit from skilled therapeutic intervention to improve on the following deficits: Decreased knowledge of precautions, impaired UE functional use, pain, decreased ROM, postural dysfunction.   PT treatment/interventions: ADL/Self care home management, Therapeutic exercises, Patient/Family education, Self Care, DME instructions, Manual therapy, and Re-evaluation   GOALS: Goals reviewed with patient? Yes  LONG TERM GOALS:  (STG=LTG)  GOALS Name Target Date  Goal status  1 Pt will demonstrate she has regained full shoulder ROM and function post operatively compared to baselines.  Baseline: 07/17/22 IN PROGRESS  2 Pt will return to gym classes without limitations 07/17/22 INITIAL               PLAN:  PT FREQUENCY/DURATION: 1x per week x 4 weeks  PLAN FOR NEXT SESSION: Lt PROM, AAROM, STM to [facility]  [address]  [place] [address]  [phone]  After Breast Cancer Class It is recommended you attend the ABC class to be educated on lymphedema risk reduction. This class is free of charge and lasts for 1 hour. It is a 1-time class. You will need to download the TEAMS app either on your phone or computer. We will send you a link the night before or the morning of the class. You should be able to click on that link to join the class. This is not a confidential class. You don't have to turn your camera on, but other participants may be able to see your email address.  Scar massage You can begin gentle scar massage to you incision sites. Gently place one hand on the incision and move the skin (without sliding on the skin) in various directions. Do this for a few minutes and then you can gently massage either coconut oil or vitamin E cream into the scars.  Compression garment You should continue wearing your compression bra until you feel like you no longer have swelling.  Home exercise Program Continue doing the exercises you were given until you feel like you can do them without feeling any tightness at the end.   Walking Program Studies show that 30 minutes of walking per day (fast enough to elevate your heart rate) can significantly reduce the risk of a cancer recurrence. If you can't walk due to other medical  reasons, we encourage you to find another activity you could do (like a stationary bike or water exercise).  Posture After breast cancer surgery, people frequently sit with rounded shoulders posture because it puts their incisions on slack and feels better. If you sit like this and scar tissue forms in that position, you can become very tight and have pain sitting or standing with good posture. Try to be aware of your posture and sit and stand up tall to heal properly.  Follow up PT: It is recommended you return every 3 months for the first 3 years following surgery to be assessed on the SOZO machine for an L-Dex score. This  helps prevent clinically significant lymphedema in 95% of patients. These follow up screens are 10 minute appointments that you are not billed for.  Idamae Lusher, PT 05/22/2022, 8:54 AM

## 2022-05-25 ENCOUNTER — Encounter: Payer: Self-pay | Admitting: Radiation Oncology

## 2022-05-25 ENCOUNTER — Telehealth: Payer: Self-pay

## 2022-05-25 NOTE — Telephone Encounter (Signed)
Rn called to obtain meaningful use and nurse evaluation information. Note completed and routed to Dr. Basilio Cairo and Quitman Livings PA.

## 2022-05-25 NOTE — Progress Notes (Signed)
Radiation Oncology         [phone]) [phone] ________________________________  Name: Stephanie [name] MRN: [id number]  Date: 05/26/2022  DOB: 04/12/1985  Follow-Up Visit Note  Outpatient  CC: No primary care provider on file.  Rachel Moulds, MD  Diagnosis:   No diagnosis found.   No evidence of residual invasive ductal carcinoma s/p neoadjuvant chemotherapy and left breast lumpectomy          CHIEF COMPLAINT: Here to discuss management of left breast cancer  Narrative:  The patient returns today for follow-up.     Since her consultation date of 04/01/22, she has continued with Herceptin/Perjeta maintenance therapy (q3 weeks) under the care of Dr. Al Pimple. She continues to tolerate this well. Per recent visit's with medical oncology, she has reported having some myalgias mainly in her legs. She suspected that her pain could be related to magnesium deficiency and has started to take magnesium supplements with some improvement in her symptoms. She was also advised to start taking B12 supplements and hydrate on a regular basis.   She opted to proceed with a left breast lumpectomy with left axillary SLN biopsies on 04/30/22 under the care of Dr. Luisa Hart. Pathology from the procedure revealed: no evidence of residual invasive ductal carcinoma s/p neoadjuvant chemotherapy. Nodal status of 1/1 left axillary sentinel lymph nodes biopsy negative for carcinoma.   Symptomatically, the patient reports: ***        ALLERGIES:  is allergic to oxycodone.  Meds: Current Outpatient Medications  Medication Sig Dispense Refill   cetirizine (ZYRTEC) 10 MG tablet Take 10 mg by mouth daily.      cyanocobalamin 100 MCG tablet Take 100 mcg by mouth daily. (Patient not taking: Reported on 05/14/2022)     ibuprofen (ADVIL) 800 MG tablet Take 1 tablet (800 mg total) by mouth every 8 (eight) hours as needed. (Patient not taking: Reported on 05/14/2022) 30 tablet 0   magnesium 30 MG tablet Take 30 mg by mouth 2 (two) times  daily. (Patient not taking: Reported on 05/14/2022)     meclizine (ANTIVERT) 25 MG tablet Take 25 mg by mouth 3 (three) times daily as needed (Vertigo/Motion Sickness). (Patient not taking: Reported on 05/14/2022)     Multiple Vitamins-Minerals (MULTIVITAMIN WITH MINERALS) tablet Take 1 tablet by mouth daily. (Patient not taking: Reported on 05/14/2022)     No current facility-administered medications for this encounter.    Physical Findings:  vitals were not taken for this visit. .     General: Alert and oriented, in no acute distress HEENT: Head is normocephalic. Extraocular movements are intact. Oropharynx is clear. Neck: Neck is supple, no palpable cervical or supraclavicular lymphadenopathy. Heart: Regular in rate and rhythm with no murmurs, rubs, or gallops. Chest: Clear to auscultation bilaterally, with no rhonchi, wheezes, or rales. Abdomen: Soft, nontender, nondistended, with no rigidity or guarding. Extremities: No cyanosis or edema. Lymphatics: see Neck Exam Musculoskeletal: symmetric strength and muscle tone throughout. Neurologic: No obvious focalities. Speech is fluent.  Psychiatric: Judgment and insight are intact. Affect is appropriate. Breast exam reveals ***  Lab Findings: Lab Results  Component Value Date   WBC 6.4 05/14/2022   HGB 12.0 05/14/2022   HCT 36.7 05/14/2022   MCV 86.8 05/14/2022   PLT 222 05/14/2022    @LASTCHEMISTRY @  Radiographic Findings: MM Breast Surgical Specimen  Result Date: 04/30/2022 CLINICAL DATA:  37 year old with recurrence grade 2 invasive ductal carcinoma involving the UPPER OUTER QUADRANT of the reconstructed LEFT breast. Radioactive seed  localization was performed yesterday in anticipation of today's lumpectomy. EXAM: SPECIMEN RADIOGRAPH OF THE LEFT BREAST COMPARISON:  Previous exam(s). FINDINGS: Status post excision of the LEFT breast. The radioactive seed is present within the plastic container. The specimen radiograph does not  demonstrate the coil shaped tissue marking clip. This was discussed by telephone with the operating room nurse at the time of interpretation on 04/30/2022. IMPRESSION: Specimen radiograph of the LEFT breast. Electronically Signed   By: Hulan Saas M.D.   On: 04/30/2022 14:38  MM CLIP PLACEMENT LEFT  Result Date: 04/29/2022 CLINICAL DATA:  Mammogram status post ultrasound-guided radioactive seed placement for the left breast prior to lumpectomy. EXAM: DIAGNOSTIC LEFT MAMMOGRAM POST ULTRASOUND-GUIDED RADIOACTIVE SEED PLACEMENT COMPARISON:  Previous exam(s). FINDINGS: Mammographic images were obtained following ultrasound-guided radioactive seed placement. These demonstrate that the radioactive seed is immediately adjacent to the spiral shaped biopsy marking clip in the upper-outer left breast. Though the mass has nearly completely resolved, the extent of residual calcifications was measured for the surgeon (2.7 cm). IMPRESSION: Appropriate location of the radioactive seed in the upper-outer left breast. Final Assessment: Post Procedure Mammograms for Seed Placement Electronically Signed   By: Frederico Hamman M.D.   On: 04/29/2022 14:34  [place] LT RADIOACTIVE SEED LOC  Result Date: 04/29/2022 CLINICAL DATA:  37 year old female presenting for radioactive seed localization of the left breast prior to lumpectomy. EXAM: ULTRASOUND GUIDED RADIOACTIVE SEED LOCALIZATION OF THE LEFT BREAST COMPARISON:  Previous exam(s). FINDINGS: Patient presents for radioactive seed localization prior to left breast lumpectomy. I met with the patient and we discussed the procedure of seed localization including benefits and alternatives. We discussed the high likelihood of a successful procedure. We discussed the risks of the procedure including infection, bleeding, tissue injury and further surgery. We discussed the low dose of radioactivity involved in the procedure. Informed, written consent was given. The usual time-out  protocol was performed immediately prior to the procedure. Using ultrasound guidance, sterile technique, 1% lidocaine and an I-125 radioactive seed, the clip in the left breast at 1:30, 6 cm from the nipple was localized using an inferolateral approach. The follow-up mammogram images confirm the seed in the expected location and were marked for Dr. Luisa Hart. Follow-up survey of the patient confirms presence of the radioactive seed. Order number of I-125 seed:  [id number]. Total activity:  0.266 millicuries reference Date: 04/02/2022 The patient tolerated the procedure well and was [facility]. She was given instructions regarding seed removal. IMPRESSION: Radioactive seed localization left breast. No apparent complications. Electronically Signed   By: Frederico Hamman M.D.   On: 04/29/2022 14:33   Impression/Plan: We discussed adjuvant radiotherapy today.  I recommend *** in order to ***.  I reviewed the logistics, benefits, risks, and potential side effects of this treatment in detail. Risks may include but not necessary be limited to acute and late injury tissue in the radiation fields such as skin irritation (change in color/pigmentation, itching, dryness, pain, peeling). She may experience fatigue. We also discussed possible risk of long term cosmetic changes or scar tissue. There is also a smaller risk for lung toxicity, ***cardiac toxicity, ***brachial plexopathy, ***lymphedema, ***musculoskeletal changes, ***rib fragility or ***induction of a second malignancy, ***late chronic non-healing soft tissue wound.    The patient asked good questions which I answered to her satisfaction. She is enthusiastic about proceeding with treatment. A consent form has been *** signed and placed in her chart.  A total of *** medically necessary complex treatment  devices will be fabricated and supervised by me: *** fields with MLCs for custom blocks to protect heart, and lungs;  and, a Vac-lok. MORE  COMPLEX DEVICES MAY BE MADE IN DOSIMETRY FOR FIELD IN FIELD BEAMS FOR DOSE HOMOGENEITY.  I have requested : 3D Simulation which is medically necessary to give adequate dose to at risk tissues while sparing lungs and heart.  I have requested a DVH of the following structures: lungs, heart, *** lumpectomy cavity.    The patient will receive *** Gy in *** fractions to the *** with *** fields.  This will be *** followed by a boost.  On date of service, in total, I spent *** minutes on this encounter. Patient was seen in person.  _____________________________________   Lonie Peak, MD  This document serves as a record of services personally performed by Lonie Peak, MD. It was created on her behalf by Neena Rhymes, a trained medical scribe. The creation of this record is based on the scribe's personal observations and the provider's statements to them. This document has been checked and approved by the attending provider.

## 2022-05-26 ENCOUNTER — Ambulatory Visit
Admission: RE | Admit: 2022-05-26 | Discharge: 2022-05-26 | Disposition: A | Discharge status: Home / Self Care | Payer: BC Managed Care – PPO | Source: Ambulatory Visit | Attending: Radiation Oncology | Admitting: Radiation Oncology

## 2022-05-26 ENCOUNTER — Ambulatory Visit
Admission: RE | Admit: 2022-05-26 | Payer: BC Managed Care – PPO | Source: Ambulatory Visit | Admitting: Radiation Oncology

## 2022-05-26 ENCOUNTER — Encounter: Payer: Self-pay | Admitting: Radiation Oncology

## 2022-05-26 ENCOUNTER — Other Ambulatory Visit: Payer: Self-pay

## 2022-05-26 VITALS — BP 110/76 | HR 61 | Temp 97.6°F | Resp 20

## 2022-05-26 DIAGNOSIS — C50412 Malignant neoplasm of upper-outer quadrant of left female breast: Secondary | ICD-10-CM | POA: Insufficient documentation

## 2022-05-26 DIAGNOSIS — Z170 Estrogen receptor positive status [ER+]: Secondary | ICD-10-CM

## 2022-05-26 LAB — PREGNANCY, URINE: Preg Test, Ur: NEGATIVE

## 2022-06-03 ENCOUNTER — Encounter: Payer: Self-pay | Admitting: *Deleted

## 2022-06-05 ENCOUNTER — Encounter: Payer: Self-pay | Admitting: Adult Health

## 2022-06-05 ENCOUNTER — Inpatient Hospital Stay: Payer: BC Managed Care – PPO

## 2022-06-05 ENCOUNTER — Inpatient Hospital Stay (HOSPITAL_BASED_OUTPATIENT_CLINIC_OR_DEPARTMENT_OTHER): Payer: BC Managed Care – PPO | Admitting: Adult Health

## 2022-06-05 DIAGNOSIS — C50412 Malignant neoplasm of upper-outer quadrant of left female breast: Secondary | ICD-10-CM | POA: Diagnosis not present

## 2022-06-05 DIAGNOSIS — Z170 Estrogen receptor positive status [ER+]: Secondary | ICD-10-CM

## 2022-06-05 DIAGNOSIS — Z95828 Presence of other vascular implants and grafts: Secondary | ICD-10-CM

## 2022-06-05 LAB — CBC WITH DIFFERENTIAL/PLATELET
Abs Immature Granulocytes: 0.01 10*3/uL (ref 0.00–0.07)
Basophils Absolute: 0 10*3/uL (ref 0.0–0.1)
Basophils Relative: 0 %
Eosinophils Absolute: 0.1 10*3/uL (ref 0.0–0.5)
Eosinophils Relative: 1 %
HCT: 36 % (ref 36.0–46.0)
Hemoglobin: 11.7 g/dL — ABNORMAL LOW (ref 12.0–15.0)
Immature Granulocytes: 0 %
Lymphocytes Relative: 28 %
Lymphs Abs: 1.6 10*3/uL (ref 0.7–4.0)
MCH: 27.5 pg (ref 26.0–34.0)
MCHC: 32.5 g/dL (ref 30.0–36.0)
MCV: 84.5 fL (ref 80.0–100.0)
Monocytes Absolute: 0.3 10*3/uL (ref 0.1–1.0)
Monocytes Relative: 5 %
Neutro Abs: 3.8 10*3/uL (ref 1.7–7.7)
Neutrophils Relative %: 66 %
Platelets: 209 10*3/uL (ref 150–400)
RBC: 4.26 MIL/uL (ref 3.87–5.11)
RDW: 12.6 % (ref 11.5–15.5)
WBC: 5.8 10*3/uL (ref 4.0–10.5)
nRBC: 0 % (ref 0.0–0.2)

## 2022-06-05 LAB — COMPREHENSIVE METABOLIC PANEL
ALT: 13 U/L (ref 0–44)
AST: 16 U/L (ref 15–41)
Albumin: 4.3 g/dL (ref 3.5–5.0)
Alkaline Phosphatase: 62 U/L (ref 38–126)
Anion gap: 5 (ref 5–15)
BUN: 15 mg/dL (ref 6–20)
CO2: 29 mmol/L (ref 22–32)
Calcium: 9 mg/dL (ref 8.9–10.3)
Chloride: 106 mmol/L (ref 98–111)
Creatinine, Ser: 0.77 mg/dL (ref 0.44–1.00)
GFR, Estimated: >60 mL/min (ref >60)
Glucose, Bld: 89 mg/dL (ref 70–99)
Potassium: 3.9 mmol/L (ref 3.5–5.1)
Sodium: 140 mmol/L (ref 135–145)
Total Bilirubin: 0.5 mg/dL (ref 0.3–1.2)
Total Protein: 6.5 g/dL (ref 6.5–8.1)

## 2022-06-05 MED ORDER — HEPARIN SOD (PORK) LOCK FLUSH 100 UNIT/ML IV SOLN
500.0000 [IU] | Freq: Once | INTRAVENOUS | Status: AC | PRN
  Administered 2022-06-05: 500 [IU]

## 2022-06-05 MED ORDER — DIPHENHYDRAMINE HCL 25 MG PO CAPS
50.0000 mg | ORAL_CAPSULE | Freq: Once | ORAL | Status: AC
  Administered 2022-06-05: 50 mg via ORAL
  Filled 2022-06-05: qty 2

## 2022-06-05 MED ORDER — SODIUM CHLORIDE 0.9 % IV SOLN
420.0000 mg | Freq: Once | INTRAVENOUS | Status: AC
  Administered 2022-06-05: 420 mg via INTRAVENOUS
  Filled 2022-06-05: qty 14

## 2022-06-05 MED ORDER — SODIUM CHLORIDE 0.9% FLUSH
10.0000 mL | Freq: Once | INTRAVENOUS | Status: AC
  Administered 2022-06-05: 10 mL

## 2022-06-05 MED ORDER — ACETAMINOPHEN 325 MG PO TABS
650.0000 mg | ORAL_TABLET | Freq: Once | ORAL | Status: AC
  Administered 2022-06-05: 650 mg via ORAL
  Filled 2022-06-05: qty 2

## 2022-06-05 MED ORDER — SODIUM CHLORIDE 0.9 % IV SOLN: Freq: Once | INTRAVENOUS | Status: AC

## 2022-06-05 MED ORDER — SODIUM CHLORIDE 0.9% FLUSH
10.0000 mL | INTRAVENOUS | Status: DC | PRN
  Administered 2022-06-05: 10 mL

## 2022-06-05 MED ORDER — TRASTUZUMAB-ANNS CHEMO 150 MG IV SOLR
6.0000 mg/kg | Freq: Once | INTRAVENOUS | Status: AC
  Administered 2022-06-05: 420 mg via INTRAVENOUS
  Filled 2022-06-05: qty 20

## 2022-06-05 NOTE — Progress Notes (Signed)
[facility] Cancer Follow up:    Default, Provider, MD No address on file   DIAGNOSIS:  Cancer Staging  Malignant neoplasm of upper-outer quadrant of left breast in female, estrogen receptor positive (HCC) Staging form: Breast, AJCC 8th Edition - Clinical: Stage IA (cT1c, cN0, cM0, G2, ER+, PR-, HER2+) - Signed by Rachel Moulds, MD on 11/18/2021 Histologic grading system: 3 grade system   SUMMARY OF ONCOLOGIC HISTORY: Oncology History  Malignant neoplasm of upper-outer quadrant of female breast (HCC) (Resolved)  10/20/2018 Initial Diagnosis   Malignant neoplasm of upper-outer quadrant of left breast in female, estrogen receptor positive (HCC)   10/20/2018 Cancer Staging   Staging form: Breast, AJCC 8th Edition - Clinical stage from 10/20/2018: Stage 0 (cTis (DCIS), cN0, cM0, ER+, PR-) - Signed by Lowella Dell, MD on 02/13/2020 Stage prefix: Initial diagnosis   11/03/2018 Genetic Testing   Negative genetic testing:  No pathogenic variants detected on the [facility] Multi-Cancer panel, ordered by Dr. Juliene Pina at [facility]. The report date is 11/03/2018.  The Multi-Cancer Panel offered by [facility] includes sequencing and/or deletion duplication testing of the following 84 genes: AIP, ALK, APC, ATM, AXIN2,BAP1,  BARD1, BLM, BMPR1A, BRCA1, BRCA2, BRIP1, CASR, CDC73, CDH1, CDK4, CDKN1B, CDKN1C, CDKN2A (p14ARF), CDKN2A (p16INK4a), CEBPA, CHEK2, CTNNA1, DICER1, DIS3L2, EGFR (c.2369C>T, p.Thr790Met variant only), EPCAM (Deletion/duplication testing only), FH, FLCN, GATA2, GPC3, GREM1 (Promoter region deletion/duplication testing only), HOXB13 (c.251G>A, p.Gly84Glu), HRAS, KIT, MAX, MEN1, MET, MITF (c.952G>A, p.Glu318Lys variant only), MLH1, MSH2, MSH3, MSH6, MUTYH, NBN, NF1, NF2, NTHL1, PALB2, PDGFRA, PHOX2B, PMS2, POLD1, POLE, POT1, PRKAR1A, PTCH1, PTEN, RAD50, RAD51C, RAD51D, RB1, RECQL4, RET, RUNX1, SDHAF2, SDHA (sequence changes only), SDHB, SDHC, SDHD, SMAD4,  SMARCA4, SMARCB1, SMARCE1, STK11, SUFU, TERC, TERT, TMEM127, TP53, TSC1, TSC2, VHL, WRN and WT1.     01/17/2019 Cancer Staging   Staging form: Breast, AJCC 8th Edition - Pathologic stage from 01/17/2019: Stage IA (pT48mi, pN0, cM0, G2, ER+, PR-, HER2-) - Signed by Loa Socks, NP on 02/01/2019   Malignant neoplasm of upper-outer quadrant of left breast in female, estrogen receptor positive (HCC)  10/21/2021 Breast US   Breast ultrasound of the palpable mass showed hypoechoic oval vascular mass with indistinct margins measuring 1.5 x 1 cm x 1.4 cm.  No abnormal lymph nodes found in the left axilla.  She had ultrasound-guided biopsy.  MRI scheduled for tomorrow.   10/29/2021 Pathology Results   Surgical pathology from the left breast needle core biopsy at 130 o'clock showed invasive ductal carcinoma, overall grade 2, prognostics ER 70% positive weak to moderate staining, PR 0% negative, Ki-67 of 80% and HER2 3+   11/18/2021 Cancer Staging   Staging form: Breast, AJCC 8th Edition - Clinical: Stage IA (cT1c, cN0, cM0, G2, ER+, PR-, HER2+) - Signed by Rachel Moulds, MD on 11/18/2021 Histologic grading system: 3 grade system   11/26/2021 - 03/16/2022 Chemotherapy   Patient is on Treatment Plan : BREAST  Docetaxel + Carboplatin + Trastuzumab + Pertuzumab  (TCHP) q21d      04/03/2022 -  Chemotherapy   Patient is on Treatment Plan : BREAST Trastuzumab + Pertuzumab q21d x 11 cycles     04/30/2022 Surgery   Left breast lumpectomy: no residual cancer identified, margins negative, treatment effect present, 1SLN negative for cancer.   06/24/2022 - 08/03/2022 Radiation Therapy   Adjuvant radiation therapy     CURRENT THERAPY: Herceptin/Perjeta   INTERVAL HISTORY: Stephanie [name] 37 y.o. female returns for follow-up prior  to receiving her next cycle of Herceptin Perjeta.  Her most recent echocardiogram occurred on March 04, 2022.  This demonstrated a left ventricular ejection fraction of 60 to  65%.  Her next echocardiogram is scheduled on Jun 10, 2022.  She notes that her adjuvant radiation has been postponed since her left breast lumpectomy site has not completely healed.  Please note that she is status post bilateral mastectomies.  She has occasional diarrhea that is not as significant as it was previously.   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  MEDICAL HISTORY: Past Medical History:  Diagnosis Date   Atypical mole 03/14/2019   mod-left side superior   BRCA gene mutation negative in female    Breast cancer [facility])    Family history of breast cancer    Family history of lung cancer    Family history of rectal cancer    Melanoma (HCC) 10/14/2007   level II- right arm- (EXC)   PONV (postoperative nausea and vomiting)     SURGICAL HISTORY: Past Surgical History:  Procedure Laterality Date   BREAST BIOPSY Left 04/29/2022   [place] LT RADIOACTIVE SEED LOC 04/29/2022 [facility]AM[facility]GRAP[facility]   BREAST LUMPECTOMY WITH RADIOACTIVE SEED AND SENTINEL LYMPH NODE BIOPSY Left 04/30/2022   Procedure: LEFT BREAST LUMPECTOMY WITH RADIOACTIVE SEED AND SENTINEL LYMPH NODE BIOPSY;  Surgeon: [name], MD;  Location: [facility];  Service: General;  Laterality: Left;   BREAST RECONSTRUCTION WITH PLACEMENT OF TISSUE EXPANDER AND ALLODERM Bilateral 01/17/2019   Procedure: BILATERAL BREAST RECONSTRUCTION WITH PLACEMENT OF TISSUE EXPANDER AND ALLODERM;  Surgeon: Glenna Fellows, MD;  Location: [facility];  Service: Plastics;  Laterality: Bilateral;   IR IMAGING GUIDED PORT INSERTION  11/25/2021   IR RADIOLOGIST EVAL & MGMT  11/26/2021   LESION REMOVAL Left 06/02/2019   Procedure: MINOR EXICISION OF LESION,LAYERED CLOSURE 1 CM;  Surgeon: Glenna Fellows, MD;  Location: [facility];  Service: Plastics;  Laterality: Left;   LIPOSUCTION WITH LIPOFILLING Bilateral 06/02/2019   Procedure: LIPOFILLING FROM ABDOMEN TO BILATERAL CHEST;  Surgeon: Glenna Fellows, MD;  Location: [facility];  Service: Plastics;  Laterality: Bilateral;   melanoma surgery Right 2009   upper right arm   NIPPLE SPARING MASTECTOMY WITH SENTINEL LYMPH NODE BIOPSY Bilateral 01/17/2019   Procedure: BILATERAL NIPPLE SPARING MASTECTOMIES WITH LEFT SENTINEL LYMPH NODE MAPPING;  Surgeon: [name], MD;  Location: [facility];  Service: General;  Laterality: Bilateral;   REMOVAL OF BILATERAL TISSUE EXPANDERS WITH PLACEMENT OF BILATERAL BREAST IMPLANTS Bilateral 06/02/2019   Procedure: REMOVAL OF BILATERAL TISSUE EXPANDERS WITH PLACEMENT OF BILATERAL SILICONE BREAST IMPLANTS;  Surgeon: Glenna Fellows, MD;  Location: [facility];  Service: Plastics;  Laterality: Bilateral;   WISDOM TOOTH EXTRACTION      SOCIAL HISTORY: Social History   Socioeconomic History   Marital status: Married    Spouse name: Not on file   Number of children: Not on  file   Years of education: Not on file   Highest education level: Not on file  Occupational History   Not on file  Tobacco Use   Smoking status: Never   Smokeless tobacco: Never  Vaping Use   Vaping Use: Never used  Substance and Sexual Activity   Alcohol use: Yes    Comment: occasional   Drug use: Never   Sexual activity: Yes    Birth control/protection: None  Other Topics Concern   Not on file  Social History Narrative   Not on file   Social Determinants of Health   Financial Resource Strain: Not on file   Food Insecurity: No Food Insecurity (05/25/2022)   Hunger Vital Sign    Worried About Running Out of Food in the Last Year: Never true    Ran Out of Food in the Last Year: Never true  Transportation Needs: No Transportation Needs (05/25/2022)   PRAPARE - Administrator, Civil Service (Medical): No    Lack of Transportation (Non-Medical): No  Physical Activity: Not on file  Stress: Not on file  Social Connections: Not on file  Intimate Partner Violence: Not At Risk (05/25/2022)   Humiliation, Afraid, Rape, and Kick questionnaire    Fear of Current or Ex-Partner: No    Emotionally Abused: No    Physically Abused: No    Sexually Abused: No    FAMILY HISTORY: Family History  Problem Relation Age of Onset   Hypertension Mother    Heart disease Father    Breast cancer Maternal Aunt 47 - 69   Breast cancer Maternal Aunt 35 - 59   Lung cancer Paternal Aunt        diagnosed late 8s   Breast cancer Maternal Grandmother 100 - 69   Breast cancer Paternal Grandmother 75 - 27   Lung cancer Paternal Grandmother        thought to be breast cancer mets   Rectal cancer Paternal Grandmother        diagnosed 6s   Lung cancer Paternal Grandfather        diagnosed 38s    Review of Systems  Constitutional:  Negative for appetite change, chills, fatigue, fever and unexpected weight change.  HENT:   Negative for hearing loss, lump/mass and trouble swallowing.   Eyes:  Negative for eye problems and icterus.  Respiratory:  Negative for chest tightness, cough and shortness of breath.   Cardiovascular:  Negative for chest pain, leg swelling and palpitations.  Gastrointestinal:  Negative for abdominal distention, abdominal pain, constipation, diarrhea, nausea and vomiting.  Endocrine: Negative for hot flashes.  Genitourinary:  Negative for difficulty urinating.   Musculoskeletal:  Negative for arthralgias.  Skin:  Negative for itching and rash.  Neurological:  Negative for dizziness,  extremity weakness, headaches and numbness.  Hematological:  Negative for adenopathy. Does not bruise/bleed easily.  Psychiatric/Behavioral:  Negative for depression. The patient is not nervous/anxious.       PHYSICAL EXAMINATION    Vitals:   06/05/22 1215  BP: 97/74  Pulse: 64  Resp: 17  Temp: (!) 97.5 F (36.4 C)  SpO2: 100%    Physical Exam Constitutional:      General: She is not in acute distress.    Appearance: Normal appearance. She is not toxic-appearing.  HENT:     Head: Normocephalic and atraumatic.     Mouth/Throat:     Mouth: Mucous membranes are moist.     Pharynx: Oropharynx is clear.  No oropharyngeal exudate or posterior oropharyngeal erythema.  Eyes:     General: No scleral icterus. Cardiovascular:     Rate and Rhythm: Normal rate and regular rhythm.     Pulses: Normal pulses.     Heart sounds: Normal heart sounds.  Pulmonary:     Effort: Pulmonary effort is normal.     Breath sounds: Normal breath sounds.  Abdominal:     General: Abdomen is flat. Bowel sounds are normal. There is no distension.     Palpations: Abdomen is soft.     Tenderness: There is no abdominal tenderness.  Musculoskeletal:        General: No swelling.     Cervical back: Neck supple.  Lymphadenopathy:     Cervical: No cervical adenopathy.  Skin:    General: Skin is warm and dry.     Findings: No rash.  Neurological:     General: No focal deficit present.     Mental Status: She is alert.  Psychiatric:        Mood and Affect: Mood normal.        Behavior: Behavior normal.     LABORATORY DATA:  CBC    Component Value Date/Time   WBC 5.8 06/05/2022 1143   RBC 4.26 06/05/2022 1143   HGB 11.7 (L) 06/05/2022 1143   HGB 10.1 (L) 03/13/2022 1210   HCT 36.0 06/05/2022 1143   PLT 209 06/05/2022 1143   PLT 196 03/13/2022 1210   MCV 84.5 06/05/2022 1143   MCH 27.5 06/05/2022 1143   MCHC 32.5 06/05/2022 1143   RDW 12.6 06/05/2022 1143   LYMPHSABS 1.6 06/05/2022 1143    MONOABS 0.3 06/05/2022 1143   EOSABS 0.1 06/05/2022 1143   BASOSABS 0.0 06/05/2022 1143    CMP     Component Value Date/Time   NA 140 06/05/2022 1143   K 3.9 06/05/2022 1143   CL 106 06/05/2022 1143   CO2 29 06/05/2022 1143   GLUCOSE 89 06/05/2022 1143   BUN 15 06/05/2022 1143   CREATININE 0.77 06/05/2022 1143   CREATININE 0.79 03/13/2022 1210   CALCIUM 9.0 06/05/2022 1143   PROT 6.5 06/05/2022 1143   ALBUMIN 4.3 06/05/2022 1143   AST 16 06/05/2022 1143   AST 18 03/13/2022 1210   ALT 13 06/05/2022 1143   ALT 12 03/13/2022 1210   ALKPHOS 62 06/05/2022 1143   BILITOT 0.5 06/05/2022 1143   BILITOT 0.4 03/13/2022 1210   GFRNONAA >60 06/05/2022 1143   GFRNONAA >60 03/13/2022 1210   GFRAA >60 06/04/2019 0201      ASSESSMENT and THERAPY PLAN:   Malignant neoplasm of upper-outer quadrant of left breast in female, estrogen receptor positive (HCC) Stephanie [name] is a 27 year old woman with recurrent left-sided stage Ia ER HER2 positive breast cancer diagnosed in October 2023.  She is status post neoadjuvant chemotherapy, lumpectomy, and continues on maintenance Herceptin/Perjeta.    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  Her next echo is scheduled on May 29. She is due for adjuvant radiation therapy which has been postponed due to delayed wound healing of her left lumpectomy site.  This is now tentatively scheduled June 14.  She will see Dr. Luisa Hart on June 3 and at that point she may require another delay in adjuvant radiation. We discussed her next steps with her treatment including antiestrogen therapy once her radiation has completed.  Stephanie [name] will return in 3 weeks for labs,  follow-up, and her next infusion.  She knows to let us know if she has any questions or concerns that may arise between now and her next visit with [place].   All questions were answered. The patient knows to call the clinic with any problems,  questions or concerns. We can certainly see the patient much sooner if necessary.  Total encounter time:30 minutes*in face-to-face visit time, chart review, lab review, care coordination, order entry, and documentation of the encounter time.    Lillard Anes, NP 06/05/22 1:11 PM Medical Oncology and Hematology [facility] [address] [place], [place] [address] Tel. [phone]    Fax. [phone]  *Total Encounter Time as defined by the Centers for Medicare and Medicaid Services includes, in addition to the face-to-face time of a patient visit (documented in the note above) non-face-to-face time: obtaining and reviewing outside history, ordering and reviewing medications, tests or procedures, care coordination (communications with other health care professionals or caregivers) and documentation in the medical record.

## 2022-06-05 NOTE — Patient Instructions (Signed)
[facility]  Discharge Instructions: Thank you for choosing [facility] to provide your oncology and hematology care.   If you have a lab appointment with the Cancer Center, please go directly to the Cancer Center and check in at the registration area.   Wear comfortable clothing and clothing appropriate for easy access to any Portacath or PICC line.   We strive to give you quality time with your provider. You may need to reschedule your appointment if you arrive late (15 or more minutes).  Arriving late affects you and other patients whose appointments are after yours.  Also, if you miss three or more appointments without notifying the office, you may be dismissed from the clinic at the provider's discretion.      For prescription refill requests, have your pharmacy contact our office and allow 72 hours for refills to be completed.    Today you received the following chemotherapy and/or immunotherapy agents Kanjinti, Perjeta.      To help prevent nausea and vomiting after your treatment, we encourage you to take your nausea medication as directed.  BELOW ARE SYMPTOMS THAT SHOULD BE REPORTED IMMEDIATELY: *FEVER GREATER THAN 100.4 F (38 C) OR HIGHER *CHILLS OR SWEATING *NAUSEA AND VOMITING THAT IS NOT CONTROLLED WITH YOUR NAUSEA MEDICATION *UNUSUAL SHORTNESS OF BREATH *UNUSUAL BRUISING OR BLEEDING *URINARY PROBLEMS (pain or burning when urinating, or frequent urination) *BOWEL PROBLEMS (unusual diarrhea, constipation, pain near the anus) TENDERNESS IN MOUTH AND THROAT WITH OR WITHOUT PRESENCE OF ULCERS (sore throat, sores in mouth, or a toothache) UNUSUAL RASH, SWELLING OR PAIN  UNUSUAL VAGINAL DISCHARGE OR ITCHING   Items with * indicate a potential emergency and should be followed up as soon as possible or go to the Emergency Department if any problems should occur.  Please show the CHEMOTHERAPY ALERT CARD or IMMUNOTHERAPY ALERT CARD  at check-in to the Emergency Department and triage nurse.  Should you have questions after your visit or need to cancel or reschedule your appointment, please contact [facility]  Dept: [phone]  and follow the prompts.  Office hours are 8:00 a.m. to 4:30 p.m. Monday - Friday. Please note that voicemails left after 4:00 p.m. may not be returned until the following business day.  We are closed weekends and major holidays. You have access to a nurse at all times for urgent questions. Please call the main number to the clinic Dept: [phone] and follow the prompts.   For any non-urgent questions, you may also contact your provider using MyChart. We now offer e-Visits for anyone 18 and older to request care online for non-urgent symptoms. For details visit [email or web address].   Also download the MyChart app! Go to the app store, search "MyChart", open the app, select [facility], and log in with your MyChart username and password.   

## 2022-06-05 NOTE — Assessment & Plan Note (Signed)
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  She is status post neoadjuvant chemotherapy, lumpectomy, and continues on maintenance Herceptin/Perjeta.    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  Her next echo is scheduled on May 29. She is due for adjuvant radiation therapy which has been postponed due to delayed wound healing of her left lumpectomy site.  This is now tentatively scheduled June 14.  She will see Dr. Luisa Hart on June 3 and at that point she may require another delay in adjuvant radiation. We discussed her next steps with her treatment including antiestrogen therapy once her radiation has completed.  Stephanie [name] will return in 3 weeks for labs, follow-up, and her next infusion.  She knows to let us know if she has any questions or concerns that may arise between now and her next visit with [place].

## 2022-06-10 ENCOUNTER — Ambulatory Visit (HOSPITAL_COMMUNITY)
Admission: RE | Admit: 2022-06-10 | Discharge: 2022-06-10 | Disposition: A | Discharge status: Home / Self Care | Payer: BC Managed Care – PPO | Source: Ambulatory Visit | Attending: Adult Health | Admitting: Adult Health

## 2022-06-10 DIAGNOSIS — Z9882 Breast implant status: Secondary | ICD-10-CM | POA: Insufficient documentation

## 2022-06-10 DIAGNOSIS — C50412 Malignant neoplasm of upper-outer quadrant of left female breast: Secondary | ICD-10-CM | POA: Diagnosis not present

## 2022-06-10 DIAGNOSIS — Z170 Estrogen receptor positive status [ER+]: Secondary | ICD-10-CM | POA: Diagnosis not present

## 2022-06-10 DIAGNOSIS — Z0189 Encounter for other specified special examinations: Secondary | ICD-10-CM | POA: Diagnosis not present

## 2022-06-10 LAB — ECHOCARDIOGRAM COMPLETE
AR max vel: 2.39 cm2
AV Area VTI: 2.43 cm2
AV Area mean vel: 2.28 cm2
AV Mean grad: 4 mmHg
AV Peak grad: 7.3 mmHg
Ao pk vel: 1.35 m/s
Area-P 1/2: 4.93 cm2
Est EF: 65
S' Lateral: 3 cm
Single Plane A4C EF: 64.8 %

## 2022-06-10 NOTE — Progress Notes (Signed)
  Echocardiogram 2D Echocardiogram has been performed.  Stephanie [name] Wynn Banker 06/10/2022, 8:40 AM

## 2022-06-17 ENCOUNTER — Telehealth: Payer: Self-pay | Admitting: Hematology and Oncology

## 2022-06-17 NOTE — Telephone Encounter (Signed)
Left patient a vm regarding appointment change  

## 2022-06-19 DIAGNOSIS — Z170 Estrogen receptor positive status [ER+]: Secondary | ICD-10-CM | POA: Insufficient documentation

## 2022-06-19 DIAGNOSIS — C50412 Malignant neoplasm of upper-outer quadrant of left female breast: Secondary | ICD-10-CM | POA: Insufficient documentation

## 2022-06-23 ENCOUNTER — Telehealth: Payer: Self-pay

## 2022-06-23 NOTE — Telephone Encounter (Signed)
Rn called pt to inform her of her new start date for radiation treatments. Pt has previously let us know that Dr. Luisa Hart was not ready for release her for radiation treatment due to wound healing concerns. She did state that the wound is now closed but still needs more time. She will see Dr. Luisa Hart on on 6-18. We have a new start date on 6-19. Pt was grateful for the phone call and is ready to get started.

## 2022-06-24 ENCOUNTER — Ambulatory Visit: Payer: BC Managed Care – PPO | Admitting: Radiation Oncology

## 2022-06-24 ENCOUNTER — Other Ambulatory Visit: Payer: Self-pay

## 2022-06-25 ENCOUNTER — Ambulatory Visit: Payer: BC Managed Care – PPO

## 2022-06-26 ENCOUNTER — Other Ambulatory Visit: Payer: Self-pay

## 2022-06-26 ENCOUNTER — Ambulatory Visit: Payer: BC Managed Care – PPO | Admitting: Hematology and Oncology

## 2022-06-26 ENCOUNTER — Ambulatory Visit: Payer: BC Managed Care – PPO

## 2022-06-26 ENCOUNTER — Inpatient Hospital Stay: Payer: BC Managed Care – PPO | Attending: Hematology and Oncology

## 2022-06-26 ENCOUNTER — Inpatient Hospital Stay: Payer: BC Managed Care – PPO

## 2022-06-26 ENCOUNTER — Other Ambulatory Visit: Payer: BC Managed Care – PPO

## 2022-06-26 VITALS — BP 101/59 | HR 69 | Temp 98.1°F | Resp 16

## 2022-06-26 DIAGNOSIS — Z170 Estrogen receptor positive status [ER+]: Secondary | ICD-10-CM | POA: Insufficient documentation

## 2022-06-26 DIAGNOSIS — Z95828 Presence of other vascular implants and grafts: Secondary | ICD-10-CM

## 2022-06-26 DIAGNOSIS — C50412 Malignant neoplasm of upper-outer quadrant of left female breast: Secondary | ICD-10-CM | POA: Diagnosis present

## 2022-06-26 DIAGNOSIS — Z79899 Other long term (current) drug therapy: Secondary | ICD-10-CM | POA: Diagnosis not present

## 2022-06-26 DIAGNOSIS — Z9013 Acquired absence of bilateral breasts and nipples: Secondary | ICD-10-CM | POA: Diagnosis not present

## 2022-06-26 DIAGNOSIS — Z5111 Encounter for antineoplastic chemotherapy: Secondary | ICD-10-CM | POA: Diagnosis present

## 2022-06-26 LAB — CBC WITH DIFFERENTIAL/PLATELET
Abs Immature Granulocytes: 0 10*3/uL (ref 0.00–0.07)
Basophils Absolute: 0 10*3/uL (ref 0.0–0.1)
Basophils Relative: 1 %
Eosinophils Absolute: 0.1 10*3/uL (ref 0.0–0.5)
Eosinophils Relative: 2 %
HCT: 37.7 % (ref 36.0–46.0)
Hemoglobin: 12.1 g/dL (ref 12.0–15.0)
Immature Granulocytes: 0 %
Lymphocytes Relative: 32 %
Lymphs Abs: 1.9 10*3/uL (ref 0.7–4.0)
MCH: 27.1 pg (ref 26.0–34.0)
MCHC: 32.1 g/dL (ref 30.0–36.0)
MCV: 84.3 fL (ref 80.0–100.0)
Monocytes Absolute: 0.3 10*3/uL (ref 0.1–1.0)
Monocytes Relative: 5 %
Neutro Abs: 3.6 10*3/uL (ref 1.7–7.7)
Neutrophils Relative %: 60 %
Platelets: 230 10*3/uL (ref 150–400)
RBC: 4.47 MIL/uL (ref 3.87–5.11)
RDW: 13.2 % (ref 11.5–15.5)
WBC: 5.9 10*3/uL (ref 4.0–10.5)
nRBC: 0 % (ref 0.0–0.2)

## 2022-06-26 LAB — COMPREHENSIVE METABOLIC PANEL
ALT: 12 U/L (ref 0–44)
AST: 17 U/L (ref 15–41)
Albumin: 4 g/dL (ref 3.5–5.0)
Alkaline Phosphatase: 71 U/L (ref 38–126)
Anion gap: 6 (ref 5–15)
BUN: 12 mg/dL (ref 6–20)
CO2: 28 mmol/L (ref 22–32)
Calcium: 9 mg/dL (ref 8.9–10.3)
Chloride: 106 mmol/L (ref 98–111)
Creatinine, Ser: 0.76 mg/dL (ref 0.44–1.00)
GFR, Estimated: >60 mL/min (ref >60)
Glucose, Bld: 120 mg/dL — ABNORMAL HIGH (ref 70–99)
Potassium: 3.8 mmol/L (ref 3.5–5.1)
Sodium: 140 mmol/L (ref 135–145)
Total Bilirubin: 0.5 mg/dL (ref 0.3–1.2)
Total Protein: 6.4 g/dL — ABNORMAL LOW (ref 6.5–8.1)

## 2022-06-26 MED ORDER — SODIUM CHLORIDE 0.9 % IV SOLN: Freq: Once | INTRAVENOUS | Status: AC

## 2022-06-26 MED ORDER — ACETAMINOPHEN 325 MG PO TABS
650.0000 mg | ORAL_TABLET | Freq: Once | ORAL | Status: AC
  Administered 2022-06-26: 650 mg via ORAL
  Filled 2022-06-26: qty 2

## 2022-06-26 MED ORDER — TRASTUZUMAB-ANNS CHEMO 420 MG IV SOLR
6.0000 mg/kg | Freq: Once | INTRAVENOUS | Status: AC
  Administered 2022-06-26: 420 mg via INTRAVENOUS
  Filled 2022-06-26: qty 20

## 2022-06-26 MED ORDER — SODIUM CHLORIDE 0.9% FLUSH
10.0000 mL | INTRAVENOUS | Status: DC | PRN
  Administered 2022-06-26: 10 mL

## 2022-06-26 MED ORDER — SODIUM CHLORIDE 0.9% FLUSH
10.0000 mL | Freq: Once | INTRAVENOUS | Status: AC
  Administered 2022-06-26: 10 mL

## 2022-06-26 MED ORDER — SODIUM CHLORIDE 0.9 % IV SOLN
420.0000 mg | Freq: Once | INTRAVENOUS | Status: AC
  Administered 2022-06-26: 420 mg via INTRAVENOUS
  Filled 2022-06-26: qty 14

## 2022-06-26 MED ORDER — HEPARIN SOD (PORK) LOCK FLUSH 100 UNIT/ML IV SOLN
500.0000 [IU] | Freq: Once | INTRAVENOUS | Status: AC | PRN
  Administered 2022-06-26: 500 [IU]

## 2022-06-26 MED ORDER — DIPHENHYDRAMINE HCL 25 MG PO CAPS
50.0000 mg | ORAL_CAPSULE | Freq: Once | ORAL | Status: AC
  Administered 2022-06-26: 50 mg via ORAL
  Filled 2022-06-26: qty 2

## 2022-06-26 NOTE — Patient Instructions (Signed)
[facility]  Discharge Instructions: Thank you for choosing [facility] to provide your oncology and hematology care.   If you have a lab appointment with the Cancer Center, please go directly to the Cancer Center and check in at the registration area.   Wear comfortable clothing and clothing appropriate for easy access to any Portacath or PICC line.   We strive to give you quality time with your provider. You may need to reschedule your appointment if you arrive late (15 or more minutes).  Arriving late affects you and other patients whose appointments are after yours.  Also, if you miss three or more appointments without notifying the office, you may be dismissed from the clinic at the provider's discretion.      For prescription refill requests, have your pharmacy contact our office and allow 72 hours for refills to be completed.    Today you received the following chemotherapy and/or immunotherapy agents: trastuzumab and pertuzumab      To help prevent nausea and vomiting after your treatment, we encourage you to take your nausea medication as directed.  BELOW ARE SYMPTOMS THAT SHOULD BE REPORTED IMMEDIATELY: *FEVER GREATER THAN 100.4 F (38 C) OR HIGHER *CHILLS OR SWEATING *NAUSEA AND VOMITING THAT IS NOT CONTROLLED WITH YOUR NAUSEA MEDICATION *UNUSUAL SHORTNESS OF BREATH *UNUSUAL BRUISING OR BLEEDING *URINARY PROBLEMS (pain or burning when urinating, or frequent urination) *BOWEL PROBLEMS (unusual diarrhea, constipation, pain near the anus) TENDERNESS IN MOUTH AND THROAT WITH OR WITHOUT PRESENCE OF ULCERS (sore throat, sores in mouth, or a toothache) UNUSUAL RASH, SWELLING OR PAIN  UNUSUAL VAGINAL DISCHARGE OR ITCHING   Items with * indicate a potential emergency and should be followed up as soon as possible or go to the Emergency Department if any problems should occur.  Please show the CHEMOTHERAPY ALERT CARD or IMMUNOTHERAPY  ALERT CARD at check-in to the Emergency Department and triage nurse.  Should you have questions after your visit or need to cancel or reschedule your appointment, please contact [facility]  Dept: [phone]  and follow the prompts.  Office hours are 8:00 a.m. to 4:30 p.m. Monday - Friday. Please note that voicemails left after 4:00 p.m. may not be returned until the following business day.  We are closed weekends and major holidays. You have access to a nurse at all times for urgent questions. Please call the main number to the clinic Dept: [phone] and follow the prompts.   For any non-urgent questions, you may also contact your provider using MyChart. We now offer e-Visits for anyone 18 and older to request care online for non-urgent symptoms. For details visit [email or web address].   Also download the MyChart app! Go to the app store, search "MyChart", open the app, select [facility], and log in with your MyChart username and password.   

## 2022-06-29 ENCOUNTER — Ambulatory Visit: Payer: BC Managed Care – PPO

## 2022-06-30 ENCOUNTER — Telehealth: Payer: Self-pay

## 2022-06-30 ENCOUNTER — Ambulatory Visit: Payer: BC Managed Care – PPO

## 2022-06-30 NOTE — Telephone Encounter (Signed)
Rn called pt to remind her of her appointment time on 07-01-22. She was able to verbalize the time back. She looks forward to starting treatment.

## 2022-07-01 ENCOUNTER — Other Ambulatory Visit: Payer: Self-pay

## 2022-07-01 ENCOUNTER — Ambulatory Visit
Admission: RE | Admit: 2022-07-01 | Discharge: 2022-07-01 | Disposition: A | Discharge status: Home / Self Care | Payer: BC Managed Care – PPO | Source: Ambulatory Visit | Attending: Radiation Oncology | Admitting: Radiation Oncology

## 2022-07-01 DIAGNOSIS — C50412 Malignant neoplasm of upper-outer quadrant of left female breast: Secondary | ICD-10-CM | POA: Diagnosis not present

## 2022-07-01 LAB — RAD ONC ARIA SESSION SUMMARY
Course Elapsed Days: 0
Plan Fractions Treated to Date: 1
Plan Prescribed Dose Per Fraction: 1.8 Gy
Plan Total Fractions Prescribed: 14
Plan Total Prescribed Dose: 25.2 Gy
Reference Point Dosage Given to Date: 1.8 Gy
Reference Point Session Dosage Given: 1.8 Gy
Session Number: 1

## 2022-07-02 ENCOUNTER — Other Ambulatory Visit: Payer: Self-pay

## 2022-07-02 ENCOUNTER — Ambulatory Visit
Admission: RE | Admit: 2022-07-02 | Discharge: 2022-07-02 | Disposition: A | Discharge status: Home / Self Care | Payer: BC Managed Care – PPO | Source: Ambulatory Visit | Attending: Radiation Oncology | Admitting: Radiation Oncology

## 2022-07-02 DIAGNOSIS — C50412 Malignant neoplasm of upper-outer quadrant of left female breast: Secondary | ICD-10-CM | POA: Diagnosis not present

## 2022-07-02 LAB — RAD ONC ARIA SESSION SUMMARY
Course Elapsed Days: 1
Plan Fractions Treated to Date: 1
Plan Prescribed Dose Per Fraction: 1.8 Gy
Plan Total Fractions Prescribed: 14
Plan Total Prescribed Dose: 25.2 Gy
Reference Point Dosage Given to Date: 1.8 Gy
Reference Point Session Dosage Given: 1.8 Gy
Session Number: 2

## 2022-07-03 ENCOUNTER — Encounter: Payer: Self-pay | Admitting: Hematology and Oncology

## 2022-07-03 ENCOUNTER — Other Ambulatory Visit: Payer: Self-pay

## 2022-07-03 ENCOUNTER — Ambulatory Visit
Admission: RE | Admit: 2022-07-03 | Discharge: 2022-07-03 | Disposition: A | Discharge status: Home / Self Care | Payer: BC Managed Care – PPO | Source: Ambulatory Visit | Attending: Radiation Oncology | Admitting: Radiation Oncology

## 2022-07-03 DIAGNOSIS — C50412 Malignant neoplasm of upper-outer quadrant of left female breast: Secondary | ICD-10-CM | POA: Diagnosis not present

## 2022-07-03 LAB — RAD ONC ARIA SESSION SUMMARY
Course Elapsed Days: 2
Plan Fractions Treated to Date: 2
Plan Prescribed Dose Per Fraction: 1.8 Gy
Plan Total Fractions Prescribed: 14
Plan Total Prescribed Dose: 25.2 Gy
Reference Point Dosage Given to Date: 3.6 Gy
Reference Point Session Dosage Given: 1.8 Gy
Session Number: 3

## 2022-07-06 ENCOUNTER — Other Ambulatory Visit: Payer: Self-pay

## 2022-07-06 ENCOUNTER — Ambulatory Visit
Admission: RE | Admit: 2022-07-06 | Discharge: 2022-07-06 | Disposition: A | Discharge status: Home / Self Care | Payer: BC Managed Care – PPO | Source: Ambulatory Visit | Attending: Radiation Oncology | Admitting: Radiation Oncology

## 2022-07-06 ENCOUNTER — Telehealth: Payer: Self-pay | Admitting: Hematology and Oncology

## 2022-07-06 DIAGNOSIS — C50412 Malignant neoplasm of upper-outer quadrant of left female breast: Secondary | ICD-10-CM

## 2022-07-06 LAB — RAD ONC ARIA SESSION SUMMARY
Course Elapsed Days: 5
Plan Fractions Treated to Date: 2
Plan Prescribed Dose Per Fraction: 1.8 Gy
Plan Total Fractions Prescribed: 14
Plan Total Prescribed Dose: 25.2 Gy
Reference Point Dosage Given to Date: 3.6 Gy
Reference Point Session Dosage Given: 1.8 Gy
Session Number: 4

## 2022-07-06 MED ORDER — RADIAPLEXRX EX GEL: Freq: Once | CUTANEOUS | Status: AC

## 2022-07-06 NOTE — Telephone Encounter (Signed)
Rescheduled appointment per provider PAL. Patient is aware of the changes made to her upcoming appointment. 

## 2022-07-06 NOTE — Telephone Encounter (Signed)
Scheduled appointments per WQ. Patient is aware of the made appointments.  

## 2022-07-07 ENCOUNTER — Other Ambulatory Visit: Payer: Self-pay

## 2022-07-07 ENCOUNTER — Ambulatory Visit
Admission: RE | Admit: 2022-07-07 | Discharge: 2022-07-07 | Disposition: A | Discharge status: Home / Self Care | Payer: BC Managed Care – PPO | Source: Ambulatory Visit | Attending: Radiation Oncology | Admitting: Radiation Oncology

## 2022-07-07 DIAGNOSIS — C50412 Malignant neoplasm of upper-outer quadrant of left female breast: Secondary | ICD-10-CM | POA: Diagnosis not present

## 2022-07-07 LAB — RAD ONC ARIA SESSION SUMMARY
Course Elapsed Days: 6
Plan Fractions Treated to Date: 3
Plan Prescribed Dose Per Fraction: 1.8 Gy
Plan Total Fractions Prescribed: 14
Plan Total Prescribed Dose: 25.2 Gy
Reference Point Dosage Given to Date: 5.4 Gy
Reference Point Session Dosage Given: 1.8 Gy
Session Number: 5

## 2022-07-08 ENCOUNTER — Other Ambulatory Visit: Payer: Self-pay

## 2022-07-08 ENCOUNTER — Ambulatory Visit
Admission: RE | Admit: 2022-07-08 | Discharge: 2022-07-08 | Disposition: A | Discharge status: Home / Self Care | Payer: BC Managed Care – PPO | Source: Ambulatory Visit | Attending: Radiation Oncology | Admitting: Radiation Oncology

## 2022-07-08 DIAGNOSIS — C50412 Malignant neoplasm of upper-outer quadrant of left female breast: Secondary | ICD-10-CM | POA: Diagnosis not present

## 2022-07-08 LAB — RAD ONC ARIA SESSION SUMMARY
Course Elapsed Days: 7
Plan Fractions Treated to Date: 3
Plan Prescribed Dose Per Fraction: 1.8 Gy
Plan Total Fractions Prescribed: 14
Plan Total Prescribed Dose: 25.2 Gy
Reference Point Dosage Given to Date: 5.4 Gy
Reference Point Session Dosage Given: 1.8 Gy
Session Number: 6

## 2022-07-09 ENCOUNTER — Ambulatory Visit
Admission: RE | Admit: 2022-07-09 | Discharge: 2022-07-09 | Disposition: A | Discharge status: Home / Self Care | Payer: BC Managed Care – PPO | Source: Ambulatory Visit | Attending: Radiation Oncology | Admitting: Radiation Oncology

## 2022-07-09 ENCOUNTER — Other Ambulatory Visit: Payer: Self-pay

## 2022-07-09 DIAGNOSIS — C50412 Malignant neoplasm of upper-outer quadrant of left female breast: Secondary | ICD-10-CM | POA: Diagnosis not present

## 2022-07-09 LAB — RAD ONC ARIA SESSION SUMMARY
Course Elapsed Days: 8
Plan Fractions Treated to Date: 4
Plan Prescribed Dose Per Fraction: 1.8 Gy
Plan Total Fractions Prescribed: 14
Plan Total Prescribed Dose: 25.2 Gy
Reference Point Dosage Given to Date: 7.2 Gy
Reference Point Session Dosage Given: 1.8 Gy
Session Number: 7

## 2022-07-10 ENCOUNTER — Other Ambulatory Visit: Payer: Self-pay

## 2022-07-10 ENCOUNTER — Ambulatory Visit
Admission: RE | Admit: 2022-07-10 | Discharge: 2022-07-10 | Disposition: A | Discharge status: Home / Self Care | Payer: BC Managed Care – PPO | Source: Ambulatory Visit | Attending: Radiation Oncology | Admitting: Radiation Oncology

## 2022-07-10 DIAGNOSIS — C50412 Malignant neoplasm of upper-outer quadrant of left female breast: Secondary | ICD-10-CM | POA: Diagnosis not present

## 2022-07-10 LAB — RAD ONC ARIA SESSION SUMMARY
Course Elapsed Days: 9
Plan Fractions Treated to Date: 4
Plan Prescribed Dose Per Fraction: 1.8 Gy
Plan Total Fractions Prescribed: 14
Plan Total Prescribed Dose: 25.2 Gy
Reference Point Dosage Given to Date: 7.2 Gy
Reference Point Session Dosage Given: 1.8 Gy
Session Number: 8

## 2022-07-13 ENCOUNTER — Ambulatory Visit
Admission: RE | Admit: 2022-07-13 | Discharge: 2022-07-13 | Disposition: A | Discharge status: Home / Self Care | Payer: BC Managed Care – PPO | Source: Ambulatory Visit | Attending: Radiation Oncology | Admitting: Radiation Oncology

## 2022-07-13 ENCOUNTER — Other Ambulatory Visit: Payer: Self-pay

## 2022-07-13 DIAGNOSIS — D0512 Intraductal carcinoma in situ of left breast: Secondary | ICD-10-CM | POA: Diagnosis not present

## 2022-07-13 LAB — RAD ONC ARIA SESSION SUMMARY
Course Elapsed Days: 12
Plan Fractions Treated to Date: 5
Plan Prescribed Dose Per Fraction: 1.8 Gy
Plan Total Fractions Prescribed: 14
Plan Total Prescribed Dose: 25.2 Gy
Reference Point Dosage Given to Date: 9 Gy
Reference Point Session Dosage Given: 1.8 Gy
Session Number: 9

## 2022-07-14 ENCOUNTER — Other Ambulatory Visit: Payer: Self-pay

## 2022-07-14 ENCOUNTER — Ambulatory Visit
Admission: RE | Admit: 2022-07-14 | Discharge: 2022-07-14 | Disposition: A | Discharge status: Home / Self Care | Payer: BC Managed Care – PPO | Source: Ambulatory Visit | Attending: Radiation Oncology | Admitting: Radiation Oncology

## 2022-07-14 DIAGNOSIS — D0512 Intraductal carcinoma in situ of left breast: Secondary | ICD-10-CM | POA: Diagnosis not present

## 2022-07-14 LAB — RAD ONC ARIA SESSION SUMMARY
Course Elapsed Days: 13
Plan Fractions Treated to Date: 5
Plan Prescribed Dose Per Fraction: 1.8 Gy
Plan Total Fractions Prescribed: 14
Plan Total Prescribed Dose: 25.2 Gy
Reference Point Dosage Given to Date: 9 Gy
Reference Point Session Dosage Given: 1.8 Gy
Session Number: 10

## 2022-07-15 ENCOUNTER — Ambulatory Visit
Admission: RE | Admit: 2022-07-15 | Discharge: 2022-07-15 | Disposition: A | Discharge status: Home / Self Care | Payer: BC Managed Care – PPO | Source: Ambulatory Visit | Attending: Radiation Oncology | Admitting: Radiation Oncology

## 2022-07-15 ENCOUNTER — Other Ambulatory Visit: Payer: Self-pay

## 2022-07-15 ENCOUNTER — Inpatient Hospital Stay: Payer: BC Managed Care – PPO

## 2022-07-15 ENCOUNTER — Encounter: Payer: Self-pay | Admitting: Hematology and Oncology

## 2022-07-15 ENCOUNTER — Inpatient Hospital Stay: Payer: BC Managed Care – PPO | Admitting: Hematology and Oncology

## 2022-07-15 VITALS — BP 99/70 | HR 55 | Temp 97.3°F | Resp 16 | Wt 153.7 lb

## 2022-07-15 DIAGNOSIS — C50412 Malignant neoplasm of upper-outer quadrant of left female breast: Secondary | ICD-10-CM | POA: Insufficient documentation

## 2022-07-15 DIAGNOSIS — Z95828 Presence of other vascular implants and grafts: Secondary | ICD-10-CM

## 2022-07-15 DIAGNOSIS — Z170 Estrogen receptor positive status [ER+]: Secondary | ICD-10-CM | POA: Insufficient documentation

## 2022-07-15 DIAGNOSIS — Z923 Personal history of irradiation: Secondary | ICD-10-CM | POA: Insufficient documentation

## 2022-07-15 DIAGNOSIS — Z5111 Encounter for antineoplastic chemotherapy: Secondary | ICD-10-CM | POA: Insufficient documentation

## 2022-07-15 DIAGNOSIS — Z79899 Other long term (current) drug therapy: Secondary | ICD-10-CM | POA: Insufficient documentation

## 2022-07-15 DIAGNOSIS — D0512 Intraductal carcinoma in situ of left breast: Secondary | ICD-10-CM | POA: Diagnosis not present

## 2022-07-15 LAB — CBC WITH DIFFERENTIAL/PLATELET
Abs Immature Granulocytes: 0.02 10*3/uL (ref 0.00–0.07)
Basophils Absolute: 0 10*3/uL (ref 0.0–0.1)
Basophils Relative: 1 %
Eosinophils Absolute: 0.1 10*3/uL (ref 0.0–0.5)
Eosinophils Relative: 1 %
HCT: 35.7 % — ABNORMAL LOW (ref 36.0–46.0)
Hemoglobin: 11.5 g/dL — ABNORMAL LOW (ref 12.0–15.0)
Immature Granulocytes: 0 %
Lymphocytes Relative: 20 %
Lymphs Abs: 1.3 10*3/uL (ref 0.7–4.0)
MCH: 26.8 pg (ref 26.0–34.0)
MCHC: 32.2 g/dL (ref 30.0–36.0)
MCV: 83.2 fL (ref 80.0–100.0)
Monocytes Absolute: 0.4 10*3/uL (ref 0.1–1.0)
Monocytes Relative: 6 %
Neutro Abs: 4.7 10*3/uL (ref 1.7–7.7)
Neutrophils Relative %: 72 %
Platelets: 226 10*3/uL (ref 150–400)
RBC: 4.29 MIL/uL (ref 3.87–5.11)
RDW: 13.2 % (ref 11.5–15.5)
WBC: 6.5 10*3/uL (ref 4.0–10.5)
nRBC: 0 % (ref 0.0–0.2)

## 2022-07-15 LAB — RAD ONC ARIA SESSION SUMMARY
Course Elapsed Days: 14
Plan Fractions Treated to Date: 6
Plan Prescribed Dose Per Fraction: 1.8 Gy
Plan Total Fractions Prescribed: 14
Plan Total Prescribed Dose: 25.2 Gy
Reference Point Dosage Given to Date: 10.8 Gy
Reference Point Session Dosage Given: 1.8 Gy
Session Number: 11

## 2022-07-15 LAB — COMPREHENSIVE METABOLIC PANEL
ALT: 12 U/L (ref 0–44)
AST: 16 U/L (ref 15–41)
Albumin: 3.9 g/dL (ref 3.5–5.0)
Alkaline Phosphatase: 65 U/L (ref 38–126)
Anion gap: 5 (ref 5–15)
BUN: 14 mg/dL (ref 6–20)
CO2: 29 mmol/L (ref 22–32)
Calcium: 9.2 mg/dL (ref 8.9–10.3)
Chloride: 104 mmol/L (ref 98–111)
Creatinine, Ser: 0.75 mg/dL (ref 0.44–1.00)
GFR, Estimated: >60 mL/min (ref >60)
Glucose, Bld: 84 mg/dL (ref 70–99)
Potassium: 4 mmol/L (ref 3.5–5.1)
Sodium: 138 mmol/L (ref 135–145)
Total Bilirubin: 0.6 mg/dL (ref 0.3–1.2)
Total Protein: 6 g/dL — ABNORMAL LOW (ref 6.5–8.1)

## 2022-07-15 MED ORDER — HEPARIN SOD (PORK) LOCK FLUSH 100 UNIT/ML IV SOLN
500.0000 [IU] | Freq: Once | INTRAVENOUS | Status: AC
  Administered 2022-07-15: 500 [IU]

## 2022-07-15 MED ORDER — SODIUM CHLORIDE 0.9% FLUSH
10.0000 mL | Freq: Once | INTRAVENOUS | Status: AC
  Administered 2022-07-15: 10 mL

## 2022-07-15 NOTE — Progress Notes (Signed)
Stephanie [name] Follow up:    Default, Provider, MD No address on file   DIAGNOSIS:  [name] Staging  Malignant neoplasm of upper-outer quadrant of left breast in female, estrogen receptor positive (HCC) Staging form: Breast, AJCC 8th Edition - Clinical: Stage IA (cT1c, cN0, cM0, G2, ER+, PR-, HER2+) - Signed by Stephanie Moulds, MD on 11/18/2021 Histologic grading system: 3 grade system   SUMMARY OF ONCOLOGIC HISTORY: Oncology History  Malignant neoplasm of upper-outer quadrant of female breast (HCC) (Resolved)  10/20/2018 Initial Diagnosis   Malignant neoplasm of upper-outer quadrant of left breast in female, estrogen receptor positive (HCC)   10/20/2018 [name] Staging   Staging form: Breast, AJCC 8th Edition - Clinical stage from 10/20/2018: Stage 0 (cTis (DCIS), cN0, cM0, ER+, PR-) - Signed by Stephanie Dell, MD on 02/13/2020 Stage prefix: Initial diagnosis   11/03/2018 Genetic Testing   Negative genetic testing:  No pathogenic variants detected on the [facility] Multi-[name] panel, ordered by Stephanie [name] at [facility]. The report date is 11/03/2018.  The Multi-[name] Panel offered by [facility] includes sequencing and/or deletion duplication testing of the following 84 genes: AIP, ALK, APC, ATM, AXIN2,BAP1,  BARD1, BLM, BMPR1A, BRCA1, BRCA2, BRIP1, CASR, CDC73, CDH1, CDK4, CDKN1B, CDKN1C, CDKN2A (p14ARF), CDKN2A (p16INK4a), CEBPA, CHEK2, CTNNA1, DICER1, DIS3L2, EGFR (c.2369C>T, p.Thr790Met variant only), EPCAM (Deletion/duplication testing only), FH, FLCN, GATA2, GPC3, GREM1 (Promoter region deletion/duplication testing only), HOXB13 (c.251G>A, p.Gly84Glu), HRAS, KIT, MAX, MEN1, MET, MITF (c.952G>A, p.Glu318Lys variant only), MLH1, MSH2, MSH3, MSH6, MUTYH, NBN, NF1, NF2, NTHL1, PALB2, PDGFRA, PHOX2B, PMS2, POLD1, POLE, POT1, PRKAR1A, PTCH1, PTEN, RAD50, RAD51C, RAD51D, RB1, RECQL4, RET, RUNX1, SDHAF2, SDHA (sequence changes only), SDHB, SDHC, SDHD, SMAD4,  SMARCA4, SMARCB1, SMARCE1, STK11, SUFU, TERC, TERT, TMEM127, TP53, TSC1, TSC2, VHL, WRN and WT1.     01/17/2019 [name] Staging   Staging form: Breast, AJCC 8th Edition - Pathologic stage from 01/17/2019: Stage IA (pT66mi, pN0, cM0, G2, ER+, PR-, HER2-) - Signed by Stephanie Socks, NP on 02/01/2019   Malignant neoplasm of upper-outer quadrant of left breast in female, estrogen receptor positive (HCC)  10/21/2021 Breast US   Breast ultrasound of the palpable mass showed hypoechoic oval vascular mass with indistinct margins measuring 1.5 x 1 cm x 1.4 cm.  No abnormal lymph nodes found in the left axilla.  She had ultrasound-guided biopsy.  MRI scheduled for tomorrow.   10/29/2021 Pathology Results   Surgical pathology from the left breast needle core biopsy at 130 o'clock showed invasive ductal carcinoma, overall grade 2, prognostics ER 70% positive weak to moderate staining, PR 0% negative, Ki-67 of 80% and HER2 3+   11/18/2021 [name] Staging   Staging form: Breast, AJCC 8th Edition - Clinical: Stage IA (cT1c, cN0, cM0, G2, ER+, PR-, HER2+) - Signed by Stephanie Moulds, MD on 11/18/2021 Histologic grading system: 3 grade system   11/26/2021 - 03/16/2022 Chemotherapy   Patient is on Treatment Plan : BREAST  Docetaxel + Carboplatin + Trastuzumab + Pertuzumab  (TCHP) q21d      04/03/2022 -  Chemotherapy   Patient is on Treatment Plan : BREAST Trastuzumab + Pertuzumab q21d x 11 cycles     04/30/2022 Surgery   Left breast lumpectomy: no residual [name] identified, margins negative, treatment effect present, 1SLN negative for [name].   06/24/2022 - 08/03/2022 Radiation Therapy   Adjuvant radiation therapy     CURRENT THERAPY: Herceptin/Perjeta   INTERVAL HISTORY:  Stephanie [name] 37 y.o. female returns for follow-up  prior to receiving her next cycle of Herceptin Perjeta.  Her most recent echocardiogram occurred on March 04, 2022.  This demonstrated a left ventricular ejection fraction of 60  to 65%.   Last LVEF May 2024, EF 65% She is now on adjuvant radiation, will complete 08/10/2022. She is here to talk about antiestrogen therapy. She is otherwise doing very well. She has recovered from chemotherapy very well, denies any residual side effects. She is going on a sabbatical for a few months. Rest of the pertinent 10 point ROS reviewed and neg.  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  MEDICAL HISTORY: Past Medical History:  Diagnosis Date   Atypical mole 03/14/2019   mod-left side superior   BRCA gene mutation negative in female    Breast [name] [facility])    Family history of breast [name]    Family history of lung [name]    Family history of rectal [name]    Melanoma (HCC) 10/14/2007   level II- right arm- (EXC)   PONV (postoperative nausea and vomiting)     SURGICAL HISTORY: Past Surgical History:  Procedure Laterality Date   BREAST BIOPSY Left 04/29/2022   [place] LT RADIOACTIVE SEED LOC 04/29/2022 [facility] MAMMOGRAPHY   BREAST LUMPECTOMY WITH RADIOACTIVE SEED AND SENTINEL LYMPH NODE BIOPSY Left 04/30/2022   Procedure: LEFT BREAST LUMPECTOMY WITH RADIOACTIVE SEED AND SENTINEL LYMPH NODE BIOPSY;  Surgeon: Stephanie Bouillon, MD;  Location: [facility];  Service: General;  Laterality: Left;   BREAST RECONSTRUCTION WITH PLACEMENT OF TISSUE EXPANDER AND ALLODERM Bilateral 01/17/2019   Procedure: BILATERAL BREAST RECONSTRUCTION WITH PLACEMENT OF TISSUE EXPANDER AND ALLODERM;  Surgeon: Stephanie Fellows, MD;  Location: [facility];  Service: Plastics;  Laterality: Bilateral;   IR IMAGING GUIDED PORT INSERTION  11/25/2021   IR RADIOLOGIST EVAL & MGMT  11/26/2021   LESION REMOVAL Left 06/02/2019   Procedure: MINOR EXICISION OF LESION,LAYERED CLOSURE 1 CM;  Surgeon: Stephanie Fellows, MD;  Location: [facility];  Service: Plastics;  Laterality: Left;   LIPOSUCTION WITH LIPOFILLING Bilateral 06/02/2019   Procedure: LIPOFILLING FROM ABDOMEN TO BILATERAL CHEST;  Surgeon: Stephanie Fellows, MD;  Location: [facility];  Service: Plastics;  Laterality: Bilateral;   melanoma surgery Right 2009   upper right arm   NIPPLE SPARING MASTECTOMY WITH SENTINEL LYMPH NODE BIOPSY Bilateral 01/17/2019   Procedure: BILATERAL NIPPLE SPARING MASTECTOMIES WITH LEFT SENTINEL LYMPH NODE MAPPING;  Surgeon: Stephanie Bouillon, MD;  Location: [facility];  Service: General;  Laterality: Bilateral;   REMOVAL OF BILATERAL TISSUE EXPANDERS WITH PLACEMENT OF BILATERAL BREAST IMPLANTS Bilateral 06/02/2019   Procedure: REMOVAL OF BILATERAL TISSUE EXPANDERS WITH PLACEMENT OF BILATERAL SILICONE BREAST IMPLANTS;  Surgeon: Stephanie Fellows, MD;  Location:  SURGERY CENTER;  Service: Plastics;  Laterality: Bilateral;   WISDOM TOOTH EXTRACTION      SOCIAL HISTORY: Social History   Socioeconomic History   Marital status: Married    Spouse name: Not  on file   Number of children: Not on file   Years of education: Not on file   Highest education level: Not on file  Occupational History   Not on file  Tobacco Use   Smoking status: Never   Smokeless tobacco: Never  Vaping Use   Vaping Use: Never used  Substance and Sexual Activity   Alcohol use: Yes    Comment: occasional   Drug use: Never   Sexual activity: Yes    Birth control/protection: None  Other Topics Concern   Not on file  Social History Narrative   Not on file   Social Determinants of Health    Financial Resource Strain: Not on file  Food Insecurity: No Food Insecurity (05/25/2022)   Hunger Vital Sign    Worried About Running Out of Food in the Last Year: Never true    Ran Out of Food in the Last Year: Never true  Transportation Needs: No Transportation Needs (05/25/2022)   PRAPARE - Administrator, Civil Service (Medical): No    Lack of Transportation (Non-Medical): No  Physical Activity: Not on file  Stress: Not on file  Social Connections: Not on file  Intimate Partner Violence: Not At Risk (05/25/2022)   Humiliation, Afraid, Rape, and Kick questionnaire    Fear of Current or Ex-Partner: No    Emotionally Abused: No    Physically Abused: No    Sexually Abused: No    FAMILY HISTORY: Family History  Problem Relation Age of Onset   Hypertension Mother    Heart disease Father    Breast [name] Maternal Aunt 30 - 69   Breast [name] Maternal Aunt 94 - 59   Lung [name] Paternal Aunt        diagnosed late 55s   Breast [name] Maternal Grandmother 41 - 69   Breast [name] Paternal Grandmother 64 - 108   Lung [name] Paternal Grandmother        thought to be breast [name] mets   Rectal [name] Paternal Grandmother        diagnosed 46s   Lung [name] Paternal Grandfather        diagnosed 28s    Review of Systems  Constitutional:  Negative for appetite change, chills, fatigue, fever and unexpected weight change.  HENT:   Negative for hearing loss, lump/mass and trouble swallowing.   Eyes:  Negative for eye problems and icterus.  Respiratory:  Negative for chest tightness, cough and shortness of breath.   Cardiovascular:  Negative for chest pain, leg swelling and palpitations.  Gastrointestinal:  Negative for abdominal distention, abdominal pain, constipation, diarrhea, nausea and vomiting.  Endocrine: Negative for hot flashes.  Genitourinary:  Negative for difficulty urinating.   Musculoskeletal:  Negative for arthralgias.  Skin:  Negative for itching and rash.   Neurological:  Negative for dizziness, extremity weakness, headaches and numbness.  Hematological:  Negative for adenopathy. Does not bruise/bleed easily.  Psychiatric/Behavioral:  Negative for depression. The patient is not nervous/anxious.       PHYSICAL EXAMINATION    Vitals:   07/15/22 1147  BP: 99/70  Pulse: (!) 55  Resp: 16  Temp: (!) 97.3 F (36.3 C)  SpO2: 100%    Physical Exam Constitutional:      General: She is not in acute distress.    Appearance: Normal appearance. She is not toxic-appearing.  HENT:     Head: Normocephalic and atraumatic.     Mouth/Throat:     Mouth: Mucous membranes  are moist.     Pharynx: Oropharynx is clear. No oropharyngeal exudate or posterior oropharyngeal erythema.  Eyes:     General: No scleral icterus. Cardiovascular:     Rate and Rhythm: Normal rate and regular rhythm.     Pulses: Normal pulses.     Heart sounds: Normal heart sounds.  Pulmonary:     Effort: Pulmonary effort is normal.     Breath sounds: Normal breath sounds.  Abdominal:     General: Abdomen is flat. Bowel sounds are normal. There is no distension.     Palpations: Abdomen is soft.     Tenderness: There is no abdominal tenderness.  Musculoskeletal:        General: No swelling.     Cervical back: Neck supple.  Lymphadenopathy:     Cervical: No cervical adenopathy.  Skin:    General: Skin is warm and dry.     Findings: No rash.  Neurological:     General: No focal deficit present.     Mental Status: She is alert.  Psychiatric:        Mood and Affect: Mood normal.        Behavior: Behavior normal.     LABORATORY DATA:  CBC    Component Value Date/Time   WBC 6.5 07/15/2022 1037   RBC 4.29 07/15/2022 1037   HGB 11.5 (L) 07/15/2022 1037   HGB 10.1 (L) 03/13/2022 1210   HCT 35.7 (L) 07/15/2022 1037   PLT 226 07/15/2022 1037   PLT 196 03/13/2022 1210   MCV 83.2 07/15/2022 1037   MCH 26.8 07/15/2022 1037   MCHC 32.2 07/15/2022 1037   RDW 13.2  07/15/2022 1037   LYMPHSABS 1.3 07/15/2022 1037   MONOABS 0.4 07/15/2022 1037   EOSABS 0.1 07/15/2022 1037   BASOSABS 0.0 07/15/2022 1037    CMP     Component Value Date/Time   NA 138 07/15/2022 1037   K 4.0 07/15/2022 1037   CL 104 07/15/2022 1037   CO2 29 07/15/2022 1037   GLUCOSE 84 07/15/2022 1037   BUN 14 07/15/2022 1037   CREATININE 0.75 07/15/2022 1037   CREATININE 0.79 03/13/2022 1210   CALCIUM 9.2 07/15/2022 1037   PROT 6.0 (L) 07/15/2022 1037   ALBUMIN 3.9 07/15/2022 1037   AST 16 07/15/2022 1037   AST 18 03/13/2022 1210   ALT 12 07/15/2022 1037   ALT 12 03/13/2022 1210   ALKPHOS 65 07/15/2022 1037   BILITOT 0.6 07/15/2022 1037   BILITOT 0.4 03/13/2022 1210   GFRNONAA >60 07/15/2022 1037   GFRNONAA >60 03/13/2022 1210   GFRAA >60 06/04/2019 0201      ASSESSMENT and THERAPY PLAN:   Malignant neoplasm of upper-outer quadrant of left breast in female, estrogen receptor positive (HCC) This is a very pleasant 37 year old premenopausal female patient with prior history of left breast DCIS with microinvasive component, negative margins now diagnosed with left breast upper outer quadrant invasive ductal carcinoma, overall grade 2, ER +70% weak to moderate staining, PR 0% negative, HER2 3+ by IHC, Ki-67 of 80% referred to breast oncology for neoadjuvant recommendations.  Since last visit, she had breast MRI which showed tumor size measuring up to 3.6 cm.  No regional adenopathy.   CT and bone scan with no evidence of metastatic disease. Started C1 of TCHP 11/26/2021. She completed 6 cycles of TCHP. She had complete pathologic response. She is now on adjuvant herceptin/perjeta She is now undergoing adjuvant radiation, will complete 08/10/2022. She will start  on Tamoxifen after completion of adjuvant radiation. We have today discussed the mechanism of action of tamoxifen, adverse effects of tamoxifen including but not limited to postmenopausal symptoms such as hot flashes,  vaginal discharge, risk of DVT/PE, endometrial hyperplasia and endometrial malignancy and benefit on bone density.  She is agreeable to this plan. Stephanie Moulds MD   All questions were answered. The patient knows to call the clinic with any problems, questions or concerns. We can certainly see the patient much sooner if necessary.  Total encounter time:30 minutes*in face-to-face visit time, chart review, lab review, care coordination, order entry, and documentation of the encounter time.   *Total Encounter Time as defined by the Centers for Medicare and Medicaid Services includes, in addition to the face-to-face time of a patient visit (documented in the note above) non-face-to-face time: obtaining and reviewing outside history, ordering and reviewing medications, tests or procedures, care coordination (communications with other health care professionals or caregivers) and documentation in the medical record.

## 2022-07-15 NOTE — Assessment & Plan Note (Addendum)
This is a very pleasant 37 year old premenopausal female patient with prior history of left breast DCIS with microinvasive component, negative margins now diagnosed with left breast upper outer quadrant invasive ductal carcinoma, overall grade 2, ER +70% weak to moderate staining, PR 0% negative, HER2 3+ by IHC, Ki-67 of 80% referred to breast oncology for neoadjuvant recommendations.  Since last visit, she had breast MRI which showed tumor size measuring up to 3.6 cm.  No regional adenopathy.   CT and bone scan with no evidence of metastatic disease. Started C1 of TCHP 11/26/2021. She completed 6 cycles of TCHP. She had complete pathologic response. She is now on adjuvant herceptin/perjeta She is now undergoing adjuvant radiation, will complete 08/10/2022. She will start on Tamoxifen after completion of adjuvant radiation. We have today discussed the mechanism of action of tamoxifen, adverse effects of tamoxifen including but not limited to postmenopausal symptoms such as hot flashes, vaginal discharge, risk of DVT/PE, endometrial hyperplasia and endometrial malignancy and benefit on bone density.  She is agreeable to this plan. Rachel Moulds MD

## 2022-07-16 ENCOUNTER — Other Ambulatory Visit: Payer: Self-pay

## 2022-07-17 ENCOUNTER — Other Ambulatory Visit: Payer: Self-pay

## 2022-07-17 ENCOUNTER — Other Ambulatory Visit: Payer: BC Managed Care – PPO

## 2022-07-17 ENCOUNTER — Inpatient Hospital Stay: Payer: BC Managed Care – PPO

## 2022-07-17 ENCOUNTER — Ambulatory Visit
Admission: RE | Admit: 2022-07-17 | Discharge: 2022-07-17 | Disposition: A | Discharge status: Home / Self Care | Payer: BC Managed Care – PPO | Source: Ambulatory Visit | Attending: Radiation Oncology | Admitting: Radiation Oncology

## 2022-07-17 ENCOUNTER — Ambulatory Visit: Payer: BC Managed Care – PPO | Admitting: Adult Health

## 2022-07-17 VITALS — BP 106/57 | HR 54 | Temp 97.5°F | Resp 16 | Wt 155.1 lb

## 2022-07-17 DIAGNOSIS — D0512 Intraductal carcinoma in situ of left breast: Secondary | ICD-10-CM | POA: Diagnosis not present

## 2022-07-17 DIAGNOSIS — C50412 Malignant neoplasm of upper-outer quadrant of left female breast: Secondary | ICD-10-CM

## 2022-07-17 LAB — RAD ONC ARIA SESSION SUMMARY
Course Elapsed Days: 16
Plan Fractions Treated to Date: 6
Plan Prescribed Dose Per Fraction: 1.8 Gy
Plan Total Fractions Prescribed: 14
Plan Total Prescribed Dose: 25.2 Gy
Reference Point Dosage Given to Date: 10.8 Gy
Reference Point Session Dosage Given: 1.8 Gy
Session Number: 12

## 2022-07-17 MED ORDER — SODIUM CHLORIDE 0.9 % IV SOLN
420.0000 mg | Freq: Once | INTRAVENOUS | Status: AC
  Administered 2022-07-17: 420 mg via INTRAVENOUS
  Filled 2022-07-17: qty 14

## 2022-07-17 MED ORDER — SODIUM CHLORIDE 0.9 % IV SOLN: Freq: Once | INTRAVENOUS | Status: AC

## 2022-07-17 MED ORDER — TRASTUZUMAB-ANNS CHEMO 150 MG IV SOLR
6.0000 mg/kg | Freq: Once | INTRAVENOUS | Status: AC
  Administered 2022-07-17: 420 mg via INTRAVENOUS
  Filled 2022-07-17: qty 20

## 2022-07-17 MED ORDER — ACETAMINOPHEN 325 MG PO TABS
650.0000 mg | ORAL_TABLET | Freq: Once | ORAL | Status: AC
  Administered 2022-07-17: 650 mg via ORAL
  Filled 2022-07-17: qty 2

## 2022-07-17 MED ORDER — HEPARIN SOD (PORK) LOCK FLUSH 100 UNIT/ML IV SOLN
500.0000 [IU] | Freq: Once | INTRAVENOUS | Status: AC | PRN
  Administered 2022-07-17: 500 [IU]

## 2022-07-17 MED ORDER — SODIUM CHLORIDE 0.9% FLUSH
10.0000 mL | INTRAVENOUS | Status: DC | PRN
  Administered 2022-07-17: 10 mL

## 2022-07-17 MED ORDER — DIPHENHYDRAMINE HCL 25 MG PO CAPS
50.0000 mg | ORAL_CAPSULE | Freq: Once | ORAL | Status: AC
  Administered 2022-07-17: 50 mg via ORAL
  Filled 2022-07-17: qty 2

## 2022-07-17 NOTE — Patient Instructions (Signed)
[facility]  Discharge Instructions: Thank you for choosing [facility] to provide your oncology and hematology care.   If you have a lab appointment with the Cancer Center, please go directly to the Cancer Center and check in at the registration area.   Wear comfortable clothing and clothing appropriate for easy access to any Portacath or PICC line.   We strive to give you quality time with your provider. You may need to reschedule your appointment if you arrive late (15 or more minutes).  Arriving late affects you and other patients whose appointments are after yours.  Also, if you miss three or more appointments without notifying the office, you may be dismissed from the clinic at the provider's discretion.      For prescription refill requests, have your pharmacy contact our office and allow 72 hours for refills to be completed.    Today you received the following chemotherapy and/or immunotherapy agents: trastuzumab and pertuzumab      To help prevent nausea and vomiting after your treatment, we encourage you to take your nausea medication as directed.  BELOW ARE SYMPTOMS THAT SHOULD BE REPORTED IMMEDIATELY: *FEVER GREATER THAN 100.4 F (38 C) OR HIGHER *CHILLS OR SWEATING *NAUSEA AND VOMITING THAT IS NOT CONTROLLED WITH YOUR NAUSEA MEDICATION *UNUSUAL SHORTNESS OF BREATH *UNUSUAL BRUISING OR BLEEDING *URINARY PROBLEMS (pain or burning when urinating, or frequent urination) *BOWEL PROBLEMS (unusual diarrhea, constipation, pain near the anus) TENDERNESS IN MOUTH AND THROAT WITH OR WITHOUT PRESENCE OF ULCERS (sore throat, sores in mouth, or a toothache) UNUSUAL RASH, SWELLING OR PAIN  UNUSUAL VAGINAL DISCHARGE OR ITCHING   Items with * indicate a potential emergency and should be followed up as soon as possible or go to the Emergency Department if any problems should occur.  Please show the CHEMOTHERAPY ALERT CARD or IMMUNOTHERAPY  ALERT CARD at check-in to the Emergency Department and triage nurse.  Should you have questions after your visit or need to cancel or reschedule your appointment, please contact [facility]  Dept: [phone]  and follow the prompts.  Office hours are 8:00 a.m. to 4:30 p.m. Monday - Friday. Please note that voicemails left after 4:00 p.m. may not be returned until the following business day.  We are closed weekends and major holidays. You have access to a nurse at all times for urgent questions. Please call the main number to the clinic Dept: [phone] and follow the prompts.   For any non-urgent questions, you may also contact your provider using MyChart. We now offer e-Visits for anyone 18 and older to request care online for non-urgent symptoms. For details visit [email or web address].   Also download the MyChart app! Go to the app store, search "MyChart", open the app, select , and log in with your MyChart username and password.   

## 2022-07-20 ENCOUNTER — Ambulatory Visit
Admission: RE | Admit: 2022-07-20 | Discharge: 2022-07-20 | Disposition: A | Discharge status: Home / Self Care | Payer: BC Managed Care – PPO | Source: Ambulatory Visit | Attending: Radiation Oncology | Admitting: Radiation Oncology

## 2022-07-20 ENCOUNTER — Telehealth: Payer: Self-pay

## 2022-07-20 ENCOUNTER — Other Ambulatory Visit: Payer: Self-pay

## 2022-07-20 DIAGNOSIS — D0512 Intraductal carcinoma in situ of left breast: Secondary | ICD-10-CM | POA: Diagnosis not present

## 2022-07-20 LAB — RAD ONC ARIA SESSION SUMMARY
Course Elapsed Days: 19
Plan Fractions Treated to Date: 7
Plan Prescribed Dose Per Fraction: 1.8 Gy
Plan Total Fractions Prescribed: 14
Plan Total Prescribed Dose: 25.2 Gy
Reference Point Dosage Given to Date: 12.6 Gy
Reference Point Session Dosage Given: 1.8 Gy
Session Number: 13

## 2022-07-20 NOTE — Telephone Encounter (Signed)
Notified Patient of completion of Short Term Disability Form. Fax transmission confirmation received. Copy of form emailed to Patient as requested. No other needs or concerns voiced at this time. 

## 2022-07-21 ENCOUNTER — Other Ambulatory Visit: Payer: Self-pay

## 2022-07-21 ENCOUNTER — Ambulatory Visit: Admission: RE | Admit: 2022-07-21 | Payer: BC Managed Care – PPO | Source: Ambulatory Visit

## 2022-07-21 DIAGNOSIS — D0512 Intraductal carcinoma in situ of left breast: Secondary | ICD-10-CM | POA: Diagnosis not present

## 2022-07-21 LAB — RAD ONC ARIA SESSION SUMMARY
Course Elapsed Days: 20
Plan Fractions Treated to Date: 7
Plan Prescribed Dose Per Fraction: 1.8 Gy
Plan Total Fractions Prescribed: 14
Plan Total Prescribed Dose: 25.2 Gy
Reference Point Dosage Given to Date: 12.6 Gy
Reference Point Session Dosage Given: 1.8 Gy
Session Number: 14

## 2022-07-22 ENCOUNTER — Ambulatory Visit
Admission: RE | Admit: 2022-07-22 | Discharge: 2022-07-22 | Disposition: A | Discharge status: Home / Self Care | Payer: BC Managed Care – PPO | Source: Ambulatory Visit | Attending: Radiation Oncology | Admitting: Radiation Oncology

## 2022-07-22 ENCOUNTER — Other Ambulatory Visit: Payer: Self-pay

## 2022-07-22 DIAGNOSIS — D0512 Intraductal carcinoma in situ of left breast: Secondary | ICD-10-CM | POA: Diagnosis not present

## 2022-07-22 LAB — RAD ONC ARIA SESSION SUMMARY
Course Elapsed Days: 21
Plan Fractions Treated to Date: 8
Plan Prescribed Dose Per Fraction: 1.8 Gy
Plan Total Fractions Prescribed: 14
Plan Total Prescribed Dose: 25.2 Gy
Reference Point Dosage Given to Date: 14.4 Gy
Reference Point Session Dosage Given: 1.8 Gy
Session Number: 15

## 2022-07-23 ENCOUNTER — Ambulatory Visit
Admission: RE | Admit: 2022-07-23 | Discharge: 2022-07-23 | Disposition: A | Discharge status: Home / Self Care | Payer: BC Managed Care – PPO | Source: Ambulatory Visit | Attending: Radiation Oncology | Admitting: Radiation Oncology

## 2022-07-23 ENCOUNTER — Other Ambulatory Visit: Payer: Self-pay

## 2022-07-23 DIAGNOSIS — D0512 Intraductal carcinoma in situ of left breast: Secondary | ICD-10-CM | POA: Diagnosis not present

## 2022-07-23 LAB — RAD ONC ARIA SESSION SUMMARY
Course Elapsed Days: 22
Plan Fractions Treated to Date: 8
Plan Prescribed Dose Per Fraction: 1.8 Gy
Plan Total Fractions Prescribed: 14
Plan Total Prescribed Dose: 25.2 Gy
Reference Point Dosage Given to Date: 14.4 Gy
Reference Point Session Dosage Given: 1.8 Gy
Session Number: 16

## 2022-07-24 ENCOUNTER — Other Ambulatory Visit: Payer: Self-pay

## 2022-07-24 ENCOUNTER — Ambulatory Visit
Admission: RE | Admit: 2022-07-24 | Discharge: 2022-07-24 | Disposition: A | Discharge status: Home / Self Care | Payer: BC Managed Care – PPO | Source: Ambulatory Visit | Attending: Radiation Oncology | Admitting: Radiation Oncology

## 2022-07-24 DIAGNOSIS — D0512 Intraductal carcinoma in situ of left breast: Secondary | ICD-10-CM | POA: Diagnosis not present

## 2022-07-24 LAB — RAD ONC ARIA SESSION SUMMARY
Course Elapsed Days: 23
Plan Fractions Treated to Date: 9
Plan Prescribed Dose Per Fraction: 1.8 Gy
Plan Total Fractions Prescribed: 14
Plan Total Prescribed Dose: 25.2 Gy
Reference Point Dosage Given to Date: 16.2 Gy
Reference Point Session Dosage Given: 1.8 Gy
Session Number: 17

## 2022-07-27 ENCOUNTER — Ambulatory Visit
Admission: RE | Admit: 2022-07-27 | Discharge: 2022-07-27 | Disposition: A | Discharge status: Home / Self Care | Payer: BC Managed Care – PPO | Source: Ambulatory Visit | Attending: Radiation Oncology | Admitting: Radiation Oncology

## 2022-07-27 ENCOUNTER — Other Ambulatory Visit: Payer: Self-pay

## 2022-07-27 ENCOUNTER — Ambulatory Visit: Admission: RE | Admit: 2022-07-27 | Payer: BC Managed Care – PPO | Source: Ambulatory Visit

## 2022-07-27 DIAGNOSIS — D0512 Intraductal carcinoma in situ of left breast: Secondary | ICD-10-CM | POA: Diagnosis not present

## 2022-07-27 LAB — RAD ONC ARIA SESSION SUMMARY
Course Elapsed Days: 26
Plan Fractions Treated to Date: 9
Plan Prescribed Dose Per Fraction: 1.8 Gy
Plan Total Fractions Prescribed: 14
Plan Total Prescribed Dose: 25.2 Gy
Reference Point Dosage Given to Date: 16.2 Gy
Reference Point Session Dosage Given: 1.8 Gy
Session Number: 18

## 2022-07-28 ENCOUNTER — Other Ambulatory Visit: Payer: Self-pay

## 2022-07-28 ENCOUNTER — Ambulatory Visit
Admission: RE | Admit: 2022-07-28 | Discharge: 2022-07-28 | Disposition: A | Discharge status: Home / Self Care | Payer: BC Managed Care – PPO | Source: Ambulatory Visit | Attending: Radiation Oncology | Admitting: Radiation Oncology

## 2022-07-28 DIAGNOSIS — D0512 Intraductal carcinoma in situ of left breast: Secondary | ICD-10-CM | POA: Diagnosis not present

## 2022-07-28 LAB — RAD ONC ARIA SESSION SUMMARY
Course Elapsed Days: 27
Plan Fractions Treated to Date: 10
Plan Prescribed Dose Per Fraction: 1.8 Gy
Plan Total Fractions Prescribed: 14
Plan Total Prescribed Dose: 25.2 Gy
Reference Point Dosage Given to Date: 18 Gy
Reference Point Session Dosage Given: 1.8 Gy
Session Number: 19

## 2022-07-29 ENCOUNTER — Other Ambulatory Visit: Payer: Self-pay

## 2022-07-29 ENCOUNTER — Ambulatory Visit
Admission: RE | Admit: 2022-07-29 | Discharge: 2022-07-29 | Disposition: A | Discharge status: Home / Self Care | Payer: BC Managed Care – PPO | Source: Ambulatory Visit | Attending: Radiation Oncology | Admitting: Radiation Oncology

## 2022-07-29 DIAGNOSIS — D0512 Intraductal carcinoma in situ of left breast: Secondary | ICD-10-CM | POA: Diagnosis not present

## 2022-07-29 LAB — RAD ONC ARIA SESSION SUMMARY
Course Elapsed Days: 28
Plan Fractions Treated to Date: 10
Plan Prescribed Dose Per Fraction: 1.8 Gy
Plan Total Fractions Prescribed: 14
Plan Total Prescribed Dose: 25.2 Gy
Reference Point Dosage Given to Date: 18 Gy
Reference Point Session Dosage Given: 1.8 Gy
Session Number: 20

## 2022-07-30 ENCOUNTER — Ambulatory Visit
Admission: RE | Admit: 2022-07-30 | Discharge: 2022-07-30 | Disposition: A | Discharge status: Home / Self Care | Payer: BC Managed Care – PPO | Source: Ambulatory Visit | Attending: Radiation Oncology | Admitting: Radiation Oncology

## 2022-07-30 ENCOUNTER — Other Ambulatory Visit: Payer: Self-pay

## 2022-07-30 DIAGNOSIS — D0512 Intraductal carcinoma in situ of left breast: Secondary | ICD-10-CM | POA: Diagnosis not present

## 2022-07-30 LAB — RAD ONC ARIA SESSION SUMMARY
Course Elapsed Days: 29
Plan Fractions Treated to Date: 11
Plan Prescribed Dose Per Fraction: 1.8 Gy
Plan Total Fractions Prescribed: 14
Plan Total Prescribed Dose: 25.2 Gy
Reference Point Dosage Given to Date: 19.8 Gy
Reference Point Session Dosage Given: 1.8 Gy
Session Number: 21

## 2022-07-31 ENCOUNTER — Ambulatory Visit
Admission: RE | Admit: 2022-07-31 | Discharge: 2022-07-31 | Disposition: A | Discharge status: Home / Self Care | Payer: BC Managed Care – PPO | Source: Ambulatory Visit | Attending: Radiation Oncology | Admitting: Radiation Oncology

## 2022-07-31 ENCOUNTER — Other Ambulatory Visit: Payer: Self-pay

## 2022-08-03 ENCOUNTER — Ambulatory Visit
Admission: RE | Admit: 2022-08-03 | Discharge: 2022-08-03 | Disposition: A | Discharge status: Home / Self Care | Payer: BC Managed Care – PPO | Source: Ambulatory Visit | Attending: Radiation Oncology | Admitting: Radiation Oncology

## 2022-08-03 ENCOUNTER — Other Ambulatory Visit: Payer: Self-pay

## 2022-08-03 ENCOUNTER — Ambulatory Visit: Payer: BC Managed Care – PPO | Attending: Hematology and Oncology | Admitting: Rehabilitation

## 2022-08-03 ENCOUNTER — Ambulatory Visit: Payer: BC Managed Care – PPO

## 2022-08-03 ENCOUNTER — Encounter: Payer: Self-pay | Admitting: Rehabilitation

## 2022-08-03 DIAGNOSIS — D0512 Intraductal carcinoma in situ of left breast: Secondary | ICD-10-CM | POA: Diagnosis not present

## 2022-08-03 DIAGNOSIS — Z483 Aftercare following surgery for neoplasm: Secondary | ICD-10-CM

## 2022-08-03 DIAGNOSIS — C50412 Malignant neoplasm of upper-outer quadrant of left female breast: Secondary | ICD-10-CM | POA: Insufficient documentation

## 2022-08-03 DIAGNOSIS — Z170 Estrogen receptor positive status [ER+]: Secondary | ICD-10-CM

## 2022-08-03 LAB — RAD ONC ARIA SESSION SUMMARY
Course Elapsed Days: 33
Plan Fractions Treated to Date: 11
Plan Prescribed Dose Per Fraction: 1.8 Gy
Plan Total Fractions Prescribed: 14
Plan Total Prescribed Dose: 25.2 Gy
Reference Point Dosage Given to Date: 19.8 Gy
Reference Point Session Dosage Given: 1.8 Gy
Session Number: 22

## 2022-08-03 MED ORDER — RADIAPLEXRX EX GEL: Freq: Once | CUTANEOUS | Status: AC

## 2022-08-03 NOTE — Therapy (Signed)
OUTPATIENT PHYSICAL THERAPY SOZO SCREENING NOTE   Patient Name: Stephanie [name] MRN: [id number] DOB:03-Feb-1985, 37 y.o., female Today's Date: 08/03/2022  PCP: Default, Provider, MD REFERRING PROVIDER: Rachel Moulds, MD   PT End of Session - 08/03/22 0846     Visit Number 2   screen only   PT Start Time 0843    PT Stop Time 0846    PT Time Calculation (min) 3 min    Activity Tolerance Patient tolerated treatment well    Behavior During Therapy [facility] for tasks assessed/performed             Past Medical History:  Diagnosis Date   Atypical mole 03/14/2019   mod-left side superior   BRCA gene mutation negative in female    Breast cancer [facility])    Family history of breast cancer    Family history of lung cancer    Family history of rectal cancer    Melanoma (HCC) 10/14/2007   level II- right arm- (EXC)   PONV (postoperative nausea and vomiting)    Past Surgical History:  Procedure Laterality Date   BREAST BIOPSY Left 04/29/2022   [place] LT RADIOACTIVE SEED LOC 04/29/2022 [facility]AMMOGRAPHY   BREAST LUMPECTOMY WITH RADIOACTIVE SEED AND SENTINEL LYMPH NODE BIOPSY Left 04/30/2022   Procedure: LEFT BREAST LUMPECTOMY WITH RADIOACTIVE SEED AND SENTINEL LYMPH NODE BIOPSY;  Surgeon: [name], MD;  Location: [facility];  Service: General;  Laterality: Left;   BREAST RECONSTRUCTION WITH PLACEMENT OF TISSUE EXPANDER AND ALLODERM Bilateral 01/17/2019   Procedure: BILATERAL BREAST RECONSTRUCTION WITH PLACEMENT OF TISSUE EXPANDER AND ALLODERM;  Surgeon: Glenna Fellows, MD;  Location: [facility];  Service: Plastics;  Laterality: Bilateral;   IR IMAGING GUIDED PORT INSERTION  11/25/2021   IR RADIOLOGIST EVAL & MGMT  11/26/2021   LESION REMOVAL Left 06/02/2019   Procedure: MINOR EXICISION OF LESION,LAYERED CLOSURE 1 CM;  Surgeon: Glenna Fellows, MD;  Location: [facility];  Service: Plastics;  Laterality: Left;   LIPOSUCTION WITH LIPOFILLING  Bilateral 06/02/2019   Procedure: LIPOFILLING FROM ABDOMEN TO BILATERAL CHEST;  Surgeon: Glenna Fellows, MD;  Location: [facility];  Service: Plastics;  Laterality: Bilateral;   melanoma surgery Right 2009   upper right arm   NIPPLE SPARING MASTECTOMY WITH SENTINEL LYMPH NODE BIOPSY Bilateral 01/17/2019   Procedure: BILATERAL NIPPLE SPARING MASTECTOMIES WITH LEFT SENTINEL LYMPH NODE MAPPING;  Surgeon: [name], MD;  Location:  SURGERY CENTER;  Service: General;  Laterality: Bilateral;   REMOVAL OF BILATERAL TISSUE EXPANDERS WITH PLACEMENT OF BILATERAL BREAST IMPLANTS Bilateral 06/02/2019   Procedure: REMOVAL OF BILATERAL TISSUE EXPANDERS WITH PLACEMENT OF BILATERAL SILICONE BREAST IMPLANTS;  Surgeon: Glenna Fellows, MD;  Location:  SURGERY CENTER;  Service: Plastics;  Laterality: Bilateral;   WISDOM TOOTH EXTRACTION     Patient Active Problem List   Diagnosis Date Noted   Port-A-Cath in place 05/14/2022   Malignant neoplasm of upper-outer quadrant of left breast in female, estrogen receptor positive (HCC) 11/18/2021   Family history of breast cancer    Family history of rectal cancer    Family history of lung cancer    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 It is RE positive, PR neg, HER2pos with a Ki67 of 80%. She was treated for left breast cancer DCIS in 2020 where she underwent bilateral nipple sparing mastectomies with implants and Lt SLNB 4 negative nodes rermoved. No radiation completed. Pt is now done on  TCHP. left lumpectomy with SLNB on 04/30/22 with 1 negative node removed.. Will also be having radiation.  No episodes of arm swelling.    PRECAUTIONS: left UE Lymphedema risk,  SUBJECTIVE: finishing radiation   PAIN:  Are you having pain? Mild from radiation   SOZO SCREENING: Patient was assessed today using the SOZO machine to determine the lymphedema index score. This was compared to her baseline score. It was determined that she is within the recommended range when compared to her baseline and no further action is needed at this time. She will continue SOZO screenings. These are done every 3 months for 2 years post operatively followed by every 6 months for 2 years, and then annually.     Idamae Lusher, PT 08/03/2022, 8:47 AM

## 2022-08-04 ENCOUNTER — Other Ambulatory Visit: Payer: Self-pay

## 2022-08-04 ENCOUNTER — Encounter: Payer: Self-pay | Admitting: *Deleted

## 2022-08-04 ENCOUNTER — Ambulatory Visit
Admission: RE | Admit: 2022-08-04 | Discharge: 2022-08-04 | Disposition: A | Discharge status: Home / Self Care | Payer: BC Managed Care – PPO | Source: Ambulatory Visit | Attending: Radiation Oncology | Admitting: Radiation Oncology

## 2022-08-04 DIAGNOSIS — D0512 Intraductal carcinoma in situ of left breast: Secondary | ICD-10-CM | POA: Diagnosis not present

## 2022-08-04 LAB — RAD ONC ARIA SESSION SUMMARY
Course Elapsed Days: 34
Plan Fractions Treated to Date: 12
Plan Prescribed Dose Per Fraction: 1.8 Gy
Plan Total Fractions Prescribed: 14
Plan Total Prescribed Dose: 25.2 Gy
Reference Point Dosage Given to Date: 21.6 Gy
Reference Point Session Dosage Given: 1.8 Gy
Session Number: 23

## 2022-08-05 ENCOUNTER — Other Ambulatory Visit: Payer: Self-pay

## 2022-08-05 ENCOUNTER — Ambulatory Visit
Admission: RE | Admit: 2022-08-05 | Discharge: 2022-08-05 | Disposition: A | Discharge status: Home / Self Care | Payer: BC Managed Care – PPO | Source: Ambulatory Visit | Attending: Radiation Oncology | Admitting: Radiation Oncology

## 2022-08-05 DIAGNOSIS — D0512 Intraductal carcinoma in situ of left breast: Secondary | ICD-10-CM | POA: Diagnosis not present

## 2022-08-05 LAB — RAD ONC ARIA SESSION SUMMARY
Course Elapsed Days: 35
Plan Fractions Treated to Date: 12
Plan Prescribed Dose Per Fraction: 1.8 Gy
Plan Total Fractions Prescribed: 14
Plan Total Prescribed Dose: 25.2 Gy
Reference Point Dosage Given to Date: 21.6 Gy
Reference Point Session Dosage Given: 1.8 Gy
Session Number: 24

## 2022-08-06 ENCOUNTER — Inpatient Hospital Stay: Payer: BC Managed Care – PPO | Admitting: Hematology and Oncology

## 2022-08-06 ENCOUNTER — Other Ambulatory Visit: Payer: Self-pay

## 2022-08-06 ENCOUNTER — Ambulatory Visit
Admission: RE | Admit: 2022-08-06 | Discharge: 2022-08-06 | Disposition: A | Discharge status: Home / Self Care | Payer: BC Managed Care – PPO | Source: Ambulatory Visit | Attending: Radiation Oncology | Admitting: Radiation Oncology

## 2022-08-06 ENCOUNTER — Inpatient Hospital Stay: Payer: BC Managed Care – PPO

## 2022-08-06 DIAGNOSIS — Z170 Estrogen receptor positive status [ER+]: Secondary | ICD-10-CM

## 2022-08-06 DIAGNOSIS — C50412 Malignant neoplasm of upper-outer quadrant of left female breast: Secondary | ICD-10-CM

## 2022-08-06 DIAGNOSIS — Z95828 Presence of other vascular implants and grafts: Secondary | ICD-10-CM

## 2022-08-06 DIAGNOSIS — D0512 Intraductal carcinoma in situ of left breast: Secondary | ICD-10-CM | POA: Diagnosis not present

## 2022-08-06 LAB — RAD ONC ARIA SESSION SUMMARY
Course Elapsed Days: 36
Plan Fractions Treated to Date: 13
Plan Prescribed Dose Per Fraction: 1.8 Gy
Plan Total Fractions Prescribed: 14
Plan Total Prescribed Dose: 25.2 Gy
Reference Point Dosage Given to Date: 23.4 Gy
Reference Point Session Dosage Given: 1.8 Gy
Session Number: 25

## 2022-08-06 LAB — COMPREHENSIVE METABOLIC PANEL
ALT: 11 U/L (ref 0–44)
AST: 18 U/L (ref 15–41)
Albumin: 4 g/dL (ref 3.5–5.0)
Alkaline Phosphatase: 60 U/L (ref 38–126)
Anion gap: 5 (ref 5–15)
BUN: 16 mg/dL (ref 6–20)
CO2: 28 mmol/L (ref 22–32)
Calcium: 9 mg/dL (ref 8.9–10.3)
Chloride: 106 mmol/L (ref 98–111)
Creatinine, Ser: 0.82 mg/dL (ref 0.44–1.00)
GFR, Estimated: >60 mL/min (ref >60)
Glucose, Bld: 86 mg/dL (ref 70–99)
Potassium: 4 mmol/L (ref 3.5–5.1)
Sodium: 139 mmol/L (ref 135–145)
Total Bilirubin: 0.6 mg/dL (ref 0.3–1.2)
Total Protein: 6.1 g/dL — ABNORMAL LOW (ref 6.5–8.1)

## 2022-08-06 LAB — CBC WITH DIFFERENTIAL/PLATELET
Abs Immature Granulocytes: 0.02 10*3/uL (ref 0.00–0.07)
Basophils Absolute: 0 10*3/uL (ref 0.0–0.1)
Basophils Relative: 1 %
Eosinophils Absolute: 0.1 10*3/uL (ref 0.0–0.5)
Eosinophils Relative: 3 %
HCT: 37.4 % (ref 36.0–46.0)
Hemoglobin: 12.1 g/dL (ref 12.0–15.0)
Immature Granulocytes: 1 %
Lymphocytes Relative: 21 %
Lymphs Abs: 0.8 10*3/uL (ref 0.7–4.0)
MCH: 26.9 pg (ref 26.0–34.0)
MCHC: 32.4 g/dL (ref 30.0–36.0)
MCV: 83.1 fL (ref 80.0–100.0)
Monocytes Absolute: 0.3 10*3/uL (ref 0.1–1.0)
Monocytes Relative: 7 %
Neutro Abs: 2.7 10*3/uL (ref 1.7–7.7)
Neutrophils Relative %: 67 %
Platelets: 230 10*3/uL (ref 150–400)
RBC: 4.5 MIL/uL (ref 3.87–5.11)
RDW: 14 % (ref 11.5–15.5)
WBC: 3.9 10*3/uL — ABNORMAL LOW (ref 4.0–10.5)
nRBC: 0 % (ref 0.0–0.2)

## 2022-08-06 MED ORDER — TRASTUZUMAB-ANNS CHEMO 150 MG IV SOLR
6.0000 mg/kg | Freq: Once | INTRAVENOUS | Status: AC
  Administered 2022-08-06: 420 mg via INTRAVENOUS
  Filled 2022-08-06: qty 20

## 2022-08-06 MED ORDER — DIPHENHYDRAMINE HCL 25 MG PO CAPS
50.0000 mg | ORAL_CAPSULE | Freq: Once | ORAL | Status: AC
  Administered 2022-08-06: 50 mg via ORAL
  Filled 2022-08-06: qty 2

## 2022-08-06 MED ORDER — ACETAMINOPHEN 325 MG PO TABS
650.0000 mg | ORAL_TABLET | Freq: Once | ORAL | Status: AC
  Administered 2022-08-06: 650 mg via ORAL
  Filled 2022-08-06: qty 2

## 2022-08-06 MED ORDER — TAMOXIFEN CITRATE 20 MG PO TABS: 20.0000 mg | ORAL_TABLET | Freq: Every day | ORAL | 3 refills | Status: DC

## 2022-08-06 MED ORDER — SODIUM CHLORIDE 0.9% FLUSH
10.0000 mL | Freq: Once | INTRAVENOUS | Status: AC
  Administered 2022-08-06: 10 mL

## 2022-08-06 MED ORDER — SODIUM CHLORIDE 0.9 % IV SOLN: Freq: Once | INTRAVENOUS | Status: AC

## 2022-08-06 MED ORDER — SODIUM CHLORIDE 0.9 % IV SOLN
420.0000 mg | Freq: Once | INTRAVENOUS | Status: AC
  Administered 2022-08-06: 420 mg via INTRAVENOUS
  Filled 2022-08-06: qty 14

## 2022-08-06 NOTE — Progress Notes (Signed)
[facility] Cancer Follow up:    Default, Provider, MD No address on file   DIAGNOSIS:  Cancer Staging  Malignant neoplasm of upper-outer quadrant of left breast in female, estrogen receptor positive (HCC) Staging form: Breast, AJCC 8th Edition - Clinical: Stage IA (cT1c, cN0, cM0, G2, ER+, PR-, HER2+) - Signed by Rachel Moulds, MD on 11/18/2021 Histologic grading system: 3 grade system   SUMMARY OF ONCOLOGIC HISTORY: Oncology History  Malignant neoplasm of upper-outer quadrant of female breast (HCC) (Resolved)  10/20/2018 Initial Diagnosis   Malignant neoplasm of upper-outer quadrant of left breast in female, estrogen receptor positive (HCC)   10/20/2018 Cancer Staging   Staging form: Breast, AJCC 8th Edition - Clinical stage from 10/20/2018: Stage 0 (cTis (DCIS), cN0, cM0, ER+, PR-) - Signed by Lowella Dell, MD on 02/13/2020 Stage prefix: Initial diagnosis   11/03/2018 Genetic Testing   Negative genetic testing:  No pathogenic variants detected on the [facility] Multi-Cancer panel, ordered by Dr. Juliene Pina at [facility]. The report date is 11/03/2018.  The Multi-Cancer Panel offered by [facility] includes sequencing and/or deletion duplication testing of the following 84 genes: AIP, ALK, APC, ATM, AXIN2,BAP1,  BARD1, BLM, BMPR1A, BRCA1, BRCA2, BRIP1, CASR, CDC73, CDH1, CDK4, CDKN1B, CDKN1C, CDKN2A (p14ARF), CDKN2A (p16INK4a), CEBPA, CHEK2, CTNNA1, DICER1, DIS3L2, EGFR (c.2369C>T, p.Thr790Met variant only), EPCAM (Deletion/duplication testing only), FH, FLCN, GATA2, GPC3, GREM1 (Promoter region deletion/duplication testing only), HOXB13 (c.251G>A, p.Gly84Glu), HRAS, KIT, MAX, MEN1, MET, MITF (c.952G>A, p.Glu318Lys variant only), MLH1, MSH2, MSH3, MSH6, MUTYH, NBN, NF1, NF2, NTHL1, PALB2, PDGFRA, PHOX2B, PMS2, POLD1, POLE, POT1, PRKAR1A, PTCH1, PTEN, RAD50, RAD51C, RAD51D, RB1, RECQL4, RET, RUNX1, SDHAF2, SDHA (sequence changes only), SDHB, SDHC, SDHD, SMAD4,  SMARCA4, SMARCB1, SMARCE1, STK11, SUFU, TERC, TERT, TMEM127, TP53, TSC1, TSC2, VHL, WRN and WT1.     01/17/2019 Cancer Staging   Staging form: Breast, AJCC 8th Edition - Pathologic stage from 01/17/2019: Stage IA (pT52mi, pN0, cM0, G2, ER+, PR-, HER2-) - Signed by Loa Socks, NP on 02/01/2019   Malignant neoplasm of upper-outer quadrant of left breast in female, estrogen receptor positive (HCC)  10/21/2021 Breast US   Breast ultrasound of the palpable mass showed hypoechoic oval vascular mass with indistinct margins measuring 1.5 x 1 cm x 1.4 cm.  No abnormal lymph nodes found in the left axilla.  She had ultrasound-guided biopsy.  MRI scheduled for tomorrow.   10/29/2021 Pathology Results   Surgical pathology from the left breast needle core biopsy at 130 o'clock showed invasive ductal carcinoma, overall grade 2, prognostics ER 70% positive weak to moderate staining, PR 0% negative, Ki-67 of 80% and HER2 3+   11/18/2021 Cancer Staging   Staging form: Breast, AJCC 8th Edition - Clinical: Stage IA (cT1c, cN0, cM0, G2, ER+, PR-, HER2+) - Signed by Rachel Moulds, MD on 11/18/2021 Histologic grading system: 3 grade system   11/26/2021 - 03/16/2022 Chemotherapy   Patient is on Treatment Plan : BREAST  Docetaxel + Carboplatin + Trastuzumab + Pertuzumab  (TCHP) q21d      04/03/2022 -  Chemotherapy   Patient is on Treatment Plan : BREAST Trastuzumab + Pertuzumab q21d x 11 cycles     04/30/2022 Surgery   Left breast lumpectomy: no residual cancer identified, margins negative, treatment effect present, 1SLN negative for cancer.   06/24/2022 - 08/03/2022 Radiation Therapy   Adjuvant radiation therapy     CURRENT THERAPY: Herceptin/Perjeta   INTERVAL HISTORY:  Stephanie [name] 37 y.o. female returns for follow-up  prior to receiving her next cycle of Herceptin Perjeta.  Her most recent echocardiogram occurred on March 04, 2022.  This demonstrated a left ventricular ejection fraction of 60  to 65%.   Last LVEF May 2024, EF 65% She is now on adjuvant radiation, will complete 08/10/2022. She is here to talk about antiestrogen therapy.  Since her last visit, she complains of increased diarrhea, she apparent has a porter potty in her car and had to use that a couple times in the last week because of the urgency.  She also has noticed some increased dizziness especially when she changes position and she had history of vertigo.  She had to use her Antivert a couple times.  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 Rest of the pertinent 10 point ROS reviewed and neg.  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  MEDICAL HISTORY: Past Medical History:  Diagnosis Date   Atypical mole 03/14/2019   mod-left side superior   BRCA gene mutation negative in female    Breast cancer [facility])    Family history of breast cancer    Family history of lung cancer    Family history of rectal cancer    Melanoma (HCC) 10/14/2007   level II- right arm- (EXC)   PONV (postoperative nausea and vomiting)     SURGICAL HISTORY: Past Surgical History:  Procedure Laterality Date   BREAST BIOPSY Left 04/29/2022   [place] LT RADIOACTIVE SEED LOC 04/29/2022 [facility] MAMMOGRAPHY   BREAST LUMPECTOMY WITH RADIOACTIVE SEED AND SENTINEL LYMPH NODE BIOPSY Left 04/30/2022   Procedure: LEFT BREAST LUMPECTOMY WITH RADIOACTIVE SEED AND SENTINEL LYMPH NODE BIOPSY;  Surgeon: [name], MD;  Location: [facility];  Service: General;  Laterality: Left;   BREAST RECONSTRUCTION WITH PLACEMENT OF TISSUE EXPANDER AND ALLODERM Bilateral 01/17/2019   Procedure: BILATERAL BREAST RECONSTRUCTION WITH PLACEMENT OF TISSUE EXPANDER AND ALLODERM;  Surgeon: Glenna Fellows, MD;  Location: [facility];  Service: Plastics;  Laterality: Bilateral;   IR IMAGING GUIDED PORT INSERTION  11/25/2021   IR RADIOLOGIST EVAL & MGMT  11/26/2021   LESION REMOVAL Left 06/02/2019   Procedure: MINOR EXICISION OF LESION,LAYERED CLOSURE 1 CM;  Surgeon: Glenna Fellows, MD;  Location: [facility];  Service: Plastics;  Laterality: Left;   LIPOSUCTION WITH LIPOFILLING Bilateral 06/02/2019   Procedure: LIPOFILLING FROM ABDOMEN TO BILATERAL CHEST;  Surgeon: Glenna Fellows, MD;  Location: [facility];  Service: Plastics;  Laterality: Bilateral;   melanoma surgery Right 2009   upper right arm   NIPPLE SPARING MASTECTOMY WITH SENTINEL LYMPH NODE BIOPSY Bilateral 01/17/2019   Procedure: BILATERAL NIPPLE SPARING MASTECTOMIES WITH LEFT SENTINEL LYMPH NODE MAPPING;  Surgeon: [name], MD;  Location: [facility];  Service: General;  Laterality: Bilateral;   REMOVAL OF BILATERAL TISSUE EXPANDERS WITH PLACEMENT OF BILATERAL BREAST IMPLANTS Bilateral 06/02/2019   Procedure: REMOVAL OF BILATERAL TISSUE EXPANDERS  WITH PLACEMENT OF BILATERAL SILICONE BREAST IMPLANTS;  Surgeon: Glenna Fellows, MD;  Location: [facility];  Service: Plastics;  Laterality: Bilateral;   WISDOM TOOTH EXTRACTION      SOCIAL HISTORY: Social History   Socioeconomic History   Marital status: Married    Spouse name: Not on file   Number of children: Not on file   Years of education: Not on file   Highest education level: Not on file  Occupational History   Not on file  Tobacco Use   Smoking status: Never   Smokeless tobacco: Never  Vaping Use   Vaping status: Never Used   Substance and Sexual Activity   Alcohol use: Yes    Comment: occasional   Drug use: Never   Sexual activity: Yes    Birth control/protection: None  Other Topics Concern   Not on file  Social History Narrative   Not on file   Social Determinants of Health   Financial Resource Strain: Not on file  Food Insecurity: No Food Insecurity (05/25/2022)   Hunger Vital Sign    Worried About Running Out of Food in the Last Year: Never true    Ran Out of Food in the Last Year: Never true  Transportation Needs: No Transportation Needs (05/25/2022)   PRAPARE - Administrator, Civil Service (Medical): No    Lack of Transportation (Non-Medical): No  Physical Activity: Not on file  Stress: Not on file  Social Connections: Not on file  Intimate Partner Violence: Not At Risk (05/25/2022)   Humiliation, Afraid, Rape, and Kick questionnaire    Fear of Current or Ex-Partner: No    Emotionally Abused: No    Physically Abused: No    Sexually Abused: No    FAMILY HISTORY: Family History  Problem Relation Age of Onset   Hypertension Mother    Heart disease Father    Breast cancer Maternal Aunt 64 - 69   Breast cancer Maternal Aunt 40 - 59   Lung cancer Paternal Aunt        diagnosed late 33s   Breast cancer Maternal Grandmother 46 - 69   Breast cancer Paternal Grandmother 63 - 5   Lung cancer Paternal Grandmother        thought to be breast cancer mets   Rectal cancer Paternal Grandmother        diagnosed 35s   Lung cancer Paternal Grandfather        diagnosed 36s    Review of Systems  Constitutional:  Negative for appetite change, chills, fatigue, fever and unexpected weight change.  HENT:   Negative for hearing loss, lump/mass and trouble swallowing.   Eyes:  Negative for eye problems and icterus.  Respiratory:  Negative for chest tightness, cough and shortness of breath.   Cardiovascular:  Negative for chest pain, leg swelling and palpitations.  Gastrointestinal:   Negative for abdominal distention, abdominal pain, constipation, diarrhea, nausea and vomiting.  Endocrine: Negative for hot flashes.  Genitourinary:  Negative for difficulty urinating.   Musculoskeletal:  Negative for arthralgias.  Skin:  Negative for itching and rash.  Neurological:  Negative for dizziness, extremity weakness, headaches and numbness.  Hematological:  Negative for adenopathy. Does not bruise/bleed easily.  Psychiatric/Behavioral:  Negative for depression. The patient is not nervous/anxious.       PHYSICAL EXAMINATION    Vitals:   08/06/22 0837  BP: (!) 109/58  Pulse: (!) 55  Resp: 18  Temp: (!) 97.4 F (36.3  C)  SpO2: 100%    Physical Exam Constitutional:      General: She is not in acute distress.    Appearance: Normal appearance. She is not toxic-appearing.  HENT:     Head: Normocephalic and atraumatic.     Mouth/Throat:     Mouth: Mucous membranes are moist.     Pharynx: Oropharynx is clear. No oropharyngeal exudate or posterior oropharyngeal erythema.  Eyes:     General: No scleral icterus. Cardiovascular:     Rate and Rhythm: Normal rate and regular rhythm.     Pulses: Normal pulses.     Heart sounds: Normal heart sounds.  Pulmonary:     Effort: Pulmonary effort is normal.     Breath sounds: Normal breath sounds.  Abdominal:     General: Abdomen is flat. Bowel sounds are normal. There is no distension.     Palpations: Abdomen is soft.     Tenderness: There is no abdominal tenderness.  Musculoskeletal:        General: No swelling.     Cervical back: Neck supple.  Lymphadenopathy:     Cervical: No cervical adenopathy.  Skin:    General: Skin is warm and dry.     Findings: No rash.  Neurological:     General: No focal deficit present.     Mental Status: She is alert.  Psychiatric:        Mood and Affect: Mood normal.        Behavior: Behavior normal.     LABORATORY DATA:  CBC    Component Value Date/Time   WBC 3.9 (L) 08/06/2022  0814   RBC 4.50 08/06/2022 0814   HGB 12.1 08/06/2022 0814   HGB 10.1 (L) 03/13/2022 1210   HCT 37.4 08/06/2022 0814   PLT 230 08/06/2022 0814   PLT 196 03/13/2022 1210   MCV 83.1 08/06/2022 0814   MCH 26.9 08/06/2022 0814   MCHC 32.4 08/06/2022 0814   RDW 14.0 08/06/2022 0814   LYMPHSABS 0.8 08/06/2022 0814   MONOABS 0.3 08/06/2022 0814   EOSABS 0.1 08/06/2022 0814   BASOSABS 0.0 08/06/2022 0814    CMP     Component Value Date/Time   NA 139 08/06/2022 0814   K 4.0 08/06/2022 0814   CL 106 08/06/2022 0814   CO2 28 08/06/2022 0814   GLUCOSE 86 08/06/2022 0814   BUN 16 08/06/2022 0814   CREATININE 0.82 08/06/2022 0814   CREATININE 0.79 03/13/2022 1210   CALCIUM 9.0 08/06/2022 0814   PROT 6.1 (L) 08/06/2022 0814   ALBUMIN 4.0 08/06/2022 0814   AST 18 08/06/2022 0814   AST 18 03/13/2022 1210   ALT 11 08/06/2022 0814   ALT 12 03/13/2022 1210   ALKPHOS 60 08/06/2022 0814   BILITOT 0.6 08/06/2022 0814   BILITOT 0.4 03/13/2022 1210   GFRNONAA >60 08/06/2022 0814   GFRNONAA >60 03/13/2022 1210   GFRAA >60 06/04/2019 0201      ASSESSMENT and THERAPY PLAN:   Malignant neoplasm of upper-outer quadrant of left breast in female, estrogen receptor positive (HCC) This is a very pleasant 37 year old premenopausal female patient with prior history of left breast DCIS with microinvasive component, negative margins now diagnosed with left breast upper outer quadrant invasive ductal carcinoma, overall grade 2, ER +70% weak to moderate staining, PR 0% negative, HER2 3+ by IHC, Ki-67 of 80% referred to breast oncology for neoadjuvant recommendations.  Since last visit, she had breast MRI which showed tumor size measuring up  to 3.6 cm.  No regional adenopathy.   CT and bone scan with no evidence of metastatic disease. She completed 6 cycles of TCHP neoadjuvantly. She had complete pathologic response. She is now on adjuvant herceptin/perjeta She is now undergoing adjuvant radiation, will  complete 08/10/2022. She will start on Tamoxifen after completion of adjuvant radiation. We have previously discussed the mechanism of action of tamoxifen, adverse effects of tamoxifen including but not limited to postmenopausal symptoms such as hot flashes, vaginal discharge, risk of DVT/PE, endometrial hyperplasia and endometrial malignancy and benefit on bone density.  This medication has been dispensed to the pharmacy of her choice.  With regards to the diarrhea, we have discussed a few options, she can take Imodium once a day versus we can try and omit pertuzumab during her travel plans to make it easier on her.  She is very nervous about omitting pertuzumab.  She is hoping to try Imodium and get it under control.  With regards to the dizziness and vertigo, I wonder if she is getting a bit dehydrated from the ongoing diarrhea which is triggering the vertigo.  I have encouraged hydration and she will continue to monitor the dizziness and keep [place] posted.  She denies any balance or vision issues or falls.  She is agreeable to this plan. Rachel Moulds MD   All questions were answered. The patient knows to call the clinic with any problems, questions or concerns. We can certainly see the patient much sooner if necessary.  Total encounter time:30 minutes*in face-to-face visit time, chart review, lab review, care coordination, order entry, and documentation of the encounter time.   *Total Encounter Time as defined by the Centers for Medicare and Medicaid Services includes, in addition to the face-to-face time of a patient visit (documented in the note above) non-face-to-face time: obtaining and reviewing outside history, ordering and reviewing medications, tests or procedures, care coordination (communications with other health care professionals or caregivers) and documentation in the medical record.

## 2022-08-06 NOTE — Assessment & Plan Note (Addendum)
This is a very pleasant 37 year old premenopausal female patient with prior history of left breast DCIS with microinvasive component, negative margins now diagnosed with left breast upper outer quadrant invasive ductal carcinoma, overall grade 2, ER +70% weak to moderate staining, PR 0% negative, HER2 3+ by IHC, Ki-67 of 80% referred to breast oncology for neoadjuvant recommendations.  Since last visit, she had breast MRI which showed tumor size measuring up to 3.6 cm.  No regional adenopathy.   CT and bone scan with no evidence of metastatic disease. She completed 6 cycles of TCHP neoadjuvantly. She had complete pathologic response. She is now on adjuvant herceptin/perjeta She is now undergoing adjuvant radiation, will complete 08/10/2022. She will start on Tamoxifen after completion of adjuvant radiation. We have previously discussed the mechanism of action of tamoxifen, adverse effects of tamoxifen including but not limited to postmenopausal symptoms such as hot flashes, vaginal discharge, risk of DVT/PE, endometrial hyperplasia and endometrial malignancy and benefit on bone density.  This medication has been dispensed to the pharmacy of her choice.  With regards to the diarrhea, we have discussed a few options, she can take Imodium once a day versus we can try and omit pertuzumab during her travel plans to make it easier on her.  She is very nervous about omitting pertuzumab.  She is hoping to try Imodium and get it under control.  With regards to the dizziness and vertigo, I wonder if she is getting a bit dehydrated from the ongoing diarrhea which is triggering the vertigo.  I have encouraged hydration and she will continue to monitor the dizziness and keep [place] posted.  She denies any balance or vision issues or falls.  She is agreeable to this plan. Rachel Moulds MD

## 2022-08-07 ENCOUNTER — Ambulatory Visit: Payer: BC Managed Care – PPO | Admitting: Hematology and Oncology

## 2022-08-07 ENCOUNTER — Ambulatory Visit
Admission: RE | Admit: 2022-08-07 | Discharge: 2022-08-07 | Disposition: A | Discharge status: Home / Self Care | Payer: BC Managed Care – PPO | Source: Ambulatory Visit | Attending: Radiation Oncology | Admitting: Radiation Oncology

## 2022-08-07 ENCOUNTER — Other Ambulatory Visit: Payer: Self-pay

## 2022-08-07 ENCOUNTER — Ambulatory Visit: Payer: BC Managed Care – PPO

## 2022-08-07 DIAGNOSIS — D0512 Intraductal carcinoma in situ of left breast: Secondary | ICD-10-CM | POA: Diagnosis not present

## 2022-08-07 LAB — RAD ONC ARIA SESSION SUMMARY
Course Elapsed Days: 37
Plan Fractions Treated to Date: 13
Plan Prescribed Dose Per Fraction: 1.8 Gy
Plan Total Fractions Prescribed: 14
Plan Total Prescribed Dose: 25.2 Gy
Reference Point Dosage Given to Date: 23.4 Gy
Reference Point Session Dosage Given: 1.8 Gy
Session Number: 26

## 2022-08-10 ENCOUNTER — Ambulatory Visit: Payer: BC Managed Care – PPO

## 2022-08-10 ENCOUNTER — Other Ambulatory Visit: Payer: Self-pay

## 2022-08-10 ENCOUNTER — Ambulatory Visit
Admission: RE | Admit: 2022-08-10 | Discharge: 2022-08-10 | Disposition: A | Discharge status: Home / Self Care | Payer: BC Managed Care – PPO | Source: Ambulatory Visit | Attending: Radiation Oncology | Admitting: Radiation Oncology

## 2022-08-10 DIAGNOSIS — D0512 Intraductal carcinoma in situ of left breast: Secondary | ICD-10-CM | POA: Diagnosis not present

## 2022-08-10 LAB — RAD ONC ARIA SESSION SUMMARY
Course Elapsed Days: 40
Plan Fractions Treated to Date: 14
Plan Prescribed Dose Per Fraction: 1.8 Gy
Plan Total Fractions Prescribed: 14
Plan Total Prescribed Dose: 25.2 Gy
Reference Point Dosage Given to Date: 25.2 Gy
Reference Point Session Dosage Given: 1.8 Gy
Session Number: 27

## 2022-08-10 NOTE — Telephone Encounter (Signed)
This was completed on 07/20/22 and emailed to her.She is wanting a note from the doctor.

## 2022-08-10 NOTE — Telephone Encounter (Signed)
Ladies, Can you look into this for this patient? Lorayne Marek, RN

## 2022-08-11 ENCOUNTER — Ambulatory Visit: Admission: RE | Admit: 2022-08-11 | Payer: BC Managed Care – PPO | Source: Ambulatory Visit

## 2022-08-11 ENCOUNTER — Other Ambulatory Visit: Payer: Self-pay

## 2022-08-11 DIAGNOSIS — D0512 Intraductal carcinoma in situ of left breast: Secondary | ICD-10-CM | POA: Diagnosis not present

## 2022-08-11 LAB — RAD ONC ARIA SESSION SUMMARY
Course Elapsed Days: 41
Plan Fractions Treated to Date: 14
Plan Prescribed Dose Per Fraction: 1.8 Gy
Plan Total Fractions Prescribed: 14
Plan Total Prescribed Dose: 25.2 Gy
Reference Point Dosage Given to Date: 25.2 Gy
Reference Point Session Dosage Given: 1.8 Gy
Session Number: 28

## 2022-08-12 NOTE — Radiation Completion Notes (Signed)
Patient Name: Stephanie [name], Stephanie [name] MRN: [id number] Date of Birth: [birth year] Referring Physician: Rachel Moulds, M.D. Date of Service: 2022-08-12 Radiation Oncologist: Lonie Peak, M.D. [facility]                             RADIATION ONCOLOGY END OF TREATMENT NOTE     Diagnosis: C50.412 Malignant neoplasm of upper-outer quadrant of left female breast Staging on 2019-01-17: Malignant neoplasm of upper-outer quadrant of female breast (HCC) T=pT16mi, N=pN0, M=cM0 Staging on 2018-10-20: Malignant neoplasm of upper-outer quadrant of female breast (HCC) T=cTis (DCIS), N=cN0, M=cM0 Staging on 2021-11-18: Malignant neoplasm of upper-outer quadrant of left breast in female, estrogen receptor positive (HCC) T=cT1c, N=cN0, M=cM0 Intent: Curative     ==========DELIVERED PLANS==========  First Treatment Date: 2022-07-01 - Last Treatment Date: 2022-08-11   Plan Name: [id number]_[id number] Site: Chest Wall, Left Technique: 3D Mode: Photon Dose Per Fraction: 1.8 Gy Prescribed Dose (Delivered / Prescribed): 25.2 Gy / 25.2 Gy Prescribed Fxs (Delivered / Prescribed): 14 / 14   Plan Name: CW_L_BH Site: Chest Wall, Left Technique: 3D Mode: Photon Dose Per Fraction: 1.8 Gy Prescribed Dose (Delivered / Prescribed): 25.2 Gy / 25.2 Gy Prescribed Fxs (Delivered / Prescribed): 14 / 14     ==========ON TREATMENT VISIT DATES========== 2022-07-06, 2022-07-13, 2022-07-17, 2022-07-27, 2022-08-03, 2022-08-10     ==========UPCOMING VISITS==========       ==========APPENDIX - ON TREATMENT VISIT NOTES==========   See weekly On Treatment Notes in Epic for details.

## 2022-08-13 ENCOUNTER — Encounter: Payer: Self-pay | Admitting: *Deleted

## 2022-08-17 ENCOUNTER — Encounter: Payer: Self-pay | Admitting: Hematology and Oncology

## 2022-08-17 ENCOUNTER — Ambulatory Visit: Payer: BC Managed Care – PPO | Attending: Hematology and Oncology | Admitting: Rehabilitation

## 2022-08-17 ENCOUNTER — Encounter: Payer: Self-pay | Admitting: Rehabilitation

## 2022-08-17 DIAGNOSIS — Z170 Estrogen receptor positive status [ER+]: Secondary | ICD-10-CM | POA: Insufficient documentation

## 2022-08-17 DIAGNOSIS — Z9189 Other specified personal risk factors, not elsewhere classified: Secondary | ICD-10-CM | POA: Insufficient documentation

## 2022-08-17 DIAGNOSIS — Z483 Aftercare following surgery for neoplasm: Secondary | ICD-10-CM | POA: Insufficient documentation

## 2022-08-17 DIAGNOSIS — C50412 Malignant neoplasm of upper-outer quadrant of left female breast: Secondary | ICD-10-CM | POA: Insufficient documentation

## 2022-08-17 DIAGNOSIS — R293 Abnormal posture: Secondary | ICD-10-CM | POA: Diagnosis present

## 2022-08-17 NOTE — Therapy (Signed)
OUTPATIENT PHYSICAL THERAPY BREAST CANCER TREATMENT / Re-eval   Patient Name: Stephanie [name] MRN: [id number] DOB:1985/04/14, 37 y.o., female Today's Date: 08/17/2022  END OF SESSION:  PT End of Session - 08/17/22 1023     Visit Number 1    Number of Visits 7    Date for PT Re-Evaluation 09/28/22    Authorization Type none needed    PT Start Time 0945    PT Stop Time 1020    PT Time Calculation (min) 35 min    Activity Tolerance Patient tolerated treatment well    Behavior During Therapy [facility] for tasks assessed/performed              Past Medical History:  Diagnosis Date   Atypical mole 03/14/2019   mod-left side superior   BRCA gene mutation negative in female    Breast cancer [facility])    Family history of breast cancer    Family history of lung cancer    Family history of rectal cancer    Melanoma (HCC) 10/14/2007   level II- right arm- (EXC)   PONV (postoperative nausea and vomiting)    Past Surgical History:  Procedure Laterality Date   BREAST BIOPSY Left 04/29/2022   [place] LT RADIOACTIVE SEED LOC 04/29/2022 [facility] MAMMOGRAPHY   BREAST LUMPECTOMY WITH RADIOACTIVE SEED AND SENTINEL LYMPH NODE BIOPSY Left 04/30/2022   Procedure: LEFT BREAST LUMPECTOMY WITH RADIOACTIVE SEED AND SENTINEL LYMPH NODE BIOPSY;  Surgeon: [name], MD;  Location: [facility];  Service: General;  Laterality: Left;   BREAST RECONSTRUCTION WITH PLACEMENT OF TISSUE EXPANDER AND ALLODERM Bilateral 01/17/2019   Procedure: BILATERAL BREAST RECONSTRUCTION WITH PLACEMENT OF TISSUE EXPANDER AND ALLODERM;  Surgeon: Glenna Fellows, MD;  Location: [facility];  Service: Plastics;  Laterality: Bilateral;   IR IMAGING GUIDED PORT INSERTION  11/25/2021   IR RADIOLOGIST EVAL & MGMT  11/26/2021   LESION REMOVAL Left 06/02/2019   Procedure: MINOR EXICISION OF LESION,LAYERED CLOSURE 1 CM;  Surgeon: Glenna Fellows, MD;  Location: [facility];  Service: Plastics;   Laterality: Left;   LIPOSUCTION WITH LIPOFILLING Bilateral 06/02/2019   Procedure: LIPOFILLING FROM ABDOMEN TO BILATERAL CHEST;  Surgeon: Glenna Fellows, MD;  Location: [facility];  Service: Plastics;  Laterality: Bilateral;   melanoma surgery Right 2009   upper right arm   NIPPLE SPARING MASTECTOMY WITH SENTINEL LYMPH NODE BIOPSY Bilateral 01/17/2019   Procedure: BILATERAL NIPPLE SPARING MASTECTOMIES WITH LEFT SENTINEL LYMPH NODE MAPPING;  Surgeon: [name], MD;  Location: [facility];  Service: General;  Laterality: Bilateral;   REMOVAL OF BILATERAL TISSUE EXPANDERS WITH PLACEMENT OF BILATERAL BREAST IMPLANTS Bilateral 06/02/2019   Procedure: REMOVAL OF BILATERAL TISSUE EXPANDERS WITH PLACEMENT OF BILATERAL SILICONE BREAST IMPLANTS;  Surgeon: Glenna Fellows, MD;  Location: [facility];  Service: Plastics;  Laterality: Bilateral;   WISDOM TOOTH EXTRACTION     Patient Active Problem List   Diagnosis Date Noted   Port-A-Cath in place 05/14/2022   Malignant neoplasm of upper-outer quadrant of left breast in female, estrogen receptor positive (HCC) 11/18/2021   Family history of breast cancer    Family history of rectal cancer    Family history of lung cancer    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 Al Pimple  REFERRING  DIAG: Left breast cancer  THERAPY DIAG:  Malignant neoplasm of upper-outer quadrant of left breast in female, estrogen receptor positive [facility])  Aftercare following surgery for neoplasm  At risk for lymphedema  Abnormal posture  Rationale for Evaluation and Treatment: Rehabilitation  ONSET DATE: 10/29/21  SUBJECTIVE:                                                                                                                                                                                            SUBJECTIVE STATEMENT: I am done with radiation.  My ROM is not where I want it.  It is tight in the armpit.    PERTINENT HISTORY:  treated for left breast cancer DCIS in 2020 where she underwent bilateral nipple sparing mastectomies with implants and Lt SLNB 4 negative nodes rermoved. No radiation completed. Pt is now done with [facility] and had a left lumpectomy with SLNB on 04/30/22 with 1 negative node removed for a total of 5 removed. Completed radiation.    PATIENT GOALS:  Reassess how my recovery is going related to arm function, pain, and swelling.  PAIN:  Are you having pain? None  PRECAUTIONS: Recent Surgery, left UE Lymphedema risk  ACTIVITY LEVEL / LEISURE: back to work and activity - anything reaching up is hard but I can do it.  I am doing weight classes anything 10-15#.     OBJECTIVE:   PATIENT SURVEYS:  QUICK DASH: 15% from 9%   OBSERVATIONS: Incisions well healed.  Lateral breast incision is the most recent.  Radiation field is red. No skin openings.   POSTURE:  Slightly guarded  LYMPHEDEMA ASSESSMENT:   UPPER EXTREMITY AROM/PROM:   A/PROM RIGHT   eval    Shoulder extension 60  Shoulder flexion 160  Shoulder abduction 165  Shoulder internal rotation    Shoulder external rotation 95                          (Blank rows = not tested)   A/PROM LEFT   eval 05/22/22 08/17/22  Shoulder extension 52 55 50  Shoulder flexion 163 153 - pull in axilla  145 - pec tightness  Shoulder abduction 165 155 - pull in axilla  155 - pec   Shoulder internal rotation      Shoulder external rotation 90 90 90                          (Blank rows = not tested)   MMT UEs:  5/5 bil no pain  CERVICAL AROM: All within normal  limits:    LYMPHEDEMA ASSESSMENTS:    LANDMARK RIGHT   eval  10 cm proximal to olecranon process 31.5  Olecranon process 25.5  10 cm proximal to ulnar styloid process 21.4  Just proximal to ulnar styloid  process 15.1  Across hand at thumb web space 18.7  At base of 2nd digit 6.0  (Blank rows = not tested)   LANDMARK LEFT   eval  10 cm proximal to olecranon process 31.6  Olecranon process 26.5  10 cm proximal to ulnar styloid process 21.8  Just proximal to ulnar styloid process 15.5  Across hand at thumb web space 18.0  At base of 2nd digit 6.0  (Blank rows = not tested)  TODAY"S TREATMENT 08/17/22 Re-eval performed Reviewed stretches per HEP section with performance of doorway for review, supine flexion, and chest stretch as pt has had these before Supine STM pectoralis with cocoa butter outside of radiation field as it is healing with arm propped overhead and in neutral.    PATIENT EDUCATION:  Education details: post op information Person educated: Patient Education method: Explanation, Demonstration, Tactile cues, Verbal cues, and Handouts Education comprehension: verbalized understanding and returned demonstration  HOME EXERCISE PROGRAM: Medbridge code lost but gave pt supine dowel flexion, chest stretch, wall walking flex and abd, and doorway stretch.   ASSESSMENT:  CLINICAL IMPRESSION:  Pt returns 1 week after radiation with continued shoulder stiffness and decreased ROM from post-op check 3 months ago.  The radiation field is still red and inflammed but pt should tolerated MT in another week or so.  Will re-start PT visits with focus on end range motion.   Pt will benefit from skilled therapeutic intervention to improve on the following deficits: Decreased knowledge of precautions, impaired UE functional use, pain, decreased ROM, postural dysfunction.   PT treatment/interventions: ADL/Self care home management, Therapeutic exercises, Patient/Family education, Self Care, DME instructions, Manual therapy, and Re-evaluation   GOALS: Goals reviewed with patient? Yes  LONG TERM GOALS:  (STG=LTG)  GOALS Name Target Date  Goal status  1 Pt will demonstrate she has regained  full shoulder ROM and function post operatively compared to baselines.  Baseline: 09/28/22 NEW  2 Pt will return to gym classes without limitations in overhead lifting 09/28/22 NEW  3 Pt will be ind with final HEP 09/28/22 NEW          PLAN:  PT FREQUENCY/DURATION: 1x- 2x per week x 4 weeks (6 weeks added to POC for timing)  PLAN FOR NEXT SESSION: Lt PROM, AAROM, STM to pectoralis, foam roll    ,  R, PT 08/17/2022, 10:25 AM

## 2022-08-24 ENCOUNTER — Telehealth: Payer: Self-pay

## 2022-08-24 NOTE — Telephone Encounter (Signed)
Left voicemail informing patient that her [employer] disability documents had been completed and faxed to company. Fax conformation received and a copy of documents placed with registration for pickup at next appointment.

## 2022-08-26 ENCOUNTER — Ambulatory Visit: Payer: BC Managed Care – PPO

## 2022-08-26 DIAGNOSIS — C50412 Malignant neoplasm of upper-outer quadrant of left female breast: Secondary | ICD-10-CM

## 2022-08-26 DIAGNOSIS — Z9189 Other specified personal risk factors, not elsewhere classified: Secondary | ICD-10-CM

## 2022-08-26 DIAGNOSIS — Z483 Aftercare following surgery for neoplasm: Secondary | ICD-10-CM

## 2022-08-26 DIAGNOSIS — R293 Abnormal posture: Secondary | ICD-10-CM

## 2022-08-26 NOTE — Therapy (Signed)
OUTPATIENT PHYSICAL THERAPY BREAST CANCER TREATMENT / Re-eval   Patient Name: Stephanie [name] MRN: [id number] DOB:Mar 20, 1985, 37 y.o., female Today's Date: 08/26/2022  END OF SESSION:  PT End of Session - 08/26/22 1356     Visit Number 2    Number of Visits 7    Date for PT Re-Evaluation 09/28/22    PT Start Time 0203    PT Stop Time 0256    PT Time Calculation (min) 53 min    Activity Tolerance Patient tolerated treatment well    Behavior During Therapy [facility] for tasks assessed/performed              Past Medical History:  Diagnosis Date   Atypical mole 03/14/2019   mod-left side superior   BRCA gene mutation negative in female    Breast cancer [facility])    Family history of breast cancer    Family history of lung cancer    Family history of rectal cancer    Melanoma (HCC) 10/14/2007   level II- right arm- (EXC)   PONV (postoperative nausea and vomiting)    Past Surgical History:  Procedure Laterality Date   BREAST BIOPSY Left 04/29/2022   [place] LT RADIOACTIVE SEED LOC 04/29/2022 [facility]AMMOGRAPHY   BREAST LUMPECTOMY WITH RADIOACTIVE SEED AND SENTINEL LYMPH NODE BIOPSY Left 04/30/2022   Procedure: LEFT BREAST LUMPECTOMY WITH RADIOACTIVE SEED AND SENTINEL LYMPH NODE BIOPSY;  Surgeon: [name], MD;  Location: [facility];  Service: General;  Laterality: Left;   BREAST RECONSTRUCTION WITH PLACEMENT OF TISSUE EXPANDER AND ALLODERM Bilateral 01/17/2019   Procedure: BILATERAL BREAST RECONSTRUCTION WITH PLACEMENT OF TISSUE EXPANDER AND ALLODERM;  Surgeon: Glenna Fellows, MD;  Location: [facility];  Service: Plastics;  Laterality: Bilateral;   IR IMAGING GUIDED PORT INSERTION  11/25/2021   IR RADIOLOGIST EVAL & MGMT  11/26/2021   LESION REMOVAL Left 06/02/2019   Procedure: MINOR EXICISION OF LESION,LAYERED CLOSURE 1 CM;  Surgeon: Glenna Fellows, MD;  Location: [facility];  Service: Plastics;  Laterality: Left;   LIPOSUCTION WITH  LIPOFILLING Bilateral 06/02/2019   Procedure: LIPOFILLING FROM ABDOMEN TO BILATERAL CHEST;  Surgeon: Glenna Fellows, MD;  Location: [facility];  Service: Plastics;  Laterality: Bilateral;   melanoma surgery Right 2009   upper right arm   NIPPLE SPARING MASTECTOMY WITH SENTINEL LYMPH NODE BIOPSY Bilateral 01/17/2019   Procedure: BILATERAL NIPPLE SPARING MASTECTOMIES WITH LEFT SENTINEL LYMPH NODE MAPPING;  Surgeon: [name], MD;  Location: [facility];  Service: General;  Laterality: Bilateral;   REMOVAL OF BILATERAL TISSUE EXPANDERS WITH PLACEMENT OF BILATERAL BREAST IMPLANTS Bilateral 06/02/2019   Procedure: REMOVAL OF BILATERAL TISSUE EXPANDERS WITH PLACEMENT OF BILATERAL SILICONE BREAST IMPLANTS;  Surgeon: Glenna Fellows, MD;  Location:  [facility];  Service: Plastics;  Laterality: Bilateral;   WISDOM TOOTH EXTRACTION     Patient Active Problem List   Diagnosis Date Noted   Port-A-Cath in place 05/14/2022   Malignant neoplasm of upper-outer quadrant of left breast in female, estrogen receptor positive (HCC) 11/18/2021   Family history of breast cancer    Family history of rectal cancer    Family history of lung cancer    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 Al Pimple  REFERRING DIAG: Left breast cancer  THERAPY DIAG:  Malignant neoplasm of upper-outer quadrant of left breast in female, estrogen receptor positive [facility])  Aftercare following surgery for neoplasm  At risk for lymphedema  Abnormal posture  Rationale for Evaluation and Treatment: Rehabilitation  ONSET DATE: 10/29/21  SUBJECTIVE:                                                                                                                                                                                            SUBJECTIVE STATEMENT: ROM seems to be doing better. Skin is doing better    PERTINENT HISTORY:  treated for left breast cancer DCIS in 2020 where she underwent bilateral nipple sparing mastectomies with implants and Lt SLNB 4 negative nodes rermoved. No radiation completed. Pt is now done with [facility] and had a left lumpectomy with SLNB on 04/30/22 with 1 negative node removed for a total of 5 removed. Completed radiation.    PATIENT GOALS:  Reassess how my recovery is going related to arm function, pain, and swelling.  PAIN:  Are you having pain? None  PRECAUTIONS: Recent Surgery, left UE Lymphedema risk  ACTIVITY LEVEL / LEISURE: back to work and activity - anything reaching up is hard but I can do it.  I am doing weight classes anything 10-15#.     OBJECTIVE:   PATIENT SURVEYS:  QUICK DASH: 15% from 9%   OBSERVATIONS: Incisions well healed.  Lateral breast incision is the most recent.  Radiation field is red. No skin openings.   POSTURE:  Slightly guarded  LYMPHEDEMA ASSESSMENT:   UPPER EXTREMITY AROM/PROM:   A/PROM RIGHT   eval    Shoulder extension 60  Shoulder flexion 160  Shoulder abduction 165  Shoulder internal rotation    Shoulder external rotation 95                          (Blank rows = not tested)   A/PROM LEFT   eval 05/22/22 08/17/22  Shoulder extension 52 55 50  Shoulder flexion 163 153 - pull in axilla  145 - pec tightness  Shoulder abduction 165 155 - pull in axilla  155 - pec   Shoulder internal rotation      Shoulder external rotation 90 90 90                          (Blank rows = not tested)   MMT UEs:  5/5 bil no pain  CERVICAL AROM: All within normal limits:    LYMPHEDEMA ASSESSMENTS:    LANDMARK RIGHT   eval  10 cm proximal to  olecranon process 31.5  Olecranon process 25.5  10 cm proximal to ulnar styloid process 21.4  Just proximal to ulnar styloid process 15.1  Across hand at thumb web space 18.7  At base of 2nd digit  6.0  (Blank rows = not tested)   LANDMARK LEFT   eval  10 cm proximal to olecranon process 31.6  Olecranon process 26.5  10 cm proximal to ulnar styloid process 21.8  Just proximal to ulnar styloid process 15.5  Across hand at thumb web space 18.0  At base of 2nd digit 6.0  (Blank rows = not tested)  TODAY"S TREATMENT 08/26/2022 Overhead pulleys flexion and abduction x 2 min Soft tissue mobilization to left pectorals, UT, lateral trunk and in SL to left scapular area PROM left shoulder flexion, scaption, horizontal abduction  Supine bilateral AROM on foam roll;flexion/scaption, horizontal abd, snow angels all x 5   08/17/22 Re-eval performed Reviewed stretches per HEP section with performance of doorway for review, supine flexion, and chest stretch as pt has had these before Supine STM pectoralis with cocoa butter outside of radiation field as it is healing with arm propped overhead and in neutral.    PATIENT EDUCATION:  Education details: post op information Person educated: Patient Education method: Explanation, Demonstration, Tactile cues, Verbal cues, and Handouts Education comprehension: verbalized understanding and returned demonstration  HOME EXERCISE PROGRAM: Medbridge code lost but gave pt supine dowel flexion, chest stretch, wall walking flex and abd, and doorway stretch.   ASSESSMENT:  CLINICAL IMPRESSION: Very good visual improvement in left shoulder ROM at completion of rx. Pt did very well on half foam roll and felt good after treatment.  Pt will benefit from skilled therapeutic intervention to improve on the following deficits: Decreased knowledge of precautions, impaired UE functional use, pain, decreased ROM, postural dysfunction.   PT treatment/interventions: ADL/Self care home management, Therapeutic exercises, Patient/Family education, Self Care, DME instructions, Manual therapy, and Re-evaluation   GOALS: Goals reviewed with patient? Yes  LONG TERM  GOALS:  (STG=LTG)  GOALS Name Target Date  Goal status  1 Pt will demonstrate she has regained full shoulder ROM and function post operatively compared to baselines.  Baseline: 09/28/22 NEW  2 Pt will return to gym classes without limitations in overhead lifting 09/28/22 NEW  3 Pt will be ind with final HEP 09/28/22 NEW          PLAN:  PT FREQUENCY/DURATION: 1x- 2x per week x 4 weeks (6 weeks added to POC for timing)  PLAN FOR NEXT SESSION: Lt PROM, AAROM, STM to pectoralis, foam roll    Waynette Buttery, PT 08/26/2022, 3:00 PM

## 2022-08-27 ENCOUNTER — Inpatient Hospital Stay: Payer: BC Managed Care – PPO | Admitting: Adult Health

## 2022-08-27 ENCOUNTER — Inpatient Hospital Stay: Payer: BC Managed Care – PPO

## 2022-08-27 ENCOUNTER — Other Ambulatory Visit: Payer: Self-pay

## 2022-08-27 ENCOUNTER — Encounter: Payer: Self-pay | Admitting: Adult Health

## 2022-08-27 ENCOUNTER — Inpatient Hospital Stay: Payer: BC Managed Care – PPO | Attending: Hematology and Oncology

## 2022-08-27 VITALS — BP 105/57 | HR 60 | Temp 97.5°F | Resp 18 | Ht 66.0 in | Wt 155.5 lb

## 2022-08-27 VITALS — BP 109/66 | HR 53 | Resp 18

## 2022-08-27 DIAGNOSIS — Z5111 Encounter for antineoplastic chemotherapy: Secondary | ICD-10-CM | POA: Diagnosis present

## 2022-08-27 DIAGNOSIS — Z170 Estrogen receptor positive status [ER+]: Secondary | ICD-10-CM

## 2022-08-27 DIAGNOSIS — Z95828 Presence of other vascular implants and grafts: Secondary | ICD-10-CM

## 2022-08-27 DIAGNOSIS — C50412 Malignant neoplasm of upper-outer quadrant of left female breast: Secondary | ICD-10-CM | POA: Insufficient documentation

## 2022-08-27 LAB — CBC WITH DIFFERENTIAL/PLATELET
Abs Immature Granulocytes: 0.01 10*3/uL (ref 0.00–0.07)
Basophils Absolute: 0 10*3/uL (ref 0.0–0.1)
Basophils Relative: 1 %
Eosinophils Absolute: 0.2 10*3/uL (ref 0.0–0.5)
Eosinophils Relative: 3 %
HCT: 39.1 % (ref 36.0–46.0)
Hemoglobin: 12.7 g/dL (ref 12.0–15.0)
Immature Granulocytes: 0 %
Lymphocytes Relative: 15 %
Lymphs Abs: 0.8 10*3/uL (ref 0.7–4.0)
MCH: 26.6 pg (ref 26.0–34.0)
MCHC: 32.5 g/dL (ref 30.0–36.0)
MCV: 82 fL (ref 80.0–100.0)
Monocytes Absolute: 0.3 10*3/uL (ref 0.1–1.0)
Monocytes Relative: 6 %
Neutro Abs: 4.1 10*3/uL (ref 1.7–7.7)
Neutrophils Relative %: 75 %
Platelets: 223 10*3/uL (ref 150–400)
RBC: 4.77 MIL/uL (ref 3.87–5.11)
RDW: 14.5 % (ref 11.5–15.5)
WBC: 5.4 10*3/uL (ref 4.0–10.5)
nRBC: 0 % (ref 0.0–0.2)

## 2022-08-27 LAB — COMPREHENSIVE METABOLIC PANEL
ALT: 9 U/L (ref 0–44)
AST: 15 U/L (ref 15–41)
Albumin: 4.1 g/dL (ref 3.5–5.0)
Alkaline Phosphatase: 70 U/L (ref 38–126)
Anion gap: 5 (ref 5–15)
BUN: 18 mg/dL (ref 6–20)
CO2: 27 mmol/L (ref 22–32)
Calcium: 8.4 mg/dL — ABNORMAL LOW (ref 8.9–10.3)
Chloride: 107 mmol/L (ref 98–111)
Creatinine, Ser: 0.91 mg/dL (ref 0.44–1.00)
GFR, Estimated: >60 mL/min (ref >60)
Glucose, Bld: 96 mg/dL (ref 70–99)
Potassium: 4.2 mmol/L (ref 3.5–5.1)
Sodium: 139 mmol/L (ref 135–145)
Total Bilirubin: 0.5 mg/dL (ref 0.3–1.2)
Total Protein: 6.7 g/dL (ref 6.5–8.1)

## 2022-08-27 MED ORDER — DIPHENHYDRAMINE HCL 25 MG PO CAPS
50.0000 mg | ORAL_CAPSULE | Freq: Once | ORAL | Status: AC
  Administered 2022-08-27: 50 mg via ORAL
  Filled 2022-08-27: qty 2

## 2022-08-27 MED ORDER — SODIUM CHLORIDE 0.9 % IV SOLN: Freq: Once | INTRAVENOUS | Status: AC

## 2022-08-27 MED ORDER — HEPARIN SOD (PORK) LOCK FLUSH 100 UNIT/ML IV SOLN
500.0000 [IU] | Freq: Once | INTRAVENOUS | Status: AC | PRN
  Administered 2022-08-27: 500 [IU]

## 2022-08-27 MED ORDER — ACETAMINOPHEN 325 MG PO TABS
650.0000 mg | ORAL_TABLET | Freq: Once | ORAL | Status: AC
  Administered 2022-08-27: 650 mg via ORAL
  Filled 2022-08-27: qty 2

## 2022-08-27 MED ORDER — TRASTUZUMAB-ANNS CHEMO 150 MG IV SOLR
6.0000 mg/kg | Freq: Once | INTRAVENOUS | Status: AC
  Administered 2022-08-27: 420 mg via INTRAVENOUS
  Filled 2022-08-27: qty 20

## 2022-08-27 MED ORDER — SODIUM CHLORIDE 0.9% FLUSH
10.0000 mL | INTRAVENOUS | Status: DC | PRN
  Administered 2022-08-27: 10 mL

## 2022-08-27 MED ORDER — SODIUM CHLORIDE 0.9% FLUSH
10.0000 mL | Freq: Once | INTRAVENOUS | Status: AC
  Administered 2022-08-27: 10 mL

## 2022-08-27 MED ORDER — SODIUM CHLORIDE 0.9 % IV SOLN
420.0000 mg | Freq: Once | INTRAVENOUS | Status: AC
  Administered 2022-08-27: 420 mg via INTRAVENOUS
  Filled 2022-08-27: qty 14

## 2022-08-27 NOTE — Patient Instructions (Signed)
[facility]  Discharge Instructions: Thank you for choosing [facility] to provide your oncology and hematology care.   If you have a lab appointment with the Cancer Center, please go directly to the Cancer Center and check in at the registration area.   Wear comfortable clothing and clothing appropriate for easy access to any Portacath or PICC line.   We strive to give you quality time with your provider. You may need to reschedule your appointment if you arrive late (15 or more minutes).  Arriving late affects you and other patients whose appointments are after yours.  Also, if you miss three or more appointments without notifying the office, you may be dismissed from the clinic at the provider's discretion.      For prescription refill requests, have your pharmacy contact our office and allow 72 hours for refills to be completed.    Today you received the following chemotherapy and/or immunotherapy agents: Trastuzumab, Pertuzumab.      To help prevent nausea and vomiting after your treatment, we encourage you to take your nausea medication as directed.  BELOW ARE SYMPTOMS THAT SHOULD BE REPORTED IMMEDIATELY: *FEVER GREATER THAN 100.4 F (38 C) OR HIGHER *CHILLS OR SWEATING *NAUSEA AND VOMITING THAT IS NOT CONTROLLED WITH YOUR NAUSEA MEDICATION *UNUSUAL SHORTNESS OF BREATH *UNUSUAL BRUISING OR BLEEDING *URINARY PROBLEMS (pain or burning when urinating, or frequent urination) *BOWEL PROBLEMS (unusual diarrhea, constipation, pain near the anus) TENDERNESS IN MOUTH AND THROAT WITH OR WITHOUT PRESENCE OF ULCERS (sore throat, sores in mouth, or a toothache) UNUSUAL RASH, SWELLING OR PAIN  UNUSUAL VAGINAL DISCHARGE OR ITCHING   Items with * indicate a potential emergency and should be followed up as soon as possible or go to the Emergency Department if any problems should occur.  Please show the CHEMOTHERAPY ALERT CARD or IMMUNOTHERAPY  ALERT CARD at check-in to the Emergency Department and triage nurse.  Should you have questions after your visit or need to cancel or reschedule your appointment, please contact [facility]  Dept: [phone]  and follow the prompts.  Office hours are 8:00 a.m. to 4:30 p.m. Monday - Friday. Please note that voicemails left after 4:00 p.m. may not be returned until the following business day.  We are closed weekends and major holidays. You have access to a nurse at all times for urgent questions. Please call the main number to the clinic Dept: [phone] and follow the prompts.   For any non-urgent questions, you may also contact your provider using MyChart. We now offer e-Visits for anyone 18 and older to request care online for non-urgent symptoms. For details visit [email or web address].   Also download the MyChart app! Go to the app store, search "MyChart", open the app, select [facility], and log in with your MyChart username and password.  

## 2022-08-27 NOTE — Assessment & Plan Note (Signed)
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  She is status post neoadjuvant chemotherapy, lumpectomy, adjuvant radiation, and continues on maintenance Herceptin/Perjeta.    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  I placed orders for an echocardiogram to be completed this month. Diarrhea: I recommended continue hydration and Imodium as needed. We discussed her next steps which include taking tamoxifen which she will start in the next few days.  I also gave her information and reviewed Signatera testing with her.  [name] will return in 3 weeks for labs, follow-up, and her next infusion.  She knows to let us know if she has any questions or concerns that may arise between now and her next visit with [place].

## 2022-08-27 NOTE — Progress Notes (Signed)
Stephanie [name] presents today for follow-up after completing radiation to her left breast on 08-11-22.   Pain:pt denies at this time Skin: reports good skin healing, will start using vitamin e oil for two months Fatigue: remains though this is improving  ROM: improving, going to physical therapy, which his helping with the tightness Lymphedema: none to report MedOnc F/U: Dr. Al Pimple on 09-04-22  Other issues of note: Pt has no major questions or concerns at this time. Pt reports she is doing well overall.   Pt reports Yes No Comments  Tamoxifen [x]  []  20mg   Letrozole []  [x]    Anastrazole []  [x]    Mammogram [x]  Date:  []  Encouraged yearly mammograms for pt.

## 2022-08-27 NOTE — Progress Notes (Signed)
[facility] Cancer Follow up:    Default, Provider, MD    DIAGNOSIS:  Cancer Staging  Malignant neoplasm of upper-outer quadrant of left breast in female, estrogen receptor positive (HCC) Staging form: Breast, AJCC 8th Edition - Clinical: Stage IA (cT1c, cN0, cM0, G2, ER+, PR-, HER2+) - Signed by Rachel Moulds, MD on 11/18/2021 Histologic grading system: 3 grade system   SUMMARY OF ONCOLOGIC HISTORY: Oncology History  Malignant neoplasm of upper-outer quadrant of female breast (HCC) (Resolved)  10/20/2018 Initial Diagnosis   Malignant neoplasm of upper-outer quadrant of left breast in female, estrogen receptor positive (HCC)   10/20/2018 Cancer Staging   Staging form: Breast, AJCC 8th Edition - Clinical stage from 10/20/2018: Stage 0 (cTis (DCIS), cN0, cM0, ER+, PR-) - Signed by Lowella Dell, MD on 02/13/2020 Stage prefix: Initial diagnosis   11/03/2018 Genetic Testing   Negative genetic testing:  No pathogenic variants detected on the [facility] Multi-Cancer panel, ordered by Dr. Juliene Pina at [facility]. The report date is 11/03/2018.  The Multi-Cancer Panel offered by [facility] includes sequencing and/or deletion duplication testing of the following 84 genes: AIP, ALK, APC, ATM, AXIN2,BAP1,  BARD1, BLM, BMPR1A, BRCA1, BRCA2, BRIP1, CASR, CDC73, CDH1, CDK4, CDKN1B, CDKN1C, CDKN2A (p14ARF), CDKN2A (p16INK4a), CEBPA, CHEK2, CTNNA1, DICER1, DIS3L2, EGFR (c.2369C>T, p.Thr790Met variant only), EPCAM (Deletion/duplication testing only), FH, FLCN, GATA2, GPC3, GREM1 (Promoter region deletion/duplication testing only), HOXB13 (c.251G>A, p.Gly84Glu), HRAS, KIT, MAX, MEN1, MET, MITF (c.952G>A, p.Glu318Lys variant only), MLH1, MSH2, MSH3, MSH6, MUTYH, NBN, NF1, NF2, NTHL1, PALB2, PDGFRA, PHOX2B, PMS2, POLD1, POLE, POT1, PRKAR1A, PTCH1, PTEN, RAD50, RAD51C, RAD51D, RB1, RECQL4, RET, RUNX1, SDHAF2, SDHA (sequence changes only), SDHB, SDHC, SDHD, SMAD4, SMARCA4, SMARCB1,  SMARCE1, STK11, SUFU, TERC, TERT, TMEM127, TP53, TSC1, TSC2, VHL, WRN and WT1.     01/17/2019 Cancer Staging   Staging form: Breast, AJCC 8th Edition - Pathologic stage from 01/17/2019: Stage IA (pT26mi, pN0, cM0, G2, ER+, PR-, HER2-) - Signed by Loa Socks, NP on 02/01/2019   Malignant neoplasm of upper-outer quadrant of left breast in female, estrogen receptor positive (HCC)  10/21/2021 Breast US   Breast ultrasound of the palpable mass showed hypoechoic oval vascular mass with indistinct margins measuring 1.5 x 1 cm x 1.4 cm.  No abnormal lymph nodes found in the left axilla.  She had ultrasound-guided biopsy.  MRI scheduled for tomorrow.   10/29/2021 Pathology Results   Surgical pathology from the left breast needle core biopsy at 130 o'clock showed invasive ductal carcinoma, overall grade 2, prognostics ER 70% positive weak to moderate staining, PR 0% negative, Ki-67 of 80% and HER2 3+   11/18/2021 Cancer Staging   Staging form: Breast, AJCC 8th Edition - Clinical: Stage IA (cT1c, cN0, cM0, G2, ER+, PR-, HER2+) - Signed by Rachel Moulds, MD on 11/18/2021 Histologic grading system: 3 grade system   11/26/2021 - 03/16/2022 Chemotherapy   Patient is on Treatment Plan : BREAST  Docetaxel + Carboplatin + Trastuzumab + Pertuzumab  (TCHP) q21d      04/03/2022 -  Chemotherapy   Patient is on Treatment Plan : BREAST Trastuzumab + Pertuzumab q21d x 11 cycles     04/30/2022 Surgery   Left breast lumpectomy: no residual cancer identified, margins negative, treatment effect present, 1SLN negative for cancer.   06/24/2022 - 08/03/2022 Radiation Therapy   Adjuvant radiation therapy     CURRENT THERAPY: Herceptin/Perjeta; Tamoxifen  INTERVAL HISTORY: Stephanie [name] 37 y.o. female returns for follow-up prior to receiving every  3-week Herceptin Perjeta.  She is tolerating this well.  Her most recent echocardiogram occurred on Jun 10, 2022 demonstrating a left ventricular ejection fraction of  65%.  Her main issue with the Herceptin Perjeta is diarrhea.  She is managing this with Imodium and feels like it is under control with these measures.  She denies any blood or mucus in her stool.  And she denies any lightheadedness or feelings of dehydration.  She was prescribed tamoxifen daily by Dr. Orland Dec and is due to start this in the next week.   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  MEDICAL HISTORY: Past Medical History:  Diagnosis Date   Atypical mole 03/14/2019   mod-left side superior   BRCA gene mutation negative in female    Breast cancer [facility])    Family history of breast cancer    Family history of lung cancer    Family history of rectal cancer    Melanoma (HCC) 10/14/2007   level II- right arm- (EXC)   PONV (postoperative nausea and vomiting)     SURGICAL HISTORY: Past Surgical History:  Procedure Laterality Date   BREAST BIOPSY Left 04/29/2022   [place] LT RADIOACTIVE SEED LOC 04/29/2022 [facility] MAMMOGRAPHY   BREAST LUMPECTOMY WITH RADIOACTIVE SEED AND SENTINEL LYMPH NODE BIOPSY Left 04/30/2022   Procedure: LEFT BREAST LUMPECTOMY WITH RADIOACTIVE SEED AND SENTINEL LYMPH NODE BIOPSY;  Surgeon: [name], MD;  Location: [facility];  Service: General;  Laterality: Left;   BREAST RECONSTRUCTION WITH PLACEMENT OF TISSUE EXPANDER AND ALLODERM Bilateral 01/17/2019   Procedure: BILATERAL BREAST RECONSTRUCTION WITH PLACEMENT OF TISSUE EXPANDER AND ALLODERM;  Surgeon: Glenna Fellows,  MD;  Location: [facility];  Service: Plastics;  Laterality: Bilateral;   IR IMAGING GUIDED PORT INSERTION  11/25/2021   IR RADIOLOGIST EVAL & MGMT  11/26/2021   LESION REMOVAL Left 06/02/2019   Procedure: MINOR EXICISION OF LESION,LAYERED CLOSURE 1 CM;  Surgeon: Glenna Fellows, MD;  Location: [facility];  Service: Plastics;  Laterality: Left;   LIPOSUCTION WITH LIPOFILLING Bilateral 06/02/2019   Procedure: LIPOFILLING FROM ABDOMEN TO BILATERAL CHEST;  Surgeon: Glenna Fellows, MD;  Location: [facility];  Service: Plastics;  Laterality: Bilateral;   melanoma surgery Right 2009   upper right arm   NIPPLE SPARING MASTECTOMY WITH SENTINEL LYMPH NODE BIOPSY Bilateral 01/17/2019   Procedure: BILATERAL NIPPLE SPARING MASTECTOMIES WITH LEFT SENTINEL LYMPH NODE MAPPING;  Surgeon: [name], MD;  Location:  SURGERY CENTER;  Service: General;  Laterality: Bilateral;   REMOVAL OF BILATERAL TISSUE EXPANDERS WITH PLACEMENT OF BILATERAL BREAST IMPLANTS Bilateral 06/02/2019   Procedure: REMOVAL OF BILATERAL TISSUE EXPANDERS WITH PLACEMENT OF BILATERAL SILICONE BREAST IMPLANTS;  Surgeon: Glenna Fellows, MD;  Location:  SURGERY CENTER;  Service: Plastics;  Laterality: Bilateral;   WISDOM TOOTH EXTRACTION      SOCIAL HISTORY: Social History   Socioeconomic History   Marital status: Married    Spouse name:  Not on file   Number of children: Not on file   Years of education: Not on file   Highest education level: Not on file  Occupational History   Not on file  Tobacco Use   Smoking status: Never   Smokeless tobacco: Never  Vaping Use   Vaping status: Never Used  Substance and Sexual Activity   Alcohol use: Yes    Comment: occasional   Drug use: Never   Sexual activity: Yes    Birth control/protection: None  Other Topics Concern   Not on file  Social History Narrative   Not on file   Social Determinants of Health    Financial Resource Strain: Not on file  Food Insecurity: No Food Insecurity (05/25/2022)   Hunger Vital Sign    Worried About Running Out of Food in the Last Year: Never true    Ran Out of Food in the Last Year: Never true  Transportation Needs: No Transportation Needs (05/25/2022)   PRAPARE - Administrator, Civil Service (Medical): No    Lack of Transportation (Non-Medical): No  Physical Activity: Not on file  Stress: Not on file  Social Connections: Not on file  Intimate Partner Violence: Not At Risk (05/25/2022)   Humiliation, Afraid, Rape, and Kick questionnaire    Fear of Current or Ex-Partner: No    Emotionally Abused: No    Physically Abused: No    Sexually Abused: No    FAMILY HISTORY: Family History  Problem Relation Age of Onset   Hypertension Mother    Heart disease Father    Breast cancer Maternal Aunt 36 - 69   Breast cancer Maternal Aunt 30 - 59   Lung cancer Paternal Aunt        diagnosed late 17s   Breast cancer Maternal Grandmother 9 - 69   Breast cancer Paternal Grandmother 30 - 73   Lung cancer Paternal Grandmother        thought to be breast cancer mets   Rectal cancer Paternal Grandmother        diagnosed 65s   Lung cancer Paternal Grandfather        diagnosed 72s    Review of Systems  Constitutional:  Negative for appetite change, chills, fatigue, fever and unexpected weight change.  HENT:   Negative for hearing loss, lump/mass and trouble swallowing.   Eyes:  Negative for eye problems and icterus.  Respiratory:  Negative for chest tightness, cough and shortness of breath.   Cardiovascular:  Negative for chest pain, leg swelling and palpitations.  Gastrointestinal:  Positive for diarrhea. Negative for abdominal distention, abdominal pain, blood in stool, constipation, nausea and vomiting.  Endocrine: Negative for hot flashes.  Genitourinary:  Negative for difficulty urinating.   Musculoskeletal:  Negative for arthralgias.  Skin:   Negative for itching and rash.  Neurological:  Negative for dizziness, extremity weakness, headaches and numbness.  Hematological:  Negative for adenopathy. Does not bruise/bleed easily.  Psychiatric/Behavioral:  Negative for depression. The patient is not nervous/anxious.       PHYSICAL EXAMINATION   Onc Performance Status - 08/27/22 0947       ECOG Perf Status   ECOG Perf Status Fully active, able to carry on all pre-disease performance without restriction      KPS SCALE   KPS % SCORE Normal, no compliants, no evidence of disease             Vitals:   08/27/22 0944  BP: Marland Kitchen)  105/57  Pulse: 60  Resp: 18  Temp: (!) 97.5 F (36.4 C)  SpO2: 100%    Physical Exam Constitutional:      General: She is not in acute distress.    Appearance: Normal appearance. She is not toxic-appearing.  HENT:     Head: Normocephalic and atraumatic.     Mouth/Throat:     Mouth: Mucous membranes are moist.     Pharynx: Oropharynx is clear. No oropharyngeal exudate or posterior oropharyngeal erythema.  Eyes:     General: No scleral icterus. Cardiovascular:     Rate and Rhythm: Normal rate and regular rhythm.     Pulses: Normal pulses.     Heart sounds: Normal heart sounds.  Pulmonary:     Effort: Pulmonary effort is normal.     Breath sounds: Normal breath sounds.  Abdominal:     General: Abdomen is flat. Bowel sounds are normal. There is no distension.     Palpations: Abdomen is soft.     Tenderness: There is no abdominal tenderness.  Musculoskeletal:        General: No swelling.     Cervical back: Neck supple.  Lymphadenopathy:     Cervical: No cervical adenopathy.  Skin:    General: Skin is warm and dry.     Findings: No rash.  Neurological:     General: No focal deficit present.     Mental Status: She is alert.  Psychiatric:        Mood and Affect: Mood normal.        Behavior: Behavior normal.     LABORATORY DATA:  CBC    Component Value Date/Time   WBC 5.4  08/27/2022 0922   RBC 4.77 08/27/2022 0922   HGB 12.7 08/27/2022 0922   HGB 10.1 (L) 03/13/2022 1210   HCT 39.1 08/27/2022 0922   PLT 223 08/27/2022 0922   PLT 196 03/13/2022 1210   MCV 82.0 08/27/2022 0922   MCH 26.6 08/27/2022 0922   MCHC 32.5 08/27/2022 0922   RDW 14.5 08/27/2022 0922   LYMPHSABS 0.8 08/27/2022 0922   MONOABS 0.3 08/27/2022 0922   EOSABS 0.2 08/27/2022 0922   BASOSABS 0.0 08/27/2022 0922    CMP     Component Value Date/Time   NA 139 08/06/2022 0814   K 4.0 08/06/2022 0814   CL 106 08/06/2022 0814   CO2 28 08/06/2022 0814   GLUCOSE 86 08/06/2022 0814   BUN 16 08/06/2022 0814   CREATININE 0.82 08/06/2022 0814   CREATININE 0.79 03/13/2022 1210   CALCIUM 9.0 08/06/2022 0814   PROT 6.1 (L) 08/06/2022 0814   ALBUMIN 4.0 08/06/2022 0814   AST 18 08/06/2022 0814   AST 18 03/13/2022 1210   ALT 11 08/06/2022 0814   ALT 12 03/13/2022 1210   ALKPHOS 60 08/06/2022 0814   BILITOT 0.6 08/06/2022 0814   BILITOT 0.4 03/13/2022 1210   GFRNONAA >60 08/06/2022 0814   GFRNONAA >60 03/13/2022 1210   GFRAA >60 06/04/2019 0201       ASSESSMENT and THERAPY PLAN:   Malignant neoplasm of upper-outer quadrant of left breast in female, estrogen receptor positive (HCC) Stephanie [name] is a 33 year old woman with recurrent left-sided stage Ia ER HER2 positive breast cancer diagnosed in October 2023.  She is status post neoadjuvant chemotherapy, lumpectomy, adjuvant radiation, and continues on maintenance Herceptin/Perjeta.    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  I placed orders  for an echocardiogram to be completed this month. Diarrhea: I recommended continue hydration and Imodium as needed. We discussed her next steps which include taking tamoxifen which she will start in the next few days.  I also gave her information and reviewed Signatera testing with her.  Sadiyah will return in 3 weeks for labs,  follow-up, and her next infusion.  She knows to let us know if she has any questions or concerns that may arise between now and her next visit with [place].  All questions were answered. The patient knows to call the clinic with any problems, questions or concerns. We can certainly see the patient much sooner if necessary.  Total encounter time:20 minutes*in face-to-face visit time, chart review, lab review, care coordination, order entry, and documentation of the encounter time.  Lillard Anes, NP 08/27/22 10:12 AM Medical Oncology and Hematology [facility] [address] [place], [place] [address] Tel. [phone]    Fax. [phone]  *Total Encounter Time as defined by the Centers for Medicare and Medicaid Services includes, in addition to the face-to-face time of a patient visit (documented in the note above) non-face-to-face time: obtaining and reviewing outside history, ordering and reviewing medications, tests or procedures, care coordination (communications with other health care professionals or caregivers) and documentation in the medical record.

## 2022-08-31 ENCOUNTER — Ambulatory Visit (HOSPITAL_COMMUNITY)
Admission: RE | Admit: 2022-08-31 | Discharge: 2022-08-31 | Disposition: A | Discharge status: Home / Self Care | Payer: BC Managed Care – PPO | Source: Ambulatory Visit | Attending: Adult Health | Admitting: Adult Health

## 2022-08-31 DIAGNOSIS — Z0189 Encounter for other specified special examinations: Secondary | ICD-10-CM

## 2022-08-31 DIAGNOSIS — Z9882 Breast implant status: Secondary | ICD-10-CM | POA: Insufficient documentation

## 2022-08-31 DIAGNOSIS — Z170 Estrogen receptor positive status [ER+]: Secondary | ICD-10-CM | POA: Diagnosis not present

## 2022-08-31 DIAGNOSIS — C50412 Malignant neoplasm of upper-outer quadrant of left female breast: Secondary | ICD-10-CM | POA: Diagnosis present

## 2022-08-31 LAB — ECHOCARDIOGRAM COMPLETE
Area-P 1/2: 3.6 cm2
Calc EF: 65.4 %
S' Lateral: 2.6 cm
Single Plane A2C EF: 74.1 %
Single Plane A4C EF: 60 %

## 2022-08-31 NOTE — Progress Notes (Signed)
  Echocardiogram 2D Echocardiogram has been performed.  Janalyn Harder 08/31/2022, 10:09 AM

## 2022-09-01 ENCOUNTER — Encounter: Payer: Self-pay | Admitting: Rehabilitation

## 2022-09-01 ENCOUNTER — Ambulatory Visit: Payer: BC Managed Care – PPO | Admitting: Rehabilitation

## 2022-09-01 DIAGNOSIS — Z9189 Other specified personal risk factors, not elsewhere classified: Secondary | ICD-10-CM

## 2022-09-01 DIAGNOSIS — C50412 Malignant neoplasm of upper-outer quadrant of left female breast: Secondary | ICD-10-CM | POA: Diagnosis not present

## 2022-09-01 DIAGNOSIS — R293 Abnormal posture: Secondary | ICD-10-CM

## 2022-09-01 DIAGNOSIS — Z483 Aftercare following surgery for neoplasm: Secondary | ICD-10-CM

## 2022-09-01 NOTE — Therapy (Signed)
OUTPATIENT PHYSICAL THERAPY BREAST CANCER TREATMENT   Patient Name: Stephanie [name] MRN: [id number] DOB:1985/12/31, 37 y.o., female Today's Date: 09/01/2022  END OF SESSION:  PT End of Session - 09/01/22 0853     Visit Number 3    Number of Visits 7    Date for PT Re-Evaluation 09/28/22    PT Start Time 0900    PT Stop Time 0946    PT Time Calculation (min) 46 min    Activity Tolerance Patient tolerated treatment well    Behavior During Therapy [facility] for tasks assessed/performed              Past Medical History:  Diagnosis Date   Atypical mole 03/14/2019   mod-left side superior   BRCA gene mutation negative in female    Breast cancer [facility])    Family history of breast cancer    Family history of lung cancer    Family history of rectal cancer    Melanoma (HCC) 10/14/2007   level II- right arm- (EXC)   PONV (postoperative nausea and vomiting)    Past Surgical History:  Procedure Laterality Date   BREAST BIOPSY Left 04/29/2022   [place] LT RADIOACTIVE SEED LOC 04/29/2022 [facility]AMMOGRAPHY   BREAST LUMPECTOMY WITH RADIOACTIVE SEED AND SENTINEL LYMPH NODE BIOPSY Left 04/30/2022   Procedure: LEFT BREAST LUMPECTOMY WITH RADIOACTIVE SEED AND SENTINEL LYMPH NODE BIOPSY;  Surgeon: [name], MD;  Location: [facility];  Service: General;  Laterality: Left;   BREAST RECONSTRUCTION WITH PLACEMENT OF TISSUE EXPANDER AND ALLODERM Bilateral 01/17/2019   Procedure: BILATERAL BREAST RECONSTRUCTION WITH PLACEMENT OF TISSUE EXPANDER AND ALLODERM;  Surgeon: Glenna Fellows, MD;  Location: [facility];  Service: Plastics;  Laterality: Bilateral;   IR IMAGING GUIDED PORT INSERTION  11/25/2021   IR RADIOLOGIST EVAL & MGMT  11/26/2021   LESION REMOVAL Left 06/02/2019   Procedure: MINOR EXICISION OF LESION,LAYERED CLOSURE 1 CM;  Surgeon: Glenna Fellows, MD;  Location: [facility];  Service: Plastics;  Laterality: Left;   LIPOSUCTION WITH LIPOFILLING  Bilateral 06/02/2019   Procedure: LIPOFILLING FROM ABDOMEN TO BILATERAL CHEST;  Surgeon: Glenna Fellows, MD;  Location: [facility];  Service: Plastics;  Laterality: Bilateral;   melanoma surgery Right 2009   upper right arm   NIPPLE SPARING MASTECTOMY WITH SENTINEL LYMPH NODE BIOPSY Bilateral 01/17/2019   Procedure: BILATERAL NIPPLE SPARING MASTECTOMIES WITH LEFT SENTINEL LYMPH NODE MAPPING;  Surgeon: [name], MD;  Location: [facility];  Service: General;  Laterality: Bilateral;   REMOVAL OF BILATERAL TISSUE EXPANDERS WITH PLACEMENT OF BILATERAL BREAST IMPLANTS Bilateral 06/02/2019   Procedure: REMOVAL OF BILATERAL TISSUE EXPANDERS WITH PLACEMENT OF BILATERAL SILICONE BREAST IMPLANTS;  Surgeon: Glenna Fellows, MD;  Location:  SURGERY CENTER;  Service: Plastics;  Laterality: Bilateral;   WISDOM TOOTH EXTRACTION     Patient Active Problem List   Diagnosis Date Noted   Port-A-Cath in place 05/14/2022   Malignant neoplasm of upper-outer quadrant of left breast in female, estrogen receptor positive (HCC) 11/18/2021   Family history of breast cancer    Family history of rectal cancer    Family history of lung cancer    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 Al Pimple  REFERRING DIAG: Left breast cancer  THERAPY DIAG:  Malignant  neoplasm of upper-outer quadrant of left breast in female, estrogen receptor positive [facility])  Aftercare following surgery for neoplasm  At risk for lymphedema  Abnormal posture  Rationale for Evaluation and Treatment: Rehabilitation  ONSET DATE: 10/29/21  SUBJECTIVE:                                                                                                                                                                                            SUBJECTIVE STATEMENT:  Feeling much better.  The first stretch in the morning is tight   PERTINENT HISTORY:  treated for left breast cancer DCIS in 2020 where she underwent bilateral nipple sparing mastectomies with implants and Lt SLNB 4 negative nodes rermoved. No radiation completed. Pt is now done with [facility] and had a left lumpectomy with SLNB on 04/30/22 with 1 negative node removed for a total of 5 removed. Completed radiation.    PATIENT GOALS:  Reassess how my recovery is going related to arm function, pain, and swelling.  PAIN:  Are you having pain? None  PRECAUTIONS: Recent Surgery, left UE Lymphedema risk  ACTIVITY LEVEL / LEISURE: back to work and activity - anything reaching up is hard but I can do it.  I am doing weight classes anything 10-15#.     OBJECTIVE:   PATIENT SURVEYS:  QUICK DASH: 15% from 9%   OBSERVATIONS: Incisions well healed.  Lateral breast incision is the most recent.  Radiation field is red. No skin openings.   POSTURE:  Slightly guarded  LYMPHEDEMA ASSESSMENT:   UPPER EXTREMITY AROM/PROM:   A/PROM RIGHT   eval    Shoulder extension 60  Shoulder flexion 160  Shoulder abduction 165  Shoulder internal rotation    Shoulder external rotation 95                          (Blank rows = not tested)   A/PROM LEFT   eval 05/22/22 08/17/22  Shoulder extension 52 55 50  Shoulder flexion 163 153 - pull in axilla  145 - pec tightness  Shoulder abduction 165 155 - pull in axilla  155 - pec   Shoulder internal rotation      Shoulder external rotation 90 90 90                          (Blank rows = not tested)   MMT UEs:  5/5 bil no pain  CERVICAL AROM: All within normal limits:    LYMPHEDEMA ASSESSMENTS:    LANDMARK RIGHT   eval  10 cm proximal  to olecranon process 31.5  Olecranon process 25.5  10 cm proximal to ulnar styloid process 21.4  Just proximal to ulnar styloid process 15.1  Across hand at thumb web space 18.7  At base of 2nd  digit 6.0  (Blank rows = not tested)   LANDMARK LEFT   eval  10 cm proximal to olecranon process 31.6  Olecranon process 26.5  10 cm proximal to ulnar styloid process 21.8  Just proximal to ulnar styloid process 15.5  Across hand at thumb web space 18.0  At base of 2nd digit 6.0  (Blank rows = not tested)  TODAY"S TREATMENT 09/01/22 Overhead pulleys flexion and abduction x 2 min Soft tissue mobilization to left pectorals, UT, lateral trunk and in SL to left scapular area and lat PROM left shoulder flexion, scaption, horizontal abduction  Supine bilateral AROM on foam roll; alternating flexion, alt horizontal abd, snow angels all x 5  08/26/2022 Overhead pulleys flexion and abduction x 2 min Soft tissue mobilization to left pectorals, UT, lateral trunk and in SL to left scapular area PROM left shoulder flexion, scaption, horizontal abduction  Supine bilateral AROM on foam roll;flexion/scaption, horizontal abd, snow angels all x 5  08/17/22 Re-eval performed Reviewed stretches per HEP section with performance of doorway for review, supine flexion, and chest stretch as pt has had these before Supine STM pectoralis with cocoa butter outside of radiation field as it is healing with arm propped overhead and in neutral.    PATIENT EDUCATION:  Education details: post op information Person educated: Patient Education method: Explanation, Demonstration, Tactile cues, Verbal cues, and Handouts Education comprehension: verbalized understanding and returned demonstration  HOME EXERCISE PROGRAM: Medbridge code lost but gave pt supine dowel flexion, chest stretch, wall walking flex and abd, and doorway stretch.   ASSESSMENT:  CLINICAL IMPRESSION: Pt is reporting less stiffness overall and feeling better.  She still has some end range and morning stiffness.   Pt will benefit from skilled therapeutic intervention to improve on the following deficits: Decreased knowledge of precautions,  impaired UE functional use, pain, decreased ROM, postural dysfunction.   PT treatment/interventions: ADL/Self care home management, Therapeutic exercises, Patient/Family education, Self Care, DME instructions, Manual therapy, and Re-evaluation   GOALS: Goals reviewed with patient? Yes  LONG TERM GOALS:  (STG=LTG)  GOALS Name Target Date  Goal status  1 Pt will demonstrate she has regained full shoulder ROM and function post operatively compared to baselines.  Baseline: 09/28/22 NEW  2 Pt will return to gym classes without limitations in overhead lifting 09/28/22 NEW  3 Pt will be ind with final HEP 09/28/22 NEW          PLAN:  PT FREQUENCY/DURATION: 1x- 2x per week x 4 weeks (6 weeks added to POC for timing)  PLAN FOR NEXT SESSION: Lt PROM, AAROM, STM to pectoralis, foam roll    Haddon Fyfe, Julieanne Manson, PT 09/01/2022, 9:46 AM

## 2022-09-04 ENCOUNTER — Inpatient Hospital Stay: Payer: BC Managed Care – PPO | Admitting: Hematology and Oncology

## 2022-09-04 ENCOUNTER — Encounter: Payer: Self-pay | Admitting: Rehabilitation

## 2022-09-04 ENCOUNTER — Ambulatory Visit: Payer: BC Managed Care – PPO | Admitting: Rehabilitation

## 2022-09-04 DIAGNOSIS — C50412 Malignant neoplasm of upper-outer quadrant of left female breast: Secondary | ICD-10-CM

## 2022-09-04 DIAGNOSIS — Z483 Aftercare following surgery for neoplasm: Secondary | ICD-10-CM

## 2022-09-04 DIAGNOSIS — R293 Abnormal posture: Secondary | ICD-10-CM

## 2022-09-04 DIAGNOSIS — Z170 Estrogen receptor positive status [ER+]: Secondary | ICD-10-CM

## 2022-09-04 DIAGNOSIS — Z9189 Other specified personal risk factors, not elsewhere classified: Secondary | ICD-10-CM

## 2022-09-04 NOTE — Progress Notes (Deleted)
[facility] Cancer Follow up:    Stephanie Pandy, MD    DIAGNOSIS:  Cancer Staging  Malignant neoplasm of upper-outer quadrant of left breast in female, estrogen receptor positive (HCC) Staging form: Breast, AJCC 8th Edition - Clinical: Stage IA (cT1c, cN0, cM0, G2, ER+, PR-, HER2+) - Signed by Rachel Moulds, MD on 11/18/2021 Histologic grading system: 3 grade system   SUMMARY OF ONCOLOGIC HISTORY: Oncology History  Malignant neoplasm of upper-outer quadrant of female breast (HCC) (Resolved)  10/20/2018 Initial Diagnosis   Malignant neoplasm of upper-outer quadrant of left breast in female, estrogen receptor positive (HCC)   10/20/2018 Cancer Staging   Staging form: Breast, AJCC 8th Edition - Clinical stage from 10/20/2018: Stage 0 (cTis (DCIS), cN0, cM0, ER+, PR-) - Signed by Lowella Dell, MD on 02/13/2020 Stage prefix: Initial diagnosis   11/03/2018 Genetic Testing   Negative genetic testing:  No pathogenic variants detected on the [facility] Multi-Cancer panel, ordered by Dr. Juliene Pina at [facility]. The report date is 11/03/2018.  The Multi-Cancer Panel offered by [facility] includes sequencing and/or deletion duplication testing of the following 84 genes: AIP, ALK, APC, ATM, AXIN2,BAP1,  BARD1, BLM, BMPR1A, BRCA1, BRCA2, BRIP1, CASR, CDC73, CDH1, CDK4, CDKN1B, CDKN1C, CDKN2A (p14ARF), CDKN2A (p16INK4a), CEBPA, CHEK2, CTNNA1, DICER1, DIS3L2, EGFR (c.2369C>T, p.Thr790Met variant only), EPCAM (Deletion/duplication testing only), FH, FLCN, GATA2, GPC3, GREM1 (Promoter region deletion/duplication testing only), HOXB13 (c.251G>A, p.Gly84Glu), HRAS, KIT, MAX, MEN1, MET, MITF (c.952G>A, p.Glu318Lys variant only), MLH1, MSH2, MSH3, MSH6, MUTYH, NBN, NF1, NF2, NTHL1, PALB2, PDGFRA, PHOX2B, PMS2, POLD1, POLE, POT1, PRKAR1A, PTCH1, PTEN, RAD50, RAD51C, RAD51D, RB1, RECQL4, RET, RUNX1, SDHAF2, SDHA (sequence changes only), SDHB, SDHC, SDHD, SMAD4, SMARCA4, SMARCB1, SMARCE1,  STK11, SUFU, TERC, TERT, TMEM127, TP53, TSC1, TSC2, VHL, WRN and WT1.     01/17/2019 Cancer Staging   Staging form: Breast, AJCC 8th Edition - Pathologic stage from 01/17/2019: Stage IA (pT58mi, pN0, cM0, G2, ER+, PR-, HER2-) - Signed by Loa Socks, NP on 02/01/2019   Malignant neoplasm of upper-outer quadrant of left breast in female, estrogen receptor positive (HCC)  10/21/2021 Breast US   Breast ultrasound of the palpable mass showed hypoechoic oval vascular mass with indistinct margins measuring 1.5 x 1 cm x 1.4 cm.  No abnormal lymph nodes found in the left axilla.  She had ultrasound-guided biopsy.  MRI scheduled for tomorrow.   10/29/2021 Pathology Results   Surgical pathology from the left breast needle core biopsy at 130 o'clock showed invasive ductal carcinoma, overall grade 2, prognostics ER 70% positive weak to moderate staining, PR 0% negative, Ki-67 of 80% and HER2 3+   11/18/2021 Cancer Staging   Staging form: Breast, AJCC 8th Edition - Clinical: Stage IA (cT1c, cN0, cM0, G2, ER+, PR-, HER2+) - Signed by Rachel Moulds, MD on 11/18/2021 Histologic grading system: 3 grade system   11/26/2021 - 03/16/2022 Chemotherapy   Patient is on Treatment Plan : BREAST  Docetaxel + Carboplatin + Trastuzumab + Pertuzumab  (TCHP) q21d      04/03/2022 -  Chemotherapy   Patient is on Treatment Plan : BREAST Trastuzumab + Pertuzumab q21d x 11 cycles     04/30/2022 Surgery   Left breast lumpectomy: no residual cancer identified, margins negative, treatment effect present, 1SLN negative for cancer.   06/24/2022 - 08/03/2022 Radiation Therapy   Adjuvant radiation therapy     CURRENT THERAPY: Herceptin/Perjeta; Tamoxifen  INTERVAL HISTORY:  Jermika Renee Cohick 37 y.o. female returns for follow-up prior to  receiving every 3-week Herceptin Perjeta.  She is tolerating this well.  Her most recent echocardiogram occurred on Jun 10, 2022 demonstrating a left ventricular ejection fraction of 65%.       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  MEDICAL HISTORY: Past Medical History:  Diagnosis Date   Atypical mole 03/14/2019   mod-left side superior   BRCA gene mutation negative in female    Breast cancer [facility])    Family history of breast cancer    Family history of lung cancer    Family history of rectal cancer    Melanoma (HCC) 10/14/2007   level II- right arm- (EXC)   PONV (postoperative nausea and vomiting)     SURGICAL HISTORY: Past Surgical History:  Procedure Laterality Date   BREAST BIOPSY Left 04/29/2022   [place] LT RADIOACTIVE SEED LOC 04/29/2022 [facility]AMMOGRAP[facility]   BREAST LUMPECTOMY WITH RADIOACTIVE SEED AND SENTINEL LYMPH NODE BIOPSY Left 04/30/2022   Procedure: LEFT BREAST LUMPECTOMY WITH RADIOACTIVE SEED AND SENTINEL LYMPH NODE BIOPSY;  Surgeon: [name], MD;  Location: [facility];  Service: General;  Laterality: Left;   BREAST RECONSTRUCTION WITH PLACEMENT OF TISSUE EXPANDER AND ALLODERM Bilateral 01/17/2019   Procedure: BILATERAL BREAST RECONSTRUCTION WITH PLACEMENT OF TISSUE EXPANDER AND ALLODERM;  Surgeon: Glenna Fellows, MD;  Location: [facility];  Service: Plastics;  Laterality: Bilateral;   IR IMAGING GUIDED PORT INSERTION  11/25/2021   IR RADIOLOGIST EVAL & MGMT  11/26/2021   LESION REMOVAL Left 06/02/2019   Procedure: MINOR EXICISION OF LESION,LAYERED CLOSURE 1 CM;  Surgeon: Glenna Fellows, MD;  Location: [facility];  Service:  Plastics;  Laterality: Left;   LIPOSUCTION WITH LIPOFILLING Bilateral 06/02/2019   Procedure: LIPOFILLING FROM ABDOMEN TO BILATERAL CHEST;  Surgeon: Glenna Fellows, MD;  Location: [facility];  Service: Plastics;  Laterality: Bilateral;   melanoma surgery Right 2009   upper right arm   NIPPLE SPARING MASTECTOMY WITH SENTINEL LYMPH NODE BIOPSY Bilateral 01/17/2019   Procedure: BILATERAL NIPPLE SPARING MASTECTOMIES WITH LEFT SENTINEL LYMPH NODE MAPPING;  Surgeon: [name], MD;  Location: [facility];  Service: General;  Laterality: Bilateral;   REMOVAL OF BILATERAL TISSUE EXPANDERS WITH PLACEMENT OF BILATERAL BREAST IMPLANTS Bilateral 06/02/2019   Procedure: REMOVAL OF BILATERAL TISSUE EXPANDERS WITH PLACEMENT OF BILATERAL SILICONE BREAST IMPLANTS;  Surgeon: Glenna Fellows, MD;  Location:  SURGERY CENTER;  Service: Plastics;  Laterality: Bilateral;   WISDOM TOOTH EXTRACTION      SOCIAL HISTORY: Social History   Socioeconomic History   Marital status: Married    Spouse name: Not on file   Number of children: Not on file   Years of education: Not on file   Highest education level: Not on file  Occupational History   Not on file  Tobacco Use   Smoking status: Never   Smokeless tobacco: Never  Vaping Use   Vaping status: Never Used  Substance and Sexual Activity  Alcohol use: Yes    Comment: occasional   Drug use: Never   Sexual activity: Yes    Birth control/protection: None  Other Topics Concern   Not on file  Social History Narrative   Not on file   Social Determinants of Health   Financial Resource Strain: Not on file  Food Insecurity: No Food Insecurity (05/25/2022)   Hunger Vital Sign    Worried About Running Out of Food in the Last Year: Never true    Ran Out of Food in the Last Year: Never true  Transportation Needs: No Transportation Needs (05/25/2022)   PRAPARE - Administrator, Civil Service (Medical): No     Lack of Transportation (Non-Medical): No  Physical Activity: Not on file  Stress: Not on file  Social Connections: Not on file  Intimate Partner Violence: Not At Risk (05/25/2022)   Humiliation, Afraid, Rape, and Kick questionnaire    Fear of Current or Ex-Partner: No    Emotionally Abused: No    Physically Abused: No    Sexually Abused: No    FAMILY HISTORY: Family History  Problem Relation Age of Onset   Hypertension Mother    Heart disease Father    Breast cancer Maternal Aunt 71 - 69   Breast cancer Maternal Aunt 30 - 59   Lung cancer Paternal Aunt        diagnosed late 63s   Breast cancer Maternal Grandmother 47 - 69   Breast cancer Paternal Grandmother 8 - 48   Lung cancer Paternal Grandmother        thought to be breast cancer mets   Rectal cancer Paternal Grandmother        diagnosed 20s   Lung cancer Paternal Grandfather        diagnosed 8s    Review of Systems  Constitutional:  Negative for appetite change, chills, fatigue, fever and unexpected weight change.  HENT:   Negative for hearing loss, lump/mass and trouble swallowing.   Eyes:  Negative for eye problems and icterus.  Respiratory:  Negative for chest tightness, cough and shortness of breath.   Cardiovascular:  Negative for chest pain, leg swelling and palpitations.  Gastrointestinal:  Positive for diarrhea. Negative for abdominal distention, abdominal pain, blood in stool, constipation, nausea and vomiting.  Endocrine: Negative for hot flashes.  Genitourinary:  Negative for difficulty urinating.   Musculoskeletal:  Negative for arthralgias.  Skin:  Negative for itching and rash.  Neurological:  Negative for dizziness, extremity weakness, headaches and numbness.  Hematological:  Negative for adenopathy. Does not bruise/bleed easily.  Psychiatric/Behavioral:  Negative for depression. The patient is not nervous/anxious.       PHYSICAL EXAMINATION     There were no vitals filed for this  visit.   Physical Exam Constitutional:      General: She is not in acute distress.    Appearance: Normal appearance. She is not toxic-appearing.  HENT:     Head: Normocephalic and atraumatic.     Mouth/Throat:     Mouth: Mucous membranes are moist.     Pharynx: Oropharynx is clear. No oropharyngeal exudate or posterior oropharyngeal erythema.  Eyes:     General: No scleral icterus. Cardiovascular:     Rate and Rhythm: Normal rate and regular rhythm.     Pulses: Normal pulses.     Heart sounds: Normal heart sounds.  Pulmonary:     Effort: Pulmonary effort is normal.     Breath sounds: Normal breath  sounds.  Abdominal:     General: Abdomen is flat. Bowel sounds are normal. There is no distension.     Palpations: Abdomen is soft.     Tenderness: There is no abdominal tenderness.  Musculoskeletal:        General: No swelling.     Cervical back: Neck supple.  Lymphadenopathy:     Cervical: No cervical adenopathy.  Skin:    General: Skin is warm and dry.     Findings: No rash.  Neurological:     General: No focal deficit present.     Mental Status: She is alert.  Psychiatric:        Mood and Affect: Mood normal.        Behavior: Behavior normal.     LABORATORY DATA:  CBC    Component Value Date/Time   WBC 5.4 08/27/2022 0922   RBC 4.77 08/27/2022 0922   HGB 12.7 08/27/2022 0922   HGB 10.1 (L) 03/13/2022 1210   HCT 39.1 08/27/2022 0922   PLT 223 08/27/2022 0922   PLT 196 03/13/2022 1210   MCV 82.0 08/27/2022 0922   MCH 26.6 08/27/2022 0922   MCHC 32.5 08/27/2022 0922   RDW 14.5 08/27/2022 0922   LYMPHSABS 0.8 08/27/2022 0922   MONOABS 0.3 08/27/2022 0922   EOSABS 0.2 08/27/2022 0922   BASOSABS 0.0 08/27/2022 0922    CMP     Component Value Date/Time   NA 139 08/27/2022 0922   K 4.2 08/27/2022 0922   CL 107 08/27/2022 0922   CO2 27 08/27/2022 0922   GLUCOSE 96 08/27/2022 0922   BUN 18 08/27/2022 0922   CREATININE 0.91 08/27/2022 0922   CREATININE 0.79  03/13/2022 1210   CALCIUM 8.4 (L) 08/27/2022 0922   PROT 6.7 08/27/2022 0922   ALBUMIN 4.1 08/27/2022 0922   AST 15 08/27/2022 0922   AST 18 03/13/2022 1210   ALT 9 08/27/2022 0922   ALT 12 03/13/2022 1210   ALKPHOS 70 08/27/2022 0922   BILITOT 0.5 08/27/2022 0922   BILITOT 0.4 03/13/2022 1210   GFRNONAA >60 08/27/2022 0922   GFRNONAA >60 03/13/2022 1210   GFRAA >60 06/04/2019 0201       ASSESSMENT and THERAPY PLAN:   No problem-specific Assessment & Plan notes found for this encounter.   All questions were answered. The patient knows to call the clinic with any problems, questions or concerns. We can certainly see the patient much sooner if necessary.  Total encounter time:20 minutes*in face-to-face visit time, chart review, lab review, care coordination, order entry, and documentation of the encounter time.    *Total Encounter Time as defined by the Centers for Medicare and Medicaid Services includes, in addition to the face-to-face time of a patient visit (documented in the note above) non-face-to-face time: obtaining and reviewing outside history, ordering and reviewing medications, tests or procedures, care coordination (communications with other health care professionals or caregivers) and documentation in the medical record.

## 2022-09-04 NOTE — Therapy (Signed)
OUTPATIENT PHYSICAL THERAPY BREAST CANCER TREATMENT   Patient Name: Stephanie [name] MRN: [id number] DOB:21-Apr-1985, 37 y.o., female Today's Date: 09/04/2022  END OF SESSION:  PT End of Session - 09/04/22 0853     Visit Number 4    Number of Visits 7    Date for PT Re-Evaluation 09/28/22    PT Start Time 0800    PT Stop Time 0850    PT Time Calculation (min) 50 min    Activity Tolerance Patient tolerated treatment well    Behavior During Therapy [facility] for tasks assessed/performed               Past Medical History:  Diagnosis Date   Atypical mole 03/14/2019   mod-left side superior   BRCA gene mutation negative in female    Breast cancer [facility])    Family history of breast cancer    Family history of lung cancer    Family history of rectal cancer    Melanoma (HCC) 10/14/2007   level II- right arm- (EXC)   PONV (postoperative nausea and vomiting)    Past Surgical History:  Procedure Laterality Date   BREAST BIOPSY Left 04/29/2022   [place] LT RADIOACTIVE SEED LOC 04/29/2022 [facility]AMMOGRAP[facility]   BREAST LUMPECTOMY WITH RADIOACTIVE SEED AND SENTINEL LYMPH NODE BIOPSY Left 04/30/2022   Procedure: LEFT BREAST LUMPECTOMY WITH RADIOACTIVE SEED AND SENTINEL LYMPH NODE BIOPSY;  Surgeon: [name], MD;  Location: [facility];  Service: General;  Laterality: Left;   BREAST RECONSTRUCTION WITH PLACEMENT OF TISSUE EXPANDER AND ALLODERM Bilateral 01/17/2019   Procedure: BILATERAL BREAST RECONSTRUCTION WITH PLACEMENT OF TISSUE EXPANDER AND ALLODERM;  Surgeon: Glenna Fellows, MD;  Location: [facility];  Service: Plastics;  Laterality: Bilateral;   IR IMAGING GUIDED PORT INSERTION  11/25/2021   IR RADIOLOGIST EVAL & MGMT  11/26/2021   LESION REMOVAL Left 06/02/2019   Procedure: MINOR EXICISION OF LESION,LAYERED CLOSURE 1 CM;  Surgeon: Glenna Fellows, MD;  Location: [facility];  Service: Plastics;  Laterality: Left;   LIPOSUCTION WITH  LIPOFILLING Bilateral 06/02/2019   Procedure: LIPOFILLING FROM ABDOMEN TO BILATERAL CHEST;  Surgeon: Glenna Fellows, MD;  Location: [facility];  Service: Plastics;  Laterality: Bilateral;   melanoma surgery Right 2009   upper right arm   NIPPLE SPARING MASTECTOMY WITH SENTINEL LYMPH NODE BIOPSY Bilateral 01/17/2019   Procedure: BILATERAL NIPPLE SPARING MASTECTOMIES WITH LEFT SENTINEL LYMPH NODE MAPPING;  Surgeon: [name], MD;  Location: [facility];  Service: General;  Laterality: Bilateral;   REMOVAL OF BILATERAL TISSUE EXPANDERS WITH PLACEMENT OF BILATERAL BREAST IMPLANTS Bilateral 06/02/2019   Procedure: REMOVAL OF BILATERAL TISSUE EXPANDERS WITH PLACEMENT OF BILATERAL SILICONE BREAST IMPLANTS;  Surgeon: Glenna Fellows, MD;  Location: [facility];  Service: Plastics;  Laterality: Bilateral;   WISDOM TOOTH EXTRACTION     Patient Active Problem List   Diagnosis Date Noted   Port-A-Cath in place 05/14/2022   Malignant neoplasm of upper-outer quadrant of left breast in female, estrogen receptor positive (HCC) 11/18/2021   Family history of breast cancer    Family history of rectal cancer    Family history of lung cancer    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 Al Pimple  REFERRING DIAG: Left breast cancer  THERAPY DIAG:  Malignant neoplasm of upper-outer quadrant of left breast in female, estrogen receptor positive [facility])  Aftercare following surgery for neoplasm  At risk for lymphedema  Abnormal posture  Rationale for Evaluation and Treatment: Rehabilitation  ONSET DATE: 10/29/21  SUBJECTIVE:                                                                                                                                                                                            SUBJECTIVE STATEMENT:  Feeling much better.   PERTINENT HISTORY:  treated for left breast cancer DCIS in 2020 where she underwent bilateral nipple sparing mastectomies with implants and Lt SLNB 4 negative nodes rermoved. No radiation completed. Pt is now done with [facility] and had a left lumpectomy with SLNB on 04/30/22 with 1 negative node removed for a total of 5 removed. Completed radiation.    PATIENT GOALS:  Reassess how my recovery is going related to arm function, pain, and swelling.  PAIN:  Are you having pain? None  PRECAUTIONS: Recent Surgery, left UE Lymphedema risk  ACTIVITY LEVEL / LEISURE: back to work and activity - anything reaching up is hard but I can do it.  I am doing weight classes anything 10-15#.     OBJECTIVE:   PATIENT SURVEYS:  QUICK DASH: 15% from 9%   OBSERVATIONS: Incisions well healed.  Lateral breast incision is the most recent.  Radiation field is red. No skin openings.   POSTURE:  Slightly guarded  LYMPHEDEMA ASSESSMENT:   UPPER EXTREMITY AROM/PROM:   A/PROM RIGHT   eval    Shoulder extension 60  Shoulder flexion 160  Shoulder abduction 165  Shoulder internal rotation    Shoulder external rotation 95                          (Blank rows = not tested)   A/PROM LEFT   eval 05/22/22 08/17/22  Shoulder extension 52 55 50  Shoulder flexion 163 153 - pull in axilla  145 - pec tightness  Shoulder abduction 165 155 - pull in axilla  155 - pec   Shoulder internal rotation      Shoulder external rotation 90 90 90                          (Blank rows = not tested)   MMT UEs:  5/5 bil no pain  CERVICAL AROM: All within normal limits:    LYMPHEDEMA ASSESSMENTS:    LANDMARK RIGHT   eval  10 cm proximal to olecranon process 31.5  Olecranon process 25.5  10 cm proximal to ulnar styloid process 21.4  Just proximal to ulnar styloid process 15.1  Across hand at thumb web space 18.7  At base of 2nd digit 6.0  (Blank rows = not  tested)   LANDMARK LEFT   eval  10 cm proximal to olecranon process 31.6  Olecranon process 26.5  10 cm proximal to ulnar styloid process 21.8  Just proximal to ulnar styloid process 15.5  Across hand at thumb web space 18.0  At base of 2nd digit 6.0  (Blank rows = not tested)  TODAY"S TREATMENT 09/04/22 Overhead pulleys flexion and abduction x 2 min Soft tissue mobilization to left pectorals, UT, lateral trunk and in SL to left scapular area and lat PROM left shoulder flexion, scaption, horizontal abduction  Supine bilateral AROM on foam roll; alternating flexion, alt horizontal abd, snow angels all x 5 Supine scap with yellow, horizontal abduction 2x10, ER bil 2x10, diagonals x 10 each Goal check  09/01/22 Overhead pulleys flexion and abduction x 2 min Soft tissue mobilization to left pectorals, UT, lateral trunk and in SL to left scapular area and lat PROM left shoulder flexion, scaption, horizontal abduction  Supine bilateral AROM on foam roll; alternating flexion, alt horizontal abd, snow angels all x 5  08/26/2022 Overhead pulleys flexion and abduction x 2 min Soft tissue mobilization to left pectorals, UT, lateral trunk and in SL to left scapular area PROM left shoulder flexion, scaption, horizontal abduction  Supine bilateral AROM on foam roll;flexion/scaption, horizontal abd, snow angels all x 5  08/17/22 Re-eval performed Reviewed stretches per HEP section with performance of doorway for review, supine flexion, and chest stretch as pt has had these before Supine STM pectoralis with cocoa butter outside of radiation field as it is healing with arm propped overhead and in neutral.    PATIENT EDUCATION:  Education details: post op information Person educated: Patient Education method: Explanation, Demonstration, Tactile cues, Verbal cues, and Handouts Education comprehension: verbalized understanding and returned demonstration  HOME EXERCISE PROGRAM: Medbridge code lost  but gave pt supine dowel flexion, chest stretch, wall walking flex and abd, and doorway stretch  ASSESSMENT:  CLINICAL IMPRESSION: Pt is reporting less stiffness overall and feeling better.  She still has some end range and morning stiffness but has met all of her goals and is ready for DC.  She will continue with [facility] screenings.   Pt will benefit from skilled therapeutic intervention to improve on the following deficits: Decreased knowledge of precautions, impaired UE functional use, pain, decreased ROM, postural dysfunction.   PT treatment/interventions: ADL/Self care home management, Therapeutic exercises, Patient/Family education, Self Care, DME instructions, Manual therapy, and Re-evaluation   GOALS: Goals reviewed with patient? Yes  LONG TERM GOALS:  (STG=LTG)  GOALS Name Target Date  Goal status  1 Pt will demonstrate she has regained full shoulder ROM and function post operatively compared to baselines.  Baseline: 09/28/22 MET  2 Pt will return to gym classes without limitations in overhead lifting 09/28/22 MET  3 Pt will be ind with final HEP 09/28/22 MET          PLAN:  PT FREQUENCY/DURATION: 1x- 2x per week x 4 weeks (6 weeks added to POC for timing)  PLAN FOR NEXT SESSION: (give supine scap) Lt PROM, AAROM, STM to pectoralis, foam roll   PHYSICAL THERAPY DISCHARGE SUMMARY  Visits from Start of Care: 4  Current functional level related to goals / functional outcomes: All goals met   Remaining deficits:  Lymphedema risk   Education / Equipment: HEP  Plan: Patient agrees to discharge.  Patient is being discharged due to meeting the stated rehab goals.        Idamae Lusher, PT 09/04/2022, 8:53 AM

## 2022-09-08 ENCOUNTER — Ambulatory Visit: Payer: BC Managed Care – PPO | Admitting: Rehabilitation

## 2022-09-10 ENCOUNTER — Encounter: Payer: Self-pay | Admitting: Radiation Oncology

## 2022-09-10 ENCOUNTER — Ambulatory Visit
Admission: RE | Admit: 2022-09-10 | Discharge: 2022-09-10 | Disposition: A | Discharge status: Home / Self Care | Payer: BC Managed Care – PPO | Source: Ambulatory Visit | Attending: Radiation Oncology | Admitting: Radiation Oncology

## 2022-09-10 ENCOUNTER — Telehealth: Payer: Self-pay

## 2022-09-10 ENCOUNTER — Ambulatory Visit: Payer: BC Managed Care – PPO | Admitting: Rehabilitation

## 2022-09-10 NOTE — Telephone Encounter (Signed)
Rn called pt for telephone follow up. She was dong well overall. Note completed and routed to Dr. Basilio Cairo.

## 2022-09-16 ENCOUNTER — Other Ambulatory Visit: Payer: Self-pay

## 2022-09-21 ENCOUNTER — Other Ambulatory Visit: Payer: Self-pay

## 2022-09-21 ENCOUNTER — Inpatient Hospital Stay: Payer: BC Managed Care – PPO

## 2022-09-21 ENCOUNTER — Inpatient Hospital Stay: Payer: BC Managed Care – PPO | Attending: Hematology and Oncology | Admitting: Hematology and Oncology

## 2022-09-21 VITALS — BP 107/61 | HR 63 | Resp 17

## 2022-09-21 DIAGNOSIS — Z79810 Long term (current) use of selective estrogen receptor modulators (SERMs): Secondary | ICD-10-CM | POA: Insufficient documentation

## 2022-09-21 DIAGNOSIS — Z5112 Encounter for antineoplastic immunotherapy: Secondary | ICD-10-CM | POA: Diagnosis present

## 2022-09-21 DIAGNOSIS — Z170 Estrogen receptor positive status [ER+]: Secondary | ICD-10-CM | POA: Insufficient documentation

## 2022-09-21 DIAGNOSIS — C50412 Malignant neoplasm of upper-outer quadrant of left female breast: Secondary | ICD-10-CM | POA: Diagnosis present

## 2022-09-21 DIAGNOSIS — Z803 Family history of malignant neoplasm of breast: Secondary | ICD-10-CM | POA: Diagnosis not present

## 2022-09-21 DIAGNOSIS — Z923 Personal history of irradiation: Secondary | ICD-10-CM | POA: Insufficient documentation

## 2022-09-21 DIAGNOSIS — Z95828 Presence of other vascular implants and grafts: Secondary | ICD-10-CM

## 2022-09-21 DIAGNOSIS — Z801 Family history of malignant neoplasm of trachea, bronchus and lung: Secondary | ICD-10-CM | POA: Insufficient documentation

## 2022-09-21 DIAGNOSIS — Z800 Family history of malignant neoplasm of digestive organs: Secondary | ICD-10-CM | POA: Insufficient documentation

## 2022-09-21 LAB — COMPREHENSIVE METABOLIC PANEL
ALT: 7 U/L (ref 0–44)
AST: 14 U/L — ABNORMAL LOW (ref 15–41)
Albumin: 4 g/dL (ref 3.5–5.0)
Alkaline Phosphatase: 49 U/L (ref 38–126)
Anion gap: 5 (ref 5–15)
BUN: 17 mg/dL (ref 6–20)
CO2: 25 mmol/L (ref 22–32)
Calcium: 8.6 mg/dL — ABNORMAL LOW (ref 8.9–10.3)
Chloride: 109 mmol/L (ref 98–111)
Creatinine, Ser: 0.98 mg/dL (ref 0.44–1.00)
GFR, Estimated: >60 mL/min (ref >60)
Glucose, Bld: 90 mg/dL (ref 70–99)
Potassium: 4.4 mmol/L (ref 3.5–5.1)
Sodium: 139 mmol/L (ref 135–145)
Total Bilirubin: 0.6 mg/dL (ref 0.3–1.2)
Total Protein: 6.5 g/dL (ref 6.5–8.1)

## 2022-09-21 LAB — CBC WITH DIFFERENTIAL/PLATELET
Abs Immature Granulocytes: 0.02 10*3/uL (ref 0.00–0.07)
Basophils Absolute: 0 10*3/uL (ref 0.0–0.1)
Basophils Relative: 0 %
Eosinophils Absolute: 0.1 10*3/uL (ref 0.0–0.5)
Eosinophils Relative: 1 %
HCT: 37.2 % (ref 36.0–46.0)
Hemoglobin: 12.4 g/dL (ref 12.0–15.0)
Immature Granulocytes: 0 %
Lymphocytes Relative: 13 %
Lymphs Abs: 1 10*3/uL (ref 0.7–4.0)
MCH: 27.1 pg (ref 26.0–34.0)
MCHC: 33.3 g/dL (ref 30.0–36.0)
MCV: 81.4 fL (ref 80.0–100.0)
Monocytes Absolute: 0.4 10*3/uL (ref 0.1–1.0)
Monocytes Relative: 5 %
Neutro Abs: 5.6 10*3/uL (ref 1.7–7.7)
Neutrophils Relative %: 81 %
Platelets: 190 10*3/uL (ref 150–400)
RBC: 4.57 MIL/uL (ref 3.87–5.11)
RDW: 13.4 % (ref 11.5–15.5)
WBC: 7.1 10*3/uL (ref 4.0–10.5)
nRBC: 0 % (ref 0.0–0.2)

## 2022-09-21 MED ORDER — ACETAMINOPHEN 325 MG PO TABS
650.0000 mg | ORAL_TABLET | Freq: Once | ORAL | Status: AC
  Administered 2022-09-21: 650 mg via ORAL
  Filled 2022-09-21: qty 2

## 2022-09-21 MED ORDER — SODIUM CHLORIDE 0.9% FLUSH
10.0000 mL | Freq: Once | INTRAVENOUS | Status: AC
  Administered 2022-09-21: 10 mL

## 2022-09-21 MED ORDER — TRASTUZUMAB-ANNS CHEMO 150 MG IV SOLR
6.0000 mg/kg | Freq: Once | INTRAVENOUS | Status: AC
  Administered 2022-09-21: 420 mg via INTRAVENOUS
  Filled 2022-09-21: qty 20

## 2022-09-21 MED ORDER — SODIUM CHLORIDE 0.9% FLUSH
10.0000 mL | INTRAVENOUS | Status: DC | PRN
  Administered 2022-09-21: 10 mL

## 2022-09-21 MED ORDER — SODIUM CHLORIDE 0.9 % IV SOLN
420.0000 mg | Freq: Once | INTRAVENOUS | Status: AC
  Administered 2022-09-21: 420 mg via INTRAVENOUS
  Filled 2022-09-21: qty 14

## 2022-09-21 MED ORDER — DIPHENHYDRAMINE HCL 25 MG PO CAPS
50.0000 mg | ORAL_CAPSULE | Freq: Once | ORAL | Status: AC
  Administered 2022-09-21: 50 mg via ORAL
  Filled 2022-09-21: qty 2

## 2022-09-21 MED ORDER — HEPARIN SOD (PORK) LOCK FLUSH 100 UNIT/ML IV SOLN
500.0000 [IU] | Freq: Once | INTRAVENOUS | Status: AC | PRN
  Administered 2022-09-21: 500 [IU]

## 2022-09-21 MED ORDER — SODIUM CHLORIDE 0.9 % IV SOLN: Freq: Once | INTRAVENOUS | Status: AC

## 2022-09-21 NOTE — Progress Notes (Signed)
[facility] Cancer Follow up:    Stephanie Pandy, MD    DIAGNOSIS:  Cancer Staging  Malignant neoplasm of upper-outer quadrant of left breast in female, estrogen receptor positive (HCC) Staging form: Breast, AJCC 8th Edition - Clinical: Stage IA (cT1c, cN0, cM0, G2, ER+, PR-, HER2+) - Signed by Rachel Moulds, MD on 11/18/2021 Histologic grading system: 3 grade system   SUMMARY OF ONCOLOGIC HISTORY: Oncology History  Malignant neoplasm of upper-outer quadrant of female breast (HCC) (Resolved)  10/20/2018 Initial Diagnosis   Malignant neoplasm of upper-outer quadrant of left breast in female, estrogen receptor positive (HCC)   10/20/2018 Cancer Staging   Staging form: Breast, AJCC 8th Edition - Clinical stage from 10/20/2018: Stage 0 (cTis (DCIS), cN0, cM0, ER+, PR-) - Signed by Lowella Dell, MD on 02/13/2020 Stage prefix: Initial diagnosis   11/03/2018 Genetic Testing   Negative genetic testing:  No pathogenic variants detected on the [facility] Multi-Cancer panel, ordered by Dr. Juliene Pina at [facility]. The report date is 11/03/2018.  The Multi-Cancer Panel offered by [facility] includes sequencing and/or deletion duplication testing of the following 84 genes: AIP, ALK, APC, ATM, AXIN2,BAP1,  BARD1, BLM, BMPR1A, BRCA1, BRCA2, BRIP1, CASR, CDC73, CDH1, CDK4, CDKN1B, CDKN1C, CDKN2A (p14ARF), CDKN2A (p16INK4a), CEBPA, CHEK2, CTNNA1, DICER1, DIS3L2, EGFR (c.2369C>T, p.Thr790Met variant only), EPCAM (Deletion/duplication testing only), FH, FLCN, GATA2, GPC3, GREM1 (Promoter region deletion/duplication testing only), HOXB13 (c.251G>A, p.Gly84Glu), HRAS, KIT, MAX, MEN1, MET, MITF (c.952G>A, p.Glu318Lys variant only), MLH1, MSH2, MSH3, MSH6, MUTYH, NBN, NF1, NF2, NTHL1, PALB2, PDGFRA, PHOX2B, PMS2, POLD1, POLE, POT1, PRKAR1A, PTCH1, PTEN, RAD50, RAD51C, RAD51D, RB1, RECQL4, RET, RUNX1, SDHAF2, SDHA (sequence changes only), SDHB, SDHC, SDHD, SMAD4, SMARCA4, SMARCB1, SMARCE1,  STK11, SUFU, TERC, TERT, TMEM127, TP53, TSC1, TSC2, VHL, WRN and WT1.     01/17/2019 Cancer Staging   Staging form: Breast, AJCC 8th Edition - Pathologic stage from 01/17/2019: Stage IA (pT94mi, pN0, cM0, G2, ER+, PR-, HER2-) - Signed by Loa Socks, NP on 02/01/2019   Malignant neoplasm of upper-outer quadrant of left breast in female, estrogen receptor positive (HCC)  10/21/2021 Breast US   Breast ultrasound of the palpable mass showed hypoechoic oval vascular mass with indistinct margins measuring 1.5 x 1 cm x 1.4 cm.  No abnormal lymph nodes found in the left axilla.  She had ultrasound-guided biopsy.  MRI scheduled for tomorrow.   10/29/2021 Pathology Results   Surgical pathology from the left breast needle core biopsy at 130 o'clock showed invasive ductal carcinoma, overall grade 2, prognostics ER 70% positive weak to moderate staining, PR 0% negative, Ki-67 of 80% and HER2 3+   11/18/2021 Cancer Staging   Staging form: Breast, AJCC 8th Edition - Clinical: Stage IA (cT1c, cN0, cM0, G2, ER+, PR-, HER2+) - Signed by Rachel Moulds, MD on 11/18/2021 Histologic grading system: 3 grade system   11/26/2021 - 03/16/2022 Chemotherapy   Patient is on Treatment Plan : BREAST  Docetaxel + Carboplatin + Trastuzumab + Pertuzumab  (TCHP) q21d      04/03/2022 -  Chemotherapy   Patient is on Treatment Plan : BREAST Trastuzumab + Pertuzumab q21d x 11 cycles     04/30/2022 Surgery   Left breast lumpectomy: no residual cancer identified, margins negative, treatment effect present, 1SLN negative for cancer.   06/24/2022 - 08/03/2022 Radiation Therapy   Adjuvant radiation therapy     CURRENT THERAPY: Herceptin/Perjeta; Tamoxifen  INTERVAL HISTORY:  Stephanie [name] 37 y.o. female returns for follow-up prior to  receiving every 3-week Herceptin Perjeta.  She is tolerating this well.  Her most recent echocardiogram occurred on Jun 10, 2022 demonstrating a left ventricular ejection fraction of 65%.   Her main issue with the Herceptin Perjeta is diarrhea.  She has some urgency with diarrhea,  takes an imodium. She started taking tamoxifen Aug 19th 2024, so far no complaints. Rest of the pertinent 10 points ROS reviewed and neg.   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  MEDICAL HISTORY: Past Medical History:  Diagnosis Date   Atypical mole 03/14/2019   mod-left side superior   BRCA gene mutation negative in female    Breast cancer [facility])    Family history of breast cancer    Family history of lung cancer    Family history of rectal cancer    Melanoma (HCC) 10/14/2007   level II- right arm- (EXC)   PONV (postoperative nausea and vomiting)     SURGICAL HISTORY: Past Surgical History:  Procedure Laterality Date   BREAST BIOPSY Left 04/29/2022   [place] LT RADIOACTIVE SEED LOC 04/29/2022 [facility] MAMMOGRAPHY   BREAST LUMPECTOMY WITH RADIOACTIVE SEED AND SENTINEL LYMPH NODE BIOPSY Left 04/30/2022   Procedure: LEFT BREAST LUMPECTOMY WITH RADIOACTIVE SEED AND SENTINEL LYMPH NODE BIOPSY;  Surgeon: [name], MD;  Location: [facility];  Service: General;  Laterality: Left;   BREAST RECONSTRUCTION WITH PLACEMENT OF TISSUE EXPANDER AND ALLODERM Bilateral 01/17/2019   Procedure: BILATERAL BREAST RECONSTRUCTION WITH PLACEMENT OF TISSUE EXPANDER AND ALLODERM;  Surgeon: Glenna Fellows, MD;  Location: [facility];  Service: Plastics;  Laterality: Bilateral;   IR IMAGING GUIDED PORT INSERTION   11/25/2021   IR RADIOLOGIST EVAL & MGMT  11/26/2021   LESION REMOVAL Left 06/02/2019   Procedure: MINOR EXICISION OF LESION,LAYERED CLOSURE 1 CM;  Surgeon: Glenna Fellows, MD;  Location: [facility];  Service: Plastics;  Laterality: Left;   LIPOSUCTION WITH LIPOFILLING Bilateral 06/02/2019   Procedure: LIPOFILLING FROM ABDOMEN TO BILATERAL CHEST;  Surgeon: Glenna Fellows, MD;  Location: [facility];  Service: Plastics;  Laterality: Bilateral;   melanoma surgery Right 2009   upper right arm   NIPPLE SPARING MASTECTOMY WITH SENTINEL LYMPH NODE BIOPSY Bilateral 01/17/2019   Procedure: BILATERAL NIPPLE SPARING MASTECTOMIES WITH LEFT SENTINEL LYMPH NODE MAPPING;  Surgeon: [name], MD;  Location: [facility];  Service: General;  Laterality: Bilateral;   REMOVAL OF BILATERAL TISSUE EXPANDERS WITH PLACEMENT OF BILATERAL BREAST IMPLANTS Bilateral 06/02/2019   Procedure: REMOVAL OF BILATERAL TISSUE EXPANDERS WITH PLACEMENT OF BILATERAL SILICONE BREAST IMPLANTS;  Surgeon: Glenna Fellows, MD;  Location:  SURGERY CENTER;  Service: Plastics;  Laterality: Bilateral;   WISDOM TOOTH EXTRACTION      SOCIAL HISTORY: Social History   Socioeconomic History   Marital status: Married    Spouse name: Not on file   Number of children: Not on file   Years of education: Not on file   Highest education  level: Not on file  Occupational History   Not on file  Tobacco Use   Smoking status: Never   Smokeless tobacco: Never  Vaping Use   Vaping status: Never Used  Substance and Sexual Activity   Alcohol use: Yes    Comment: occasional   Drug use: Never   Sexual activity: Yes    Birth control/protection: None  Other Topics Concern   Not on file  Social History Narrative   Not on file   Social Determinants of Health   Financial Resource Strain: Not on file  Food Insecurity: No Food Insecurity (05/25/2022)   Hunger Vital Sign    Worried About  Running Out of Food in the Last Year: Never true    Ran Out of Food in the Last Year: Never true  Transportation Needs: No Transportation Needs (05/25/2022)   PRAPARE - Administrator, Civil Service (Medical): No    Lack of Transportation (Non-Medical): No  Physical Activity: Not on file  Stress: Not on file  Social Connections: Not on file  Intimate Partner Violence: Not At Risk (05/25/2022)   Humiliation, Afraid, Rape, and Kick questionnaire    Fear of Current or Ex-Partner: No    Emotionally Abused: No    Physically Abused: No    Sexually Abused: No    FAMILY HISTORY: Family History  Problem Relation Age of Onset   Hypertension Mother    Heart disease Father    Breast cancer Maternal Aunt 79 - 69   Breast cancer Maternal Aunt 37 - 59   Lung cancer Paternal Aunt        diagnosed late 33s   Breast cancer Maternal Grandmother 70 - 69   Breast cancer Paternal Grandmother 42 - 31   Lung cancer Paternal Grandmother        thought to be breast cancer mets   Rectal cancer Paternal Grandmother        diagnosed 26s   Lung cancer Paternal Grandfather        diagnosed 63s    Review of Systems  Constitutional:  Negative for appetite change, chills, fatigue, fever and unexpected weight change.  HENT:   Negative for hearing loss, lump/mass and trouble swallowing.   Eyes:  Negative for eye problems and icterus.  Respiratory:  Negative for chest tightness, cough and shortness of breath.   Cardiovascular:  Negative for chest pain, leg swelling and palpitations.  Gastrointestinal:  Positive for diarrhea. Negative for abdominal distention, abdominal pain, blood in stool, constipation, nausea and vomiting.  Endocrine: Negative for hot flashes.  Genitourinary:  Negative for difficulty urinating.   Musculoskeletal:  Negative for arthralgias.  Skin:  Negative for itching and rash.  Neurological:  Negative for dizziness, extremity weakness, headaches and numbness.  Hematological:   Negative for adenopathy. Does not bruise/bleed easily.  Psychiatric/Behavioral:  Negative for depression. The patient is not nervous/anxious.       PHYSICAL EXAMINATION   Vitals:   09/21/22 0958  BP: 97/63  Pulse: (!) 57  Resp: 18  Temp: 97.7 F (36.5 C)  SpO2: 100%   Physical Exam Constitutional:      General: She is not in acute distress.    Appearance: Normal appearance. She is not toxic-appearing.  HENT:     Head: Normocephalic and atraumatic.     Mouth/Throat:     Mouth: Mucous membranes are moist.     Pharynx: Oropharynx is clear. No oropharyngeal exudate or posterior oropharyngeal erythema.  Eyes:  General: No scleral icterus. Cardiovascular:     Rate and Rhythm: Normal rate and regular rhythm.     Pulses: Normal pulses.     Heart sounds: Normal heart sounds.  Pulmonary:     Effort: Pulmonary effort is normal.     Breath sounds: Normal breath sounds.  Abdominal:     General: Abdomen is flat. Bowel sounds are normal. There is no distension.     Palpations: Abdomen is soft.     Tenderness: There is no abdominal tenderness.  Musculoskeletal:        General: No swelling.     Cervical back: Neck supple.  Lymphadenopathy:     Cervical: No cervical adenopathy.  Skin:    General: Skin is warm and dry.     Findings: No rash.  Neurological:     General: No focal deficit present.     Mental Status: She is alert.  Psychiatric:        Mood and Affect: Mood normal.        Behavior: Behavior normal.     LABORATORY DATA:  CBC    Component Value Date/Time   WBC 7.1 09/21/2022 0908   RBC 4.57 09/21/2022 0908   HGB 12.4 09/21/2022 0908   HGB 10.1 (L) 03/13/2022 1210   HCT 37.2 09/21/2022 0908   PLT 190 09/21/2022 0908   PLT 196 03/13/2022 1210   MCV 81.4 09/21/2022 0908   MCH 27.1 09/21/2022 0908   MCHC 33.3 09/21/2022 0908   RDW 13.4 09/21/2022 0908   LYMPHSABS 1.0 09/21/2022 0908   MONOABS 0.4 09/21/2022 0908   EOSABS 0.1 09/21/2022 0908   BASOSABS  0.0 09/21/2022 0908    CMP     Component Value Date/Time   NA 139 09/21/2022 0908   K 4.4 09/21/2022 0908   CL 109 09/21/2022 0908   CO2 25 09/21/2022 0908   GLUCOSE 90 09/21/2022 0908   BUN 17 09/21/2022 0908   CREATININE 0.98 09/21/2022 0908   CREATININE 0.79 03/13/2022 1210   CALCIUM 8.6 (L) 09/21/2022 0908   PROT 6.5 09/21/2022 0908   ALBUMIN 4.0 09/21/2022 0908   AST 14 (L) 09/21/2022 0908   AST 18 03/13/2022 1210   ALT 7 09/21/2022 0908   ALT 12 03/13/2022 1210   ALKPHOS 49 09/21/2022 0908   BILITOT 0.6 09/21/2022 0908   BILITOT 0.4 03/13/2022 1210   GFRNONAA >60 09/21/2022 0908   GFRNONAA >60 03/13/2022 1210   GFRAA >60 06/04/2019 0201    ASSESSMENT and THERAPY PLAN:   Malignant neoplasm of upper-outer quadrant of left breast in female, estrogen receptor positive (HCC) Stephanie [name] is a 77 year old woman with recurrent left-sided stage Ia ER HER2 positive breast cancer diagnosed in October 2023.  She is status post neoadjuvant chemotherapy, lumpectomy, adjuvant radiation, and continues on maintenance Herceptin/Perjeta.    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  Most recent echo without any concerns.  She will be due for another echo in November.   Diarrhea: I recommended continue hydration and Imodium as needed. She is tolerating tamoxifen extremely well.  She is interested in [place] testing after completion of adjuvant Herceptin and Perjeta.  Stephanie [name] will return in 3 weeks for labs and her next infusion.  She can follow-up with [place] every 6 weeks. She knows to let us know if she has any questions or concerns that may arise between now and her next visit with [place].  All questions were answered. The patient knows to call the clinic with any problems, questions or concerns. We can certainly see the patient much sooner if necessary.  Total encounter time:20 minutes*in face-to-face visit time, chart  review, lab review, care coordination, order entry, and documentation of the encounter time.   *Total Encounter Time as defined by the Centers for Medicare and Medicaid Services includes, in addition to the face-to-face time of a patient visit (documented in the note above) non-face-to-face time: obtaining and reviewing outside history, ordering and reviewing medications, tests or procedures, care coordination (communications with other health care professionals or caregivers) and documentation in the medical record.

## 2022-09-21 NOTE — Assessment & Plan Note (Signed)
[name] is a 37 year old woman with recurrent left-sided stage Ia ER HER2 positive breast cancer diagnosed in October 2023.  She is status post neoadjuvant chemotherapy, lumpectomy, adjuvant radiation, and continues on maintenance Herceptin/Perjeta.    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  Most recent echo without any concerns.  She will be due for another echo in November.   Diarrhea: I recommended continue hydration and Imodium as needed. She is tolerating tamoxifen extremely well.  She is interested in [place] testing after completion of adjuvant Herceptin and Perjeta.  [name] will return in 3 weeks for labs and her next infusion.  She can follow-up with [place] every 6 weeks. She knows to let us know if she has any questions or concerns that may arise between now and her next visit with [place].

## 2022-09-21 NOTE — Patient Instructions (Signed)
[facility]  Discharge Instructions: Thank you for choosing [facility] to provide your oncology and hematology care.   If you have a lab appointment with the Cancer Center, please go directly to the Cancer Center and check in at the registration area.   Wear comfortable clothing and clothing appropriate for easy access to any Portacath or PICC line.   We strive to give you quality time with your provider. You may need to reschedule your appointment if you arrive late (15 or more minutes).  Arriving late affects you and other patients whose appointments are after yours.  Also, if you miss three or more appointments without notifying the office, you may be dismissed from the clinic at the provider's discretion.      For prescription refill requests, have your pharmacy contact our office and allow 72 hours for refills to be completed.    Today you received the following chemotherapy and/or immunotherapy agents: Trastuzumab, Pertuzumab.      To help prevent nausea and vomiting after your treatment, we encourage you to take your nausea medication as directed.  BELOW ARE SYMPTOMS THAT SHOULD BE REPORTED IMMEDIATELY: *FEVER GREATER THAN 100.4 F (38 C) OR HIGHER *CHILLS OR SWEATING *NAUSEA AND VOMITING THAT IS NOT CONTROLLED WITH YOUR NAUSEA MEDICATION *UNUSUAL SHORTNESS OF BREATH *UNUSUAL BRUISING OR BLEEDING *URINARY PROBLEMS (pain or burning when urinating, or frequent urination) *BOWEL PROBLEMS (unusual diarrhea, constipation, pain near the anus) TENDERNESS IN MOUTH AND THROAT WITH OR WITHOUT PRESENCE OF ULCERS (sore throat, sores in mouth, or a toothache) UNUSUAL RASH, SWELLING OR PAIN  UNUSUAL VAGINAL DISCHARGE OR ITCHING   Items with * indicate a potential emergency and should be followed up as soon as possible or go to the Emergency Department if any problems should occur.  Please show the CHEMOTHERAPY ALERT CARD or IMMUNOTHERAPY  ALERT CARD at check-in to the Emergency Department and triage nurse.  Should you have questions after your visit or need to cancel or reschedule your appointment, please contact [facility]  Dept: [phone]  and follow the prompts.  Office hours are 8:00 a.m. to 4:30 p.m. Monday - Friday. Please note that voicemails left after 4:00 p.m. may not be returned until the following business day.  We are closed weekends and major holidays. You have access to a nurse at all times for urgent questions. Please call the main number to the clinic Dept: [phone] and follow the prompts.   For any non-urgent questions, you may also contact your provider using MyChart. We now offer e-Visits for anyone 18 and older to request care online for non-urgent symptoms. For details visit [email or web address].   Also download the MyChart app! Go to the app store, search "MyChart", open the app, select [facility], and log in with your MyChart username and password.  

## 2022-09-21 NOTE — Progress Notes (Signed)
Pt declined 30 min post obs, discharged with VSS, ambulatory to lobby

## 2022-09-25 ENCOUNTER — Encounter: Payer: Self-pay | Admitting: Hematology and Oncology

## 2022-10-06 ENCOUNTER — Telehealth: Payer: Self-pay | Admitting: Hematology and Oncology

## 2022-10-07 ENCOUNTER — Telehealth: Payer: Self-pay | Admitting: Hematology and Oncology

## 2022-10-13 ENCOUNTER — Inpatient Hospital Stay: Payer: BC Managed Care – PPO | Attending: Hematology and Oncology

## 2022-10-13 ENCOUNTER — Other Ambulatory Visit: Payer: BC Managed Care – PPO

## 2022-10-13 ENCOUNTER — Ambulatory Visit: Payer: BC Managed Care – PPO

## 2022-10-13 ENCOUNTER — Ambulatory Visit: Payer: BC Managed Care – PPO | Admitting: Hematology and Oncology

## 2022-10-13 ENCOUNTER — Inpatient Hospital Stay: Payer: BC Managed Care – PPO

## 2022-10-13 ENCOUNTER — Encounter: Payer: Self-pay | Admitting: *Deleted

## 2022-10-13 VITALS — BP 106/65 | HR 65 | Temp 98.0°F | Resp 16 | Ht 66.0 in | Wt 152.5 lb

## 2022-10-13 DIAGNOSIS — Z79810 Long term (current) use of selective estrogen receptor modulators (SERMs): Secondary | ICD-10-CM | POA: Insufficient documentation

## 2022-10-13 DIAGNOSIS — Z170 Estrogen receptor positive status [ER+]: Secondary | ICD-10-CM

## 2022-10-13 DIAGNOSIS — Z923 Personal history of irradiation: Secondary | ICD-10-CM | POA: Insufficient documentation

## 2022-10-13 DIAGNOSIS — C50412 Malignant neoplasm of upper-outer quadrant of left female breast: Secondary | ICD-10-CM | POA: Diagnosis present

## 2022-10-13 DIAGNOSIS — Z5112 Encounter for antineoplastic immunotherapy: Secondary | ICD-10-CM | POA: Diagnosis present

## 2022-10-13 DIAGNOSIS — Z95828 Presence of other vascular implants and grafts: Secondary | ICD-10-CM

## 2022-10-13 LAB — CBC WITH DIFFERENTIAL/PLATELET
Abs Immature Granulocytes: 0.03 10*3/uL (ref 0.00–0.07)
Basophils Absolute: 0 10*3/uL (ref 0.0–0.1)
Basophils Relative: 1 %
Eosinophils Absolute: 0.1 10*3/uL (ref 0.0–0.5)
Eosinophils Relative: 1 %
HCT: 39.1 % (ref 36.0–46.0)
Hemoglobin: 12.7 g/dL (ref 12.0–15.0)
Immature Granulocytes: 0 %
Lymphocytes Relative: 19 %
Lymphs Abs: 1.4 10*3/uL (ref 0.7–4.0)
MCH: 26.4 pg (ref 26.0–34.0)
MCHC: 32.5 g/dL (ref 30.0–36.0)
MCV: 81.3 fL (ref 80.0–100.0)
Monocytes Absolute: 0.4 10*3/uL (ref 0.1–1.0)
Monocytes Relative: 6 %
Neutro Abs: 5.2 10*3/uL (ref 1.7–7.7)
Neutrophils Relative %: 73 %
Platelets: 180 10*3/uL (ref 150–400)
RBC: 4.81 MIL/uL (ref 3.87–5.11)
RDW: 13.9 % (ref 11.5–15.5)
WBC: 7.2 10*3/uL (ref 4.0–10.5)
nRBC: 0 % (ref 0.0–0.2)

## 2022-10-13 LAB — COMPREHENSIVE METABOLIC PANEL
ALT: 11 U/L (ref 0–44)
AST: 17 U/L (ref 15–41)
Albumin: 3.9 g/dL (ref 3.5–5.0)
Alkaline Phosphatase: 39 U/L (ref 38–126)
Anion gap: 5 (ref 5–15)
BUN: 20 mg/dL (ref 6–20)
CO2: 24 mmol/L (ref 22–32)
Calcium: 8.7 mg/dL — ABNORMAL LOW (ref 8.9–10.3)
Chloride: 109 mmol/L (ref 98–111)
Creatinine, Ser: 0.99 mg/dL (ref 0.44–1.00)
GFR, Estimated: >60 mL/min (ref >60)
Glucose, Bld: 99 mg/dL (ref 70–99)
Potassium: 4.1 mmol/L (ref 3.5–5.1)
Sodium: 138 mmol/L (ref 135–145)
Total Bilirubin: 0.4 mg/dL (ref 0.3–1.2)
Total Protein: 6.2 g/dL — ABNORMAL LOW (ref 6.5–8.1)

## 2022-10-13 MED ORDER — HEPARIN SOD (PORK) LOCK FLUSH 100 UNIT/ML IV SOLN
500.0000 [IU] | Freq: Once | INTRAVENOUS | Status: AC | PRN
  Administered 2022-10-13: 500 [IU]

## 2022-10-13 MED ORDER — SODIUM CHLORIDE 0.9% FLUSH
10.0000 mL | INTRAVENOUS | Status: DC | PRN
  Administered 2022-10-13: 10 mL

## 2022-10-13 MED ORDER — ACETAMINOPHEN 325 MG PO TABS
650.0000 mg | ORAL_TABLET | Freq: Once | ORAL | Status: AC
  Administered 2022-10-13: 650 mg via ORAL
  Filled 2022-10-13: qty 2

## 2022-10-13 MED ORDER — TRASTUZUMAB-ANNS CHEMO 150 MG IV SOLR
6.0000 mg/kg | Freq: Once | INTRAVENOUS | Status: AC
  Administered 2022-10-13: 420 mg via INTRAVENOUS
  Filled 2022-10-13: qty 20

## 2022-10-13 MED ORDER — SODIUM CHLORIDE 0.9% FLUSH
10.0000 mL | Freq: Once | INTRAVENOUS | Status: AC
  Administered 2022-10-13: 10 mL

## 2022-10-13 MED ORDER — SODIUM CHLORIDE 0.9 % IV SOLN: Freq: Once | INTRAVENOUS | Status: AC

## 2022-10-13 MED ORDER — SODIUM CHLORIDE 0.9 % IV SOLN
420.0000 mg | Freq: Once | INTRAVENOUS | Status: AC
  Administered 2022-10-13: 420 mg via INTRAVENOUS
  Filled 2022-10-13: qty 14

## 2022-10-13 MED ORDER — DIPHENHYDRAMINE HCL 25 MG PO CAPS
50.0000 mg | ORAL_CAPSULE | Freq: Once | ORAL | Status: AC
  Administered 2022-10-13: 50 mg via ORAL
  Filled 2022-10-13: qty 2

## 2022-10-13 NOTE — Progress Notes (Signed)
Pt. refused to stay for 30 minute post observation. Vital signs stable, left via ambulation, and no respiratory distress noted.

## 2022-10-13 NOTE — Patient Instructions (Signed)
 [place] [facility]  Discharge Instructions: Thank you for choosing [facility] to provide your oncology and hematology care.   If you have a lab appointment with the Cancer Center, please go directly to the Cancer Center and check in at the registration area.   Wear comfortable clothing and clothing appropriate for easy access to any Portacath or PICC line.   We strive to give you quality time with your provider. You may need to reschedule your appointment if you arrive late (15 or more minutes).  Arriving late affects you and other patients whose appointments are after yours.  Also, if you miss three or more appointments without notifying the office, you may be dismissed from the clinic at the provider's discretion.      For prescription refill requests, have your pharmacy contact our office and allow 72 hours for refills to be completed.    Today you received the following chemotherapy and/or immunotherapy agents: Trastuzumab (Kanjinti) and Pertuzumab (Perjeta)   To help prevent nausea and vomiting after your treatment, we encourage you to take your nausea medication as directed.  BELOW ARE SYMPTOMS THAT SHOULD BE REPORTED IMMEDIATELY: *FEVER GREATER THAN 100.4 F (38 C) OR HIGHER *CHILLS OR SWEATING *NAUSEA AND VOMITING THAT IS NOT CONTROLLED WITH YOUR NAUSEA MEDICATION *UNUSUAL SHORTNESS OF BREATH *UNUSUAL BRUISING OR BLEEDING *URINARY PROBLEMS (pain or burning when urinating, or frequent urination) *BOWEL PROBLEMS (unusual diarrhea, constipation, pain near the anus) TENDERNESS IN MOUTH AND THROAT WITH OR WITHOUT PRESENCE OF ULCERS (sore throat, sores in mouth, or a toothache) UNUSUAL RASH, SWELLING OR PAIN  UNUSUAL VAGINAL DISCHARGE OR ITCHING   Items with * indicate a potential emergency and should be followed up as soon as possible or go to the Emergency Department if any problems should occur.  Please show the CHEMOTHERAPY ALERT CARD  or IMMUNOTHERAPY ALERT CARD at check-in to the Emergency Department and triage nurse.  Should you have questions after your visit or need to cancel or reschedule your appointment, please contact [place] [facility]  Dept: [phone]  and follow the prompts.  Office hours are 8:00 a.m. to 4:30 p.m. Monday - Friday. Please note that voicemails left after 4:00 p.m. may not be returned until the following business day.  We are closed weekends and major holidays. You have access to a nurse at all times for urgent questions. Please call the main number to the clinic Dept: [phone] and follow the prompts.   For any non-urgent questions, you may also contact your provider using MyChart. We now offer e-Visits for anyone 104 and older to request care online for non-urgent symptoms. For details visit [email or web address].   Also download the MyChart app! Go to the app store, search "MyChart", open the app, select [place], and log in with your MyChart username and password.

## 2022-10-14 ENCOUNTER — Other Ambulatory Visit: Payer: Self-pay

## 2022-11-06 ENCOUNTER — Encounter: Payer: Self-pay | Admitting: Hematology and Oncology

## 2022-11-06 ENCOUNTER — Other Ambulatory Visit: Payer: Self-pay

## 2022-11-06 ENCOUNTER — Inpatient Hospital Stay: Payer: BC Managed Care – PPO

## 2022-11-06 ENCOUNTER — Inpatient Hospital Stay: Payer: BC Managed Care – PPO | Admitting: Hematology and Oncology

## 2022-11-06 VITALS — BP 111/65 | HR 67 | Temp 97.9°F | Resp 18

## 2022-11-06 VITALS — BP 107/63 | HR 53 | Temp 97.2°F | Resp 18 | Ht 66.0 in | Wt 158.3 lb

## 2022-11-06 DIAGNOSIS — Z170 Estrogen receptor positive status [ER+]: Secondary | ICD-10-CM

## 2022-11-06 DIAGNOSIS — Z79810 Long term (current) use of selective estrogen receptor modulators (SERMs): Secondary | ICD-10-CM

## 2022-11-06 DIAGNOSIS — Z923 Personal history of irradiation: Secondary | ICD-10-CM | POA: Diagnosis not present

## 2022-11-06 DIAGNOSIS — C50412 Malignant neoplasm of upper-outer quadrant of left female breast: Secondary | ICD-10-CM | POA: Diagnosis not present

## 2022-11-06 LAB — CBC WITH DIFFERENTIAL/PLATELET
Abs Immature Granulocytes: 0.01 10*3/uL (ref 0.00–0.07)
Basophils Absolute: 0 10*3/uL (ref 0.0–0.1)
Basophils Relative: 1 %
Eosinophils Absolute: 0.1 10*3/uL (ref 0.0–0.5)
Eosinophils Relative: 1 %
HCT: 36.7 % (ref 36.0–46.0)
Hemoglobin: 11.9 g/dL — ABNORMAL LOW (ref 12.0–15.0)
Immature Granulocytes: 0 %
Lymphocytes Relative: 16 %
Lymphs Abs: 1.1 10*3/uL (ref 0.7–4.0)
MCH: 26.7 pg (ref 26.0–34.0)
MCHC: 32.4 g/dL (ref 30.0–36.0)
MCV: 82.5 fL (ref 80.0–100.0)
Monocytes Absolute: 0.4 10*3/uL (ref 0.1–1.0)
Monocytes Relative: 5 %
Neutro Abs: 5.4 10*3/uL (ref 1.7–7.7)
Neutrophils Relative %: 77 %
Platelets: 166 10*3/uL (ref 150–400)
RBC: 4.45 MIL/uL (ref 3.87–5.11)
RDW: 13.8 % (ref 11.5–15.5)
WBC: 7 10*3/uL (ref 4.0–10.5)
nRBC: 0 % (ref 0.0–0.2)

## 2022-11-06 LAB — COMPREHENSIVE METABOLIC PANEL
ALT: 9 U/L (ref 0–44)
AST: 15 U/L (ref 15–41)
Albumin: 3.6 g/dL (ref 3.5–5.0)
Alkaline Phosphatase: 42 U/L (ref 38–126)
Anion gap: 4 — ABNORMAL LOW (ref 5–15)
BUN: 16 mg/dL (ref 6–20)
CO2: 27 mmol/L (ref 22–32)
Calcium: 8.6 mg/dL — ABNORMAL LOW (ref 8.9–10.3)
Chloride: 110 mmol/L (ref 98–111)
Creatinine, Ser: 0.84 mg/dL (ref 0.44–1.00)
GFR, Estimated: >60 mL/min (ref >60)
Glucose, Bld: 100 mg/dL — ABNORMAL HIGH (ref 70–99)
Potassium: 3.9 mmol/L (ref 3.5–5.1)
Sodium: 141 mmol/L (ref 135–145)
Total Bilirubin: 0.3 mg/dL (ref 0.3–1.2)
Total Protein: 5.9 g/dL — ABNORMAL LOW (ref 6.5–8.1)

## 2022-11-06 MED ORDER — DIPHENHYDRAMINE HCL 25 MG PO CAPS
50.0000 mg | ORAL_CAPSULE | Freq: Once | ORAL | Status: AC
  Administered 2022-11-06: 50 mg via ORAL
  Filled 2022-11-06: qty 2

## 2022-11-06 MED ORDER — SODIUM CHLORIDE 0.9 % IV SOLN: Freq: Once | INTRAVENOUS | Status: AC

## 2022-11-06 MED ORDER — SODIUM CHLORIDE 0.9% FLUSH
10.0000 mL | INTRAVENOUS | Status: DC | PRN
Start: 2022-11-06 — End: 2022-11-06
  Administered 2022-11-06: 10 mL

## 2022-11-06 MED ORDER — ACETAMINOPHEN 325 MG PO TABS
650.0000 mg | ORAL_TABLET | Freq: Once | ORAL | Status: AC
  Administered 2022-11-06: 650 mg via ORAL
  Filled 2022-11-06: qty 2

## 2022-11-06 MED ORDER — TRASTUZUMAB-ANNS CHEMO 150 MG IV SOLR
6.0000 mg/kg | Freq: Once | INTRAVENOUS | Status: AC
  Administered 2022-11-06: 420 mg via INTRAVENOUS
  Filled 2022-11-06: qty 20

## 2022-11-06 MED ORDER — HEPARIN SOD (PORK) LOCK FLUSH 100 UNIT/ML IV SOLN
500.0000 [IU] | Freq: Once | INTRAVENOUS | Status: AC | PRN
  Administered 2022-11-06: 500 [IU]

## 2022-11-06 MED ORDER — PERTUZUMAB CHEMO INJECTION 420 MG/14ML
420.0000 mg | Freq: Once | INTRAVENOUS | Status: AC
  Administered 2022-11-06: 420 mg via INTRAVENOUS
  Filled 2022-11-06: qty 14

## 2022-11-06 NOTE — Patient Instructions (Signed)
 [place] [facility]  Discharge Instructions: Thank you for choosing [facility] to provide your oncology and hematology care.   If you have a lab appointment with the Cancer Center, please go directly to the Cancer Center and check in at the registration area.   Wear comfortable clothing and clothing appropriate for easy access to any Portacath or PICC line.   We strive to give you quality time with your provider. You may need to reschedule your appointment if you arrive late (15 or more minutes).  Arriving late affects you and other patients whose appointments are after yours.  Also, if you miss three or more appointments without notifying the office, you may be dismissed from the clinic at the provider's discretion.      For prescription refill requests, have your pharmacy contact our office and allow 72 hours for refills to be completed.    Today you received the following chemotherapy and/or immunotherapy agents: Trastuzumab (Kanjinti) and Pertuzumab (Perjeta)   To help prevent nausea and vomiting after your treatment, we encourage you to take your nausea medication as directed.  BELOW ARE SYMPTOMS THAT SHOULD BE REPORTED IMMEDIATELY: *FEVER GREATER THAN 100.4 F (38 C) OR HIGHER *CHILLS OR SWEATING *NAUSEA AND VOMITING THAT IS NOT CONTROLLED WITH YOUR NAUSEA MEDICATION *UNUSUAL SHORTNESS OF BREATH *UNUSUAL BRUISING OR BLEEDING *URINARY PROBLEMS (pain or burning when urinating, or frequent urination) *BOWEL PROBLEMS (unusual diarrhea, constipation, pain near the anus) TENDERNESS IN MOUTH AND THROAT WITH OR WITHOUT PRESENCE OF ULCERS (sore throat, sores in mouth, or a toothache) UNUSUAL RASH, SWELLING OR PAIN  UNUSUAL VAGINAL DISCHARGE OR ITCHING   Items with * indicate a potential emergency and should be followed up as soon as possible or go to the Emergency Department if any problems should occur.  Please show the CHEMOTHERAPY ALERT CARD  or IMMUNOTHERAPY ALERT CARD at check-in to the Emergency Department and triage nurse.  Should you have questions after your visit or need to cancel or reschedule your appointment, please contact [place] [facility]  Dept: [phone]  and follow the prompts.  Office hours are 8:00 a.m. to 4:30 p.m. Monday - Friday. Please note that voicemails left after 4:00 p.m. may not be returned until the following business day.  We are closed weekends and major holidays. You have access to a nurse at all times for urgent questions. Please call the main number to the clinic Dept: [phone] and follow the prompts.   For any non-urgent questions, you may also contact your provider using MyChart. We now offer e-Visits for anyone 104 and older to request care online for non-urgent symptoms. For details visit [email or web address].   Also download the MyChart app! Go to the app store, search "MyChart", open the app, select [place], and log in with your MyChart username and password.

## 2022-11-06 NOTE — Progress Notes (Signed)
[facility] Cancer Follow up:    Stephanie Pandy, MD    DIAGNOSIS:  Cancer Staging  Malignant neoplasm of upper-outer quadrant of left breast in female, estrogen receptor positive (HCC) Staging form: Breast, AJCC 8th Edition - Clinical: Stage IA (cT1c, cN0, cM0, G2, ER+, PR-, HER2+) - Signed by Rachel Moulds, MD on 11/18/2021 Histologic grading system: 3 grade system   SUMMARY OF ONCOLOGIC HISTORY: Oncology History  Malignant neoplasm of upper-outer quadrant of female breast (HCC) (Resolved)  10/20/2018 Initial Diagnosis   Malignant neoplasm of upper-outer quadrant of left breast in female, estrogen receptor positive (HCC)   10/20/2018 Cancer Staging   Staging form: Breast, AJCC 8th Edition - Clinical stage from 10/20/2018: Stage 0 (cTis (DCIS), cN0, cM0, ER+, PR-) - Signed by Lowella Dell, MD on 02/13/2020 Stage prefix: Initial diagnosis   11/03/2018 Genetic Testing   Negative genetic testing:  No pathogenic variants detected on the [facility] Multi-Cancer panel, ordered by Dr. Juliene Pina at [facility]. The report date is 11/03/2018.  The Multi-Cancer Panel offered by [facility] includes sequencing and/or deletion duplication testing of the following 84 genes: AIP, ALK, APC, ATM, AXIN2,BAP1,  BARD1, BLM, BMPR1A, BRCA1, BRCA2, BRIP1, CASR, CDC73, CDH1, CDK4, CDKN1B, CDKN1C, CDKN2A (p14ARF), CDKN2A (p16INK4a), CEBPA, CHEK2, CTNNA1, DICER1, DIS3L2, EGFR (c.2369C>T, p.Thr790Met variant only), EPCAM (Deletion/duplication testing only), FH, FLCN, GATA2, GPC3, GREM1 (Promoter region deletion/duplication testing only), HOXB13 (c.251G>A, p.Gly84Glu), HRAS, KIT, MAX, MEN1, MET, MITF (c.952G>A, p.Glu318Lys variant only), MLH1, MSH2, MSH3, MSH6, MUTYH, NBN, NF1, NF2, NTHL1, PALB2, PDGFRA, PHOX2B, PMS2, POLD1, POLE, POT1, PRKAR1A, PTCH1, PTEN, RAD50, RAD51C, RAD51D, RB1, RECQL4, RET, RUNX1, SDHAF2, SDHA (sequence changes only), SDHB, SDHC, SDHD, SMAD4, SMARCA4, SMARCB1, SMARCE1,  STK11, SUFU, TERC, TERT, TMEM127, TP53, TSC1, TSC2, VHL, WRN and WT1.     01/17/2019 Cancer Staging   Staging form: Breast, AJCC 8th Edition - Pathologic stage from 01/17/2019: Stage IA (pT22mi, pN0, cM0, G2, ER+, PR-, HER2-) - Signed by Loa Socks, NP on 02/01/2019   Malignant neoplasm of upper-outer quadrant of left breast in female, estrogen receptor positive (HCC)  10/21/2021 Breast US   Breast ultrasound of the palpable mass showed hypoechoic oval vascular mass with indistinct margins measuring 1.5 x 1 cm x 1.4 cm.  No abnormal lymph nodes found in the left axilla.  She had ultrasound-guided biopsy.  MRI scheduled for tomorrow.   10/29/2021 Pathology Results   Surgical pathology from the left breast needle core biopsy at 130 o'clock showed invasive ductal carcinoma, overall grade 2, prognostics ER 70% positive weak to moderate staining, PR 0% negative, Ki-67 of 80% and HER2 3+   11/18/2021 Cancer Staging   Staging form: Breast, AJCC 8th Edition - Clinical: Stage IA (cT1c, cN0, cM0, G2, ER+, PR-, HER2+) - Signed by Rachel Moulds, MD on 11/18/2021 Histologic grading system: 3 grade system   11/26/2021 - 03/16/2022 Chemotherapy   Patient is on Treatment Plan : BREAST  Docetaxel + Carboplatin + Trastuzumab + Pertuzumab  (TCHP) q21d      04/03/2022 -  Chemotherapy   Patient is on Treatment Plan : BREAST Trastuzumab + Pertuzumab q21d x 11 cycles     04/30/2022 Surgery   Left breast lumpectomy: no residual cancer identified, margins negative, treatment effect present, 1SLN negative for cancer.   06/24/2022 - 08/03/2022 Radiation Therapy   Adjuvant radiation therapy     CURRENT THERAPY: Herceptin/Perjeta; Tamoxifen  INTERVAL HISTORY:  Stephanie [name] 37 y.o. female returns for follow-up prior to  receiving every 3-week Herceptin Perjeta.  She is tolerating this well. Ms. Banuelos is excited about completing the Herceptin and Perjeta, last dose scheduled for early November.  She is still  on her sabbatical and recovering.  She however tells me that she is very worried about the tamoxifen side effects.  She reported several including emotional lability, mental fog, hair thinning, vaginal dryness, leg cramps, muscle cramps, severe fatigue, frequent urination.  She is very emotional and worried about the risk of recurrence.  She had several questions regarding the role of Oncotype DX and breast cancer index.  She is wondering if there is any way we can calculate her risk of recurrence with and without tamoxifen in this case.  She is hoping she does not have to take tamoxifen.  She is interested in pursuing circulating tumor cell testing.  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  MEDICAL HISTORY: Past Medical History:  Diagnosis Date   Atypical mole 03/14/2019   mod-left side superior   BRCA gene mutation negative in female    Breast cancer [facility])    Family history of breast cancer    Family history of lung cancer    Family history of rectal cancer    Melanoma (HCC) 10/14/2007   level II- right arm- (EXC)   PONV (postoperative nausea and vomiting)     SURGICAL HISTORY: Past Surgical History:  Procedure Laterality Date   BREAST BIOPSY Left 04/29/2022   [place] LT RADIOACTIVE SEED LOC 04/29/2022 [facility] MAMMOGRAPHY   BREAST LUMPECTOMY WITH RADIOACTIVE SEED AND SENTINEL LYMPH NODE BIOPSY Left 04/30/2022   Procedure: LEFT BREAST LUMPECTOMY WITH RADIOACTIVE  SEED AND SENTINEL LYMPH NODE BIOPSY;  Surgeon: [name], MD;  Location: [facility];  Service: General;  Laterality: Left;   BREAST RECONSTRUCTION WITH PLACEMENT OF TISSUE EXPANDER AND ALLODERM Bilateral 01/17/2019   Procedure: BILATERAL BREAST RECONSTRUCTION WITH PLACEMENT OF TISSUE EXPANDER AND ALLODERM;  Surgeon: Glenna Fellows, MD;  Location: [facility];  Service: Plastics;  Laterality: Bilateral;   IR IMAGING GUIDED PORT INSERTION  11/25/2021   IR RADIOLOGIST EVAL & MGMT  11/26/2021   LESION REMOVAL Left 06/02/2019   Procedure: MINOR EXICISION OF LESION,LAYERED CLOSURE 1 CM;  Surgeon: Glenna Fellows, MD;  Location: [facility];  Service: Plastics;  Laterality: Left;   LIPOSUCTION WITH LIPOFILLING Bilateral 06/02/2019   Procedure: LIPOFILLING FROM ABDOMEN TO BILATERAL CHEST;  Surgeon: Glenna Fellows, MD;  Location: [facility];  Service: Plastics;  Laterality: Bilateral;   melanoma surgery Right 2009   upper right arm   NIPPLE SPARING MASTECTOMY WITH SENTINEL LYMPH NODE BIOPSY Bilateral 01/17/2019   Procedure: BILATERAL NIPPLE SPARING MASTECTOMIES WITH LEFT SENTINEL LYMPH NODE MAPPING;  Surgeon: [name], MD;  Location: [facility];  Service: General;  Laterality: Bilateral;   REMOVAL OF BILATERAL TISSUE EXPANDERS WITH PLACEMENT OF BILATERAL BREAST IMPLANTS Bilateral 06/02/2019  Procedure: REMOVAL OF BILATERAL TISSUE EXPANDERS WITH PLACEMENT OF BILATERAL SILICONE BREAST IMPLANTS;  Surgeon: Glenna Fellows, MD;  Location: [facility];  Service: Plastics;  Laterality: Bilateral;   WISDOM TOOTH EXTRACTION      SOCIAL HISTORY: Social History   Socioeconomic History   Marital status: Married    Spouse name: Not on file   Number of children: Not on file   Years of education: Not on file   Highest education level: Not on file  Occupational History   Not on file  Tobacco Use   Smoking  status: Never   Smokeless tobacco: Never  Vaping Use   Vaping status: Never Used  Substance and Sexual Activity   Alcohol use: Yes    Comment: occasional   Drug use: Never   Sexual activity: Yes    Birth control/protection: None  Other Topics Concern   Not on file  Social History Narrative   Not on file   Social Determinants of Health   Financial Resource Strain: Not on file  Food Insecurity: No Food Insecurity (05/25/2022)   Hunger Vital Sign    Worried About Running Out of Food in the Last Year: Never true    Ran Out of Food in the Last Year: Never true  Transportation Needs: No Transportation Needs (05/25/2022)   PRAPARE - Administrator, Civil Service (Medical): No    Lack of Transportation (Non-Medical): No  Physical Activity: Not on file  Stress: Not on file  Social Connections: Not on file  Intimate Partner Violence: Not At Risk (05/25/2022)   Humiliation, Afraid, Rape, and Kick questionnaire    Fear of Current or Ex-Partner: No    Emotionally Abused: No    Physically Abused: No    Sexually Abused: No    FAMILY HISTORY: Family History  Problem Relation Age of Onset   Hypertension Mother    Heart disease Father    Breast cancer Maternal Aunt 39 - 69   Breast cancer Maternal Aunt 74 - 59   Lung cancer Paternal Aunt        diagnosed late 31s   Breast cancer Maternal Grandmother 1 - 69   Breast cancer Paternal Grandmother 23 - 34   Lung cancer Paternal Grandmother        thought to be breast cancer mets   Rectal cancer Paternal Grandmother        diagnosed 92s   Lung cancer Paternal Grandfather        diagnosed 23s       PHYSICAL EXAMINATION   Vitals:   11/06/22 0944  BP: 107/63  Pulse: (!) 53  Resp: 18  Temp: (!) 97.2 F (36.2 C)  SpO2: 100%   Physical Exam Constitutional:      General: She is not in acute distress.    Appearance: Normal appearance. She is not toxic-appearing.  HENT:     Head: Normocephalic and atraumatic.      Mouth/Throat:     Mouth: Mucous membranes are moist.     Pharynx: Oropharynx is clear. No oropharyngeal exudate or posterior oropharyngeal erythema.  Eyes:     General: No scleral icterus. Cardiovascular:     Rate and Rhythm: Normal rate and regular rhythm.     Pulses: Normal pulses.     Heart sounds: Normal heart sounds.  Pulmonary:     Effort: Pulmonary effort is normal.     Breath sounds: Normal breath sounds.  Chest:     Comments:  Bilateral mastectomy status post reconstruction with implants.  No concern for recurrence. Abdominal:     General: Abdomen is flat. Bowel sounds are normal. There is no distension.     Palpations: Abdomen is soft.     Tenderness: There is no abdominal tenderness.  Musculoskeletal:        General: No swelling.     Cervical back: Neck supple.  Lymphadenopathy:     Cervical: No cervical adenopathy.  Skin:    General: Skin is warm and dry.     Findings: No rash.  Neurological:     General: No focal deficit present.     Mental Status: She is alert.  Psychiatric:        Mood and Affect: Mood normal.        Behavior: Behavior normal.     LABORATORY DATA:  CBC    Component Value Date/Time   WBC 7.0 11/06/2022 0923   RBC 4.45 11/06/2022 0923   HGB 11.9 (L) 11/06/2022 0923   HGB 10.1 (L) 03/13/2022 1210   HCT 36.7 11/06/2022 0923   PLT 166 11/06/2022 0923   PLT 196 03/13/2022 1210   MCV 82.5 11/06/2022 0923   MCH 26.7 11/06/2022 0923   MCHC 32.4 11/06/2022 0923   RDW 13.8 11/06/2022 0923   LYMPHSABS 1.1 11/06/2022 0923   MONOABS 0.4 11/06/2022 0923   EOSABS 0.1 11/06/2022 0923   BASOSABS 0.0 11/06/2022 0923    CMP     Component Value Date/Time   NA 141 11/06/2022 0923   K 3.9 11/06/2022 0923   CL 110 11/06/2022 0923   CO2 27 11/06/2022 0923   GLUCOSE 100 (H) 11/06/2022 0923   BUN 16 11/06/2022 0923   CREATININE 0.84 11/06/2022 0923   CREATININE 0.79 03/13/2022 1210   CALCIUM 8.6 (L) 11/06/2022 0923   PROT 5.9 (L) 11/06/2022  0923   ALBUMIN 3.6 11/06/2022 0923   AST 15 11/06/2022 0923   AST 18 03/13/2022 1210   ALT 9 11/06/2022 0923   ALT 12 03/13/2022 1210   ALKPHOS 42 11/06/2022 0923   BILITOT 0.3 11/06/2022 0923   BILITOT 0.4 03/13/2022 1210   GFRNONAA >60 11/06/2022 0923   GFRNONAA >60 03/13/2022 1210   GFRAA >60 06/04/2019 0201    ASSESSMENT and THERAPY PLAN:   Malignant neoplasm of upper-outer quadrant of left breast in female, estrogen receptor positive (HCC) Stephanie [name] is a 35 year old woman with recurrent left-sided stage Ia ER HER2 positive breast cancer diagnosed in October 2023.  She is status post neoadjuvant chemotherapy, lumpectomy, adjuvant radiation, and continues on maintenance Herceptin/Perjeta.    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  Most recent echo without any concerns.  She will be due for another echo in November.  This has been ordered Role of antiestrogen therapy, she has been having tremendous difficulty with tamoxifen including vaginal dryness, frequent urination, mental fog, hair thinning, emotional lability and she does not feel well about all the side effects.  She was tearful today.  She was hoping we can come up with some calculations to discuss the risk of recurrence with and without tamoxifen.  We have reviewed that she had recurrent breast cancer and HER2 amplified breast cancer which is likely the primary driver but her original pathology showed ER 70% weak to moderate staining hence it is indicated to consider antiestrogen therapy.  I have recommended that she continue tamoxifen for a couple more months if  she can tolerate it, typically side effects could get milder.  Also I believe once she resumes her menstrual cycles and her estrogen levels return to premenopausal status, she might experience less of these vaginal side effects that she is experiencing currently.  She inquired into the role of Oncotype  DX of breast cancer index.  These are not indicated and HER2 amplified breast cancer.  Breast cancer index is used to determine who benefits from extended antiestrogen therapy and has no clear role at this point.   She is interested in considering circulating tumor cell testing, gave her information about guardant reveal and Signatera on her last appointment.  Will reach out to guardant to initiate this process She wants to consider MRI bilateral breast with implants to proceed with surveillance I think this is certainly very reasonable. She will continue with the anti-HER2 therapy, tamoxifen for now and return to clinic for her follow-up in November as scheduled.    All questions were answered. The patient knows to call the clinic with any problems, questions or concerns. We can certainly see the patient much sooner if necessary.  Total encounter time:40 minutes*in face-to-face visit time, chart review, lab review, care coordination, order entry, and documentation of the encounter time.   *Total Encounter Time as defined by the Centers for Medicare and Medicaid Services includes, in addition to the face-to-face time of a patient visit (documented in the note above) non-face-to-face time: obtaining and reviewing outside history, ordering and reviewing medications, tests or procedures, care coordination (communications with other health care professionals or caregivers) and documentation in the medical record.

## 2022-11-06 NOTE — Assessment & Plan Note (Signed)
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  She is status post neoadjuvant chemotherapy, lumpectomy, adjuvant radiation, and continues on maintenance Herceptin/Perjeta.    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  Most recent echo without any concerns.  She will be due for another echo in November.  This has been ordered Role of antiestrogen therapy, she has been having tremendous difficulty with tamoxifen including vaginal dryness, frequent urination, mental fog, hair thinning, emotional lability and she does not feel well about all the side effects.  She was tearful today.  She was hoping we can come up with some calculations to discuss the risk of recurrence with and without tamoxifen.  We have reviewed that she had recurrent breast cancer and HER2 amplified breast cancer which is likely the primary driver but her original pathology showed ER 70% weak to moderate staining hence it is indicated to consider antiestrogen therapy.  I have recommended that she continue tamoxifen for a couple more months if she can tolerate it, typically side effects could get milder.  Also I believe once she resumes her menstrual cycles and her estrogen levels return to premenopausal status, she might experience less of these vaginal side effects that she is experiencing currently.  She inquired into the role of Oncotype DX of breast cancer index.  These are not indicated and HER2 amplified breast cancer.  Breast cancer index is used to determine who benefits from extended antiestrogen therapy and has no clear role at this point.   She is interested in considering circulating tumor cell testing, gave her information about guardant reveal and Signatera on her last appointment.  Will reach out to guardant to initiate this process She wants to consider MRI bilateral breast with  implants to proceed with surveillance I think this is certainly very reasonable. She will continue with the anti-HER2 therapy, tamoxifen for now and return to clinic for her follow-up in November as scheduled.

## 2022-11-09 ENCOUNTER — Ambulatory Visit: Payer: BC Managed Care – PPO | Attending: Hematology and Oncology

## 2022-11-09 VITALS — Wt 155.4 lb

## 2022-11-09 DIAGNOSIS — Z483 Aftercare following surgery for neoplasm: Secondary | ICD-10-CM | POA: Insufficient documentation

## 2022-11-09 NOTE — Therapy (Signed)
OUTPATIENT PHYSICAL THERAPY SOZO SCREENING NOTE   Patient Name: Stephanie [name] MRN: [id number] DOB:1985-03-02, 37 y.o., female Today's Date: 11/09/2022  PCP: Estanislado Pandy, MD REFERRING PROVIDER: Rachel Moulds, MD    Past Medical History:  Diagnosis Date   Atypical mole 03/14/2019   mod-left side superior   BRCA gene mutation negative in female    Breast cancer [facility])    Family history of breast cancer    Family history of lung cancer    Family history of rectal cancer    Melanoma (HCC) 10/14/2007   level II- right arm- (EXC)   PONV (postoperative nausea and vomiting)    Past Surgical History:  Procedure Laterality Date   BREAST BIOPSY Left 04/29/2022   [place] LT RADIOACTIVE SEED LOC 04/29/2022 [facility] MAMMOGRAPHY   BREAST LUMPECTOMY WITH RADIOACTIVE SEED AND SENTINEL LYMPH NODE BIOPSY Left 04/30/2022   Procedure: LEFT BREAST LUMPECTOMY WITH RADIOACTIVE SEED AND SENTINEL LYMPH NODE BIOPSY;  Surgeon: [name], MD;  Location: [facility];  Service: General;  Laterality: Left;   BREAST RECONSTRUCTION WITH PLACEMENT OF TISSUE EXPANDER AND ALLODERM Bilateral 01/17/2019   Procedure: BILATERAL BREAST RECONSTRUCTION WITH PLACEMENT OF TISSUE EXPANDER AND ALLODERM;  Surgeon: Glenna Fellows, MD;  Location: [facility];  Service: Plastics;  Laterality: Bilateral;   IR IMAGING GUIDED PORT INSERTION  11/25/2021   IR RADIOLOGIST EVAL & MGMT  11/26/2021   LESION REMOVAL Left 06/02/2019   Procedure: MINOR EXICISION OF LESION,LAYERED CLOSURE 1 CM;  Surgeon: Glenna Fellows, MD;  Location: [facility];  Service: Plastics;  Laterality: Left;   LIPOSUCTION WITH LIPOFILLING Bilateral 06/02/2019   Procedure: LIPOFILLING FROM ABDOMEN TO BILATERAL CHEST;  Surgeon: Glenna Fellows, MD;  Location: [facility];  Service: Plastics;  Laterality: Bilateral;   melanoma surgery Right 2009   upper right arm   NIPPLE SPARING MASTECTOMY WITH SENTINEL  LYMPH NODE BIOPSY Bilateral 01/17/2019   Procedure: BILATERAL NIPPLE SPARING MASTECTOMIES WITH LEFT SENTINEL LYMPH NODE MAPPING;  Surgeon: [name], MD;  Location: [facility];  Service: General;  Laterality: Bilateral;   REMOVAL OF BILATERAL TISSUE EXPANDERS WITH PLACEMENT OF BILATERAL BREAST IMPLANTS Bilateral 06/02/2019   Procedure: REMOVAL OF BILATERAL TISSUE EXPANDERS WITH PLACEMENT OF BILATERAL SILICONE BREAST IMPLANTS;  Surgeon: Glenna Fellows, MD;  Location: [facility];  Service: Plastics;  Laterality: Bilateral;   WISDOM TOOTH EXTRACTION     Patient Active Problem List   Diagnosis Date Noted   Port-A-Cath in place 05/14/2022   Malignant neoplasm of upper-outer quadrant of left breast in female, estrogen receptor positive (HCC) 11/18/2021   Family history of breast cancer    Family history of rectal cancer    Family history of lung cancer    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 No radiation completed. Pt is now done with [facility] and had a left lumpectomy with SLNB on 04/30/22 with 1 negative node removed for a total of 5 removed. Completed radiation.   PRECAUTIONS: left UE Lymphedema risk, None  SUBJECTIVE: Pt returns for her 3  month L-Dex screen .  PAIN:  Are you having pain? No.  SOZO SCREENING: Patient was assessed today using the SOZO machine to determine the lymphedema index score. This was compared to her baseline score. It was determined that she is within the recommended range when compared to her baseline and  no further action is needed at this time. She will continue SOZO screenings. These are done every 3 months for 2 years post operatively followed by every 6 months for 2 years, and then annually.   L-DEX FLOWSHEETS - 11/09/22 1000       L-DEX LYMPHEDEMA SCREENING   Measurement Type Unilateral    L-DEX MEASUREMENT EXTREMITY Upper Extremity    POSITION  Standing    DOMINANT SIDE Right    At Risk Side Left    BASELINE SCORE (UNILATERAL) 3.2    L-DEX SCORE (UNILATERAL) 7.3    VALUE CHANGE (UNILAT) 4.1               Hermenia Bers, PTA 11/09/2022, 10:29 AM

## 2022-11-10 ENCOUNTER — Other Ambulatory Visit: Payer: Self-pay

## 2022-11-13 ENCOUNTER — Encounter: Payer: Self-pay | Admitting: Hematology and Oncology

## 2022-11-17 ENCOUNTER — Telehealth: Payer: Self-pay | Admitting: Hematology and Oncology

## 2022-11-17 NOTE — Telephone Encounter (Signed)
Spoke with patient confirming upcoming appointment  

## 2022-11-23 ENCOUNTER — Encounter: Payer: Self-pay | Admitting: *Deleted

## 2022-11-23 ENCOUNTER — Ambulatory Visit: Payer: BC Managed Care – PPO

## 2022-11-23 ENCOUNTER — Other Ambulatory Visit: Payer: BC Managed Care – PPO

## 2022-11-23 ENCOUNTER — Other Ambulatory Visit: Payer: Self-pay

## 2022-11-23 ENCOUNTER — Ambulatory Visit: Payer: BC Managed Care – PPO | Admitting: Hematology and Oncology

## 2022-11-23 DIAGNOSIS — C50412 Malignant neoplasm of upper-outer quadrant of left female breast: Secondary | ICD-10-CM

## 2022-11-24 ENCOUNTER — Inpatient Hospital Stay: Payer: BC Managed Care – PPO | Attending: Hematology and Oncology | Admitting: Hematology and Oncology

## 2022-11-24 ENCOUNTER — Ambulatory Visit: Payer: BC Managed Care – PPO

## 2022-11-24 ENCOUNTER — Inpatient Hospital Stay: Payer: BC Managed Care – PPO | Attending: Hematology and Oncology

## 2022-11-24 VITALS — BP 121/71 | HR 76 | Temp 97.5°F | Resp 18 | Wt 155.7 lb

## 2022-11-24 DIAGNOSIS — C50412 Malignant neoplasm of upper-outer quadrant of left female breast: Secondary | ICD-10-CM

## 2022-11-24 DIAGNOSIS — Z170 Estrogen receptor positive status [ER+]: Secondary | ICD-10-CM | POA: Insufficient documentation

## 2022-11-24 DIAGNOSIS — Z9221 Personal history of antineoplastic chemotherapy: Secondary | ICD-10-CM | POA: Insufficient documentation

## 2022-11-24 DIAGNOSIS — Z923 Personal history of irradiation: Secondary | ICD-10-CM | POA: Diagnosis not present

## 2022-11-24 DIAGNOSIS — Z79899 Other long term (current) drug therapy: Secondary | ICD-10-CM | POA: Diagnosis not present

## 2022-11-24 DIAGNOSIS — Z5112 Encounter for antineoplastic immunotherapy: Secondary | ICD-10-CM | POA: Diagnosis present

## 2022-11-24 DIAGNOSIS — Z95828 Presence of other vascular implants and grafts: Secondary | ICD-10-CM

## 2022-11-24 LAB — CBC WITH DIFFERENTIAL (CANCER CENTER ONLY)
Abs Immature Granulocytes: 0.03 10*3/uL (ref 0.00–0.07)
Basophils Absolute: 0.1 10*3/uL (ref 0.0–0.1)
Basophils Relative: 1 %
Eosinophils Absolute: 0.1 10*3/uL (ref 0.0–0.5)
Eosinophils Relative: 2 %
HCT: 36.3 % (ref 36.0–46.0)
Hemoglobin: 11.9 g/dL — ABNORMAL LOW (ref 12.0–15.0)
Immature Granulocytes: 0 %
Lymphocytes Relative: 16 %
Lymphs Abs: 1.3 10*3/uL (ref 0.7–4.0)
MCH: 27 pg (ref 26.0–34.0)
MCHC: 32.8 g/dL (ref 30.0–36.0)
MCV: 82.5 fL (ref 80.0–100.0)
Monocytes Absolute: 0.3 10*3/uL (ref 0.1–1.0)
Monocytes Relative: 4 %
Neutro Abs: 6 10*3/uL (ref 1.7–7.7)
Neutrophils Relative %: 77 %
Platelet Count: 210 10*3/uL (ref 150–400)
RBC: 4.4 MIL/uL (ref 3.87–5.11)
RDW: 13.8 % (ref 11.5–15.5)
WBC Count: 7.8 10*3/uL (ref 4.0–10.5)
nRBC: 0 % (ref 0.0–0.2)

## 2022-11-24 LAB — CMP (CANCER CENTER ONLY)
ALT: 10 U/L (ref 0–44)
AST: 17 U/L (ref 15–41)
Albumin: 3.8 g/dL (ref 3.5–5.0)
Alkaline Phosphatase: 47 U/L (ref 38–126)
Anion gap: 5 (ref 5–15)
BUN: 12 mg/dL (ref 6–20)
CO2: 28 mmol/L (ref 22–32)
Calcium: 8.7 mg/dL — ABNORMAL LOW (ref 8.9–10.3)
Chloride: 108 mmol/L (ref 98–111)
Creatinine: 0.97 mg/dL (ref 0.44–1.00)
GFR, Estimated: >60 mL/min (ref >60)
Glucose, Bld: 111 mg/dL — ABNORMAL HIGH (ref 70–99)
Potassium: 3.8 mmol/L (ref 3.5–5.1)
Sodium: 141 mmol/L (ref 135–145)
Total Bilirubin: 0.5 mg/dL (ref <1.2)
Total Protein: 6.2 g/dL — ABNORMAL LOW (ref 6.5–8.1)

## 2022-11-24 MED ORDER — HEPARIN SOD (PORK) LOCK FLUSH 100 UNIT/ML IV SOLN
500.0000 [IU] | Freq: Once | INTRAVENOUS | Status: AC
  Administered 2022-11-24: 500 [IU]

## 2022-11-24 MED ORDER — SODIUM CHLORIDE 0.9% FLUSH
10.0000 mL | Freq: Once | INTRAVENOUS | Status: AC
  Administered 2022-11-24: 10 mL

## 2022-11-24 NOTE — Assessment & Plan Note (Signed)
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  She is status post neoadjuvant chemotherapy, lumpectomy, adjuvant radiation, and continues on maintenance Herceptin/Perjeta and tamoxifen  Breast Cancer Completed chemotherapy and radiation. Currently on Tamoxifen with reported fatigue and leg cramps. Discussed the potential for these symptoms to improve over time as the body recovers from treatments. -Continue Tamoxifen as prescribed. -Consider shifting exercise routine to mornings to help combat fatigue. -Continue increased magnesium for leg cramps.  Menstrual Changes Absence of menstruation since starting Tamoxifen. Discussed the potential for menstruation to return as the body adjusts to the medication. -Monitor for return of regular menstrual cycle.  Port Removal Port no longer needed following completion of chemotherapy. -Order placed for port removal by Interventional Radiology team.  General Health Maintenance -Guardant every 6 months to monitor ct DNA -MRI implants annually for surveillance. -Continue with recommended exercises to prevent lymphedema. -Consider taking a multivitamin for women or a designated Vitamin D supplement, pending results of Vitamin D levels. -Follow-up in six months or sooner if new or persistent symptoms arise.

## 2022-11-24 NOTE — Progress Notes (Signed)
[facility] Cancer Follow up:    Stephanie Pandy, MD  DIAGNOSIS:  Cancer Staging  Malignant neoplasm of upper-outer quadrant of left breast in female, estrogen receptor positive (HCC) Staging form: Breast, AJCC 8th Edition - Clinical: Stage IA (cT1c, cN0, cM0, G2, ER+, PR-, HER2+) - Signed by Rachel Moulds, MD on 11/18/2021 Histologic grading system: 3 grade system   SUMMARY OF ONCOLOGIC HISTORY: Oncology History  Malignant neoplasm of upper-outer quadrant of female breast (HCC) (Resolved)  10/20/2018 Initial Diagnosis   Malignant neoplasm of upper-outer quadrant of left breast in female, estrogen receptor positive (HCC)   10/20/2018 Cancer Staging   Staging form: Breast, AJCC 8th Edition - Clinical stage from 10/20/2018: Stage 0 (cTis (DCIS), cN0, cM0, ER+, PR-) - Signed by Lowella Dell, MD on 02/13/2020 Stage prefix: Initial diagnosis   11/03/2018 Genetic Testing   Negative genetic testing:  No pathogenic variants detected on the [facility] Multi-Cancer panel, ordered by Dr. Juliene Pina at [facility]. The report date is 11/03/2018.  The Multi-Cancer Panel offered by [facility] includes sequencing and/or deletion duplication testing of the following 84 genes: AIP, ALK, APC, ATM, AXIN2,BAP1,  BARD1, BLM, BMPR1A, BRCA1, BRCA2, BRIP1, CASR, CDC73, CDH1, CDK4, CDKN1B, CDKN1C, CDKN2A (p14ARF), CDKN2A (p16INK4a), CEBPA, CHEK2, CTNNA1, DICER1, DIS3L2, EGFR (c.2369C>T, p.Thr790Met variant only), EPCAM (Deletion/duplication testing only), FH, FLCN, GATA2, GPC3, GREM1 (Promoter region deletion/duplication testing only), HOXB13 (c.251G>A, p.Gly84Glu), HRAS, KIT, MAX, MEN1, MET, MITF (c.952G>A, p.Glu318Lys variant only), MLH1, MSH2, MSH3, MSH6, MUTYH, NBN, NF1, NF2, NTHL1, PALB2, PDGFRA, PHOX2B, PMS2, POLD1, POLE, POT1, PRKAR1A, PTCH1, PTEN, RAD50, RAD51C, RAD51D, RB1, RECQL4, RET, RUNX1, SDHAF2, SDHA (sequence changes only), SDHB, SDHC, SDHD, SMAD4, SMARCA4, SMARCB1, SMARCE1,  STK11, SUFU, TERC, TERT, TMEM127, TP53, TSC1, TSC2, VHL, WRN and WT1.     01/17/2019 Cancer Staging   Staging form: Breast, AJCC 8th Edition - Pathologic stage from 01/17/2019: Stage IA (pT7mi, pN0, cM0, G2, ER+, PR-, HER2-) - Signed by Loa Socks, NP on 02/01/2019   Malignant neoplasm of upper-outer quadrant of left breast in female, estrogen receptor positive (HCC)  10/21/2021 Breast US   Breast ultrasound of the palpable mass showed hypoechoic oval vascular mass with indistinct margins measuring 1.5 x 1 cm x 1.4 cm.  No abnormal lymph nodes found in the left axilla.  She had ultrasound-guided biopsy.  MRI scheduled for tomorrow.   10/29/2021 Pathology Results   Surgical pathology from the left breast needle core biopsy at 130 o'clock showed invasive ductal carcinoma, overall grade 2, prognostics ER 70% positive weak to moderate staining, PR 0% negative, Ki-67 of 80% and HER2 3+   11/18/2021 Cancer Staging   Staging form: Breast, AJCC 8th Edition - Clinical: Stage IA (cT1c, cN0, cM0, G2, ER+, PR-, HER2+) - Signed by Rachel Moulds, MD on 11/18/2021 Histologic grading system: 3 grade system   11/26/2021 - 03/16/2022 Chemotherapy   Patient is on Treatment Plan : BREAST  Docetaxel + Carboplatin + Trastuzumab + Pertuzumab  (TCHP) q21d      04/03/2022 -  Chemotherapy   Patient is on Treatment Plan : BREAST Trastuzumab + Pertuzumab q21d x 11 cycles     04/30/2022 Surgery   Left breast lumpectomy: no residual cancer identified, margins negative, treatment effect present, 1SLN negative for cancer.   06/24/2022 - 08/03/2022 Radiation Therapy   Adjuvant radiation therapy     CURRENT THERAPY: Herceptin/Perjeta; Tamoxifen  INTERVAL HISTORY:  Stephanie [name] 37 y.o. female returns for follow-up prior to receiving every  3-week Herceptin Perjeta.  The patient, with a history of breast cancer, reports some improvement in her condition since her last visit. She has been experiencing fatigue,  which she attributes to her medication, Tamoxifen. The fatigue is not constant, but there are moments when she feels she needs to rest. She also reports experiencing leg cramps, particularly at night. She has increased her magnesium intake, which seems to have improved this symptom. The patient also mentions a feeling of tightness and possible swelling in her hand, which she believes may be lymphedema. She has been doing exercises at home to manage this. The patient is due to have her port removed in the coming weeks. She will complete 1 yr of herceptin and perjeta this week.  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  MEDICAL HISTORY: Past Medical History:  Diagnosis Date   Atypical mole 03/14/2019   mod-left side superior   BRCA gene mutation negative in female    Breast cancer [facility])    Family history of breast cancer    Family history of lung cancer    Family history of rectal cancer    Melanoma (HCC) 10/14/2007   level II- right arm- (EXC)   PONV (postoperative nausea and vomiting)     SURGICAL HISTORY: Past Surgical History:  Procedure Laterality Date   BREAST BIOPSY Left 04/29/2022   [place] LT RADIOACTIVE SEED LOC 04/29/2022 [facility] MAMMOGRAPHY   BREAST LUMPECTOMY WITH RADIOACTIVE SEED AND SENTINEL LYMPH NODE BIOPSY Left 04/30/2022   Procedure: LEFT BREAST LUMPECTOMY WITH RADIOACTIVE SEED AND SENTINEL LYMPH NODE BIOPSY;  Surgeon: [name], MD;  Location: [facility];  Service: General;   Laterality: Left;   BREAST RECONSTRUCTION WITH PLACEMENT OF TISSUE EXPANDER AND ALLODERM Bilateral 01/17/2019   Procedure: BILATERAL BREAST RECONSTRUCTION WITH PLACEMENT OF TISSUE EXPANDER AND ALLODERM;  Surgeon: Glenna Fellows, MD;  Location: [facility];  Service: Plastics;  Laterality: Bilateral;   IR IMAGING GUIDED PORT INSERTION  11/25/2021   IR RADIOLOGIST EVAL & MGMT  11/26/2021   LESION REMOVAL Left 06/02/2019   Procedure: MINOR EXICISION OF LESION,LAYERED CLOSURE 1 CM;  Surgeon: Glenna Fellows, MD;  Location: [facility];  Service: Plastics;  Laterality: Left;   LIPOSUCTION WITH LIPOFILLING Bilateral 06/02/2019   Procedure: LIPOFILLING FROM ABDOMEN TO BILATERAL CHEST;  Surgeon: Glenna Fellows, MD;  Location: [facility];  Service: Plastics;  Laterality: Bilateral;   melanoma surgery Right 2009   upper right arm   NIPPLE SPARING MASTECTOMY WITH SENTINEL LYMPH NODE BIOPSY Bilateral 01/17/2019   Procedure: BILATERAL NIPPLE SPARING MASTECTOMIES WITH LEFT SENTINEL LYMPH NODE MAPPING;  Surgeon: [name], MD;  Location:  SURGERY CENTER;  Service: General;  Laterality: Bilateral;   REMOVAL OF BILATERAL TISSUE EXPANDERS WITH PLACEMENT OF BILATERAL BREAST IMPLANTS Bilateral 06/02/2019   Procedure: REMOVAL OF BILATERAL TISSUE EXPANDERS WITH PLACEMENT OF BILATERAL SILICONE BREAST IMPLANTS;  Surgeon: Glenna Fellows, MD;  Location:  SURGERY CENTER;  Service: Plastics;  Laterality: Bilateral;   WISDOM TOOTH EXTRACTION      SOCIAL HISTORY: Social History   Socioeconomic History   Marital status: Married    Spouse name: Not on file   Number of children: Not on file   Years of education: Not on file   Highest education level: Not on file  Occupational History   Not on file  Tobacco Use   Smoking status: Never   Smokeless tobacco: Never  Vaping Use   Vaping status: Never Used  Substance and Sexual Activity   Alcohol  use: Yes    Comment: occasional   Drug use: Never   Sexual activity: Yes    Birth control/protection: None  Other Topics Concern   Not on file  Social History Narrative   Not on file   Social Determinants of Health   Financial Resource Strain: Not on file  Food Insecurity: No Food Insecurity (05/25/2022)   Hunger Vital Sign    Worried About Running Out of Food in the Last Year: Never true    Ran Out of Food in the Last Year: Never true  Transportation Needs: No Transportation Needs (05/25/2022)   PRAPARE - Administrator, Civil Service (Medical): No    Lack of Transportation (Non-Medical): No  Physical Activity: Not on file  Stress: Not on file  Social Connections: Not on file  Intimate Partner Violence: Not At Risk (05/25/2022)   Humiliation, Afraid, Rape, and Kick questionnaire    Fear of Current or Ex-Partner: No    Emotionally Abused: No    Physically Abused: No    Sexually Abused: No    FAMILY HISTORY: Family History  Problem Relation Age of Onset   Hypertension Mother    Heart disease Father    Breast cancer Maternal Aunt 67 - 69   Breast cancer Maternal Aunt 61 - 59   Lung cancer Paternal Aunt        diagnosed late 34s   Breast cancer Maternal Grandmother 36 - 69   Breast cancer Paternal Grandmother 50 - 43   Lung cancer Paternal Grandmother        thought to be breast cancer mets   Rectal cancer Paternal Grandmother        diagnosed 56s   Lung cancer Paternal Grandfather        diagnosed 66s       PHYSICAL EXAMINATION   Vitals:   11/24/22 1001  BP: 121/71  Pulse: 76  Resp: 18  Temp: (!) 97.5 F (36.4 C)  SpO2: 100%   Physical Exam Constitutional:      General: She is not in acute distress.    Appearance: Normal appearance. She is not toxic-appearing.  HENT:     Head: Normocephalic and atraumatic.     Mouth/Throat:     Mouth: Mucous membranes are moist.     Pharynx: Oropharynx is clear. No oropharyngeal exudate or posterior  oropharyngeal erythema.  Eyes:     General: No scleral icterus. Cardiovascular:     Rate and Rhythm: Normal rate and regular rhythm.     Pulses: Normal pulses.     Heart sounds: Normal heart sounds.  Pulmonary:     Effort: Pulmonary effort is normal.     Breath sounds: Normal breath sounds.  Chest:     Comments: Bilateral mastectomy status post reconstruction with implants.  No concern for recurrence. Abdominal:     General: Abdomen is flat. Bowel sounds are normal. There is no distension.  Palpations: Abdomen is soft.     Tenderness: There is no abdominal tenderness.  Musculoskeletal:        General: No swelling.     Cervical back: Neck supple.  Lymphadenopathy:     Cervical: No cervical adenopathy.  Skin:    General: Skin is warm and dry.     Findings: No rash.  Neurological:     General: No focal deficit present.     Mental Status: She is alert.  Psychiatric:        Mood and Affect: Mood normal.        Behavior: Behavior normal.     LABORATORY DATA:  CBC    Component Value Date/Time   WBC 7.8 11/24/2022 0939   WBC 7.0 11/06/2022 0923   RBC 4.40 11/24/2022 0939   HGB 11.9 (L) 11/24/2022 0939   HCT 36.3 11/24/2022 0939   PLT 210 11/24/2022 0939   MCV 82.5 11/24/2022 0939   MCH 27.0 11/24/2022 0939   MCHC 32.8 11/24/2022 0939   RDW 13.8 11/24/2022 0939   LYMPHSABS 1.3 11/24/2022 0939   MONOABS 0.3 11/24/2022 0939   EOSABS 0.1 11/24/2022 0939   BASOSABS 0.1 11/24/2022 0939    CMP     Component Value Date/Time   NA 141 11/24/2022 0939   K 3.8 11/24/2022 0939   CL 108 11/24/2022 0939   CO2 28 11/24/2022 0939   GLUCOSE 111 (H) 11/24/2022 0939   BUN 12 11/24/2022 0939   CREATININE 0.97 11/24/2022 0939   CALCIUM 8.7 (L) 11/24/2022 0939   PROT 6.2 (L) 11/24/2022 0939   ALBUMIN 3.8 11/24/2022 0939   AST 17 11/24/2022 0939   ALT 10 11/24/2022 0939   ALKPHOS 47 11/24/2022 0939   BILITOT 0.5 11/24/2022 0939   GFRNONAA >60 11/24/2022 0939   GFRAA >60  06/04/2019 0201    ASSESSMENT and THERAPY PLAN:   Malignant neoplasm of upper-outer quadrant of left breast in female, estrogen receptor positive (HCC) Emerlyn is a 37 year old woman with recurrent left-sided stage Ia ER HER2 positive breast cancer diagnosed in October 2023.  She is status post neoadjuvant chemotherapy, lumpectomy, adjuvant radiation, and continues on maintenance Herceptin/Perjeta and tamoxifen  Breast Cancer Completed chemotherapy and radiation. Currently on Tamoxifen with reported fatigue and leg cramps. Discussed the potential for these symptoms to improve over time as the body recovers from treatments. -Continue Tamoxifen as prescribed. -Consider shifting exercise routine to mornings to help combat fatigue. -Continue increased magnesium for leg cramps.  Menstrual Changes Absence of menstruation since starting Tamoxifen. Discussed the potential for menstruation to return as the body adjusts to the medication. -Monitor for return of regular menstrual cycle.  Port Removal Port no longer needed following completion of chemotherapy. -Order placed for port removal by Interventional Radiology team.  General Health Maintenance -Guardant every 6 months to monitor ct DNA -MRI implants annually for surveillance. -Continue with recommended exercises to prevent lymphedema. -Consider taking a multivitamin for women or a designated Vitamin D supplement, pending results of Vitamin D levels. -Follow-up in six months or sooner if new or persistent symptoms arise.     All questions were answered. The patient knows to call the clinic with any problems, questions or concerns. We can certainly see the patient much sooner if necessary.  Total encounter time:30 minutes*in face-to-face visit time, chart review, lab review, care coordination, order entry, and documentation of the encounter time.   *Total Encounter Time as defined by the Centers for Medicare and Medicaid Services  includes, in addition to the face-to-face time of a patient visit (documented in the note above) non-face-to-face time: obtaining and reviewing outside history, ordering and reviewing medications, tests or procedures, care coordination (communications with other health care professionals or caregivers) and documentation in the medical record.

## 2022-11-26 ENCOUNTER — Other Ambulatory Visit: Payer: Self-pay

## 2022-11-27 ENCOUNTER — Inpatient Hospital Stay: Payer: BC Managed Care – PPO

## 2022-11-27 ENCOUNTER — Telehealth: Payer: Self-pay

## 2022-11-27 VITALS — BP 118/90 | HR 60 | Temp 98.5°F | Resp 16

## 2022-11-27 DIAGNOSIS — C50412 Malignant neoplasm of upper-outer quadrant of left female breast: Secondary | ICD-10-CM

## 2022-11-27 MED ORDER — ACETAMINOPHEN 325 MG PO TABS
650.0000 mg | ORAL_TABLET | Freq: Once | ORAL | Status: AC
Start: 2022-11-27 — End: 2022-11-27
  Administered 2022-11-27: 650 mg via ORAL
  Filled 2022-11-27: qty 2

## 2022-11-27 MED ORDER — TRASTUZUMAB-ANNS CHEMO 150 MG IV SOLR
6.0000 mg/kg | Freq: Once | INTRAVENOUS | Status: AC
  Administered 2022-11-27: 420 mg via INTRAVENOUS
  Filled 2022-11-27: qty 20

## 2022-11-27 MED ORDER — SODIUM CHLORIDE 0.9 % IV SOLN: Freq: Once | INTRAVENOUS | Status: AC

## 2022-11-27 MED ORDER — SODIUM CHLORIDE 0.9 % IV SOLN
420.0000 mg | Freq: Once | INTRAVENOUS | Status: AC
  Administered 2022-11-27: 420 mg via INTRAVENOUS
  Filled 2022-11-27: qty 14

## 2022-11-27 MED ORDER — SODIUM CHLORIDE 0.9% FLUSH
10.0000 mL | INTRAVENOUS | Status: DC | PRN
  Administered 2022-11-27: 10 mL

## 2022-11-27 MED ORDER — DIPHENHYDRAMINE HCL 25 MG PO CAPS
50.0000 mg | ORAL_CAPSULE | Freq: Once | ORAL | Status: AC
  Administered 2022-11-27: 50 mg via ORAL
  Filled 2022-11-27: qty 2

## 2022-11-27 MED ORDER — HEPARIN SOD (PORK) LOCK FLUSH 100 UNIT/ML IV SOLN
500.0000 [IU] | Freq: Once | INTRAVENOUS | Status: AC | PRN
  Administered 2022-11-27: 500 [IU]

## 2022-11-27 NOTE — Telephone Encounter (Signed)
Called pt per MD with negative Guardant Reveal results. Pt labs will be drawn in the next 6 months. Pt verbalize understanding.

## 2022-11-30 ENCOUNTER — Ambulatory Visit (HOSPITAL_COMMUNITY)
Admission: RE | Admit: 2022-11-30 | Discharge: 2022-11-30 | Disposition: A | Discharge status: Home / Self Care | Payer: BC Managed Care – PPO | Source: Ambulatory Visit | Attending: Hematology and Oncology | Admitting: Hematology and Oncology

## 2022-11-30 ENCOUNTER — Encounter: Payer: Self-pay | Admitting: *Deleted

## 2022-11-30 ENCOUNTER — Encounter: Payer: Self-pay | Admitting: Hematology and Oncology

## 2022-11-30 DIAGNOSIS — Z170 Estrogen receptor positive status [ER+]: Secondary | ICD-10-CM

## 2022-11-30 DIAGNOSIS — I081 Rheumatic disorders of both mitral and tricuspid valves: Secondary | ICD-10-CM | POA: Insufficient documentation

## 2022-11-30 DIAGNOSIS — C50412 Malignant neoplasm of upper-outer quadrant of left female breast: Secondary | ICD-10-CM

## 2022-11-30 DIAGNOSIS — Z0189 Encounter for other specified special examinations: Secondary | ICD-10-CM | POA: Diagnosis not present

## 2022-11-30 LAB — ECHOCARDIOGRAM COMPLETE
AR max vel: 1.91 cm2
AV Area VTI: 1.67 cm2
AV Area mean vel: 1.96 cm2
AV Mean grad: 3 mm[Hg]
AV Peak grad: 6 mm[Hg]
Ao pk vel: 1.22 m/s
Area-P 1/2: 3.45 cm2
S' Lateral: 3 cm

## 2022-12-08 ENCOUNTER — Other Ambulatory Visit (HOSPITAL_COMMUNITY): Payer: BC Managed Care – PPO

## 2022-12-09 ENCOUNTER — Ambulatory Visit (HOSPITAL_COMMUNITY)
Admission: RE | Admit: 2022-12-09 | Discharge: 2022-12-09 | Disposition: A | Discharge status: Home / Self Care | Payer: BC Managed Care – PPO | Source: Ambulatory Visit | Attending: Hematology and Oncology | Admitting: Hematology and Oncology

## 2022-12-09 DIAGNOSIS — Z452 Encounter for adjustment and management of vascular access device: Secondary | ICD-10-CM | POA: Insufficient documentation

## 2022-12-09 DIAGNOSIS — C50412 Malignant neoplasm of upper-outer quadrant of left female breast: Secondary | ICD-10-CM | POA: Diagnosis not present

## 2022-12-09 DIAGNOSIS — Z170 Estrogen receptor positive status [ER+]: Secondary | ICD-10-CM | POA: Diagnosis not present

## 2022-12-09 HISTORY — PX: IR REMOVAL TUN ACCESS W/ PORT W/O FL MOD SED: IMG2290

## 2022-12-09 MED ORDER — LIDOCAINE-EPINEPHRINE 1 %-1:100000 IJ SOLN
INTRAMUSCULAR | Status: AC
  Filled 2022-12-09: qty 1

## 2022-12-09 MED ORDER — LIDOCAINE-EPINEPHRINE 1 %-1:100000 IJ SOLN
20.0000 mL | Freq: Once | INTRAMUSCULAR | Status: AC
  Administered 2022-12-09: 20 mL via INTRADERMAL

## 2022-12-09 NOTE — Procedures (Signed)
Vascular and Interventional Radiology Procedure Note  Patient: Stephanie [name] DOB: 07-07-1985 Medical Record Number: [id number] Note Date/Time: 12/09/22 9:26 AM   Performing Physician: Roanna Banning, MD Assistant(s): None  Diagnosis: Breast cancer  Procedure: PORT REMOVAL  Anesthesia: Local Anesthetic Complications: None Estimated Blood Loss: Minimal Specimens:  None  Findings:  Successful removal of a right-sided venous port. Primary incision closure. Dermabond at skin.  See detailed procedure note with images in PACS. The patient tolerated the procedure well without incident or complication and was returned to Recovery in stable condition.    Roanna Banning, MD [facility] Rad[facility]   Pager. [phone] Clinic. [phone]

## 2022-12-11 ENCOUNTER — Encounter: Payer: Self-pay | Admitting: *Deleted

## 2023-01-15 ENCOUNTER — Inpatient Hospital Stay: Payer: BC Managed Care – PPO | Attending: Hematology and Oncology | Admitting: Adult Health

## 2023-01-15 ENCOUNTER — Encounter: Payer: Self-pay | Admitting: Adult Health

## 2023-01-15 VITALS — BP 110/70 | HR 64 | Temp 97.6°F | Resp 16 | Wt 156.4 lb

## 2023-01-15 DIAGNOSIS — Z79810 Long term (current) use of selective estrogen receptor modulators (SERMs): Secondary | ICD-10-CM | POA: Diagnosis not present

## 2023-01-15 DIAGNOSIS — C50412 Malignant neoplasm of upper-outer quadrant of left female breast: Secondary | ICD-10-CM | POA: Diagnosis present

## 2023-01-15 DIAGNOSIS — Z170 Estrogen receptor positive status [ER+]: Secondary | ICD-10-CM | POA: Insufficient documentation

## 2023-01-15 DIAGNOSIS — Z923 Personal history of irradiation: Secondary | ICD-10-CM | POA: Diagnosis not present

## 2023-01-15 DIAGNOSIS — Z5112 Encounter for antineoplastic immunotherapy: Secondary | ICD-10-CM | POA: Insufficient documentation

## 2023-01-15 NOTE — Progress Notes (Signed)
 SURVIVORSHIP VISIT:  BRIEF ONCOLOGIC HISTORY:  Oncology History  Malignant neoplasm of upper-outer quadrant of female breast (HCC) (Resolved)  10/20/2018 Initial Diagnosis   Malignant neoplasm of upper-outer quadrant of left breast in female, estrogen receptor positive (HCC)   10/20/2018 Cancer Staging   Staging form: Breast, AJCC 8th Edition - Clinical stage from 10/20/2018: Stage 0 (cTis (DCIS), cN0, cM0, ER+, PR-) - Signed by [name], MD on 02/13/2020 Stage prefix: Initial diagnosis   11/03/2018 Genetic Testing   Negative genetic testing:  No pathogenic variants detected on the [facility] Multi-Cancer panel, ordered by Dr. Barbette at [facility]. The report date is 11/03/2018.  The Multi-Cancer Panel offered by [facility] includes sequencing and/or deletion duplication testing of the following 84 genes: AIP, ALK, APC, ATM, AXIN2,BAP1,  BARD1, BLM, BMPR1A, BRCA1, BRCA2, BRIP1, CASR, CDC73, CDH1, CDK4, CDKN1B, CDKN1C, CDKN2A (p14ARF), CDKN2A (p16INK4a), CEBPA, CHEK2, CTNNA1, DICER1, DIS3L2, EGFR (c.2369C>T, p.Thr790Met variant only), EPCAM (Deletion/duplication testing only), FH, FLCN, GATA2, GPC3, GREM1 (Promoter region deletion/duplication testing only), HOXB13 (c.251G>A, p.Gly84Glu), HRAS, KIT, MAX, MEN1, MET, MITF (c.952G>A, p.Glu318Lys variant only), MLH1, MSH2, MSH3, MSH6, MUTYH, NBN, NF1, NF2, NTHL1, PALB2, PDGFRA, PHOX2B, PMS2, POLD1, POLE, POT1, PRKAR1A, PTCH1, PTEN, RAD50, RAD51C, RAD51D, RB1, RECQL4, RET, RUNX1, SDHAF2, SDHA (sequence changes only), SDHB, SDHC, SDHD, SMAD4, SMARCA4, SMARCB1, SMARCE1, STK11, SUFU, TERC, TERT, TMEM127, TP53, TSC1, TSC2, VHL, WRN and WT1.     01/17/2019 Cancer Staging   Staging form: Breast, AJCC 8th Edition - Pathologic stage from 01/17/2019: Stage IA (pT41mi, pN0, cM0, G2, ER+, PR-, HER2-) - Signed by [name], NP on 02/01/2019   07/01/2022 - 08/11/2022 Radiation Therapy   Plan Name: CW_L_BH_BO Site: Chest Wall,  Left Technique: 3D Mode: Photon Dose Per Fraction: 1.8 Gy Prescribed Dose (Delivered / Prescribed): 25.2 Gy / 25.2 Gy Prescribed Fxs (Delivered / Prescribed): 14 / 14   Plan Name: CW_L_BH Site: Chest Wall, Left Technique: 3D Mode: Photon Dose Per Fraction: 1.8 Gy Prescribed Dose (Delivered / Prescribed): 25.2 Gy / 25.2 Gy Prescribed Fxs (Delivered / Prescribed): 14 / 14   08/2022 -  Anti-estrogen oral therapy   Tamoxifen    Malignant neoplasm of upper-outer quadrant of left breast in female, estrogen receptor positive (HCC)  10/21/2021 Breast US    Breast ultrasound of the palpable mass showed hypoechoic oval vascular mass with indistinct margins measuring 1.5 x 1 cm x 1.4 cm.  No abnormal lymph nodes found in the left axilla.  She had ultrasound-guided biopsy.  MRI scheduled for tomorrow.   10/29/2021 Pathology Results   Surgical pathology from the left breast needle core biopsy at 130 o'clock showed invasive ductal carcinoma, overall grade 2, prognostics ER 70% positive weak to moderate staining, PR 0% negative, Ki-67 of 80% and HER2 3+   11/18/2021 Cancer Staging   Staging form: Breast, AJCC 8th Edition - Clinical: Stage IA (cT1c, cN0, cM0, G2, ER+, PR-, HER2+) - Signed by [name], MD on 11/18/2021 Histologic grading system: 3 grade system   11/26/2021 - 03/16/2022 Chemotherapy   Patient is on Treatment Plan : BREAST  Docetaxel  + Carboplatin  + Trastuzumab  + Pertuzumab   (TCHP) q21d      04/03/2022 -  Chemotherapy   Patient is on Treatment Plan : BREAST Trastuzumab  + Pertuzumab  q21d x 11 cycles     04/30/2022 Surgery   Left breast lumpectomy: no residual cancer identified, margins negative, treatment effect present, 1SLN negative for cancer.   06/24/2022 - 08/03/2022 Radiation Therapy   Adjuvant radiation  therapy     INTERVAL HISTORY:  Ms. Newmann to review her survivorship care plan detailing her treatment course for breast cancer, as well as monitoring long-term side effects  of that treatment, education regarding health maintenance, screening, and overall wellness and health promotion.     Overall, Ms. Kumpf reports feeling quite well.  She is taking tamoxifen  daily and tells me that she struggles with the side effects in particular feeling itchy in her skin.  She also stopped taking Zyrtec around the same time.  She also is experiencing muscle cramps that occur no specific pattern time.  She is taking a magnesium  supplement daily.  REVIEW OF SYSTEMS:  Review of Systems  Constitutional:  Positive for fatigue. Negative for appetite change, chills, fever and unexpected weight change.  HENT:   Negative for hearing loss, lump/mass and trouble swallowing.   Eyes:  Negative for eye problems and icterus.  Respiratory:  Negative for chest tightness, cough and shortness of breath.   Cardiovascular:  Negative for chest pain, leg swelling and palpitations.  Gastrointestinal:  Negative for abdominal distention, abdominal pain, constipation, diarrhea, nausea and vomiting.  Endocrine: Negative for hot flashes.  Genitourinary:  Negative for difficulty urinating.   Musculoskeletal:  Negative for arthralgias.  Skin:  Positive for itching. Negative for rash.  Neurological:  Negative for dizziness, extremity weakness, headaches and numbness.  Hematological:  Negative for adenopathy. Does not bruise/bleed easily.  Psychiatric/Behavioral:  Negative for depression. The patient is not nervous/anxious.   Breast: Denies any new nodularity, masses, tenderness, nipple changes, or nipple discharge.       PAST MEDICAL/SURGICAL HISTORY:  Past Medical History:  Diagnosis Date   Atypical mole 03/14/2019   mod-left side superior   BRCA gene mutation negative in female    Breast cancer [facility])    Family history of breast cancer    Family history of lung cancer    Family history of rectal cancer    Melanoma (HCC) 10/14/2007   level II- right arm- (EXC)   PONV (postoperative nausea and  vomiting)    Past Surgical History:  Procedure Laterality Date   BREAST BIOPSY Left 04/29/2022   US  LT RADIOACTIVE SEED LOC 04/29/2022 [facility] MAMMOGRAPHY   BREAST LUMPECTOMY WITH RADIOACTIVE SEED AND SENTINEL LYMPH NODE BIOPSY Left 04/30/2022   Procedure: LEFT BREAST LUMPECTOMY WITH RADIOACTIVE SEED AND SENTINEL LYMPH NODE BIOPSY;  Surgeon: Vanderbilt Ned, MD;  Location: [facility];  Service: General;  Laterality: Left;   BREAST RECONSTRUCTION WITH PLACEMENT OF TISSUE EXPANDER AND ALLODERM Bilateral 01/17/2019   Procedure: BILATERAL BREAST RECONSTRUCTION WITH PLACEMENT OF TISSUE EXPANDER AND ALLODERM;  Surgeon: Arelia Filippo, MD;  Location: [facility];  Service: Plastics;  Laterality: Bilateral;   IR IMAGING GUIDED PORT INSERTION  11/25/2021   IR RADIOLOGIST EVAL & MGMT  11/26/2021   IR REMOVAL TUN ACCESS W/ PORT W/O FL MOD SED  12/09/2022   LESION REMOVAL Left 06/02/2019   Procedure: MINOR EXICISION OF LESION,LAYERED CLOSURE 1 CM;  Surgeon: Arelia Filippo, MD;  Location: [facility];  Service: Plastics;  Laterality: Left;   LIPOSUCTION WITH LIPOFILLING Bilateral 06/02/2019   Procedure: LIPOFILLING FROM ABDOMEN TO BILATERAL CHEST;  Surgeon: Arelia Filippo, MD;  Location: [facility];  Service: Plastics;  Laterality: Bilateral;   melanoma surgery Right 2009   upper right arm   NIPPLE SPARING MASTECTOMY WITH SENTINEL LYMPH NODE BIOPSY Bilateral 01/17/2019   Procedure: BILATERAL NIPPLE SPARING MASTECTOMIES WITH LEFT SENTINEL LYMPH  NODE MAPPING;  Surgeon: Vanderbilt Ned, MD;  Location: [facility];  Service: General;  Laterality: Bilateral;   REMOVAL OF BILATERAL TISSUE EXPANDERS WITH PLACEMENT OF BILATERAL BREAST IMPLANTS Bilateral 06/02/2019   Procedure: REMOVAL OF BILATERAL TISSUE EXPANDERS WITH PLACEMENT OF BILATERAL SILICONE BREAST IMPLANTS;  Surgeon: Arelia Filippo, MD;  Location: [facility];  Service:  Plastics;  Laterality: Bilateral;   WISDOM TOOTH EXTRACTION       ALLERGIES:  Allergies  Allergen Reactions   Oxycodone  Nausea Only    Severe nausea     CURRENT MEDICATIONS:  Outpatient Encounter Medications as of 01/15/2023  Medication Sig   magnesium  30 MG tablet Take 30 mg by mouth 2 (two) times daily.   tamoxifen  (NOLVADEX ) 20 MG tablet Take 1 tablet (20 mg total) by mouth daily.   cyanocobalamin 100 MCG tablet Take 100 mcg by mouth daily. (Patient not taking: Reported on 01/15/2023)   meclizine  (ANTIVERT ) 25 MG tablet Take 25 mg by mouth 3 (three) times daily as needed (Vertigo/Motion Sickness). (Patient not taking: Reported on 01/15/2023)   [DISCONTINUED] cetirizine (ZYRTEC) 10 MG tablet Take 10 mg by mouth daily.    [DISCONTINUED] ibuprofen  (ADVIL ) 800 MG tablet Take 1 tablet (800 mg total) by mouth every 8 (eight) hours as needed.   [DISCONTINUED] Multiple Vitamins-Minerals (MULTIVITAMIN WITH MINERALS) tablet Take 1 tablet by mouth daily. (Patient not taking: Reported on 01/15/2023)   No facility-administered encounter medications on file as of 01/15/2023.     ONCOLOGIC FAMILY HISTORY:  Family History  Problem Relation Age of Onset   Hypertension Mother    Heart disease Father    Breast cancer Maternal Aunt 9 - 69   Breast cancer Maternal Aunt 58 - 59   Lung cancer Paternal Aunt        diagnosed late 57s   Breast cancer Maternal Grandmother 16 - 69   Breast cancer Paternal Grandmother 31 - 18   Lung cancer Paternal Grandmother        thought to be breast cancer mets   Rectal cancer Paternal Grandmother        diagnosed 65s   Lung cancer Paternal Grandfather        diagnosed 25s     SOCIAL HISTORY:  Social History   Socioeconomic History   Marital status: Married    Spouse name: Not on file   Number of children: Not on file   Years of education: Not on file   Highest education level: Not on file  Occupational History   Not on file  Tobacco Use   Smoking  status: Never   Smokeless tobacco: Never  Vaping Use   Vaping status: Never Used  Substance and Sexual Activity   Alcohol use: Yes    Comment: occasional   Drug use: Never   Sexual activity: Yes    Birth control/protection: None  Other Topics Concern   Not on file  Social History Narrative   Not on file   Social Drivers of Health   Financial Resource Strain: Not on file  Food Insecurity: No Food Insecurity (05/25/2022)   Hunger Vital Sign    Worried About Running Out of Food in the Last Year: Never true    Ran Out of Food in the Last Year: Never true  Transportation Needs: No Transportation Needs (05/25/2022)   PRAPARE - Administrator, Civil Service (Medical): No    Lack of Transportation (Non-Medical): No  Physical Activity: Not on file  Stress: Not on file  Social Connections: Not on file  Intimate Partner Violence: Not At Risk (05/25/2022)   Humiliation, Afraid, Rape, and Kick questionnaire    Fear of Current or Ex-Partner: No    Emotionally Abused: No    Physically Abused: No    Sexually Abused: No     OBSERVATIONS/OBJECTIVE:  BP 110/70 (BP Location: Right Arm, Patient Position: Sitting)   Pulse 64   Temp 97.6 F (36.4 C) (Temporal)   Resp 16   Wt 156 lb 6.4 oz (70.9 kg)   SpO2 100%   BMI 25.24 kg/m  GENERAL: Patient is a well appearing female in no acute distress HEENT:  Sclerae anicteric.  Oropharynx clear and moist. No ulcerations or evidence of oropharyngeal candidiasis. Neck is supple.  NODES:  No cervical, supraclavicular, or axillary lymphadenopathy palpated.  BREAST EXAM: Status post bilateral mastectomies with reconstruction, left breast status post radiation and excision, no sign of local recurrence LUNGS:  Clear to auscultation bilaterally.  No wheezes or rhonchi. HEART:  Regular rate and rhythm. No murmur appreciated. ABDOMEN:  Soft, nontender.  Positive, normoactive bowel sounds. No organomegaly palpated. MSK:  No focal spinal  tenderness to palpation. Full range of motion bilaterally in the upper extremities. EXTREMITIES:  No peripheral edema.   SKIN:  Clear with no obvious rashes or skin changes. No nail dyscrasia. NEURO:  Nonfocal. Well oriented.  Appropriate affect.   LABORATORY DATA:  None for this visit.  DIAGNOSTIC IMAGING:  None for this visit.      ASSESSMENT AND PLAN:  Ms.. Ninneman is a pleasant 38 y.o. female with Stage IA left breast invasive ductal carcinoma, ER+/PR+/HER2+, diagnosed in 10/2021, treated with neoadjuvant chemotherapy, lumpectomy, maintenance Herceptin /Perjeta , adjuvant radiation therapy, and anti-estrogen therapy with Tamoxifen  beginning in 08/2022.  She presents to the Survivorship Clinic for our initial meeting and routine follow-up post-completion of treatment for breast cancer.    1. Stage IA left breast cancer:  Ms. Vanwyk is continuing to recover from definitive treatment for breast cancer. She will follow-up with her medical oncologist, Dr. Loretha in 05/2023 with history and physical exam per surveillance protocol.  She will continue her anti-estrogen therapy with Tamoxifen .  Scheduled for breast MRI in March 2025.  Today, a comprehensive survivorship care plan and treatment summary was reviewed with the patient today detailing her breast cancer diagnosis, treatment course, potential late/long-term effects of treatment, appropriate follow-up care with recommendations for the future, and patient education resources.  A copy of this summary, along with a letter will be sent to the patient's primary care provider via mail/fax/In Basket message after today's visit.    2.  Itching: I recommended that she use Cetaphil cream and see if this helps.  We also discussed potentially restarting Zyrtec  3.  Muscle cramps: I suggested she use echo electrolyte replacement since she is an active exerciser.  4. Bone health:    She was given education on specific activities to promote bone health.  5.  Cancer screening:  Due to Ms. Canale's history and her age, she should receive screening for skin cancers, colon cancer (when due), and gynecologic cancers.  The information and recommendations are listed on the patient's comprehensive care plan/treatment summary and were reviewed in detail with the patient.    6. Health maintenance and wellness promotion: Ms. Stingley was encouraged to consume 5-7 servings of fruits and vegetables per day. We reviewed the pillars of health from the [employer] of [employer]estyle [employer].  She was  also encouraged to engage in moderate to vigorous exercise for 30 minutes per day most days of the week.  She was instructed to limit her alcohol consumption and continue to abstain from tobacco use.     7. Support services/counseling: It is not uncommon for this period of the patient's cancer care trajectory to be one of many emotions and stressors.   She was given information regarding our available services and encouraged to contact me with any questions or for help enrolling in any of our support group/programs.    Follow up instructions:    -Return to cancer center in May 2025 for follow-up with Dr. Iruku -Breast MRI in March 2025 -Continue Guardant testing every 6 months -She is welcome to return back to the Survivorship Clinic at any time; no additional follow-up needed at this time.  -Consider referral back to survivorship as a long-term survivor for continued surveillance  The patient was provided an opportunity to ask questions and all were answered. The patient agreed with the plan and demonstrated an understanding of the instructions.   Total encounter time:50 minutes*in face-to-face visit time, chart review, lab review, care coordination, order entry, and documentation of the encounter time.    Morna Kendall, NP 01/15/23 11:43 AM Medical Oncology and Hematology [facility] [address] [place], [place] [address] Tel. [phone]    Fax.  [phone]  *Total Encounter Time as defined by the Centers for Medicare and Medicaid Services includes, in addition to the face-to-face time of a patient visit (documented in the note above) non-face-to-face time: obtaining and reviewing outside history, ordering and reviewing medications, tests or procedures, care coordination (communications with other health care professionals or caregivers) and documentation in the medical record.

## 2023-02-08 ENCOUNTER — Ambulatory Visit: Payer: BC Managed Care – PPO | Attending: Hematology and Oncology

## 2023-02-08 VITALS — Wt 151.2 lb

## 2023-02-08 DIAGNOSIS — Z483 Aftercare following surgery for neoplasm: Secondary | ICD-10-CM | POA: Insufficient documentation

## 2023-02-08 NOTE — Therapy (Signed)
OUTPATIENT PHYSICAL THERAPY SOZO SCREENING NOTE   Patient Name: Stephanie [name] MRN: [id number] DOB:07/19/1985, 38 y.o., female Today's Date: 02/08/2023  PCP: Estanislado Pandy, MD REFERRING PROVIDER: Rachel Moulds, MD   PT End of Session - 02/08/23 1551     Visit Number 4   # unchanged due to screen only   PT Start Time 1549    PT Stop Time 1553    PT Time Calculation (min) 4 min    Activity Tolerance Patient tolerated treatment well    Behavior During Therapy [facility] for tasks assessed/performed             Past Medical History:  Diagnosis Date   Atypical mole 03/14/2019   mod-left side superior   BRCA gene mutation negative in female    Breast cancer [facility])    Family history of breast cancer    Family history of lung cancer    Family history of rectal cancer    Melanoma (HCC) 10/14/2007   level II- right arm- (EXC)   PONV (postoperative nausea and vomiting)    Past Surgical History:  Procedure Laterality Date   BREAST BIOPSY Left 04/29/2022   [place] LT RADIOACTIVE SEED LOC 04/29/2022 [facility] MAMMOGRAPHY   BREAST LUMPECTOMY WITH RADIOACTIVE SEED AND SENTINEL LYMPH NODE BIOPSY Left 04/30/2022   Procedure: LEFT BREAST LUMPECTOMY WITH RADIOACTIVE SEED AND SENTINEL LYMPH NODE BIOPSY;  Surgeon: [name], MD;  Location: [facility];  Service: General;  Laterality: Left;   BREAST RECONSTRUCTION WITH PLACEMENT OF TISSUE EXPANDER AND ALLODERM Bilateral 01/17/2019   Procedure: BILATERAL BREAST RECONSTRUCTION WITH PLACEMENT OF TISSUE EXPANDER AND ALLODERM;  Surgeon: Glenna Fellows, MD;  Location: [facility];  Service: Plastics;  Laterality: Bilateral;   IR IMAGING GUIDED PORT INSERTION  11/25/2021   IR RADIOLOGIST EVAL & MGMT  11/26/2021   IR REMOVAL TUN ACCESS W/ PORT W/O FL MOD SED  12/09/2022   LESION REMOVAL Left 06/02/2019   Procedure: MINOR EXICISION OF LESION,LAYERED CLOSURE 1 CM;  Surgeon: Glenna Fellows, MD;  Location: [facility];  Service: Plastics;  Laterality: Left;   LIPOSUCTION WITH LIPOFILLING Bilateral 06/02/2019   Procedure: LIPOFILLING FROM ABDOMEN TO BILATERAL CHEST;  Surgeon: Glenna Fellows, MD;  Location: [facility];  Service: Plastics;  Laterality: Bilateral;   melanoma surgery Right 2009   upper right arm   NIPPLE SPARING MASTECTOMY WITH SENTINEL LYMPH NODE BIOPSY Bilateral 01/17/2019   Procedure: BILATERAL NIPPLE SPARING MASTECTOMIES WITH LEFT SENTINEL LYMPH NODE MAPPING;  Surgeon: [name], MD;  Location: [facility];  Service: General;  Laterality: Bilateral;   REMOVAL OF BILATERAL TISSUE EXPANDERS WITH PLACEMENT OF BILATERAL BREAST IMPLANTS Bilateral 06/02/2019   Procedure: REMOVAL OF BILATERAL TISSUE EXPANDERS WITH PLACEMENT OF BILATERAL SILICONE BREAST IMPLANTS;  Surgeon: Glenna Fellows, MD;  Location: [facility];  Service: Plastics;  Laterality: Bilateral;   WISDOM TOOTH EXTRACTION     Patient Active Problem List   Diagnosis Date Noted   Port-A-Cath in place 05/14/2022   Malignant neoplasm of upper-outer quadrant of left breast in female, estrogen receptor positive (HCC) 11/18/2021   Family history of breast cancer    Family history of rectal cancer    Family history of lung cancer    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 No radiation completed. Pt is now done with [facility] and had a left lumpectomy with SLNB on 04/30/22 with 1 negative node removed for a total of 5 removed. Completed radiation.   PRECAUTIONS:  left UE Lymphedema risk, None  SUBJECTIVE: Pt returns for her 3 month L-Dex screen .  PAIN:  Are you having pain? No.  SOZO SCREENING: Patient was assessed today using the SOZO machine to determine the lymphedema index score. This was compared to her baseline score. It was determined that she is within the recommended range when compared to her baseline and no further action is needed at this time. She will continue SOZO screenings. These are done every 3 months for 2 years post operatively followed by every 6 months for 2 years, and then annually.   L-DEX FLOWSHEETS - 02/08/23 1500       L-DEX LYMPHEDEMA SCREENING   Measurement Type Unilateral    L-DEX MEASUREMENT EXTREMITY Upper Extremity    POSITION  Standing    DOMINANT SIDE Right    At Risk Side Left    BASELINE SCORE (UNILATERAL) 3.2    L-DEX SCORE (UNILATERAL) 3.5    VALUE CHANGE (UNILAT) 0.3               Hermenia Bers, PTA 02/08/2023, 3:52 PM

## 2023-04-07 ENCOUNTER — Ambulatory Visit
Admission: RE | Admit: 2023-04-07 | Discharge: 2023-04-07 | Disposition: A | Discharge status: Home / Self Care | Payer: BC Managed Care – PPO | Source: Ambulatory Visit | Attending: Hematology and Oncology

## 2023-04-07 DIAGNOSIS — Z170 Estrogen receptor positive status [ER+]: Secondary | ICD-10-CM

## 2023-04-07 MED ORDER — GADOPICLENOL 0.5 MMOL/ML IV SOLN
7.0000 mL | Freq: Once | INTRAVENOUS | Status: AC | PRN
  Administered 2023-04-07: 7 mL via INTRAVENOUS

## 2023-04-09 ENCOUNTER — Encounter: Payer: Self-pay | Admitting: *Deleted

## 2023-04-09 ENCOUNTER — Telehealth: Payer: Self-pay | Admitting: *Deleted

## 2023-04-09 NOTE — Telephone Encounter (Addendum)
 Called pt and left message as well as sent a My Chart message to schedule appt as possible for correlation of  skin changes.  ----- Message from Holley Dexter sent at 04/08/2023  7:52 AM EDT ----- Regarding: Breast MRI Recommendations Hello Dr. Al Pimple,  The above patient had a breast MRI on 04/07/23 and below are the recommendations of Dr. Ted Mcalpine.  RECOMMENDATION: Recommend correlation with physical exam regarding left periareolar skin changes. If found clinically suspicious, punch skin biopsy may be considered.  Otherwise, follow-up breast MRI may be considered in 6 months.  Please review the recommendations with the patient and enter any orders necessary.  Thank you. Phineas Semen [facility]

## 2023-04-13 ENCOUNTER — Telehealth: Payer: Self-pay

## 2023-04-13 NOTE — Progress Notes (Signed)
 BRIEF ONCOLOGIC HISTORY:  Oncology History  Malignant neoplasm of upper-outer quadrant of female breast (HCC) (Resolved)  10/20/2018 Initial Diagnosis   Malignant neoplasm of upper-outer quadrant of left breast in female, estrogen receptor positive (HCC)   10/20/2018 Cancer Staging   Staging form: Breast, AJCC 8th Edition - Clinical stage from 10/20/2018: Stage 0 (cTis (DCIS), cN0, cM0, ER+, PR-) - Signed by Lowella Dell, MD on 02/13/2020 Stage prefix: Initial diagnosis   11/03/2018 Genetic Testing   Negative genetic testing:  No pathogenic variants detected on the [facility] Multi-Cancer panel, ordered by Dr. Juliene Pina at [facility]. The report date is 11/03/2018.  The Multi-Cancer Panel offered by [facility] includes sequencing and/or deletion duplication testing of the following 84 genes: AIP, ALK, APC, ATM, AXIN2,BAP1,  BARD1, BLM, BMPR1A, BRCA1, BRCA2, BRIP1, CASR, CDC73, CDH1, CDK4, CDKN1B, CDKN1C, CDKN2A (p14ARF), CDKN2A (p16INK4a), CEBPA, CHEK2, CTNNA1, DICER1, DIS3L2, EGFR (c.2369C>T, p.Thr790Met variant only), EPCAM (Deletion/duplication testing only), FH, FLCN, GATA2, GPC3, GREM1 (Promoter region deletion/duplication testing only), HOXB13 (c.251G>A, p.Gly84Glu), HRAS, KIT, MAX, MEN1, MET, MITF (c.952G>A, p.Glu318Lys variant only), MLH1, MSH2, MSH3, MSH6, MUTYH, NBN, NF1, NF2, NTHL1, PALB2, PDGFRA, PHOX2B, PMS2, POLD1, POLE, POT1, PRKAR1A, PTCH1, PTEN, RAD50, RAD51C, RAD51D, RB1, RECQL4, RET, RUNX1, SDHAF2, SDHA (sequence changes only), SDHB, SDHC, SDHD, SMAD4, SMARCA4, SMARCB1, SMARCE1, STK11, SUFU, TERC, TERT, TMEM127, TP53, TSC1, TSC2, VHL, WRN and WT1.     01/17/2019 Cancer Staging   Staging form: Breast, AJCC 8th Edition - Pathologic stage from 01/17/2019: Stage IA (pT53mi, pN0, cM0, G2, ER+, PR-, HER2-) - Signed by Loa Socks, NP on 02/01/2019   07/01/2022 - 08/11/2022 Radiation Therapy   Plan Name: CW_L_BH_BO Site: Chest Wall, Left Technique: 3D Mode:  Photon Dose Per Fraction: 1.8 Gy Prescribed Dose (Delivered / Prescribed): 25.2 Gy / 25.2 Gy Prescribed Fxs (Delivered / Prescribed): 14 / 14   Plan Name: CW_L_BH Site: Chest Wall, Left Technique: 3D Mode: Photon Dose Per Fraction: 1.8 Gy Prescribed Dose (Delivered / Prescribed): 25.2 Gy / 25.2 Gy Prescribed Fxs (Delivered / Prescribed): 14 / 14   08/2022 -  Anti-estrogen oral therapy   Tamoxifen   Malignant neoplasm of upper-outer quadrant of left breast in female, estrogen receptor positive (HCC)  10/21/2021 Breast US   Breast ultrasound of the palpable mass showed hypoechoic oval vascular mass with indistinct margins measuring 1.5 x 1 cm x 1.4 cm.  No abnormal lymph nodes found in the left axilla.  She had ultrasound-guided biopsy.  MRI scheduled for tomorrow.   10/29/2021 Pathology Results   Surgical pathology from the left breast needle core biopsy at 130 o'clock showed invasive ductal carcinoma, overall grade 2, prognostics ER 70% positive weak to moderate staining, PR 0% negative, Ki-67 of 80% and HER2 3+   11/18/2021 Cancer Staging   Staging form: Breast, AJCC 8th Edition - Clinical: Stage IA (cT1c, cN0, cM0, G2, ER+, PR-, HER2+) - Signed by Rachel Moulds, MD on 11/18/2021 Histologic grading system: 3 grade system   11/26/2021 - 03/16/2022 Chemotherapy   Patient is on Treatment Plan : BREAST  Docetaxel + Carboplatin + Trastuzumab + Pertuzumab  (TCHP) q21d      04/03/2022 -  Chemotherapy   Patient is on Treatment Plan : BREAST Trastuzumab + Pertuzumab q21d x 11 cycles     04/30/2022 Surgery   Left breast lumpectomy: no residual cancer identified, margins negative, treatment effect present, 1SLN negative for cancer.   06/24/2022 - 08/03/2022 Radiation Therapy   Adjuvant radiation therapy  08/2022 -  Anti-estrogen oral therapy   Tamoxifen daily     INTERVAL HISTORY:  Ms. Rivet had an MRI which commented on some nipple crusting, hence here for a clinic follow up.  REVIEW OF  SYSTEMS:  Review of Systems  Constitutional:  Positive for fatigue. Negative for appetite change, chills, fever and unexpected weight change.  HENT:   Negative for hearing loss, lump/mass and trouble swallowing.   Eyes:  Negative for eye problems and icterus.  Respiratory:  Negative for chest tightness, cough and shortness of breath.   Cardiovascular:  Negative for chest pain, leg swelling and palpitations.  Gastrointestinal:  Negative for abdominal distention, abdominal pain, constipation, diarrhea, nausea and vomiting.  Endocrine: Negative for hot flashes.  Genitourinary:  Negative for difficulty urinating.   Musculoskeletal:  Negative for arthralgias.  Skin:  Positive for itching. Negative for rash.  Neurological:  Negative for dizziness, extremity weakness, headaches and numbness.  Hematological:  Negative for adenopathy. Does not bruise/bleed easily.  Psychiatric/Behavioral:  Negative for depression. The patient is not nervous/anxious.   Breast: Denies any new nodularity, masses, tenderness, nipple changes, or nipple discharge.       PAST MEDICAL/SURGICAL HISTORY:  Past Medical History:  Diagnosis Date   Atypical mole 03/14/2019   mod-left side superior   BRCA gene mutation negative in female    Breast cancer [facility])    Family history of breast cancer    Family history of lung cancer    Family history of rectal cancer    Melanoma (HCC) 10/14/2007   level II- right arm- (EXC)   PONV (postoperative nausea and vomiting)    Past Surgical History:  Procedure Laterality Date   BREAST BIOPSY Left 04/29/2022   [place] LT RADIOACTIVE SEED LOC 04/29/2022 [facility] MAMMOGRAPHY   BREAST LUMPECTOMY WITH RADIOACTIVE SEED AND SENTINEL LYMPH NODE BIOPSY Left 04/30/2022   Procedure: LEFT BREAST LUMPECTOMY WITH RADIOACTIVE SEED AND SENTINEL LYMPH NODE BIOPSY;  Surgeon: [name], MD;  Location: [facility];  Service: General;  Laterality: Left;   BREAST RECONSTRUCTION WITH PLACEMENT  OF TISSUE EXPANDER AND ALLODERM Bilateral 01/17/2019   Procedure: BILATERAL BREAST RECONSTRUCTION WITH PLACEMENT OF TISSUE EXPANDER AND ALLODERM;  Surgeon: Glenna Fellows, MD;  Location: [facility];  Service: Plastics;  Laterality: Bilateral;   IR IMAGING GUIDED PORT INSERTION  11/25/2021   IR RADIOLOGIST EVAL & MGMT  11/26/2021   IR REMOVAL TUN ACCESS W/ PORT W/O FL MOD SED  12/09/2022   LESION REMOVAL Left 06/02/2019   Procedure: MINOR EXICISION OF LESION,LAYERED CLOSURE 1 CM;  Surgeon: Glenna Fellows, MD;  Location: [facility];  Service: Plastics;  Laterality: Left;   LIPOSUCTION WITH LIPOFILLING Bilateral 06/02/2019   Procedure: LIPOFILLING FROM ABDOMEN TO BILATERAL CHEST;  Surgeon: Glenna Fellows, MD;  Location: [facility];  Service: Plastics;  Laterality: Bilateral;   melanoma surgery Right 2009   upper right arm   NIPPLE SPARING MASTECTOMY WITH SENTINEL LYMPH NODE BIOPSY Bilateral 01/17/2019   Procedure: BILATERAL NIPPLE SPARING MASTECTOMIES WITH LEFT SENTINEL LYMPH NODE MAPPING;  Surgeon: [name], MD;  Location: [facility];  Service: General;  Laterality: Bilateral;   REMOVAL OF BILATERAL TISSUE EXPANDERS WITH PLACEMENT OF BILATERAL BREAST IMPLANTS Bilateral 06/02/2019   Procedure: REMOVAL OF BILATERAL TISSUE EXPANDERS WITH PLACEMENT OF BILATERAL SILICONE BREAST IMPLANTS;  Surgeon: Glenna Fellows, MD;  Location: [facility];  Service: Plastics;  Laterality: Bilateral;   WISDOM TOOTH EXTRACTION  ALLERGIES:  Allergies  Allergen Reactions   Oxycodone Nausea Only    Severe nausea     CURRENT MEDICATIONS:  Outpatient Encounter Medications as of 04/14/2023  Medication Sig   cyanocobalamin 100 MCG tablet Take 100 mcg by mouth daily. (Patient not taking: Reported on 01/15/2023)   magnesium 30 MG tablet Take 30 mg by mouth 2 (two) times daily.   meclizine (ANTIVERT) 25 MG tablet Take 25 mg by mouth  3 (three) times daily as needed (Vertigo/Motion Sickness). (Patient not taking: Reported on 01/15/2023)   tamoxifen (NOLVADEX) 20 MG tablet Take 1 tablet (20 mg total) by mouth daily.   No facility-administered encounter medications on file as of 04/14/2023.     ONCOLOGIC FAMILY HISTORY:  Family History  Problem Relation Age of Onset   Hypertension Mother    Heart disease Father    Breast cancer Maternal Aunt 21 - 69   Breast cancer Maternal Aunt 77 - 59   Lung cancer Paternal Aunt        diagnosed late 38s   Breast cancer Maternal Grandmother 40 - 69   Breast cancer Paternal Grandmother 81 - 12   Lung cancer Paternal Grandmother        thought to be breast cancer mets   Rectal cancer Paternal Grandmother        diagnosed 72s   Lung cancer Paternal Grandfather        diagnosed 79s     SOCIAL HISTORY:  Social History   Socioeconomic History   Marital status: Married    Spouse name: Not on file   Number of children: Not on file   Years of education: Not on file   Highest education level: Not on file  Occupational History   Not on file  Tobacco Use   Smoking status: Never   Smokeless tobacco: Never  Vaping Use   Vaping status: Never Used  Substance and Sexual Activity   Alcohol use: Yes    Comment: occasional   Drug use: Never   Sexual activity: Yes    Birth control/protection: None  Other Topics Concern   Not on file  Social History Narrative   Not on file   Social Drivers of Health   Financial Resource Strain: Not on file  Food Insecurity: No Food Insecurity (05/25/2022)   Hunger Vital Sign    Worried About Running Out of Food in the Last Year: Never true    Ran Out of Food in the Last Year: Never true  Transportation Needs: No Transportation Needs (05/25/2022)   PRAPARE - Administrator, Civil Service (Medical): No    Lack of Transportation (Non-Medical): No  Physical Activity: Not on file  Stress: Not on file  Social Connections: Not on file   Intimate Partner Violence: Not At Risk (05/25/2022)   Humiliation, Afraid, Rape, and Kick questionnaire    Fear of Current or Ex-Partner: No    Emotionally Abused: No    Physically Abused: No    Sexually Abused: No     OBSERVATIONS/OBJECTIVE:  There were no vitals taken for this visit. GENERAL: Patient is a well appearing female in no acute distress HEENT:  Sclerae anicteric.  Oropharynx clear and moist. No ulcerations or evidence of oropharyngeal candidiasis. Neck is supple.  NODES:  No cervical, supraclavicular, or axillary lymphadenopathy palpated.  BREAST EXAM: Status post bilateral mastectomies with reconstruction, left breast status post radiation and excision, no sign of local recurrence LUNGS:  Clear to auscultation bilaterally.  No wheezes or rhonchi. HEART:  Regular rate and rhythm. No murmur appreciated. ABDOMEN:  Soft, nontender.  Positive, normoactive bowel sounds. No organomegaly palpated. MSK:  No focal spinal tenderness to palpation. Full range of motion bilaterally in the upper extremities. EXTREMITIES:  No peripheral edema.   SKIN:  Clear with no obvious rashes or skin changes. No nail dyscrasia. NEURO:  Nonfocal. Well oriented.  Appropriate affect.   LABORATORY DATA:  None for this visit.  DIAGNOSTIC IMAGING:  None for this visit.      ASSESSMENT AND PLAN:  Ms.. Folden is a pleasant 38 y.o. female with Stage IA left breast invasive ductal carcinoma, ER+/PR+/HER2+, diagnosed in 10/2021, treated with neoadjuvant chemotherapy, lumpectomy, maintenance Herceptin/Perjeta, adjuvant radiation therapy, and anti-estrogen therapy with Tamoxifen beginning in 08/2022.  She presents to the Survivorship Clinic for our initial meeting and routine follow-up post-completion of treatment for breast cancer.    1. Stage IA left breast cancer:  Ms. Suchocki is continuing to recover from definitive treatment for breast cancer. She will follow-up with her medical oncologist, Dr. Al Pimple in  05/2023 with history and physical exam per surveillance protocol.  She will continue her anti-estrogen therapy with Tamoxifen.  Scheduled for breast MRI in March 2025.  Today, a comprehensive survivorship care plan and treatment summary was reviewed with the patient today detailing her breast cancer diagnosis, treatment course, potential late/long-term effects of treatment, appropriate follow-up care with recommendations for the future, and patient education resources.  A copy of this summary, along with a letter will be sent to the patient's primary care provider via mail/fax/In Basket message after today's visit.    2.  Itching: I recommended that she use Cetaphil cream and see if this helps.  We also discussed potentially restarting Zyrtec  3.  Muscle cramps: I suggested she use echo electrolyte replacement since she is an active exerciser.  4. Bone health:    She was given education on specific activities to promote bone health.  5. Cancer screening:  Due to Ms. Tawney's history and her age, she should receive screening for skin cancers, colon cancer (when due), and gynecologic cancers.  The information and recommendations are listed on the patient's comprehensive care plan/treatment summary and were reviewed in detail with the patient.    6. Health maintenance and wellness promotion: Ms. Granados was encouraged to consume 5-7 servings of fruits and vegetables per day. We reviewed the pillars of health from the [employer] of [employer]estyle [employer].  She was also encouraged to engage in moderate to vigorous exercise for 30 minutes per day most days of the week.  She was instructed to limit her alcohol consumption and continue to abstain from tobacco use.     7. Support services/counseling: It is not uncommon for this period of the patient's cancer care trajectory to be one of many emotions and stressors.   She was given information regarding our available services and encouraged to contact me with any  questions or for help enrolling in any of our support group/programs.    Follow up instructions:    -Return to cancer center in May 2025 for follow-up with Dr. Al Pimple -Breast MRI in March 2025 -Continue Guardant testing every 6 months -She is welcome to return back to the Survivorship Clinic at any time; no additional follow-up needed at this time.  -Consider referral back to survivorship as a long-term survivor for continued surveillance  The patient was provided an opportunity to ask questions and all were answered. The  patient agreed with the plan and demonstrated an understanding of the instructions.   Total encounter time:50 minutes*in face-to-face visit time, chart review, lab review, care coordination, order entry, and documentation of the encounter time.    *Total Encounter Time as defined by the Centers for Medicare and Medicaid Services includes, in addition to the face-to-face time of a patient visit (documented in the note above) non-face-to-face time: obtaining and reviewing outside history, ordering and reviewing medications, tests or procedures, care coordination (communications with other health care professionals or caregivers) and documentation in the medical record.

## 2023-04-13 NOTE — Telephone Encounter (Signed)
 Left message on voicemail about appointment on 04/14/23

## 2023-04-14 ENCOUNTER — Inpatient Hospital Stay: Attending: Hematology and Oncology | Admitting: Hematology and Oncology

## 2023-04-14 VITALS — BP 111/68 | HR 70 | Temp 97.8°F | Resp 16 | Wt 153.8 lb

## 2023-04-14 DIAGNOSIS — Z923 Personal history of irradiation: Secondary | ICD-10-CM | POA: Insufficient documentation

## 2023-04-14 DIAGNOSIS — Z1732 Human epidermal growth factor receptor 2 negative status: Secondary | ICD-10-CM | POA: Insufficient documentation

## 2023-04-14 DIAGNOSIS — C50412 Malignant neoplasm of upper-outer quadrant of left female breast: Secondary | ICD-10-CM | POA: Diagnosis not present

## 2023-04-14 DIAGNOSIS — Z1722 Progesterone receptor negative status: Secondary | ICD-10-CM | POA: Diagnosis not present

## 2023-04-14 DIAGNOSIS — Z79810 Long term (current) use of selective estrogen receptor modulators (SERMs): Secondary | ICD-10-CM | POA: Diagnosis not present

## 2023-04-14 DIAGNOSIS — Z170 Estrogen receptor positive status [ER+]: Secondary | ICD-10-CM | POA: Diagnosis not present

## 2023-04-20 ENCOUNTER — Telehealth: Payer: Self-pay | Admitting: Hematology and Oncology

## 2023-04-20 NOTE — Telephone Encounter (Signed)
 Left vm about rescheduled appt date and time.

## 2023-04-21 ENCOUNTER — Other Ambulatory Visit: Payer: Self-pay

## 2023-05-10 ENCOUNTER — Ambulatory Visit: Payer: BC Managed Care – PPO

## 2023-05-24 ENCOUNTER — Ambulatory Visit: Attending: Hematology and Oncology

## 2023-05-24 VITALS — Wt 152.4 lb

## 2023-05-24 DIAGNOSIS — Z483 Aftercare following surgery for neoplasm: Secondary | ICD-10-CM | POA: Insufficient documentation

## 2023-05-24 NOTE — Therapy (Signed)
 OUTPATIENT PHYSICAL THERAPY SOZO SCREENING NOTE   Patient Name: Stephanie [name] MRN: [id number] DOB:1985-09-19, 38 y.o., female Today's Date: 05/24/2023  PCP: Orest Bio, MD REFERRING PROVIDER: Murleen Arms, MD   PT End of Session - 05/24/23 1914     Visit Number 4   # unchanged due to screen only   PT Start Time 0827    PT Stop Time 0834    PT Time Calculation (min) 7 min    Activity Tolerance Patient tolerated treatment well    Behavior During Therapy [facility] for tasks assessed/performed             Past Medical History:  Diagnosis Date   Atypical mole 03/14/2019   mod-left side superior   BRCA gene mutation negative in female    Breast cancer [facility])    Family history of breast cancer    Family history of lung cancer    Family history of rectal cancer    Melanoma (HCC) 10/14/2007   level II- right arm- (EXC)   PONV (postoperative nausea and vomiting)    Past Surgical History:  Procedure Laterality Date   BREAST BIOPSY Left 04/29/2022   US  LT RADIOACTIVE SEED LOC 04/29/2022 [facility] MAMMOGRAPHY   BREAST LUMPECTOMY WITH RADIOACTIVE SEED AND SENTINEL LYMPH NODE BIOPSY Left 04/30/2022   Procedure: LEFT BREAST LUMPECTOMY WITH RADIOACTIVE SEED AND SENTINEL LYMPH NODE BIOPSY;  Surgeon: Sim Dryer, MD;  Location: [facility];  Service: General;  Laterality: Left;   BREAST RECONSTRUCTION WITH PLACEMENT OF TISSUE EXPANDER AND ALLODERM Bilateral 01/17/2019   Procedure: BILATERAL BREAST RECONSTRUCTION WITH PLACEMENT OF TISSUE EXPANDER AND ALLODERM;  Surgeon: Alger Infield, MD;  Location: [facility];  Service: Plastics;  Laterality: Bilateral;   IR IMAGING GUIDED PORT INSERTION  11/25/2021   IR RADIOLOGIST EVAL & MGMT  11/26/2021   IR REMOVAL TUN ACCESS W/ PORT W/O FL MOD SED  12/09/2022   LESION REMOVAL Left 06/02/2019   Procedure: MINOR EXICISION OF LESION,LAYERED CLOSURE 1 CM;  Surgeon: Alger Infield, MD;  Location: [facility];  Service: Plastics;  Laterality: Left;   LIPOSUCTION WITH LIPOFILLING Bilateral 06/02/2019   Procedure: LIPOFILLING FROM ABDOMEN TO BILATERAL CHEST;  Surgeon: Alger Infield, MD;  Location: [facility];  Service: Plastics;  Laterality: Bilateral;   melanoma surgery Right 2009   upper right arm   NIPPLE SPARING MASTECTOMY WITH SENTINEL LYMPH NODE BIOPSY Bilateral 01/17/2019   Procedure: BILATERAL NIPPLE SPARING MASTECTOMIES WITH LEFT SENTINEL LYMPH NODE MAPPING;  Surgeon: Sim Dryer, MD;  Location: [facility];  Service: General;  Laterality: Bilateral;   REMOVAL OF BILATERAL TISSUE EXPANDERS WITH PLACEMENT OF BILATERAL BREAST IMPLANTS Bilateral 06/02/2019   Procedure: REMOVAL OF BILATERAL TISSUE EXPANDERS WITH PLACEMENT OF BILATERAL SILICONE BREAST IMPLANTS;  Surgeon: Alger Infield, MD;  Location: 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 No radiation completed. Pt is now done with [facility] and had a left lumpectomy with SLNB on 04/30/22 with 1 negative node removed for a total of 5 removed. Completed radiation.   PRECAUTIONS:  left UE Lymphedema risk, None  SUBJECTIVE: Pt returns for her 3 month L-Dex screen . "I've been working out more and I've started noticing some tightness down my Lt arm after. It feels like a line that runs up from my wrist towards my axilla. I was thinking about having some PT for it."  PAIN:  Are you having pain? No.  SOZO SCREENING: Patient was assessed today using the SOZO machine to determine the lymphedema index score. This was compared to her baseline score. It was determined that she is within the recommended range when compared to her baseline and no further action is needed at this time. She will continue SOZO screenings. These are done every 3 months for 2 years post operatively followed by every 6 months for 2 years, and then annually.   Patient reported a change in status to PTA (sounds like she is beginning to develop cording and reports recent increased tightness in Lt axilla) which initiated the PTA consulting with a PT. PT determined it would be appropriate to initiate therapy at this time. PT requested a referral from patient's provider.    L-DEX FLOWSHEETS - 05/24/23 0800       L-DEX LYMPHEDEMA SCREENING   Measurement Type Unilateral    L-DEX MEASUREMENT EXTREMITY Upper Extremity    POSITION  Standing    DOMINANT SIDE Right    At Risk Side Left    BASELINE SCORE (UNILATERAL) 3.2    L-DEX SCORE (UNILATERAL) 7.6    VALUE CHANGE (UNILAT) 4.4               Denyce Flank, PTA 05/24/2023, 9:13 AM

## 2023-05-25 ENCOUNTER — Other Ambulatory Visit: Payer: Self-pay

## 2023-05-25 ENCOUNTER — Ambulatory Visit: Payer: BC Managed Care – PPO | Admitting: Hematology and Oncology

## 2023-05-31 ENCOUNTER — Other Ambulatory Visit: Payer: Self-pay | Admitting: Hematology and Oncology

## 2023-05-31 DIAGNOSIS — C50412 Malignant neoplasm of upper-outer quadrant of left female breast: Secondary | ICD-10-CM

## 2023-05-31 NOTE — Progress Notes (Signed)
 Referral placed to Physical therapy based on recommendations from our PT team.

## 2023-05-31 NOTE — Therapy (Signed)
 OUTPATIENT PHYSICAL THERAPY  UPPER EXTREMITY ONCOLOGY EVALUATION  Patient Name: Stephanie [name] MRN: [id number] DOB:September 16, 1985, 38 y.o., female Today's Date: 06/01/2023  END OF SESSION:  PT End of Session - 06/01/23 0948     Visit Number 1    Number of Visits 6    Date for PT Re-Evaluation 08/24/23    Authorization Type needed    PT Start Time 0902    PT Stop Time 0945    PT Time Calculation (min) 43 min    Activity Tolerance Patient tolerated treatment well    Behavior During Therapy [facility] for tasks assessed/performed             Past Medical History:  Diagnosis Date   Atypical mole 03/14/2019   mod-left side superior   BRCA gene mutation negative in female    Breast cancer [facility])    Family history of breast cancer    Family history of lung cancer    Family history of rectal cancer    Melanoma (HCC) 10/14/2007   level II- right arm- (EXC)   PONV (postoperative nausea and vomiting)    Past Surgical History:  Procedure Laterality Date   BREAST BIOPSY Left 04/29/2022   US  LT RADIOACTIVE SEED LOC 04/29/2022 [facility] MAMMOGRAPHY   BREAST LUMPECTOMY WITH RADIOACTIVE SEED AND SENTINEL LYMPH NODE BIOPSY Left 04/30/2022   Procedure: LEFT BREAST LUMPECTOMY WITH RADIOACTIVE SEED AND SENTINEL LYMPH NODE BIOPSY;  Surgeon: Sim Dryer, MD;  Location: [facility];  Service: General;  Laterality: Left;   BREAST RECONSTRUCTION WITH PLACEMENT OF TISSUE EXPANDER AND ALLODERM Bilateral 01/17/2019   Procedure: BILATERAL BREAST RECONSTRUCTION WITH PLACEMENT OF TISSUE EXPANDER AND ALLODERM;  Surgeon: Alger Infield, MD;  Location: [facility];  Service: Plastics;  Laterality: Bilateral;   IR IMAGING GUIDED PORT INSERTION  11/25/2021   IR RADIOLOGIST EVAL & MGMT  11/26/2021   IR REMOVAL TUN ACCESS W/ PORT W/O FL MOD SED  12/09/2022   LESION REMOVAL Left 06/02/2019   Procedure: MINOR EXICISION OF LESION,LAYERED CLOSURE 1 CM;  Surgeon: Alger Infield, MD;   Location: [facility];  Service: Plastics;  Laterality: Left;   LIPOSUCTION WITH LIPOFILLING Bilateral 06/02/2019   Procedure: LIPOFILLING FROM ABDOMEN TO BILATERAL CHEST;  Surgeon: Alger Infield, MD;  Location: [facility];  Service: Plastics;  Laterality: Bilateral;   melanoma surgery Right 2009   upper right arm   NIPPLE SPARING MASTECTOMY WITH SENTINEL LYMPH NODE BIOPSY Bilateral 01/17/2019   Procedure: BILATERAL NIPPLE SPARING MASTECTOMIES WITH LEFT SENTINEL LYMPH NODE MAPPING;  Surgeon: Sim Dryer, MD;  Location: [facility];  Service: General;  Laterality: Bilateral;   REMOVAL OF BILATERAL TISSUE EXPANDERS WITH PLACEMENT OF BILATERAL BREAST IMPLANTS Bilateral 06/02/2019   Procedure: REMOVAL OF BILATERAL TISSUE EXPANDERS WITH PLACEMENT OF BILATERAL SILICONE BREAST IMPLANTS;  Surgeon: Alger Infield, MD;  Location: [facility];  Service: Plastics;  Laterality: Bilateral;   WISDOM TOOTH EXTRACTION     Patient Active Problem List   Diagnosis Date Noted   Port-A-Cath in place 05/14/2022   Malignant neoplasm of upper-outer quadrant of left breast in female, estrogen receptor positive (HCC) 11/18/2021   Family history of breast cancer    Family history of rectal cancer    Family history of lung cancer    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   Genetic testing 11/03/2018   Hip pain, acute, left 07/30/2016   Neck pain 08/10/2011   Musculoskeletal  pain 08/10/2011    PCP: Alanna Hu, MD  REFERRING PROVIDER: Murleen Arms, MD  REFERRING DIAG:  Diagnosis  C50.412,Z17.0 (ICD-10-CM) - Malignant neoplasm of upper-outer quadrant of left breast in female, estrogen receptor positive (HCC)    THERAPY DIAG:  Aftercare following surgery for neoplasm  Malignant neoplasm of upper-outer quadrant of left breast in female, estrogen receptor positive (HCC)  At risk for lymphedema  Abnormal posture  ONSET DATE:  10/2021  Rationale for Evaluation and Treatment: Rehabilitation  SUBJECTIVE:                                                                                                                                                                                           SUBJECTIVE STATEMENT: I feel like I am having some changes in my arm.  I think I may be a little puffy at times.  I also get some tight soreness up the arm.  I have been doing a lot of weight lifting and the left side has more of a soreness.  No history of cording.  I [name]'t exercise with a sleeve.  I do have one for flying.  I lift about 35# at this point.  Also doing planks, pull ups  PERTINENT HISTORY: treated for left breast cancer DCIS in 2020 where she underwent bilateral nipple sparing mastectomies with implants and Lt SLNB 4 negative nodes rermoved. No radiation completed. Then in 2023 with completion of chemotherapy, left lumpectomy with SLNB on 04/30/22 with 1 negative node removed for a total of 5 removed. Completed radiation.   PAIN:  Are you having pain? Yes - not currently.  I do get intermittent breast pains  The arm doesn't hurt really just an extreme soreness.   PRECAUTIONS: Left lymphedema risk   RED FLAGS: None   WEIGHT BEARING RESTRICTIONS: No  FALLS:  Has patient fallen in last 6 months? 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  And lift together 3-4x per week.    HAND DOMINANCE: right   PRIOR LEVEL OF FUNCTION: Independent  PATIENT GOALS: check out the left arm.     OBJECTIVE: Note: Objective measures were completed at Evaluation unless otherwise noted.  COGNITION: Overall cognitive status: Within functional limits for tasks assessed   PALPATION: No very evident cording even with skin stretch and in various positions but does feel like it sounds like cording subjectively  OBSERVATIONS / OTHER ASSESSMENTS: no edema noted    POSTURE: left pectoralis tightness and implant tightness  UPPER EXTREMITY AROM/PROM: WNL  L-DEX LYMPHEDEMA SCREENING: L-DEX LYMPHEDEMA SCREENING Measurement  Type: Unilateral L-DEX MEASUREMENT EXTREMITY: Upper Extremity POSITION : Standing DOMINANT SIDE: Right At Risk Side: Left BASELINE SCORE (UNILATERAL): 3.2 L-DEX SCORE (UNILATERAL): 8.6 VALUE CHANGE (UNILAT): 5.4  QUICK DASH SURVEY: 6.82%                                                                                                                            TREATMENT DATE:  06/01/23 Eval performed Supine education on self MLD of the Left UE with PT performing each step with cueing and handout given.  Omitted inguinal pathway for now.  Reviewed stretches which pt does a good job with already but needs to do more consistently Reviewed use of compression and SOZO score - recommended wearing her sleeve with all exercise at the least vs every day for the next few weeks.  She will get a new sleeve at [facility].      PATIENT EDUCATION:  Education details: per today's note Person educated: Patient Education method: Programmer, multimedia, Demonstration, Actor cues, Verbal cues, and Handouts Education comprehension: verbalized understanding  HOME EXERCISE PROGRAM: Start self MLD, use compression during exercise, stretch more  ASSESSMENT:  CLINICAL IMPRESSION: Patient is a 38 y.o. female who was seen today for physical therapy evaluation and treatment for her new onset of left arm symptoms including tightness and pulling and feeling puffy especially with exercise.  She teaches HIIT classes and works out / lifts heavy weights 3-4x per week including planks, pushups, chin ups, etc.  Does not have evident cording but it does sound like cording is present.  She has a sleeve already and will wear it and do MLD for the next 2 weeks and then we will recheck status. Her SOZO is still in the green but had increased 1 point since 2 weeks ago  and is elevated at 5.6.    OBJECTIVE IMPAIRMENTS: decreased activity tolerance, decreased knowledge of condition, and increased edema.   ACTIVITY LIMITATIONS: lifting  PARTICIPATION LIMITATIONS: community activity  PERSONAL FACTORS: 1-2 comorbidities: SLNB, radiation are also affecting patient's functional outcome.   REHAB POTENTIAL: Excellent  CLINICAL DECISION MAKING: Stable/uncomplicated  EVALUATION COMPLEXITY: Low  GOALS: Goals reviewed with patient? Yes  SHORT TERM GOALS: Target date: 06/01/23  Pt will be educated on self MLD for the Lt UE Baseline: Goal status: MET   LONG TERM GOALS: Target date: 08/24/23  Pt will use compression for the UE as needed to maintain SOZO in the green normal zone Baseline:  Goal status: INITIAL  2.  Pt will be independent with self MLD for the arm and educated on the pump if needed Baseline:  Goal status: INITIAL  3.  Pt will be educated on activity/exercise modification as needed to control symptoms  Baseline:  Goal status: MET  PLAN:  PT FREQUENCY: every 2 weeks   PT DURATION: 12 weeks  PLANNED INTERVENTIONS: [id number]- PT Re-evaluation, 97110-Therapeutic exercises, 97530- Therapeutic activity, [id number]- Neuromuscular re-education, 97535- Self Care, 82956- Manual therapy, Patient/Family education,  Therapeutic exercises, Therapeutic activity, Neuromuscular re-education, and Self Care  PLAN FOR NEXT SESSION: recheck SOZO, get new sleeve? Review self MLD in seated - schedule as needed   Encarnacion Harris, PT 06/01/2023, 9:50 AM

## 2023-05-31 NOTE — Progress Notes (Signed)
 BRIEF ONCOLOGIC HISTORY:  Oncology History  Malignant neoplasm of upper-outer quadrant of female breast (HCC) (Resolved)  10/20/2018 Initial Diagnosis   Malignant neoplasm of upper-outer quadrant of left breast in female, estrogen receptor positive (HCC)   10/20/2018 Cancer Staging   Staging form: Breast, AJCC 8th Edition - Clinical stage from 10/20/2018: Stage 0 (cTis (DCIS), cN0, cM0, ER+, PR-) - Signed by Willy Harvest, MD on 02/13/2020 Stage prefix: Initial diagnosis   11/03/2018 Genetic Testing   Negative genetic testing:  No pathogenic variants detected on the [facility] Multi-Cancer panel, ordered by Dr. Ricky Charter at [facility]. The report date is 11/03/2018.  The Multi-Cancer Panel offered by [facility] includes sequencing and/or deletion duplication testing of the following 84 genes: AIP, ALK, APC, ATM, AXIN2,BAP1,  BARD1, BLM, BMPR1A, BRCA1, BRCA2, BRIP1, CASR, CDC73, CDH1, CDK4, CDKN1B, CDKN1C, CDKN2A (p14ARF), CDKN2A (p16INK4a), CEBPA, CHEK2, CTNNA1, DICER1, DIS3L2, EGFR (c.2369C>T, p.Thr790Met variant only), EPCAM (Deletion/duplication testing only), FH, FLCN, GATA2, GPC3, GREM1 (Promoter region deletion/duplication testing only), HOXB13 (c.251G>A, p.Gly84Glu), HRAS, KIT, MAX, MEN1, MET, MITF (c.952G>A, p.Glu318Lys variant only), MLH1, MSH2, MSH3, MSH6, MUTYH, NBN, NF1, NF2, NTHL1, PALB2, PDGFRA, PHOX2B, PMS2, POLD1, POLE, POT1, PRKAR1A, PTCH1, PTEN, RAD50, RAD51C, RAD51D, RB1, RECQL4, RET, RUNX1, SDHAF2, SDHA (sequence changes only), SDHB, SDHC, SDHD, SMAD4, SMARCA4, SMARCB1, SMARCE1, STK11, SUFU, TERC, TERT, TMEM127, TP53, TSC1, TSC2, VHL, WRN and WT1.     01/17/2019 Cancer Staging   Staging form: Breast, AJCC 8th Edition - Pathologic stage from 01/17/2019: Stage IA (pT27mi, pN0, cM0, G2, ER+, PR-, HER2-) - Signed by Percival Brace, NP on 02/01/2019   07/01/2022 - 08/11/2022 Radiation Therapy   Plan Name: CW_L_BH_BO Site: Chest Wall, Left Technique: 3D Mode:  Photon Dose Per Fraction: 1.8 Gy Prescribed Dose (Delivered / Prescribed): 25.2 Gy / 25.2 Gy Prescribed Fxs (Delivered / Prescribed): 14 / 14   Plan Name: CW_L_BH Site: Chest Wall, Left Technique: 3D Mode: Photon Dose Per Fraction: 1.8 Gy Prescribed Dose (Delivered / Prescribed): 25.2 Gy / 25.2 Gy Prescribed Fxs (Delivered / Prescribed): 14 / 14   08/2022 -  Anti-estrogen oral therapy   Tamoxifen    Malignant neoplasm of upper-outer quadrant of left breast in female, estrogen receptor positive (HCC)  10/21/2021 Breast US    Breast ultrasound of the palpable mass showed hypoechoic oval vascular mass with indistinct margins measuring 1.5 x 1 cm x 1.4 cm.  No abnormal lymph nodes found in the left axilla.  She had ultrasound-guided biopsy.  MRI scheduled for tomorrow.   10/29/2021 Pathology Results   Surgical pathology from the left breast needle core biopsy at 130 o'clock showed invasive ductal carcinoma, overall grade 2, prognostics ER 70% positive weak to moderate staining, PR 0% negative, Ki-67 of 80% and HER2 3+   11/18/2021 Cancer Staging   Staging form: Breast, AJCC 8th Edition - Clinical: Stage IA (cT1c, cN0, cM0, G2, ER+, PR-, HER2+) - Signed by Murleen Arms, MD on 11/18/2021 Histologic grading system: 3 grade system   11/26/2021 - 03/16/2022 Chemotherapy   Patient is on Treatment Plan : BREAST  Docetaxel  + Carboplatin  + Trastuzumab  + Pertuzumab   (TCHP) q21d      04/03/2022 - 11/27/2022 Chemotherapy   Patient is on Treatment Plan : BREAST Trastuzumab  + Pertuzumab  q21d x 11 cycles     04/30/2022 Surgery   Left breast lumpectomy: no residual cancer identified, margins negative, treatment effect present, 1SLN negative for cancer.   06/24/2022 - 08/03/2022 Radiation Therapy   Adjuvant radiation therapy  08/2022 -  Anti-estrogen oral therapy   Tamoxifen  daily     INTERVAL HISTORY:   Discussed the use of AI scribe software for clinical note transcription with the patient, who  gave verbal consent to proceed.  History of Present Illness  Stephanie [name] is a 38 year old female with breast cancer who presents for follow-up regarding tamoxifen  side effects and recent gynecological issues.  She has been experiencing side effects from tamoxifen , including leg cramps, fatigue, and cognitive difficulties. The leg cramps predominantly occur at night and have shown some improvement with magnesium  and potassium supplementation. The fatigue is severe, making her feel like she could 'fall asleep standing up' during the day. Cognitive difficulties have worsened since starting tamoxifen  in August 2024. She has been on tamoxifen  for nine months and is considering reducing the dose due to these side effects.  She has not menstruated since starting tamoxifen  but experienced some bleeding, which led to a visit to her gynecologist. An ultrasound and a dilation and curettage (D&C) were performed due to a suspected polyp, but no polyp was found. A biopsy was conducted, and results were normal. There has been no further bleeding since the procedure.  She completed chemotherapy in March 2024 and is concerned about the impact of chemotherapy and tamoxifen  on her body. She reports a recent increase in heart rate during high-intensity interval training, reaching up to 170-180 bpm, which is higher than her previous experiences. Her resting heart rate is 69 bpm, and she has a history of low blood pressure since chemotherapy. She has been engaging in more strength training and less cardiovascular exercise recently.  She underwent a lymphedema evaluation and was advised to wear a compression sleeve during workouts due to experiencing tightness and soreness in her arm. She has been shown lymphatic massage techniques to perform at home.  She is awaiting results from a Gardent test conducted on May 21, 2023, and is interested in participating in a study related to cognitive exercises for 'chemo brain'  conducted by [employer].  Rest of the pertinent ROS reviewed and neg    PAST MEDICAL/SURGICAL HISTORY:  Past Medical History:  Diagnosis Date   Atypical mole 03/14/2019   mod-left side superior   BRCA gene mutation negative in female    Breast cancer [facility])    Family history of breast cancer    Family history of lung cancer    Family history of rectal cancer    Melanoma (HCC) 10/14/2007   level II- right arm- (EXC)   PONV (postoperative nausea and vomiting)    Past Surgical History:  Procedure Laterality Date   BREAST BIOPSY Left 04/29/2022   US  LT RADIOACTIVE SEED LOC 04/29/2022 [facility] MAMMOGRAPHY   BREAST LUMPECTOMY WITH RADIOACTIVE SEED AND SENTINEL LYMPH NODE BIOPSY Left 04/30/2022   Procedure: LEFT BREAST LUMPECTOMY WITH RADIOACTIVE SEED AND SENTINEL LYMPH NODE BIOPSY;  Surgeon: Sim Dryer, MD;  Location: [facility];  Service: General;  Laterality: Left;   BREAST RECONSTRUCTION WITH PLACEMENT OF TISSUE EXPANDER AND ALLODERM Bilateral 01/17/2019   Procedure: BILATERAL BREAST RECONSTRUCTION WITH PLACEMENT OF TISSUE EXPANDER AND ALLODERM;  Surgeon: Alger Infield, MD;  Location: [facility];  Service: Plastics;  Laterality: Bilateral;   IR IMAGING GUIDED PORT INSERTION  11/25/2021   IR RADIOLOGIST EVAL & MGMT  11/26/2021   IR REMOVAL TUN ACCESS W/ PORT W/O FL MOD SED  12/09/2022   LESION REMOVAL Left 06/02/2019   Procedure: MINOR EXICISION OF LESION,LAYERED  CLOSURE 1 CM;  Surgeon: Alger Infield, MD;  Location: [facility];  Service: Plastics;  Laterality: Left;   LIPOSUCTION WITH LIPOFILLING Bilateral 06/02/2019   Procedure: LIPOFILLING FROM ABDOMEN TO BILATERAL CHEST;  Surgeon: Alger Infield, MD;  Location: [facility];  Service: Plastics;  Laterality: Bilateral;   melanoma surgery Right 2009   upper right arm   NIPPLE SPARING MASTECTOMY WITH SENTINEL LYMPH NODE BIOPSY Bilateral 01/17/2019   Procedure:  BILATERAL NIPPLE SPARING MASTECTOMIES WITH LEFT SENTINEL LYMPH NODE MAPPING;  Surgeon: Sim Dryer, MD;  Location: [facility];  Service: General;  Laterality: Bilateral;   REMOVAL OF BILATERAL TISSUE EXPANDERS WITH PLACEMENT OF BILATERAL BREAST IMPLANTS Bilateral 06/02/2019   Procedure: REMOVAL OF BILATERAL TISSUE EXPANDERS WITH PLACEMENT OF BILATERAL SILICONE BREAST IMPLANTS;  Surgeon: Alger Infield, MD;  Location:  [facility];  Service: Plastics;  Laterality: Bilateral;   WISDOM TOOTH EXTRACTION       ALLERGIES:  Allergies  Allergen Reactions   Oxycodone  Nausea Only    Severe nausea     CURRENT MEDICATIONS:  Outpatient Encounter Medications as of 06/01/2023  Medication Sig   cyanocobalamin 100 MCG tablet Take 100 mcg by mouth daily.   magnesium  30 MG tablet Take 30 mg by mouth 2 (two) times daily.   meclizine  (ANTIVERT ) 25 MG tablet Take 25 mg by mouth 3 (three) times daily as needed (Vertigo/Motion Sickness).   tamoxifen  (NOLVADEX ) 20 MG tablet Take 1 tablet (20 mg total) by mouth daily.   No facility-administered encounter medications on file as of 06/01/2023.     ONCOLOGIC FAMILY HISTORY:  Family History  Problem Relation Age of Onset   Hypertension Mother    Heart disease Father    Breast cancer Maternal Aunt 72 - 69   Breast cancer Maternal Aunt 45 - 59   Lung cancer Paternal Aunt        diagnosed late 16s   Breast cancer Maternal Grandmother 33 - 69   Breast cancer Paternal Grandmother 31 - 11   Lung cancer Paternal Grandmother        thought to be breast cancer mets   Rectal cancer Paternal Grandmother        diagnosed 36s   Lung cancer Paternal Grandfather        diagnosed 90s     SOCIAL HISTORY:  Social History   Socioeconomic History   Marital status: Married    Spouse name: Not on file   Number of children: Not on file   Years of education: Not on file   Highest education level: Not on file  Occupational History    Not on file  Tobacco Use   Smoking status: Never   Smokeless tobacco: Never  Vaping Use   Vaping status: Never Used  Substance and Sexual Activity   Alcohol use: Yes    Comment: occasional   Drug use: Never   Sexual activity: Yes    Birth control/protection: None  Other Topics Concern   Not on file  Social History Narrative   Not on file   Social Drivers of Health   Financial Resource Strain: Not on file  Food Insecurity: No Food Insecurity (05/25/2022)   Hunger Vital Sign    Worried About Running Out of Food in the Last Year: Never true    Ran Out of Food in the Last Year: Never true  Transportation Needs: No Transportation Needs (05/25/2022)   PRAPARE - Transportation    Lack of  Transportation (Medical): No    Lack of Transportation (Non-Medical): No  Physical Activity: Not on file  Stress: Not on file  Social Connections: Not on file  Intimate Partner Violence: Not At Risk (05/25/2022)   Humiliation, Afraid, Rape, and Kick questionnaire    Fear of Current or Ex-Partner: No    Emotionally Abused: No    Physically Abused: No    Sexually Abused: No     OBSERVATIONS/OBJECTIVE:  BP (!) 109/57 (BP Location: Left Arm, Patient Position: Sitting) Comment: MD notified  Pulse 69   Temp 98.7 F (37.1 C) (Temporal)   Resp 17   Wt 151 lb 14.4 oz (68.9 kg)   SpO2 100%   BMI 24.52 kg/m  GENERAL: Patient is a well appearing female in no acute distress No cervical adenopathy Bilateral breasts palpated. In the left breast there appears to be discomfort at the edge of the implant with no obvious palpable mass. No regional adenopathy  LABORATORY DATA:  None for this visit.  DIAGNOSTIC IMAGING:  None for this visit.      ASSESSMENT AND PLAN:  Ms.. Talley is a pleasant 38 y.o. female with Stage IA left breast invasive ductal carcinoma, ER+/PR+/HER2+, diagnosed in 10/2021, treated with neoadjuvant chemotherapy, lumpectomy, maintenance Herceptin /Perjeta , adjuvant radiation therapy,  and anti-estrogen therapy with Tamoxifen  beginning in 08/2022.   Assessment & Plan Breast cancer, estrogen receptor positive Nine months into tamoxifen  therapy post-chemotherapy. Significant side effects impacting quality of life. Discussed dose reduction to 10 mg daily, likely still very effective. She was even considering stopping it. - Reduce tamoxifen  dose to 10 mg daily for a couple of weeks to assess tolerability. - Most recent guardant results neg, no detectable disease,  Adverse effects of tamoxifen  Leg cramps, fatigue, and cognitive impairment likely due to tamoxifen  and residual chemotherapy effects. Considered dose reduction. Discussed cognitive exercises study for 'chemo brain'. - Consider participation in [employer]'s remote study on cognitive exercises for 'chemo brain' if still open.  Lymphedema Tightness and soreness in left arm suggestive of lymphedema. Recommended compression sleeve and lymphatic massage. - Wear a compression sleeve during workouts. - Perform lymphatic massage at home as instructed.  Chemotherapy Completed chemotherapy with residual effects on ovarian function and cardiovascular fitness. Plan to assess ovarian reserves. - Order anti-Mullerian hormone test to assess ovarian reserves. - Encourage gradual reconditioning with less intensive, more frequent exercise sessions.  Tachycardia Elevated heart rate during high-intensity interval training. Potential causes include deconditioning and anemia. Resting heart rate normal, blood pressure low but healthy. - Check hemoglobin levels to assess for anemia. - Encourage gradual reconditioning with less intensive, more frequent exercise sessions.   Time spent: 40 min *Total Encounter Time as defined by the Centers for Medicare and Medicaid Services includes, in addition to the face-to-face time of a patient visit (documented in the note above) non-face-to-face time: obtaining and reviewing outside  history, ordering and reviewing medications, tests or procedures, care coordination (communications with other health care professionals or caregivers) and documentation in the medical record.

## 2023-06-01 ENCOUNTER — Encounter: Payer: Self-pay | Admitting: Rehabilitation

## 2023-06-01 ENCOUNTER — Ambulatory Visit: Attending: Hematology and Oncology | Admitting: Rehabilitation

## 2023-06-01 ENCOUNTER — Inpatient Hospital Stay

## 2023-06-01 ENCOUNTER — Inpatient Hospital Stay: Attending: Hematology and Oncology | Admitting: Hematology and Oncology

## 2023-06-01 VITALS — BP 109/57 | HR 69 | Temp 98.7°F | Resp 17 | Wt 151.9 lb

## 2023-06-01 DIAGNOSIS — N951 Menopausal and female climacteric states: Secondary | ICD-10-CM

## 2023-06-01 DIAGNOSIS — R293 Abnormal posture: Secondary | ICD-10-CM | POA: Insufficient documentation

## 2023-06-01 DIAGNOSIS — Z79810 Long term (current) use of selective estrogen receptor modulators (SERMs): Secondary | ICD-10-CM | POA: Diagnosis not present

## 2023-06-01 DIAGNOSIS — Z170 Estrogen receptor positive status [ER+]: Secondary | ICD-10-CM | POA: Diagnosis present

## 2023-06-01 DIAGNOSIS — I890 Lymphedema, not elsewhere classified: Secondary | ICD-10-CM | POA: Insufficient documentation

## 2023-06-01 DIAGNOSIS — T451X5A Adverse effect of antineoplastic and immunosuppressive drugs, initial encounter: Secondary | ICD-10-CM | POA: Diagnosis not present

## 2023-06-01 DIAGNOSIS — C50412 Malignant neoplasm of upper-outer quadrant of left female breast: Secondary | ICD-10-CM | POA: Diagnosis present

## 2023-06-01 DIAGNOSIS — Z9189 Other specified personal risk factors, not elsewhere classified: Secondary | ICD-10-CM | POA: Insufficient documentation

## 2023-06-01 DIAGNOSIS — Z483 Aftercare following surgery for neoplasm: Secondary | ICD-10-CM | POA: Insufficient documentation

## 2023-06-01 DIAGNOSIS — E2839 Other primary ovarian failure: Secondary | ICD-10-CM | POA: Diagnosis not present

## 2023-06-01 LAB — CBC WITH DIFFERENTIAL/PLATELET
Abs Immature Granulocytes: 0.02 10*3/uL (ref 0.00–0.07)
Basophils Absolute: 0.1 10*3/uL (ref 0.0–0.1)
Basophils Relative: 1 %
Eosinophils Absolute: 0 10*3/uL (ref 0.0–0.5)
Eosinophils Relative: 0 %
HCT: 39.9 % (ref 36.0–46.0)
Hemoglobin: 13.1 g/dL (ref 12.0–15.0)
Immature Granulocytes: 0 %
Lymphocytes Relative: 21 %
Lymphs Abs: 1.8 10*3/uL (ref 0.7–4.0)
MCH: 26.4 pg (ref 26.0–34.0)
MCHC: 32.8 g/dL (ref 30.0–36.0)
MCV: 80.3 fL (ref 80.0–100.0)
Monocytes Absolute: 0.4 10*3/uL (ref 0.1–1.0)
Monocytes Relative: 5 %
Neutro Abs: 6.6 10*3/uL (ref 1.7–7.7)
Neutrophils Relative %: 73 %
Platelets: 285 10*3/uL (ref 150–400)
RBC: 4.97 MIL/uL (ref 3.87–5.11)
RDW: 14.1 % (ref 11.5–15.5)
WBC: 8.9 10*3/uL (ref 4.0–10.5)
nRBC: 0 % (ref 0.0–0.2)

## 2023-06-01 LAB — CMP (CANCER CENTER ONLY)
ALT: 9 U/L (ref 0–44)
AST: 17 U/L (ref 15–41)
Albumin: 4.2 g/dL (ref 3.5–5.0)
Alkaline Phosphatase: 51 U/L (ref 38–126)
Anion gap: 4 — ABNORMAL LOW (ref 5–15)
BUN: 19 mg/dL (ref 6–20)
CO2: 28 mmol/L (ref 22–32)
Calcium: 9 mg/dL (ref 8.9–10.3)
Chloride: 106 mmol/L (ref 98–111)
Creatinine: 0.96 mg/dL (ref 0.44–1.00)
GFR, Estimated: >60 mL/min (ref >60)
Glucose, Bld: 91 mg/dL (ref 70–99)
Potassium: 3.9 mmol/L (ref 3.5–5.1)
Sodium: 138 mmol/L (ref 135–145)
Total Bilirubin: 0.5 mg/dL (ref 0.0–1.2)
Total Protein: 7.1 g/dL (ref 6.5–8.1)

## 2023-06-01 NOTE — Patient Instructions (Signed)
 Deep Effective Breath   1.) Standing, sitting, or laying down, place both hands on the belly. Take a deep breath IN, expanding the belly; then breath OUT, contracting the belly.   Copyright  VHI. All rights reserved.  Axilla to Axilla - Sweep   2.) On both sides make 5 circles in the armpits,   3.) then pump _5__ times from involved armpit across chest to uninvolved armpit, making a pathway. (Left to Right)   Arm Posterior: Elbow to Shoulder - Sweep   4.) Pump _5__ times from back of elbow to top of shoulder.   4.) Then inner to outer upper arm _5_ times, then outer arm again _5_ times.   ARM: Volar Wrist to Elbow - Sweep   5.) Circles at the inner elbow 5 times   6.) Pump or stationary circles _5__ times from wrist to elbow making sure to do both sides of the forearm.   ARM: Dorsum of Hand to Shoulder - Sweep   7.) Pump or stationary circles _5__ times on back of hand including knuckle spaces and individual fingers if needed working up towards the wrist,   8.)then retrace all your steps working back up the forearm, doing both sides; upper outer arm and back to your pathways _2-3_ times each. Then do 5 circles again at uninvolved armpit and involved groin where you started! Good job!! Do __1_ time per day.  Copyright  VHI. All rights reserved.

## 2023-06-02 ENCOUNTER — Other Ambulatory Visit: Payer: Self-pay | Admitting: Hematology and Oncology

## 2023-06-06 LAB — ANTI MULLERIAN HORMONE: ANTI-MULLERIAN HORMONE (AMH): 0.104 ng/mL

## 2023-06-08 ENCOUNTER — Ambulatory Visit: Payer: Self-pay | Admitting: Hematology and Oncology

## 2023-06-15 ENCOUNTER — Encounter: Payer: Self-pay | Admitting: Rehabilitation

## 2023-06-15 ENCOUNTER — Ambulatory Visit: Payer: Self-pay | Attending: Hematology and Oncology | Admitting: Rehabilitation

## 2023-06-15 DIAGNOSIS — Z9189 Other specified personal risk factors, not elsewhere classified: Secondary | ICD-10-CM | POA: Insufficient documentation

## 2023-06-15 DIAGNOSIS — Z483 Aftercare following surgery for neoplasm: Secondary | ICD-10-CM | POA: Insufficient documentation

## 2023-06-15 DIAGNOSIS — Z170 Estrogen receptor positive status [ER+]: Secondary | ICD-10-CM | POA: Diagnosis present

## 2023-06-15 DIAGNOSIS — R293 Abnormal posture: Secondary | ICD-10-CM | POA: Insufficient documentation

## 2023-06-15 DIAGNOSIS — I972 Postmastectomy lymphedema syndrome: Secondary | ICD-10-CM | POA: Insufficient documentation

## 2023-06-15 DIAGNOSIS — C50412 Malignant neoplasm of upper-outer quadrant of left female breast: Secondary | ICD-10-CM | POA: Insufficient documentation

## 2023-06-15 NOTE — Therapy (Signed)
 OUTPATIENT PHYSICAL THERAPY  UPPER EXTREMITY ONCOLOGY   Patient Name: Stephanie [name] MRN: [id number] DOB:04-18-1985, 38 y.o., female Today's Date: 06/15/2023  END OF SESSION:  PT End of Session - 06/15/23 1001     Visit Number 2    Number of Visits 6    Date for PT Re-Evaluation 08/24/23    PT Start Time 0800    PT Stop Time 0821    PT Time Calculation (min) 21 min    Activity Tolerance Patient tolerated treatment well    Behavior During Therapy [facility] for tasks assessed/performed              Past Medical History:  Diagnosis Date   Atypical mole 03/14/2019   mod-left side superior   BRCA gene mutation negative in female    Breast cancer [facility])    Family history of breast cancer    Family history of lung cancer    Family history of rectal cancer    Melanoma (HCC) 10/14/2007   level II- right arm- (EXC)   PONV (postoperative nausea and vomiting)    Past Surgical History:  Procedure Laterality Date   BREAST BIOPSY Left 04/29/2022   US  LT RADIOACTIVE SEED LOC 04/29/2022 [facility]AMMOGRAP[facility]   BREAST LUMPECTOMY WITH RADIOACTIVE SEED AND SENTINEL LYMPH NODE BIOPSY Left 04/30/2022   Procedure: LEFT BREAST LUMPECTOMY WITH RADIOACTIVE SEED AND SENTINEL LYMPH NODE BIOPSY;  Surgeon: Sim Dryer, MD;  Location: [facility];  Service: General;  Laterality: Left;   BREAST RECONSTRUCTION WITH PLACEMENT OF TISSUE EXPANDER AND ALLODERM Bilateral 01/17/2019   Procedure: BILATERAL BREAST RECONSTRUCTION WITH PLACEMENT OF TISSUE EXPANDER AND ALLODERM;  Surgeon: Alger Infield, MD;  Location: [facility];  Service: Plastics;  Laterality: Bilateral;   IR IMAGING GUIDED PORT INSERTION  11/25/2021   IR RADIOLOGIST EVAL & MGMT  11/26/2021   IR REMOVAL TUN ACCESS W/ PORT W/O FL MOD SED  12/09/2022   LESION REMOVAL Left 06/02/2019   Procedure: MINOR EXICISION OF LESION,LAYERED CLOSURE 1 CM;  Surgeon: Alger Infield, MD;  Location: [facility];  Service:  Plastics;  Laterality: Left;   LIPOSUCTION WITH LIPOFILLING Bilateral 06/02/2019   Procedure: LIPOFILLING FROM ABDOMEN TO BILATERAL CHEST;  Surgeon: Alger Infield, MD;  Location: [facility];  Service: Plastics;  Laterality: Bilateral;   melanoma surgery Right 2009   upper right arm   NIPPLE SPARING MASTECTOMY WITH SENTINEL LYMPH NODE BIOPSY Bilateral 01/17/2019   Procedure: BILATERAL NIPPLE SPARING MASTECTOMIES WITH LEFT SENTINEL LYMPH NODE MAPPING;  Surgeon: Sim Dryer, MD;  Location: [facility];  Service: General;  Laterality: Bilateral;   REMOVAL OF BILATERAL TISSUE EXPANDERS WITH PLACEMENT OF BILATERAL BREAST IMPLANTS Bilateral 06/02/2019   Procedure: REMOVAL OF BILATERAL TISSUE EXPANDERS WITH PLACEMENT OF BILATERAL SILICONE BREAST IMPLANTS;  Surgeon: Alger Infield, MD;  Location: [facility];  Service: Plastics;  Laterality: Bilateral;   WISDOM TOOTH EXTRACTION     Patient Active Problem List   Diagnosis Date Noted   Port-A-Cath in place 05/14/2022   Malignant neoplasm of upper-outer quadrant of left breast in female, estrogen receptor positive (HCC) 11/18/2021   Family history of breast cancer    Family history of rectal cancer    Family history of lung cancer    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   Genetic testing 11/03/2018   Hip pain, acute, left 07/30/2016   Neck pain 08/10/2011   Musculoskeletal pain 08/10/2011  PCP: Alanna Hu, MD  REFERRING PROVIDER: Murleen Arms, MD  REFERRING DIAG:  Diagnosis  C50.412,Z17.0 (ICD-10-CM) - Malignant neoplasm of upper-outer quadrant of left breast in female, estrogen receptor positive (HCC)    THERAPY DIAG:  Aftercare following surgery for neoplasm  Malignant neoplasm of upper-outer quadrant of left breast in female, estrogen receptor positive (HCC)  At risk for lymphedema  Abnormal posture  ONSET DATE: 10/2021  Rationale for Evaluation and  Treatment: Rehabilitation  SUBJECTIVE:                                                                                                                                                                                           SUBJECTIVE STATEMENT: I feel like it has settled down.  I haven't had any of the tightness in the past 2 weeks.  I need to order the second sleeve.    PERTINENT HISTORY: treated for left breast cancer DCIS in 2020 where she underwent bilateral nipple sparing mastectomies with implants and Lt SLNB 4 negative nodes rermoved. No radiation completed. Then in 2023 with completion of chemotherapy, left lumpectomy with SLNB on 04/30/22 with 1 negative node removed for a total of 5 removed. Completed radiation.   PAIN:  Are you having pain? Yes - not currently.  I do get intermittent breast pains  The arm doesn't hurt really just an extreme soreness.   PRECAUTIONS: Left lymphedema risk   RED FLAGS: None   WEIGHT BEARING RESTRICTIONS: No  FALLS:  Has patient fallen in last 6 months? 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  And lift together 3-4x per week.    HAND DOMINANCE: right   PRIOR LEVEL OF FUNCTION: Independent  PATIENT GOALS: check out the left arm.     OBJECTIVE: Note: Objective measures were completed at Evaluation unless otherwise noted.  COGNITION: Overall cognitive status: Within functional limits for tasks assessed   PALPATION: No very evident cording even with skin stretch and in various positions but does feel like it sounds like cording subjectively  OBSERVATIONS / OTHER ASSESSMENTS: no edema noted   POSTURE: left pectoralis tightness and implant tightness  UPPER EXTREMITY AROM/PROM: WNL  L-DEX LYMPHEDEMA SCREENING:ON EVAL L-DEX LYMPHEDEMA SCREENING Measurement Type: Unilateral L-DEX MEASUREMENT EXTREMITY: Upper Extremity POSITION : Standing DOMINANT SIDE: Right At Risk  Side: Left BASELINE SCORE (UNILATERAL): 3.2 L-DEX SCORE (UNILATERAL): 8.6 VALUE CHANGE (UNILAT): 5.4  QUICK DASH SURVEY: 6.82%  TREATMENT DATE:  06/15/23 Redid SOZO and discussed subjective symptoms and POC options Pt will continue with sleeve and self MLD and is interested in pursuing a compression pump due to higher level of activity and exercise and intermittent edema.   Will return for scheduled SOZO Discussed risk reduction and gave a new handout  06/01/23 Eval performed Supine education on self MLD of the Left UE with PT performing each step with cueing and handout given.  Omitted inguinal pathway for now.  Reviewed stretches which pt does a good job with already but needs to do more consistently Reviewed use of compression and SOZO score - recommended wearing her sleeve with all exercise at the least vs every day for the next few weeks.  She will get a new sleeve at [facility].      PATIENT EDUCATION:  Education details: per today's note Person educated: Patient Education method: Programmer, multimedia, Demonstration, Actor cues, Verbal cues, and Handouts Education comprehension: verbalized understanding  HOME EXERCISE PROGRAM: Start self MLD, use compression during exercise, stretch more  ASSESSMENT:  CLINICAL IMPRESSION: Patient has had no real change in her SOZO but is not feeling any of the arm symptoms subjectively since the last visit.  She had been wearing her sleeve with exercise and more during the day.  Her SOZO is still in the green but had increased 1 point since 2 weeks ago and is elevated at 5.6 above baseline.  She would benefit from a compression pump due to her history of mastectomy, radiation, and removal of 5 axillary lymph nodes with intermittent swelling in the left arm.  She would benefit from a pump with a chest component due to mastectomy  and implant history to ensure proximal clearance and less congestion.  Pt will resume regular SOZO screenings and will return earlier if needed.    OBJECTIVE IMPAIRMENTS: decreased activity tolerance, decreased knowledge of condition, and increased edema.   ACTIVITY LIMITATIONS: lifting  PARTICIPATION LIMITATIONS: community activity  PERSONAL FACTORS: 1-2 comorbidities: SLNB, radiation are also affecting patient's functional outcome.   REHAB POTENTIAL: Excellent  CLINICAL DECISION MAKING: Stable/uncomplicated  EVALUATION COMPLEXITY: Low  GOALS: Goals reviewed with patient? Yes  SHORT TERM GOALS: Target date: 06/01/23  Pt will be educated on self MLD for the Lt UE Baseline: Goal status: MET   LONG TERM GOALS: Target date: 08/24/23  Pt will use compression for the UE as needed to maintain SOZO in the green normal zone Baseline:  Goal status: MET  2.  Pt will be independent with self MLD for the arm and educated on the pump if needed Baseline:  Goal status: MET  3.  Pt will be educated on activity/exercise modification as needed to control symptoms  Baseline:  Goal status: MET  PLAN:  PT FREQUENCY: every 2 weeks   PT DURATION: 12 weeks  PLANNED INTERVENTIONS: 97164- PT Re-evaluation, 97110-Therapeutic exercises, 97530- Therapeutic activity, 97112- Neuromuscular re-education, 97535- Self Care, [id number]- Manual therapy, Patient/Family education, Therapeutic exercises, Therapeutic activity, Neuromuscular re-education, and Self Care  PLAN FOR NEXT SESSION: recheck SOZO, get new sleeve? Review self MLD in seated - schedule as needed   Encarnacion Harris, PT 06/15/2023, 10:01 AM

## 2023-06-18 ENCOUNTER — Encounter: Payer: Self-pay | Admitting: Hematology and Oncology

## 2023-08-23 ENCOUNTER — Ambulatory Visit: Attending: Hematology and Oncology

## 2023-08-23 VITALS — Wt 156.4 lb

## 2023-08-23 DIAGNOSIS — Z483 Aftercare following surgery for neoplasm: Secondary | ICD-10-CM | POA: Insufficient documentation

## 2023-08-23 NOTE — Therapy (Signed)
 OUTPATIENT PHYSICAL THERAPY SOZO SCREENING NOTE   Patient Name: Stephanie [name] MRN: [id number] DOB:[birth year], 38 y.o., female Today's Date: 08/23/2023  PCP: [name] ORN, MD REFERRING PROVIDER: [name], MD   PT End of Session - 08/23/23 0810     Visit Number 1   # unchanged due to screen only   PT Start Time 0808    PT Stop Time 0812    PT Time Calculation (min) 4 min    Activity Tolerance Patient tolerated treatment well    Behavior During Therapy [facility] for tasks assessed/performed          Past Medical History:  Diagnosis Date   Atypical mole 03/14/2019   mod-left side superior   BRCA gene mutation negative in female    Breast cancer [facility])    Family history of breast cancer    Family history of lung cancer    Family history of rectal cancer    Melanoma (HCC) 10/14/2007   level II- right arm- (EXC)   PONV (postoperative nausea and vomiting)    Past Surgical History:  Procedure Laterality Date   BREAST BIOPSY Left 04/29/2022   US  LT RADIOACTIVE SEED LOC 04/29/2022 [facility]AMMOGR[facility]   BREAST LUMPECTOMY WITH RADIOACTIVE SEED AND SENTINEL LYMPH NODE BIOPSY Left 04/30/2022   Procedure: LEFT BREAST LUMPECTOMY WITH RADIOACTIVE SEED AND SENTINEL LYMPH NODE BIOPSY;  Surgeon: Vanderbilt Ned, MD;  Location: [facility];  Service: General;  Laterality: Left;   BREAST RECONSTRUCTION WITH PLACEMENT OF TISSUE EXPANDER AND ALLODERM Bilateral 01/17/2019   Procedure: BILATERAL BREAST RECONSTRUCTION WITH PLACEMENT OF TISSUE EXPANDER AND ALLODERM;  Surgeon: Arelia Filippo, MD;  Location: [facility];  Service: Plastics;  Laterality: Bilateral;   IR IMAGING GUIDED PORT INSERTION  11/25/2021   IR RADIOLOGIST EVAL & MGMT  11/26/2021   IR REMOVAL TUN ACCESS W/ PORT W/O FL MOD SED  12/09/2022   LESION REMOVAL Left 06/02/2019   Procedure: MINOR EXICISION OF LESION,LAYERED CLOSURE 1 CM;  Surgeon: Arelia Filippo, MD;  Location: [facility];   Service: Plastics;  Laterality: Left;   LIPOSUCTION WITH LIPOFILLING Bilateral 06/02/2019   Procedure: LIPOFILLING FROM ABDOMEN TO BILATERAL CHEST;  Surgeon: Arelia Filippo, MD;  Location: [facility];  Service: Plastics;  Laterality: Bilateral;   melanoma surgery Right 2009   upper right arm   NIPPLE SPARING MASTECTOMY WITH SENTINEL LYMPH NODE BIOPSY Bilateral 01/17/2019   Procedure: BILATERAL NIPPLE SPARING MASTECTOMIES WITH LEFT SENTINEL LYMPH NODE MAPPING;  Surgeon: Vanderbilt Ned, MD;  Location: [facility];  Service: General;  Laterality: Bilateral;   REMOVAL OF BILATERAL TISSUE EXPANDERS WITH PLACEMENT OF BILATERAL BREAST IMPLANTS Bilateral 06/02/2019   Procedure: REMOVAL OF BILATERAL TISSUE EXPANDERS WITH PLACEMENT OF BILATERAL SILICONE BREAST IMPLANTS;  Surgeon: Arelia Filippo, MD;  Location: [facility];  Service: Plastics;  Laterality: Bilateral;   WISDOM TOOTH EXTRACTION     Patient Active Problem List   Diagnosis Date Noted   Port-A-Cath in place 05/14/2022   Malignant neoplasm of upper-outer quadrant of left breast in female, estrogen receptor positive (HCC) 11/18/2021   Family history of breast cancer    Family history of rectal cancer    Family history of lung cancer    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 No radiation completed. Then in 2023 with completion of chemotherapy, left lumpectomy with SLNB on 04/30/22 with 1 negative node removed for a total of 5 removed. Completed radiation.    PRECAUTIONS: left UE Lymphedema risk, None  SUBJECTIVE: Pt returns for SOZO. I've been wearing my sleeve when I work out.  PAIN:  Are you having pain? No  SOZO SCREENING: Patient was assessed today using the SOZO machine to determine the lymphedema index score. This was compared to her baseline score. It was determined that she is within the recommended range when compared to her baseline and no further action is needed at this time. She will continue SOZO screenings. These are done every 3 months for 2 years post operatively followed by every 6 months for 2 years, and then annually.   L-DEX FLOWSHEETS - 08/23/23 0800       L-DEX LYMPHEDEMA SCREENING   Measurement Type Unilateral    L-DEX MEASUREMENT EXTREMITY Upper Extremity    POSITION  Standing    DOMINANT SIDE Right    At Risk Side Left    BASELINE SCORE (UNILATERAL) 3.2    L-DEX SCORE (UNILATERAL) 2.9    VALUE CHANGE (UNILAT) -0.3            Aden Berwyn Caldron, PTA 08/23/2023, 8:12 AM

## 2023-08-31 ENCOUNTER — Encounter: Payer: Self-pay | Admitting: Hematology and Oncology

## 2023-09-01 ENCOUNTER — Telehealth: Payer: Self-pay

## 2023-09-01 ENCOUNTER — Other Ambulatory Visit: Payer: Self-pay

## 2023-09-01 DIAGNOSIS — Z170 Estrogen receptor positive status [ER+]: Secondary | ICD-10-CM

## 2023-09-01 DIAGNOSIS — C50919 Malignant neoplasm of unspecified site of unspecified female breast: Secondary | ICD-10-CM

## 2023-09-01 NOTE — Telephone Encounter (Signed)
 Called pt per her MyChart message regarding concern for new pea-sized lump. She states she noticed the lump 8/15, is hard and not movable. She denies redness/swelling/heat to area and denies any fevers. She has had recurrent breast cancer in this breast; nipple sparing mastectomy first, then a lympex, chemo and rxt when it recurred. She is concerned for recurrence again. We offered her an appt with Morna Kendall, DNP for further evaluation. Per MD, order placed for diagnostic MM, US  and tentative order placed for US  BX if needed. Pt is aware of this and accepted [facility] appt for 09/03/23 at 1340. Email sent to GI breast center to expedite scan for next week.

## 2023-09-03 ENCOUNTER — Inpatient Hospital Stay: Attending: Adult Health | Admitting: Adult Health

## 2023-09-03 VITALS — BP 113/75 | HR 76 | Temp 97.9°F | Resp 17 | Wt 158.0 lb

## 2023-09-03 DIAGNOSIS — Z1732 Human epidermal growth factor receptor 2 negative status: Secondary | ICD-10-CM | POA: Diagnosis not present

## 2023-09-03 DIAGNOSIS — Z801 Family history of malignant neoplasm of trachea, bronchus and lung: Secondary | ICD-10-CM | POA: Insufficient documentation

## 2023-09-03 DIAGNOSIS — Z9221 Personal history of antineoplastic chemotherapy: Secondary | ICD-10-CM | POA: Diagnosis not present

## 2023-09-03 DIAGNOSIS — Z803 Family history of malignant neoplasm of breast: Secondary | ICD-10-CM | POA: Diagnosis not present

## 2023-09-03 DIAGNOSIS — Z923 Personal history of irradiation: Secondary | ICD-10-CM | POA: Diagnosis not present

## 2023-09-03 DIAGNOSIS — Z800 Family history of malignant neoplasm of digestive organs: Secondary | ICD-10-CM | POA: Insufficient documentation

## 2023-09-03 DIAGNOSIS — Z1722 Progesterone receptor negative status: Secondary | ICD-10-CM | POA: Insufficient documentation

## 2023-09-03 DIAGNOSIS — Z9013 Acquired absence of bilateral breasts and nipples: Secondary | ICD-10-CM | POA: Diagnosis not present

## 2023-09-03 DIAGNOSIS — C50412 Malignant neoplasm of upper-outer quadrant of left female breast: Secondary | ICD-10-CM | POA: Diagnosis not present

## 2023-09-03 DIAGNOSIS — Z79810 Long term (current) use of selective estrogen receptor modulators (SERMs): Secondary | ICD-10-CM | POA: Diagnosis not present

## 2023-09-03 DIAGNOSIS — Z170 Estrogen receptor positive status [ER+]: Secondary | ICD-10-CM | POA: Insufficient documentation

## 2023-09-03 NOTE — Progress Notes (Signed)
 [name]:    Stephanie Deward ORN, MD [address] [name] [address]   DIAGNOSIS: Cancer Staging  Malignant neoplasm of upper-outer quadrant of left breast in female, estrogen receptor positive (HCC) Staging form: Breast, AJCC 8th Edition - Clinical: Stage IA (cT1c, cN0, cM0, G2, ER+, PR-, HER2+) - Signed by [name], MD on 11/18/2021 Histologic grading system: 3 grade system    SUMMARY OF ONCOLOGIC HISTORY: Oncology History  Malignant neoplasm of upper-outer quadrant of female breast (HCC) (Resolved)  10/20/2018 Initial Diagnosis   Malignant neoplasm of upper-outer quadrant of left breast in female, estrogen receptor positive (HCC)   10/20/2018 Cancer Staging   Staging form: Breast, AJCC 8th Edition - Clinical stage from 10/20/2018: Stage 0 (cTis (DCIS), cN0, cM0, ER+, PR-) - Signed by [name], MD on 02/13/2020 Stage prefix: Initial diagnosis   11/03/2018 Genetic Testing   Negative genetic testing:  No pathogenic variants detected on the [facility] Multi-Cancer panel, ordered by Dr. Barbette at [facility]. The report date is 11/03/2018.  The Multi-Cancer Panel offered by [facility] includes sequencing and/or deletion duplication testing of the following 84 genes: AIP, ALK, APC, ATM, AXIN2,BAP1,  BARD1, BLM, BMPR1A, BRCA1, BRCA2, BRIP1, CASR, CDC73, CDH1, CDK4, CDKN1B, CDKN1C, CDKN2A (p14ARF), CDKN2A (p16INK4a), CEBPA, CHEK2, CTNNA1, DICER1, DIS3L2, EGFR (c.2369C>T, p.Thr790Met variant only), EPCAM (Deletion/duplication testing only), FH, FLCN, GATA2, GPC3, GREM1 (Promoter region deletion/duplication testing only), HOXB13 (c.251G>A, p.Gly84Glu), HRAS, KIT, MAX, MEN1, MET, MITF (c.952G>A, p.Glu318Lys variant only), MLH1, MSH2, MSH3, MSH6, MUTYH, NBN, NF1, NF2, NTHL1, PALB2, PDGFRA, PHOX2B, PMS2, POLD1, POLE, POT1, PRKAR1A, PTCH1, PTEN, RAD50, RAD51C, RAD51D, RB1, RECQL4, RET, RUNX1, SDHAF2, SDHA (sequence changes only), SDHB, SDHC, SDHD,  SMAD4, SMARCA4, SMARCB1, SMARCE1, STK11, SUFU, TERC, TERT, TMEM127, TP53, TSC1, TSC2, VHL, WRN and WT1.     01/17/2019 Cancer Staging   Staging form: Breast, AJCC 8th Edition - Pathologic stage from 01/17/2019: Stage IA (pT29mi, pN0, cM0, G2, ER+, PR-, HER2-) - Signed by [name], NP on 02/01/2019   07/01/2022 - 08/11/2022 Radiation Therapy   Plan Name: CW_L_BH_BO Site: Chest Wall, Left Technique: 3D Mode: Photon Dose Per Fraction: 1.8 Gy Prescribed Dose (Delivered / Prescribed): 25.2 Gy / 25.2 Gy Prescribed Fxs (Delivered / Prescribed): 14 / 14   Plan Name: CW_L_BH Site: Chest Wall, Left Technique: 3D Mode: Photon Dose Per Fraction: 1.8 Gy Prescribed Dose (Delivered / Prescribed): 25.2 Gy / 25.2 Gy Prescribed Fxs (Delivered / Prescribed): 14 / 14   08/2022 -  Anti-estrogen oral therapy   Tamoxifen    Malignant neoplasm of upper-outer quadrant of left breast in female, estrogen receptor positive (HCC)  10/21/2021 Breast US    Breast ultrasound of the palpable mass showed hypoechoic oval vascular mass with indistinct margins measuring 1.5 x 1 cm x 1.4 cm.  No abnormal lymph nodes found in the left axilla.  She had ultrasound-guided biopsy.  MRI scheduled for tomorrow.   10/29/2021 Pathology Results   Surgical pathology from the left breast needle core biopsy at 130 o'clock showed invasive ductal carcinoma, overall grade 2, prognostics ER 70% positive weak to moderate staining, PR 0% negative, Ki-67 of 80% and HER2 3+   11/18/2021 Cancer Staging   Staging form: Breast, AJCC 8th Edition - Clinical: Stage IA (cT1c, cN0, cM0, G2, ER+, PR-, HER2+) - Signed by [name], MD on 11/18/2021 Histologic grading system: 3 grade system   11/26/2021 - 03/16/2022 Chemotherapy   Patient is on Treatment Plan :  BREAST  Docetaxel  + Carboplatin  + Trastuzumab  + Pertuzumab   (TCHP) q21d      04/03/2022 - 11/27/2022 Chemotherapy   Patient is on Treatment Plan : BREAST Trastuzumab  + Pertuzumab   q21d x 11 cycles     04/30/2022 Surgery   Left breast lumpectomy: no residual cancer identified, margins negative, treatment effect present, 1SLN negative for cancer.   06/24/2022 - 08/03/2022 Radiation Therapy   Adjuvant radiation therapy   08/2022 -  Anti-estrogen oral therapy   Tamoxifen  daily     CURRENT THERAPY: Tamoxifen   INTERVAL HISTORY:  Discussed the use of AI scribe software for clinical note transcription with the patient, who gave verbal consent to proceed.  History of Present Illness Stephanie [name] is a 38 year old female with a history of left breast invasive ductal carcinoma who presents with a breast concern.  She discovered a palpable area last Friday night, located more in her chest than the breast, specifically in the 'transition zone'. It feels small and atypical, not well circumscribed, unlike a typical cyst.  Her most recent breast MRI in March showed no evidence of malignancy. She is scheduled for an ultrasound on Monday morning to further evaluate the area. She no longer undergoes diagnostic mammograms due her bilateral mastectomies and implants.   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  MEDICAL HISTORY: Past Medical History:  Diagnosis Date   Atypical mole 03/14/2019   mod-left side superior   BRCA gene mutation negative in female    Breast cancer [facility])    Family history of breast cancer    Family history of lung cancer    Family history of rectal cancer    Melanoma (HCC)  10/14/2007   level II- right arm- (EXC)   PONV (postoperative nausea and vomiting)     SURGICAL HISTORY: Past Surgical History:  Procedure Laterality Date   BREAST BIOPSY Left 04/29/2022   US  LT RADIOACTIVE SEED LOC 04/29/2022 [facility]AMMOGRAP[facility]   BREAST LUMPECTOMY WITH RADIOACTIVE SEED AND SENTINEL LYMPH NODE BIOPSY Left 04/30/2022   Procedure: LEFT BREAST LUMPECTOMY WITH RADIOACTIVE SEED AND SENTINEL LYMPH NODE BIOPSY;  Surgeon: Vanderbilt Ned, MD;  Location: [facility];  Service: General;  Laterality: Left;   BREAST RECONSTRUCTION WITH PLACEMENT OF TISSUE EXPANDER AND ALLODERM Bilateral 01/17/2019   Procedure: BILATERAL BREAST RECONSTRUCTION WITH PLACEMENT OF TISSUE EXPANDER AND ALLODERM;  Surgeon: Arelia Filippo, MD;  Location: [facility];  Service: Plastics;  Laterality: Bilateral;   IR IMAGING GUIDED PORT INSERTION  11/25/2021   IR RADIOLOGIST EVAL & MGMT  11/26/2021   IR REMOVAL TUN ACCESS W/ PORT W/O FL MOD SED  12/09/2022   LESION REMOVAL Left 06/02/2019   Procedure: MINOR EXICISION OF LESION,LAYERED CLOSURE 1 CM;  Surgeon: Arelia Filippo, MD;  Location:  SURGERY CENTER;  Service: Plastics;  Laterality: Left;   LIPOSUCTION WITH LIPOFILLING Bilateral 06/02/2019   Procedure: LIPOFILLING FROM ABDOMEN TO BILATERAL CHEST;  Surgeon: Arelia Filippo, MD;  Location:  [facility];  Service: Plastics;  Laterality: Bilateral;   melanoma surgery Right 2009   upper right arm   NIPPLE SPARING MASTECTOMY WITH SENTINEL LYMPH NODE BIOPSY Bilateral 01/17/2019   Procedure: BILATERAL NIPPLE SPARING MASTECTOMIES WITH LEFT SENTINEL LYMPH NODE MAPPING;  Surgeon: Vanderbilt Ned, MD;  Location: [facility];  Service: General;  Laterality: Bilateral;   REMOVAL OF BILATERAL TISSUE EXPANDERS WITH PLACEMENT OF BILATERAL BREAST IMPLANTS Bilateral 06/02/2019   Procedure: REMOVAL OF BILATERAL TISSUE EXPANDERS WITH PLACEMENT OF BILATERAL SILICONE  BREAST IMPLANTS;  Surgeon: Arelia Filippo, MD;  Location: [facility];  Service: Plastics;  Laterality: Bilateral;   WISDOM TOOTH EXTRACTION      SOCIAL HISTORY: Social History   Socioeconomic History   Marital status: Married    Spouse name: Not on file   Number of children: Not on file   Years of education: Not on file   Highest education level: Not on file  Occupational History   Not on file  Tobacco Use   Smoking status: Never   Smokeless tobacco: Never  Vaping Use   Vaping status: Never Used  Substance and Sexual Activity   Alcohol use: Yes    Comment: occasional   Drug use: Never   Sexual activity: Yes    Birth control/protection: None  Other Topics Concern   Not on file  Social History Narrative   Not on file   Social Drivers of Health   Financial Resource Strain: Not on file  Food Insecurity: No Food Insecurity (05/25/2022)   Hunger Vital Sign    Worried About Running Out of Food in the Last Year: Never true    Ran Out of Food in the Last Year: Never true  Transportation Needs: No Transportation Needs (05/25/2022)   PRAPARE - Administrator, Civil Service (Medical): No    Lack of Transportation (Non-Medical): No  Physical Activity: Not on file  Stress: Not on file  Social Connections: Not on file  Intimate Partner Violence: Not At Risk (05/25/2022)   Humiliation, Afraid, Rape, and Kick questionnaire    Fear of Current or Ex-Partner: No    Emotionally Abused: No    Physically Abused: No    Sexually Abused: No    FAMILY HISTORY: Family History  Problem Relation Age of Onset   Hypertension Mother    Heart disease Father    Breast cancer Maternal Aunt 14 - 69   Breast cancer Maternal Aunt 47 - 59   Lung cancer Paternal Aunt        diagnosed late 40s   Breast cancer Maternal Grandmother 38 - 69   Breast cancer Paternal Grandmother 73 - 61   Lung cancer Paternal Grandmother        thought to be breast cancer mets   Rectal  cancer Paternal Grandmother        diagnosed 73s   Lung cancer Paternal Grandfather        diagnosed 82s    Review of Systems  Constitutional:  Negative for appetite change, chills, fatigue, fever and unexpected weight change.  HENT:   Negative for hearing loss, lump/mass and trouble swallowing.   Eyes:  Negative for eye problems and icterus.  Respiratory:  Negative for chest tightness, cough and shortness of breath.   Cardiovascular:  Negative for chest pain, leg swelling and palpitations.  Gastrointestinal:  Negative for abdominal distention, abdominal pain, constipation, diarrhea, nausea and vomiting.  Endocrine: Negative for hot flashes.  Genitourinary:  Negative for difficulty urinating.   Musculoskeletal:  Negative for arthralgias.  Skin:  Negative for itching and rash.  Neurological:  Negative for dizziness, extremity weakness, headaches and numbness.  Hematological:  Negative for adenopathy. Does not bruise/bleed easily.  Psychiatric/Behavioral:  Negative for depression. The patient is not nervous/anxious.       PHYSICAL EXAMINATION  Vitals:   09/03/23 1351  BP: 113/75  Pulse: 76  Resp: 17  Temp: 97.9 F (36.6 C)  SpO2: 100%    Physical Exam Constitutional:      General: She is not in acute distress.    Appearance: Normal appearance. She is not toxic-appearing.  HENT:     Head: Normocephalic and atraumatic.  Eyes:     General: No scleral icterus. Chest:     Comments: Left upper chest wall with small 0.75 cm asymmetric hard nodule, no left axillary adenopathy noted Musculoskeletal:        General: No swelling.     Cervical back: Neck supple.  Skin:    General: Skin is warm and dry.     Findings: No rash.  Neurological:     General: No focal deficit present.     Mental Status: She is alert.  Psychiatric:        Mood and Affect: Mood normal.        Behavior: Behavior normal.      ASSESSMENT and THERAPY PLAN:   Assessment and Plan Assessment &  Plan History of left breast invasive ductal carcinoma Left breast invasive ductal carcinoma (s/p bilateral mastectomies in 2021 for DCIS), ER and HER2 positive, stage 1A. Treated with TCHP chemotherapy, maintenance Herceptin /Perjeta , lumpectomy, adjuvant radiation. Continues on tamoxifen . Concern for recurrence due to new mass, but breast MRI in 03/2023 negative for malignancy.  Breast/chest wall mass, evaluation in progress Atypical mass in chest-breast transition zone. Differential includes fat necrosis or cyst. Concern for recurrence due to breast cancer history, but recent MRI negative for malignancy. - Proceed with scheduled ultrasound on Monday morning to evaluate the mass. -  Discuss potential need for biopsy based on ultrasound findings.     All questions were answered. The patient knows to call the clinic with any problems, questions or concerns. We can certainly see the patient much sooner if necessary.  Total encounter time:20 minutes*in face-to-face visit time, chart review, lab review, care coordination, order entry, and documentation of the encounter time.    Morna Kendall, NP 09/03/23 1:54 PM Medical Oncology and Hematology [facility] [address] [place], [place] [address] Tel. [phone]    Fax. [phone]  *Total Encounter Time as defined by the Centers for Medicare and Medicaid Services includes, in addition to the face-to-face time of a patient visit (documented in the note above) non-face-to-face time: obtaining and reviewing outside history, ordering and reviewing medications, tests or procedures, care coordination (communications with other health care professionals or caregivers) and documentation in the medical record.

## 2023-09-06 ENCOUNTER — Ambulatory Visit
Admission: RE | Admit: 2023-09-06 | Discharge: 2023-09-06 | Disposition: A | Discharge status: Home / Self Care | Source: Ambulatory Visit | Attending: Hematology and Oncology | Admitting: Hematology and Oncology

## 2023-09-06 ENCOUNTER — Other Ambulatory Visit: Payer: Self-pay | Admitting: Hematology and Oncology

## 2023-09-06 DIAGNOSIS — N6320 Unspecified lump in the left breast, unspecified quadrant: Secondary | ICD-10-CM

## 2023-09-06 DIAGNOSIS — Z170 Estrogen receptor positive status [ER+]: Secondary | ICD-10-CM

## 2023-09-06 DIAGNOSIS — C50919 Malignant neoplasm of unspecified site of unspecified female breast: Secondary | ICD-10-CM

## 2023-09-07 ENCOUNTER — Ambulatory Visit
Admission: RE | Admit: 2023-09-07 | Discharge: 2023-09-07 | Disposition: A | Discharge status: Home / Self Care | Source: Ambulatory Visit | Attending: Hematology and Oncology | Admitting: Hematology and Oncology

## 2023-09-07 DIAGNOSIS — Z170 Estrogen receptor positive status [ER+]: Secondary | ICD-10-CM

## 2023-09-07 DIAGNOSIS — N6320 Unspecified lump in the left breast, unspecified quadrant: Secondary | ICD-10-CM

## 2023-09-07 DIAGNOSIS — C50919 Malignant neoplasm of unspecified site of unspecified female breast: Secondary | ICD-10-CM

## 2023-09-07 HISTORY — PX: BREAST BIOPSY: SHX20

## 2023-09-08 LAB — SURGICAL PATHOLOGY

## 2023-09-16 ENCOUNTER — Encounter: Payer: Self-pay | Admitting: Hematology and Oncology

## 2023-09-21 ENCOUNTER — Inpatient Hospital Stay: Attending: Adult Health | Admitting: Hematology and Oncology

## 2023-09-21 ENCOUNTER — Encounter: Payer: Self-pay | Admitting: Hematology and Oncology

## 2023-09-21 VITALS — BP 101/58 | HR 60 | Temp 98.4°F | Resp 15 | Wt 159.4 lb

## 2023-09-21 DIAGNOSIS — Z1731 Human epidermal growth factor receptor 2 positive status: Secondary | ICD-10-CM | POA: Insufficient documentation

## 2023-09-21 DIAGNOSIS — Z170 Estrogen receptor positive status [ER+]: Secondary | ICD-10-CM | POA: Diagnosis not present

## 2023-09-21 DIAGNOSIS — Z79810 Long term (current) use of selective estrogen receptor modulators (SERMs): Secondary | ICD-10-CM | POA: Diagnosis not present

## 2023-09-21 DIAGNOSIS — C50412 Malignant neoplasm of upper-outer quadrant of left female breast: Secondary | ICD-10-CM | POA: Diagnosis present

## 2023-09-21 NOTE — Progress Notes (Signed)
 [facility] Cancer Follow up:    Stephanie Deward ORN, MD [address] [place] [address]   DIAGNOSIS:  Cancer Staging  Malignant neoplasm of upper-outer quadrant of left breast in female, estrogen receptor positive (HCC) Staging form: Breast, AJCC 8th Edition - Clinical: Stage IA (cT1c, cN0, cM0, G2, ER+, PR-, HER2+) - Signed by [name], MD on 11/18/2021 Histologic grading system: 3 grade system    SUMMARY OF ONCOLOGIC HISTORY: Oncology History  Malignant neoplasm of upper-outer quadrant of female breast (HCC) (Resolved)  10/20/2018 Initial Diagnosis   Malignant neoplasm of upper-outer quadrant of left breast in female, estrogen receptor positive (HCC)   10/20/2018 Cancer Staging   Staging form: Breast, AJCC 8th Edition - Clinical stage from 10/20/2018: Stage 0 (cTis (DCIS), cN0, cM0, ER+, PR-) - Signed by [name], MD on 02/13/2020 Stage prefix: Initial diagnosis   11/03/2018 Genetic Testing   Negative genetic testing:  No pathogenic variants detected on the [facility] Multi-Cancer panel, ordered by Dr. Barbette at [facility]. The report date is 11/03/2018.  The Multi-Cancer Panel offered by [facility] includes sequencing and/or deletion duplication testing of the following 84 genes: AIP, ALK, APC, ATM, AXIN2,BAP1,  BARD1, BLM, BMPR1A, BRCA1, BRCA2, BRIP1, CASR, CDC73, CDH1, CDK4, CDKN1B, CDKN1C, CDKN2A (p14ARF), CDKN2A (p16INK4a), CEBPA, CHEK2, CTNNA1, DICER1, DIS3L2, EGFR (c.2369C>T, p.Thr790Met variant only), EPCAM (Deletion/duplication testing only), FH, FLCN, GATA2, GPC3, GREM1 (Promoter region deletion/duplication testing only), HOXB13 (c.251G>A, p.Gly84Glu), HRAS, KIT, MAX, MEN1, MET, MITF (c.952G>A, p.Glu318Lys variant only), MLH1, MSH2, MSH3, MSH6, MUTYH, NBN, NF1, NF2, NTHL1, PALB2, PDGFRA, PHOX2B, PMS2, POLD1, POLE, POT1, PRKAR1A, PTCH1, PTEN, RAD50, RAD51C, RAD51D, RB1, RECQL4, RET, RUNX1, SDHAF2, SDHA (sequence changes only), SDHB, SDHC, SDHD,  SMAD4, SMARCA4, SMARCB1, SMARCE1, STK11, SUFU, TERC, TERT, TMEM127, TP53, TSC1, TSC2, VHL, WRN and WT1.     01/17/2019 Cancer Staging   Staging form: Breast, AJCC 8th Edition - Pathologic stage from 01/17/2019: Stage IA (pT75mi, pN0, cM0, G2, ER+, PR-, HER2-) - Signed by [name], NP on 02/01/2019   07/01/2022 - 08/11/2022 Radiation Therapy   Plan Name: CW_L_BH_BO Site: Chest Wall, Left Technique: 3D Mode: Photon Dose Per Fraction: 1.8 Gy Prescribed Dose (Delivered / Prescribed): 25.2 Gy / 25.2 Gy Prescribed Fxs (Delivered / Prescribed): 14 / 14   Plan Name: CW_L_BH Site: Chest Wall, Left Technique: 3D Mode: Photon Dose Per Fraction: 1.8 Gy Prescribed Dose (Delivered / Prescribed): 25.2 Gy / 25.2 Gy Prescribed Fxs (Delivered / Prescribed): 14 / 14   08/2022 -  Anti-estrogen oral therapy   Tamoxifen    Malignant neoplasm of upper-outer quadrant of left breast in female, estrogen receptor positive (HCC)  10/21/2021 Breast US    Breast ultrasound of the palpable mass showed hypoechoic oval vascular mass with indistinct margins measuring 1.5 x 1 cm x 1.4 cm.  No abnormal lymph nodes found in the left axilla.  She had ultrasound-guided biopsy.  MRI scheduled for tomorrow.   10/29/2021 Pathology Results   Surgical pathology from the left breast needle core biopsy at 130 o'clock showed invasive ductal carcinoma, overall grade 2, prognostics ER 70% positive weak to moderate staining, PR 0% negative, Ki-67 of 80% and HER2 3+   11/18/2021 Cancer Staging   Staging form: Breast, AJCC 8th Edition - Clinical: Stage IA (cT1c, cN0, cM0, G2, ER+, PR-, HER2+) - Signed by [name], MD on 11/18/2021 Histologic grading system: 3 grade system   11/26/2021 - 03/16/2022 Chemotherapy   Patient is on Treatment Plan :  BREAST  Docetaxel  + Carboplatin  + Trastuzumab  + Pertuzumab   (TCHP) q21d      04/03/2022 - 11/27/2022 Chemotherapy   Patient is on Treatment Plan : BREAST Trastuzumab  + Pertuzumab   q21d x 11 cycles     04/30/2022 Surgery   Left breast lumpectomy: no residual cancer identified, margins negative, treatment effect present, 1SLN negative for cancer.   06/24/2022 - 08/03/2022 Radiation Therapy   Adjuvant radiation therapy   08/2022 -  Anti-estrogen oral therapy   Tamoxifen  daily     CURRENT THERAPY: Tamoxifen   INTERVAL HISTORY:  Discussed the use of AI scribe software for clinical note transcription with the patient, who gave verbal consent to proceed.  History of Present Illness Stephanie [name] is a 38 year old female with breast cancer who presents with worsening neck pain and radiculopathy.  She has been experiencing new neck pain over the last couple of months, radiating down her left arm and causing numbness. She recalls having neck pain years ago, attributed to a bulging disc seen on an MRI. At that time, she managed the pain with occasional flare-ups that felt muscular. She has not yet sought further evaluation for this recent exacerbation.  She has a history of breast cancer and is currently taking tamoxifen , which she has reduced to half the dose. She reports slight improvements in mood swings and fatigue, but continues to experience significant brain fog. Recently, she has started experiencing night sweats and occasional hot flashes, which she associates with perimenopausal symptoms. She has not menstruated since chemotherapy, except for a single episode of bleeding in July, which felt like a menstrual cycle. Her AMH levels were reported as very low.  She mentions a past issue with a breast mass where imaging and pathology were discordant. A second opinion is being sought to determine the appropriate course of action. She expresses concern about the recurrence of breast-related issues despite previous interventions.  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 She describes this change as a 'one 54' and appreciates the reduced stress and pressure compared to her previous job.  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  MEDICAL HISTORY: Past Medical History:  Diagnosis Date   Atypical mole 03/14/2019   mod-left side superior   BRCA gene mutation negative in female    Breast cancer [facility])    Family history of breast cancer    Family history of lung cancer    Family history of rectal cancer    Melanoma (HCC) 10/14/2007   level II- right arm- (EXC)   PONV (postoperative nausea and vomiting)     SURGICAL HISTORY: Past Surgical History:  Procedure Laterality Date   BREAST BIOPSY Left 04/29/2022   US  LT RADIOACTIVE SEED LOC 04/29/2022 [facility] MAMMOGRAPHY   BREAST BIOPSY Left 09/07/2023   US  LT BREAST BX W LOC DEV 1ST LESION IMG BX SPEC US   GUIDE 09/07/2023 [facility] MAMMOGRAPHY   BREAST LUMPECTOMY WITH RADIOACTIVE SEED AND SENTINEL LYMPH NODE BIOPSY Left 04/30/2022   Procedure: LEFT BREAST LUMPECTOMY WITH RADIOACTIVE SEED AND SENTINEL LYMPH NODE BIOPSY;  Surgeon: Vanderbilt Ned, MD;  Location: [facility];  Service: General;  Laterality: Left;   BREAST RECONSTRUCTION WITH PLACEMENT OF TISSUE EXPANDER AND ALLODERM Bilateral 01/17/2019   Procedure: BILATERAL BREAST RECONSTRUCTION WITH PLACEMENT OF TISSUE EXPANDER AND ALLODERM;  Surgeon: Arelia Filippo, MD;  Location: [facility];  Service: Plastics;  Laterality: Bilateral;   IR IMAGING GUIDED  PORT INSERTION  11/25/2021   IR RADIOLOGIST EVAL & MGMT  11/26/2021   IR REMOVAL TUN ACCESS W/ PORT W/O FL MOD SED  12/09/2022   LESION REMOVAL Left 06/02/2019   Procedure: MINOR EXICISION OF LESION,LAYERED CLOSURE 1 CM;  Surgeon: Arelia Filippo, MD;  Location: [facility];  Service: Plastics;  Laterality: Left;   LIPOSUCTION WITH LIPOFILLING Bilateral 06/02/2019   Procedure: LIPOFILLING FROM ABDOMEN TO BILATERAL CHEST;  Surgeon: Arelia Filippo, MD;  Location: [facility];  Service: Plastics;  Laterality: Bilateral;   melanoma surgery Right 2009   upper right arm   NIPPLE SPARING MASTECTOMY WITH SENTINEL LYMPH NODE BIOPSY Bilateral 01/17/2019   Procedure: BILATERAL NIPPLE SPARING MASTECTOMIES WITH LEFT SENTINEL LYMPH NODE MAPPING;  Surgeon: Vanderbilt Ned, MD;  Location: [facility];  Service: General;  Laterality: Bilateral;   REMOVAL OF BILATERAL TISSUE EXPANDERS WITH PLACEMENT OF BILATERAL BREAST IMPLANTS Bilateral 06/02/2019   Procedure: REMOVAL OF BILATERAL TISSUE EXPANDERS WITH PLACEMENT OF BILATERAL SILICONE BREAST IMPLANTS;  Surgeon: Arelia Filippo, MD;  Location: [facility];  Service: Plastics;  Laterality: Bilateral;   WISDOM TOOTH EXTRACTION      SOCIAL HISTORY: Social History   Socioeconomic History   Marital status: Married    Spouse name: Not on file   Number of children: Not on file   Years of education: Not on file   Highest education level: Not on file  Occupational History   Not on file  Tobacco Use   Smoking status: Never   Smokeless tobacco: Never  Vaping Use   Vaping status: Never Used  Substance and Sexual Activity   Alcohol use: Yes    Comment: occasional   Drug use: Never   Sexual activity: Yes    Birth control/protection: None  Other Topics Concern   Not on file  Social History Narrative   Not on file   Social Drivers of Health   Financial Resource Strain: Not on file  Food Insecurity:  No Food Insecurity (05/25/2022)   Hunger Vital Sign    Worried About Running Out of Food in the Last Year: Never true    Ran Out of Food in the Last Year: Never true  Transportation Needs: No Transportation Needs (05/25/2022)   PRAPARE - Administrator, Civil Service (Medical): No    Lack of Transportation (Non-Medical): No  Physical Activity: Not on file  Stress: Not on file  Social Connections: Not on file  Intimate Partner Violence: Not At Risk (05/25/2022)   Humiliation, Afraid, Rape, and Kick questionnaire    Fear of Current or Ex-Partner: No    Emotionally Abused: No    Physically Abused: No    Sexually Abused: No    FAMILY HISTORY: Family History  Problem Relation Age of Onset   Hypertension Mother    Heart disease Father    Breast cancer Maternal Aunt 57 -  69   Breast cancer Maternal Aunt 50 - 59   Lung cancer Paternal Aunt        diagnosed late 63s   Breast cancer Maternal Grandmother 59 - 69   Breast cancer Paternal Grandmother 74 - 46   Lung cancer Paternal Grandmother        thought to be breast cancer mets   Rectal cancer Paternal Grandmother        diagnosed 79s   Lung cancer Paternal Grandfather        diagnosed 72s    Review of Systems  Constitutional:  Negative for appetite change, chills, fatigue, fever and unexpected weight change.  HENT:   Negative for hearing loss, lump/mass and trouble swallowing.   Eyes:  Negative for eye problems and icterus.  Respiratory:  Negative for chest tightness, cough and shortness of breath.   Cardiovascular:  Negative for chest pain, leg swelling and palpitations.  Gastrointestinal:  Negative for abdominal distention, abdominal pain, constipation, diarrhea, nausea and vomiting.  Endocrine: Negative for hot flashes.  Genitourinary:  Negative for difficulty urinating.   Musculoskeletal:  Negative for arthralgias.  Skin:  Negative for itching and rash.  Neurological:  Negative for dizziness, extremity weakness,  headaches and numbness.  Hematological:  Negative for adenopathy. Does not bruise/bleed easily.  Psychiatric/Behavioral:  Negative for depression. The patient is not nervous/anxious.       PHYSICAL EXAMINATION  Vitals:   09/21/23 0836  BP: (!) 101/58  Pulse: 60  Resp: 15  Temp: 98.4 F (36.9 C)  SpO2: 100%     Physical Exam Constitutional:      General: She is not in acute distress.    Appearance: Normal appearance. She is not toxic-appearing.  HENT:     Head: Normocephalic and atraumatic.  Eyes:     General: No scleral icterus. Chest:     Comments: Left upper chest wall with small 0.75 cm asymmetric hard nodule, no left axillary adenopathy noted Musculoskeletal:        General: No swelling.     Cervical back: Neck supple.  Skin:    General: Skin is warm and dry.     Findings: No rash.  Neurological:     General: No focal deficit present.     Mental Status: She is alert.  Psychiatric:        Mood and Affect: Mood normal.        Behavior: Behavior normal.      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 Treated with TCHP chemotherapy, maintenance Herceptin /Perjeta , lumpectomy, adjuvant radiation. Continues on tamoxifen . Concern for recurrence due to new mass, followed up with Dr Vanderbilt.  Cervical radiculopathy with left arm numbness and pain Chronic neck pain with recent exacerbation, radiating down the left arm with numbness, suggesting possible worsening of a bulging disc causing nerve compression. Differential includes worsening cervical disc herniation causing radiculopathy. - Refer to orthopedic specialist for evaluation and management. - She will get back to us  with the name.  Breast mass under surveillance in patient with history of breast cancer on  tamoxifen  Breast mass with discordant imaging and pathology findings. Considered benign but requires further evaluation due to history of breast cancer. Discussed presenting case at [facility] breast tumor board for additional opinions. - Await second opinion from [facility] regarding imaging and pathology  per Dr Vanderbilt.  Perimenopausal symptoms associated with tamoxifen  and prior chemotherapy Experiencing perimenopausal symptoms with slight improvement on reduced tamoxifen  dose. Persistent brain fog. Recent night sweats and hot flashes may indicate progression towards menopause, possibly exacerbated by prior chemotherapy and tamoxifen . - Continue tamoxifen  at half dose. - Monitor symptoms; if hot flashes worsen, consider venlafaxine for vasomotor symptoms.  History of abnormal uterine bleeding and endometrial polyp, post-D&C Abnormal uterine bleeding with previous D&C. Recent bleeding episode in July, resembling menstrual cycle, but no further bleeding. AMH levels indicate low ovarian reserve, suggesting possible early menopause. Discussed AMH's indication of low ovarian reserve,  - Monitor for any further episodes of abnormal bleeding.  Time spent: 30 min.  *Total Encounter Time as defined by the Centers for Medicare and Medicaid Services includes, in addition to the face-to-face time of a patient visit (documented in the note above) non-face-to-face time: obtaining and reviewing outside history, ordering and reviewing medications, tests or procedures, care coordination (communications with other health care professionals or caregivers) and documentation in the medical record.

## 2023-10-12 ENCOUNTER — Encounter: Payer: Self-pay | Admitting: Hematology and Oncology

## 2023-11-19 ENCOUNTER — Telehealth: Payer: Self-pay

## 2023-11-19 NOTE — Telephone Encounter (Signed)
 Enter in error

## 2023-11-23 ENCOUNTER — Encounter: Payer: Self-pay | Admitting: Hematology and Oncology

## 2023-12-02 ENCOUNTER — Ambulatory Visit: Admitting: Hematology and Oncology

## 2023-12-06 ENCOUNTER — Ambulatory Visit: Payer: Self-pay | Attending: Hematology and Oncology

## 2023-12-06 VITALS — Wt 161.2 lb

## 2023-12-06 DIAGNOSIS — Z483 Aftercare following surgery for neoplasm: Secondary | ICD-10-CM | POA: Insufficient documentation

## 2023-12-06 NOTE — Therapy (Signed)
 OUTPATIENT PHYSICAL THERAPY SOZO SCREENING NOTE   Patient Name: Stephanie [name] MRN: [id number] DOB:08-08-1985, 38 y.o., female Today's Date: 12/06/2023  PCP: [name] ORN, MD REFERRING PROVIDER: [name], MD   PT End of Session - 12/06/23 0805     Visit Number 1   # unchanged due to screen only   PT Start Time 0803    PT Stop Time 0806    PT Time Calculation (min) 3 min    Activity Tolerance Patient tolerated treatment well    Behavior During Therapy [facility] for tasks assessed/performed          Past Medical History:  Diagnosis Date   Atypical mole 03/14/2019   mod-left side superior   BRCA gene mutation negative in female    Breast cancer [facility])    Family history of breast cancer    Family history of lung cancer    Family history of rectal cancer    Melanoma (HCC) 10/14/2007   level II- right arm- (EXC)   PONV (postoperative nausea and vomiting)    Past Surgical History:  Procedure Laterality Date   BREAST BIOPSY Left 04/29/2022   US  LT RADIOACTIVE SEED LOC 04/29/2022 [facility]AMMOGRAP[facility]   BREAST BIOPSY Left 09/07/2023   US  LT BREAST BX W LOC DEV 1ST LESION IMG BX SPEC US  GUIDE 09/07/2023 [facility]AMMOGR[facility]   BREAST LUMPECTOMY WITH RADIOACTIVE SEED AND SENTINEL LYMPH NODE BIOPSY Left 04/30/2022   Procedure: LEFT BREAST LUMPECTOMY WITH RADIOACTIVE SEED AND SENTINEL LYMPH NODE BIOPSY;  Surgeon: Vanderbilt Ned, MD;  Location: [facility];  Service: General;  Laterality: Left;   BREAST RECONSTRUCTION WITH PLACEMENT OF TISSUE EXPANDER AND ALLODERM Bilateral 01/17/2019   Procedure: BILATERAL BREAST RECONSTRUCTION WITH PLACEMENT OF TISSUE EXPANDER AND ALLODERM;  Surgeon: Arelia Filippo, MD;  Location: [facility];  Service: Plastics;  Laterality: Bilateral;   IR IMAGING GUIDED PORT INSERTION  11/25/2021   IR RADIOLOGIST EVAL & MGMT  11/26/2021   IR REMOVAL TUN ACCESS W/ PORT W/O FL MOD SED  12/09/2022   LESION REMOVAL Left 06/02/2019    Procedure: MINOR EXICISION OF LESION,LAYERED CLOSURE 1 CM;  Surgeon: Arelia Filippo, MD;  Location: [facility];  Service: Plastics;  Laterality: Left;   LIPOSUCTION WITH LIPOFILLING Bilateral 06/02/2019   Procedure: LIPOFILLING FROM ABDOMEN TO BILATERAL CHEST;  Surgeon: Arelia Filippo, MD;  Location: [facility];  Service: Plastics;  Laterality: Bilateral;   melanoma surgery Right 2009   upper right arm   NIPPLE SPARING MASTECTOMY WITH SENTINEL LYMPH NODE BIOPSY Bilateral 01/17/2019   Procedure: BILATERAL NIPPLE SPARING MASTECTOMIES WITH LEFT SENTINEL LYMPH NODE MAPPING;  Surgeon: Vanderbilt Ned, MD;  Location: [facility];  Service: General;  Laterality: Bilateral;   REMOVAL OF BILATERAL TISSUE EXPANDERS WITH PLACEMENT OF BILATERAL BREAST IMPLANTS Bilateral 06/02/2019   Procedure: REMOVAL OF BILATERAL TISSUE EXPANDERS WITH PLACEMENT OF BILATERAL SILICONE BREAST IMPLANTS;  Surgeon: Arelia Filippo, MD;  Location: [facility];  Service: Plastics;  Laterality: Bilateral;   WISDOM TOOTH EXTRACTION     Patient Active Problem List   Diagnosis Date Noted   Port-A-Cath in place 05/14/2022   Malignant neoplasm of upper-outer quadrant of left breast in female, estrogen receptor positive (HCC) 11/18/2021   Family history of breast cancer    Family history of rectal cancer    Family history of lung cancer    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 No radiation completed. Then in 2023 with completion of  chemotherapy, left lumpectomy with SLNB on 04/30/22 with 1 negative node removed for a total of 5 removed. Completed radiation.   PRECAUTIONS: left UE Lymphedema risk, None  SUBJECTIVE: Pt returns for SOZO.   PAIN:  Are you having pain? No  SOZO SCREENING: Patient was assessed today using the SOZO machine to determine the lymphedema index score. This was compared to her baseline score. It was determined that she is within the recommended range when compared to her baseline and no further action is needed at this time. She will continue SOZO screenings. These are done every 3 months for 2 years post operatively followed by every 6 months for 2 years, and then annually.   L-DEX FLOWSHEETS - 12/06/23 0800       L-DEX LYMPHEDEMA SCREENING   Measurement Type Unilateral    L-DEX MEASUREMENT EXTREMITY Upper Extremity    POSITION  Standing    DOMINANT SIDE Right    At Risk Side Left    BASELINE SCORE (UNILATERAL) 3.2    L-DEX SCORE (UNILATERAL) 2.9    VALUE CHANGE (UNILAT) -0.3          P: Cont 3 months, at next SOZO she may want to transition to 6 months as she will be near her 2 years from surgery.   Aden Berwyn Caldron, PTA 12/06/2023, 8:07 AM

## 2024-01-12 ENCOUNTER — Inpatient Hospital Stay: Attending: Hematology and Oncology | Admitting: Hematology and Oncology

## 2024-01-12 VITALS — BP 98/60 | HR 60 | Temp 98.4°F | Resp 18 | Wt 160.7 lb

## 2024-01-12 DIAGNOSIS — Z79810 Long term (current) use of selective estrogen receptor modulators (SERMs): Secondary | ICD-10-CM | POA: Diagnosis not present

## 2024-01-12 DIAGNOSIS — Z9013 Acquired absence of bilateral breasts and nipples: Secondary | ICD-10-CM | POA: Insufficient documentation

## 2024-01-12 DIAGNOSIS — Z1731 Human epidermal growth factor receptor 2 positive status: Secondary | ICD-10-CM | POA: Diagnosis not present

## 2024-01-12 DIAGNOSIS — Z170 Estrogen receptor positive status [ER+]: Secondary | ICD-10-CM

## 2024-01-12 DIAGNOSIS — C50412 Malignant neoplasm of upper-outer quadrant of left female breast: Secondary | ICD-10-CM | POA: Diagnosis not present

## 2024-01-12 DIAGNOSIS — R0602 Shortness of breath: Secondary | ICD-10-CM

## 2024-01-12 DIAGNOSIS — R252 Cramp and spasm: Secondary | ICD-10-CM | POA: Diagnosis not present

## 2024-01-12 DIAGNOSIS — C50919 Malignant neoplasm of unspecified site of unspecified female breast: Secondary | ICD-10-CM

## 2024-01-12 DIAGNOSIS — Z1722 Progesterone receptor negative status: Secondary | ICD-10-CM | POA: Diagnosis present

## 2024-01-12 NOTE — Addendum Note (Signed)
 Addended by: Rayvin Abid, NAGA VENKATA KALIPRAVEENA on: 01/12/2024 10:09 AM   Modules accepted: Orders

## 2024-01-12 NOTE — Progress Notes (Signed)
 [facility] Cancer Follow up:    [name] ORN, MD [address] [place] [address]   DIAGNOSIS:  Cancer Staging  Malignant neoplasm of upper-outer quadrant of left breast in female, estrogen receptor positive (HCC) Staging form: Breast, AJCC 8th Edition - Clinical: Stage IA (cT1c, cN0, cM0, G2, ER+, PR-, HER2+) - Signed by [name], MD on 11/18/2021 Histologic grading system: 3 grade system   SUMMARY OF ONCOLOGIC HISTORY: Oncology History  Malignant neoplasm of upper-outer quadrant of female breast (HCC) (Resolved)  10/20/2018 Initial Diagnosis   Malignant neoplasm of upper-outer quadrant of left breast in female, estrogen receptor positive (HCC)   10/20/2018 Cancer Staging   Staging form: Breast, AJCC 8th Edition - Clinical stage from 10/20/2018: Stage 0 (cTis (DCIS), cN0, cM0, ER+, PR-) - Signed by [name], MD on 02/13/2020 Stage prefix: Initial diagnosis   11/03/2018 Genetic Testing   Negative genetic testing:  No pathogenic variants detected on the [facility] Multi-Cancer panel, ordered by Dr. Barbette at [facility]. The report date is 11/03/2018.  The Multi-Cancer Panel offered by [facility] includes sequencing and/or deletion duplication testing of the following 84 genes: AIP, ALK, APC, ATM, AXIN2,BAP1,  BARD1, BLM, BMPR1A, BRCA1, BRCA2, BRIP1, CASR, CDC73, CDH1, CDK4, CDKN1B, CDKN1C, CDKN2A (p14ARF), CDKN2A (p16INK4a), CEBPA, CHEK2, CTNNA1, DICER1, DIS3L2, EGFR (c.2369C>T, p.Thr790Met variant only), EPCAM (Deletion/duplication testing only), FH, FLCN, GATA2, GPC3, GREM1 (Promoter region deletion/duplication testing only), HOXB13 (c.251G>A, p.Gly84Glu), HRAS, KIT, MAX, MEN1, MET, MITF (c.952G>A, p.Glu318Lys variant only), MLH1, MSH2, MSH3, MSH6, MUTYH, NBN, NF1, NF2, NTHL1, PALB2, PDGFRA, PHOX2B, PMS2, POLD1, POLE, POT1, PRKAR1A, PTCH1, PTEN, RAD50, RAD51C, RAD51D, RB1, RECQL4, RET, RUNX1, SDHAF2, SDHA (sequence changes only), SDHB, SDHC, SDHD,  SMAD4, SMARCA4, SMARCB1, SMARCE1, STK11, SUFU, TERC, TERT, TMEM127, TP53, TSC1, TSC2, VHL, WRN and WT1.     01/17/2019 Cancer Staging   Staging form: Breast, AJCC 8th Edition - Pathologic stage from 01/17/2019: Stage IA (pT33mi, pN0, cM0, G2, ER+, PR-, HER2-) - Signed by [name], NP on 02/01/2019   07/01/2022 - 08/11/2022 Radiation Therapy   Plan Name: CW_L_BH_BO Site: Chest Wall, Left Technique: 3D Mode: Photon Dose Per Fraction: 1.8 Gy Prescribed Dose (Delivered / Prescribed): 25.2 Gy / 25.2 Gy Prescribed Fxs (Delivered / Prescribed): 14 / 14   Plan Name: CW_L_BH Site: Chest Wall, Left Technique: 3D Mode: Photon Dose Per Fraction: 1.8 Gy Prescribed Dose (Delivered / Prescribed): 25.2 Gy / 25.2 Gy Prescribed Fxs (Delivered / Prescribed): 14 / 14   08/2022 -  Anti-estrogen oral therapy   Tamoxifen    Malignant neoplasm of upper-outer quadrant of left breast in female, estrogen receptor positive (HCC)  10/21/2021 Breast US    Breast ultrasound of the palpable mass showed hypoechoic oval vascular mass with indistinct margins measuring 1.5 x 1 cm x 1.4 cm.  No abnormal lymph nodes found in the left axilla.  She had ultrasound-guided biopsy.  MRI scheduled for tomorrow.   10/29/2021 Pathology Results   Surgical pathology from the left breast needle core biopsy at 130 o'clock showed invasive ductal carcinoma, overall grade 2, prognostics ER 70% positive weak to moderate staining, PR 0% negative, Ki-67 of 80% and HER2 3+   11/18/2021 Cancer Staging   Staging form: Breast, AJCC 8th Edition - Clinical: Stage IA (cT1c, cN0, cM0, G2, ER+, PR-, HER2+) - Signed by [name], MD on 11/18/2021 Histologic grading system: 3 grade system   11/26/2021 - 03/16/2022 Chemotherapy   Patient is on Treatment Plan :  BREAST  Docetaxel  + Carboplatin  + Trastuzumab  + Pertuzumab   (TCHP) q21d      04/03/2022 - 11/27/2022 Chemotherapy   Patient is on Treatment Plan : BREAST Trastuzumab  + Pertuzumab   q21d x 11 cycles     04/30/2022 Surgery   Left breast lumpectomy: no residual cancer identified, margins negative, treatment effect present, 1SLN negative for cancer.   06/24/2022 - 08/03/2022 Radiation Therapy   Adjuvant radiation therapy   08/2022 -  Anti-estrogen oral therapy   Tamoxifen  daily     CURRENT THERAPY: Tamoxifen   INTERVAL HISTORY:  Discussed the use of AI scribe software for clinical note transcription with the patient, who gave verbal consent to proceed.  History of Present Illness Stephanie [name] is a 38 year old female with recurrent ER+ left breast cancer, status post chemotherapy and bilateral mastectomy, currently on tamoxifen , who presents with persistent fatigue and exercise intolerance.  She reports ongoing significant fatigue and inability to return to her pre-cancer baseline, with associated frustration regarding the persistent impact of cancer therapy. Despite participating in high intensity interval training three to four times weekly, she experiences marked exercise intolerance, with heart rate reaching the 180s during activity and pronounced dyspnea on exertion, including with routine activities such as stair climbing. She denies chest pain or pressure but notes increased shortness of breath and inability to keep pace during hiking at altitude, which is a decline from her prior baseline.  She has been on tamoxifen  since August 2024, currently at a half dose, with uncertain impact on her symptoms. Muscle cramps have slightly improved, but she continues to experience frequent myalgias and increased arthralgias or tendinopathy, particularly post-exercise and when not stretching. She previously took magnesium  glycinate with mild relief but discontinued due to running out. She has also been taking vitamin D3 plus K2 for several months without clear benefit.  Her menses, which had ceased with tamoxifen  after resuming post-chemotherapy, have now returned regularly since  July. She expresses concern about perimenopausal symptoms and requests comprehensive laboratory evaluation, including hormone levels, thyroid  function, vitamin D, B12, cholesterol, and A1c, as these have not been recently assessed. She is considering establishing care with a primary care provider or gynecologist with menopause expertise.  She expresses ongoing anxiety regarding possible cancer recurrence, particularly osseous metastasis, due to persistent pain. She has not undergone PET imaging but is undergoing serial [employer] MRD testing every six months, which has not detected recurrence. She reports that she has a breast mass that was previously evaluated and told to be benign, which is currently not causing her symptoms.  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  MEDICAL HISTORY: Past Medical History:  Diagnosis Date   Atypical mole 03/14/2019   mod-left side superior   BRCA gene mutation negative in female    Breast cancer [facility])    Family history of breast cancer    Family history of lung cancer    Family history of rectal cancer    Melanoma (HCC) 10/14/2007  level II- right arm- (EXC)   PONV (postoperative nausea and vomiting)     SURGICAL HISTORY: Past Surgical History:  Procedure Laterality Date   BREAST BIOPSY Left 04/29/2022   US  LT RADIOACTIVE SEED LOC 04/29/2022 [facility]   BREAST BIOPSY Left 09/07/2023   US  LT BREAST BX W LOC DEV 1ST LESION IMG BX SPEC US  GUIDE 09/07/2023 [facility]    BREAST LUMPECTOMY WITH RADIOACTIVE SEED AND SENTINEL LYMPH NODE BIOPSY Left 04/30/2022   Procedure: LEFT BREAST LUMPECTOMY WITH RADIOACTIVE SEED AND SENTINEL LYMPH NODE BIOPSY;  Surgeon: Vanderbilt Ned, MD;  Location: [facility];  Service: General;  Laterality: Left;   BREAST RECONSTRUCTION WITH PLACEMENT OF TISSUE EXPANDER AND ALLODERM Bilateral 01/17/2019   Procedure: BILATERAL BREAST RECONSTRUCTION WITH PLACEMENT OF TISSUE EXPANDER AND ALLODERM;  Surgeon: Arelia Filippo, MD;  Location: [facility];  Service: Plastics;  Laterality: Bilateral;   IR IMAGING GUIDED PORT INSERTION  11/25/2021   IR RADIOLOGIST EVAL & MGMT  11/26/2021   IR REMOVAL TUN ACCESS W/ PORT W/O FL MOD SED  12/09/2022   LESION REMOVAL Left 06/02/2019   Procedure: MINOR EXICISION OF LESION,LAYERED CLOSURE 1 CM;  Surgeon: Arelia Filippo, MD;  Location: [facility];  Service: Plastics;  Laterality: Left;   LIPOSUCTION WITH LIPOFILLING Bilateral 06/02/2019   Procedure: LIPOFILLING FROM ABDOMEN TO BILATERAL CHEST;  Surgeon: Arelia Filippo, MD;  Location: [facility];  Service: Plastics;  Laterality: Bilateral;   melanoma surgery Right 2009   upper right arm   NIPPLE SPARING MASTECTOMY WITH SENTINEL LYMPH NODE BIOPSY Bilateral 01/17/2019   Procedure: BILATERAL NIPPLE SPARING MASTECTOMIES WITH LEFT SENTINEL LYMPH NODE MAPPING;  Surgeon: Vanderbilt Ned, MD;  Location:  SURGERY CENTER;  Service: General;  Laterality: Bilateral;   REMOVAL OF BILATERAL TISSUE EXPANDERS WITH PLACEMENT OF BILATERAL BREAST IMPLANTS Bilateral 06/02/2019   Procedure: REMOVAL OF BILATERAL TISSUE EXPANDERS WITH PLACEMENT OF BILATERAL SILICONE BREAST IMPLANTS;  Surgeon: Arelia Filippo, MD;  Location:  SURGERY CENTER;  Service: Plastics;  Laterality: Bilateral;   WISDOM TOOTH EXTRACTION      SOCIAL HISTORY: Social History   Socioeconomic History   Marital status: Married     Spouse name: Not on file   Number of children: Not on file   Years of education: Not on file   Highest education level: Not on file  Occupational History   Not on file  Tobacco Use   Smoking status: Never   Smokeless tobacco: Never  Vaping Use   Vaping status: Never Used  Substance and Sexual Activity   Alcohol use: Yes    Comment: occasional   Drug use: Never   Sexual activity: Yes    Birth control/protection: None  Other Topics Concern   Not on file  Social History Narrative   Not on file   Social Drivers of Health   Tobacco Use: Low Risk  (09/17/2023)   Received from [facility]   Patient History    Smoking Tobacco Use: Never    Smokeless Tobacco Use: Never    Passive Exposure: Not on file  Financial Resource Strain: Not on file  Food Insecurity: No Food Insecurity (05/25/2022)   Hunger Vital Sign    Worried About Running Out of Food in the Last Year: Never true    Ran Out of Food in the Last Year: Never true  Transportation Needs: No Transportation Needs (05/25/2022)   PRAPARE - Transportation    Lack of  Transportation (Medical): No    Lack of Transportation (Non-Medical): No  Physical Activity: Not on file  Stress: Not on file  Social Connections: Not on file  Intimate Partner Violence: Not At Risk (05/25/2022)   Humiliation, Afraid, Rape, and Kick questionnaire    Fear of Current or Ex-Partner: No    Emotionally Abused: No    Physically Abused: No    Sexually Abused: No  Depression (PHQ2-9): Low Risk (09/21/2023)   Depression (PHQ2-9)    PHQ-2 Score: 0  Alcohol Screen: Low Risk (05/25/2022)   Alcohol Screen    Last Alcohol Screening Score (AUDIT): 0  Housing: Unknown (09/17/2023)   Received from [facility]   Epic    Unable to Pay for Housing in the Last Year: Not on file    Number of Times Moved in the Last Year: Not on file    At any time in the past 12 months, were you homeless or living in a shelter (including now)?: No   Utilities: Not At Risk (05/25/2022)   [facility] Utilities    Threatened with loss of utilities: No  Health Literacy: Not on file    FAMILY HISTORY: Family History  Problem Relation Age of Onset   Hypertension Mother    Heart disease Father    Breast cancer Maternal Aunt 58 - 69   Breast cancer Maternal Aunt 61 - 59   Lung cancer Paternal Aunt        diagnosed late 25s   Breast cancer Maternal Grandmother 28 - 69   Breast cancer Paternal Grandmother 75 - 91   Lung cancer Paternal Grandmother        thought to be breast cancer mets   Rectal cancer Paternal Grandmother        diagnosed 60s   Lung cancer Paternal Grandfather        diagnosed 60s    Review of Systems  Constitutional:  Negative for appetite change, chills, fatigue, fever and unexpected weight change.  HENT:   Negative for hearing loss, lump/mass and trouble swallowing.   Eyes:  Negative for eye problems and icterus.  Respiratory:  Negative for chest tightness, cough and shortness of breath.   Cardiovascular:  Negative for chest pain, leg swelling and palpitations.  Gastrointestinal:  Negative for abdominal distention, abdominal pain, constipation, diarrhea, nausea and vomiting.  Endocrine: Negative for hot flashes.  Genitourinary:  Negative for difficulty urinating.   Musculoskeletal:  Negative for arthralgias.  Skin:  Negative for itching and rash.  Neurological:  Negative for dizziness, extremity weakness, headaches and numbness.  Hematological:  Negative for adenopathy. Does not bruise/bleed easily.  Psychiatric/Behavioral:  Negative for depression. The patient is not nervous/anxious.       PHYSICAL EXAMINATION  Vitals:   01/12/24 0905  BP: 98/60  Pulse: 60  Resp: 18  Temp: 98.4 F (36.9 C)  SpO2: 100%     Physical Exam Constitutional:      General: She is not in acute distress.    Appearance: Normal appearance. She is not toxic-appearing.  HENT:     Head: Normocephalic and atraumatic.  Eyes:      General: No scleral icterus. Chest:     Comments: Left upper chest wall nodule stable. Bilateral breasts with implants in place. No palpable masses. No regional adenopathy Musculoskeletal:        General: No swelling.     Cervical back: Neck supple.  Skin:    General: Skin is warm and dry.  Findings: No rash.  Neurological:     General: No focal deficit present.     Mental Status: She is alert.  Psychiatric:        Mood and Affect: Mood normal.        Behavior: Behavior normal.      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 Treated with TCHP chemotherapy, maintenance Herceptin /Perjeta , lumpectomy, adjuvant radiation. Continues on tamoxifen .  - Continue MRD testing every six months. - Maintained scheduled oncology follow-up; next visit planned for June. - No concern for recurrence on ROS or PE  Cardiac symptoms following cancer therapy Reports exertional dyspnea, tachycardia with activity, reduced exercise tolerance. Concern for cardiotoxicity from prior chemotherapy. No angina or chest pressure. Previous echocardiograms normal, but symptoms persist and worsened. If cardiac evaluation unremarkable, deconditioning likely contributing. - Echocardiogram ordered. - Referral placed to cardio-oncology for further evaluation and possible stress testing. - Advised to monitor for new or worsening cardiac symptoms, including chest pain or pressure. - Discussed that if cardiac evaluation is normal, focus should shift to physical reconditioning with a personal trainer.  Musculoskeletal symptoms associated with tamoxifen  therapy Experiences muscle cramps and arthralgia/tendinopathy, likely multifactorial from tamoxifen  and prior chemotherapy. Tamoxifen  dose reduction provided minimal improvement.  Regular exercise ongoing, but significant post-exertional soreness persists, possibly exacerbated by insufficient stretching. Magnesium  supplementation provided mild relief.  - Discussed trial of holding tamoxifen  for 2-4 weeks to assess impact on musculoskeletal symptoms, mood, and fatigue. - Advised to increase stretching as part of exercise regimen. - Continued magnesium  supplementation as needed for muscle cramps. - Monitor for improvement in symptoms during tamoxifen  hold. - Engage in shared decision making regarding long-term tamoxifen  therapy based on symptom response and recurrence risk.  Menstrual irregularity and perimenopausal symptoms post-chemotherapy Experienced menstrual irregularity and perimenopausal symptoms following chemotherapy and tamoxifen , but menses resumed since July. Bone density scan not indicated due to ongoing menstruation and tamoxifen  use. - Advised to establish care with a primary care provider for comprehensive evaluation for fatigue - Bone density scan not indicated at this time due to ongoing menstruation and tamoxifen  doesn't cause bone density loss.  Time spent: 40 min.  *Total Encounter Time as defined by the Centers for Medicare and Medicaid Services includes, in addition to the face-to-face time of a patient visit (documented in the note above) non-face-to-face time: obtaining and reviewing outside history, ordering and reviewing medications, tests or procedures, care coordination (communications with other health care professionals or caregivers) and documentation in the medical record.

## 2024-01-28 ENCOUNTER — Encounter: Payer: Self-pay | Admitting: Hematology and Oncology

## 2024-01-28 ENCOUNTER — Encounter (HOSPITAL_COMMUNITY): Payer: Self-pay

## 2024-01-28 ENCOUNTER — Other Ambulatory Visit: Payer: Self-pay | Admitting: *Deleted

## 2024-01-28 ENCOUNTER — Ambulatory Visit (HOSPITAL_COMMUNITY)

## 2024-01-28 DIAGNOSIS — R0602 Shortness of breath: Secondary | ICD-10-CM

## 2024-01-28 DIAGNOSIS — C50412 Malignant neoplasm of upper-outer quadrant of left female breast: Secondary | ICD-10-CM

## 2024-01-28 DIAGNOSIS — Z801 Family history of malignant neoplasm of trachea, bronchus and lung: Secondary | ICD-10-CM

## 2024-01-31 ENCOUNTER — Ambulatory Visit: Admitting: Cardiovascular Disease

## 2024-02-10 ENCOUNTER — Encounter (HOSPITAL_BASED_OUTPATIENT_CLINIC_OR_DEPARTMENT_OTHER): Payer: Self-pay | Admitting: Pulmonary Disease

## 2024-02-10 ENCOUNTER — Ambulatory Visit (HOSPITAL_BASED_OUTPATIENT_CLINIC_OR_DEPARTMENT_OTHER): Admitting: Pulmonary Disease

## 2024-02-10 VITALS — BP 116/75 | HR 60 | Ht 66.0 in | Wt 163.0 lb

## 2024-02-10 DIAGNOSIS — R0602 Shortness of breath: Secondary | ICD-10-CM

## 2024-02-10 DIAGNOSIS — C50912 Malignant neoplasm of unspecified site of left female breast: Secondary | ICD-10-CM

## 2024-02-10 NOTE — Patient Instructions (Addendum)
 Shortness of breath --ORDER pulmonary function test. Follow-up with me after PFT --If normal will consider CPET

## 2024-02-10 NOTE — Progress Notes (Signed)
 "   Subjective:   PATIENT ID: Stephanie [name] GENDER: female DOB: 08/23/1985, MRN: [id number]  Chief Complaint  Patient presents with   Establish Care    Reason for Visit: New consult for shortness of breath     Stephanie [name] is a 39 y.o. 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   Social History: Never smoker   Environmental exposures:  Hx chemotherapy and radaition    02/10/2024 Discussed the use of AI scribe software for clinical note transcription with the patient, who gave verbal consent to proceed.  History of Present Illness Stephanie [name] is a 39 year old female with a history of breast cancer who presents with shortness of breath.  She initially experienced shortness of breath during chemotherapy treatments in 2023, characterized by becoming easily winded and experiencing a rapid increase in heart rate during moderate physical activities. Despite regular participation in fitness classes and gym workouts, her heart rate reaches its maximum more quickly than before.  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 She underwent chemotherapy, immunotherapy, 28 rounds of radiation, and a lumpectomy. She began taking tamoxifen  in August 2024, after which she noticed a worsening of her shortness of breath and other symptoms.  In the fall of 2024, during a hike in [place] , she experienced severe shortness of breath and a high heart rate, necessitating frequent stops. She also reports a dry, barking cough primarily in the evenings and at night, which occasionally wakes her up, especially when lying flat.  She has been taking tamoxifen  since August 2024 and reduced  the dose about a year ago due to side effects, including brain fog, fatigue, and severe muscle cramps. She takes magnesium  to help with muscle cramps, which have improved slightly. Despite ongoing physical activity, her muscles fatigue more quickly than before.  She has a history of breast cancer, initially diagnosed in 2020, leading to a double mastectomy and reconstruction in early 2021. She remained cancer-free until the recurrence in 2023. She has been undergoing regular echocardiograms every three months during immunotherapy, all of which were normal.  No consistent wheezing or coughing, but occasional dry, barking cough primarily in the evenings and at night. Occasional puffiness in the left ankle after prolonged standing or riding.     Past Medical History:  Diagnosis Date   Atypical mole 03/14/2019   mod-left side superior   BRCA gene mutation negative in female    Breast cancer [facility])    Family history of breast cancer    Family history of lung cancer    Family history of rectal cancer    Melanoma (HCC) 10/14/2007   level II- right arm- ([facility])   PONV (postoperative nausea and vomiting)      Family History  Problem Relation Age of Onset   Hypertension Mother    Heart disease Father    Breast cancer Maternal Aunt 6 - 69   Breast cancer Maternal Aunt 25 - 59   Lung cancer Paternal Aunt        diagnosed late 31s   Breast cancer Maternal Grandmother 38 - 69   Breast cancer Paternal Grandmother 57 - 5   Lung cancer Paternal Grandmother  thought to be breast cancer mets   Rectal cancer Paternal Grandmother        diagnosed 82s   Lung cancer Paternal Grandfather        diagnosed 52s     Social History   Occupational History   Not on file  Tobacco Use   Smoking status: Never   Smokeless tobacco: Never  Vaping Use   Vaping status: Never Used  Substance and Sexual Activity   Alcohol use: Yes    Comment: occasional   Drug use: Never   Sexual activity: Yes     Birth control/protection: None    Allergies[1]   Outpatient Medications Prior to Visit  Medication Sig Dispense Refill   magnesium  30 MG tablet Take 30 mg by mouth 2 (two) times daily.     meclizine  (ANTIVERT ) 25 MG tablet Take 25 mg by mouth 3 (three) times daily as needed (Vertigo/Motion Sickness).     tamoxifen  (NOLVADEX ) 20 MG tablet TAKE ONE (1) TABLET BY MOUTH EVERY DAY 90 tablet 3   No facility-administered medications prior to visit.    ROS   Objective:   Vitals:   02/10/24 0824  BP: 116/75  Pulse: 60  SpO2: 97%  Weight: 163 lb (73.9 kg)  Height: 5' 6 (1.676 m)   SpO2: 97 %  Physical Exam GENERAL: Well appearing, no acute distress. HEAD EARS NOSE THROAT: Normocephalic, atraumatic. EYES: Extraocular movements intact, no scleral icterus. RESPIRATORY: Clear to auscultation bilaterally, no crackles, wheezing or rales. CARDIOVASCULAR: Regular rate and rhythm, no murmurs, rubs, or gallops, no jugular venous distention. EXTREMITIES: No edema, no tenderness, clubbing. NEUROLOGICAL: Alert and oriented x4, cranial nerves II-XII grossly intact. PSYCHIATRIC: Normal mood, normal affect.   Data Reviewed:  Imaging: CT CAP 12/03/21 - Visualized lung parenchyma with no pulmonary nodules, masses, infiltrate, effusion or pneumothorax. Abdomen and pelvis negative for acute abnormalities or mets  PFT: None on file  Labs:    Latest Ref Rng & Units 06/01/2023   12:00 PM 11/24/2022    9:39 AM 11/06/2022    9:23 AM  CBC  WBC 4.0 - 10.5 K/uL 8.9  7.8  7.0   Hemoglobin 12.0 - 15.0 g/dL 86.8  88.0  88.0   Hematocrit 36.0 - 46.0 % 39.9  36.3  36.7   Platelets 150 - 400 K/uL 285  210  166    Cardiac:  Echo 11/30/22 - EF 60-65% No WMA, normal RV size and function. No valvular abnormalities seen     Assessment & Plan:   Discussion: 39 y.o. 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 Gradually worsening exercise tolerance that began in 2023 but worsened in 08/2022. Unable to hike with the same level of stamina as she did one year ago.   Assessment & Plan Shortness of breath --ORDER pulmonary function test --If normal will consider CPET  Health Maintenance  There is no immunization history on file for this patient. CT Lung Screen - Not qualified. No significant tobacco history  Orders Placed This Encounter  Procedures   Pulmonary function test    Standing Status:   Future    Expiration Date:   02/09/2025    Where should this test be performed?:   Outpatient [facility]  What type of PFT is being ordered?:   Full PFT  No orders of the defined types were placed in this encounter.   Return for after PFT.  I have spent a total time of 30-minutes on the day of the appointment reviewing prior documentation, coordinating care and discussing medical diagnosis and plan with the patient/family. Imaging, labs and tests included in this note have been reviewed and interpreted independently by me. This note is generated using [employer] programming. Patient/family has given consent.  Daelon Dunivan Slater Staff, MD [facility] 02/10/2024 9:04 AM        [1]  Allergies Allergen Reactions   Oxycodone  Nausea Only    Severe nausea   "

## 2024-02-11 ENCOUNTER — Ambulatory Visit (INDEPENDENT_AMBULATORY_CARE_PROVIDER_SITE_OTHER)

## 2024-02-11 ENCOUNTER — Encounter (HOSPITAL_BASED_OUTPATIENT_CLINIC_OR_DEPARTMENT_OTHER): Payer: Self-pay | Admitting: Pulmonary Disease

## 2024-02-11 ENCOUNTER — Ambulatory Visit (INDEPENDENT_AMBULATORY_CARE_PROVIDER_SITE_OTHER): Admitting: Pulmonary Disease

## 2024-02-11 VITALS — BP 105/72 | HR 56 | Ht 66.0 in | Wt 163.0 lb

## 2024-02-11 DIAGNOSIS — R0602 Shortness of breath: Secondary | ICD-10-CM | POA: Diagnosis not present

## 2024-02-11 NOTE — Progress Notes (Signed)
 Full PFT performed today.

## 2024-02-11 NOTE — Patient Instructions (Signed)
 Full PFT performed today.

## 2024-02-14 LAB — PULMONARY FUNCTION TEST
DL/VA % pred: 107 %
DL/VA: 4.7 ml/min/mmHg/L
DLCO unc % pred: 99 %
DLCO unc: 23.37 ml/min/mmHg
FEF 25-75 Post: 3.64 L/s
FEF 25-75 Pre: 3.25 L/s
FEF2575-%Change-Post: 12 %
FEF2575-%Pred-Post: 112 %
FEF2575-%Pred-Pre: 100 %
FEV1-%Change-Post: 4 %
FEV1-%Pred-Post: 103 %
FEV1-%Pred-Pre: 99 %
FEV1-Post: 3.34 L
FEV1-Pre: 3.19 L
FEV1FVC-%Change-Post: 3 %
FEV1FVC-%Pred-Pre: 99 %
FEV6-%Change-Post: 1 %
FEV6-%Pred-Post: 102 %
FEV6-%Pred-Pre: 100 %
FEV6-Post: 3.95 L
FEV6-Pre: 3.9 L
FEV6FVC-%Pred-Post: 101 %
FEV6FVC-%Pred-Pre: 101 %
FVC-%Change-Post: 1 %
FVC-%Pred-Post: 100 %
FVC-%Pred-Pre: 98 %
FVC-Post: 3.95 L
Post FEV1/FVC ratio: 84 %
Post FEV6/FVC ratio: 100 %
Pre FEV1/FVC ratio: 82 %
Pre FEV6/FVC Ratio: 100 %
RV % pred: 116 %
RV: 1.96 L
TLC % pred: 108 %
TLC: 5.79 L

## 2024-03-06 ENCOUNTER — Ambulatory Visit: Attending: Hematology and Oncology

## 2024-03-21 ENCOUNTER — Ambulatory Visit: Admitting: Hematology and Oncology

## 2024-06-13 ENCOUNTER — Inpatient Hospital Stay: Payer: Self-pay | Admitting: Hematology and Oncology
